# Patient Record
Sex: Female | Born: 1950
Health system: Southern US, Community
[De-identification: ages and names within clinical notes are randomized; demographics above are authoritative.]

## PROBLEM LIST (undated history)

## (undated) DIAGNOSIS — Z01818 Encounter for other preprocedural examination: Secondary | ICD-10-CM

## (undated) DIAGNOSIS — I10 Essential (primary) hypertension: Secondary | ICD-10-CM

## (undated) DIAGNOSIS — R05 Cough: Secondary | ICD-10-CM

## (undated) DIAGNOSIS — J45909 Unspecified asthma, uncomplicated: Secondary | ICD-10-CM

## (undated) DIAGNOSIS — M1990 Unspecified osteoarthritis, unspecified site: Secondary | ICD-10-CM

## (undated) DIAGNOSIS — K8020 Calculus of gallbladder without cholecystitis without obstruction: Secondary | ICD-10-CM

## (undated) DIAGNOSIS — E785 Hyperlipidemia, unspecified: Secondary | ICD-10-CM

## (undated) DIAGNOSIS — J189 Pneumonia, unspecified organism: Secondary | ICD-10-CM

## (undated) DIAGNOSIS — I639 Cerebral infarction, unspecified: Secondary | ICD-10-CM

## (undated) DIAGNOSIS — Z87442 Personal history of urinary calculi: Secondary | ICD-10-CM

## (undated) DIAGNOSIS — G479 Sleep disorder, unspecified: Secondary | ICD-10-CM

## (undated) HISTORY — DX: Hyperlipidemia, unspecified: E78.5

## (undated) HISTORY — DX: Encounter for other preprocedural examination: Z01.818

## (undated) HISTORY — DX: Unspecified asthma, uncomplicated: J45.909

## (undated) HISTORY — PX: OTHER SURGICAL HISTORY: SHX169

## (undated) HISTORY — PX: TUBAL LIGATION: SHX77

---

## 2001-08-13 ENCOUNTER — Emergency Department (HOSPITAL_COMMUNITY): Admission: EM | Admit: 2001-08-13 | Discharge: 2001-08-13 | Payer: Self-pay | Admitting: Emergency Medicine

## 2001-08-16 ENCOUNTER — Emergency Department (HOSPITAL_COMMUNITY): Admission: EM | Admit: 2001-08-16 | Discharge: 2001-08-16 | Payer: Self-pay | Admitting: Emergency Medicine

## 2002-08-26 ENCOUNTER — Inpatient Hospital Stay (HOSPITAL_COMMUNITY): Admission: AD | Admit: 2002-08-26 | Discharge: 2002-08-26 | Payer: Self-pay | Admitting: *Deleted

## 2003-03-02 ENCOUNTER — Ambulatory Visit (HOSPITAL_COMMUNITY): Admission: RE | Admit: 2003-03-02 | Discharge: 2003-03-02 | Payer: Self-pay | Admitting: Family Medicine

## 2003-08-02 ENCOUNTER — Emergency Department (HOSPITAL_COMMUNITY): Admission: EM | Admit: 2003-08-02 | Discharge: 2003-08-02 | Payer: Self-pay | Admitting: Emergency Medicine

## 2005-02-08 ENCOUNTER — Emergency Department (HOSPITAL_COMMUNITY): Admission: EM | Admit: 2005-02-08 | Discharge: 2005-02-09 | Payer: Self-pay | Admitting: Emergency Medicine

## 2005-12-03 ENCOUNTER — Emergency Department (HOSPITAL_COMMUNITY): Admission: EM | Admit: 2005-12-03 | Discharge: 2005-12-03 | Payer: Self-pay | Admitting: Emergency Medicine

## 2005-12-05 ENCOUNTER — Encounter (INDEPENDENT_AMBULATORY_CARE_PROVIDER_SITE_OTHER): Payer: Self-pay | Admitting: Nurse Practitioner

## 2005-12-07 DIAGNOSIS — IMO0002 Reserved for concepts with insufficient information to code with codable children: Secondary | ICD-10-CM | POA: Insufficient documentation

## 2005-12-18 ENCOUNTER — Encounter (INDEPENDENT_AMBULATORY_CARE_PROVIDER_SITE_OTHER): Payer: Self-pay | Admitting: Nurse Practitioner

## 2005-12-27 ENCOUNTER — Encounter (INDEPENDENT_AMBULATORY_CARE_PROVIDER_SITE_OTHER): Payer: Self-pay | Admitting: Nurse Practitioner

## 2007-06-03 ENCOUNTER — Emergency Department (HOSPITAL_COMMUNITY): Admission: EM | Admit: 2007-06-03 | Discharge: 2007-06-03 | Payer: Self-pay | Admitting: Emergency Medicine

## 2007-08-10 ENCOUNTER — Emergency Department (HOSPITAL_COMMUNITY): Admission: EM | Admit: 2007-08-10 | Discharge: 2007-08-10 | Payer: Self-pay | Admitting: Emergency Medicine

## 2008-02-07 HISTORY — PX: KNEE ARTHROSCOPY: SHX127

## 2008-04-16 ENCOUNTER — Emergency Department (HOSPITAL_COMMUNITY): Admission: EM | Admit: 2008-04-16 | Discharge: 2008-04-16 | Payer: Self-pay | Admitting: Family Medicine

## 2008-05-04 ENCOUNTER — Ambulatory Visit: Payer: Self-pay | Admitting: Nurse Practitioner

## 2008-05-04 DIAGNOSIS — R053 Chronic cough: Secondary | ICD-10-CM | POA: Insufficient documentation

## 2008-05-04 DIAGNOSIS — R05 Cough: Secondary | ICD-10-CM

## 2008-05-04 DIAGNOSIS — I10 Essential (primary) hypertension: Secondary | ICD-10-CM | POA: Insufficient documentation

## 2008-05-04 DIAGNOSIS — M25569 Pain in unspecified knee: Secondary | ICD-10-CM | POA: Insufficient documentation

## 2008-05-04 LAB — CONVERTED CEMR LAB: Blood Glucose, AC Bkfst: 95 mg/dL

## 2008-05-14 ENCOUNTER — Encounter (INDEPENDENT_AMBULATORY_CARE_PROVIDER_SITE_OTHER): Payer: Self-pay | Admitting: Nurse Practitioner

## 2008-05-18 ENCOUNTER — Encounter (INDEPENDENT_AMBULATORY_CARE_PROVIDER_SITE_OTHER): Payer: Self-pay | Admitting: Nurse Practitioner

## 2008-05-22 ENCOUNTER — Ambulatory Visit (HOSPITAL_COMMUNITY): Admission: RE | Admit: 2008-05-22 | Discharge: 2008-05-22 | Payer: Self-pay | Admitting: Nurse Practitioner

## 2008-05-22 DIAGNOSIS — M129 Arthropathy, unspecified: Secondary | ICD-10-CM | POA: Insufficient documentation

## 2008-06-04 ENCOUNTER — Encounter (INDEPENDENT_AMBULATORY_CARE_PROVIDER_SITE_OTHER): Payer: Self-pay | Admitting: Nurse Practitioner

## 2008-06-04 ENCOUNTER — Ambulatory Visit: Payer: Self-pay | Admitting: Nurse Practitioner

## 2008-06-04 DIAGNOSIS — B373 Candidiasis of vulva and vagina: Secondary | ICD-10-CM | POA: Insufficient documentation

## 2008-06-04 DIAGNOSIS — L919 Hypertrophic disorder of the skin, unspecified: Secondary | ICD-10-CM

## 2008-06-04 DIAGNOSIS — L909 Atrophic disorder of skin, unspecified: Secondary | ICD-10-CM | POA: Insufficient documentation

## 2008-06-04 LAB — CONVERTED CEMR LAB
ALT: 15 units/L (ref 0–35)
AST: 13 units/L (ref 0–37)
Blood in Urine, dipstick: NEGATIVE
CO2: 27 meq/L (ref 19–32)
Calcium: 9.3 mg/dL (ref 8.4–10.5)
Chloride: 104 meq/L (ref 96–112)
Cholesterol: 170 mg/dL (ref 0–200)
Creatinine, Ser: 0.66 mg/dL (ref 0.40–1.20)
HCT: 37.5 % (ref 36.0–46.0)
Hemoglobin: 11.9 g/dL — ABNORMAL LOW (ref 12.0–15.0)
MCHC: 31.7 g/dL (ref 30.0–36.0)
Monocytes Absolute: 0.5 10*3/uL (ref 0.1–1.0)
Nitrite: NEGATIVE
OCCULT 1: NEGATIVE
Potassium: 4.5 meq/L (ref 3.5–5.3)
Protein, U semiquant: NEGATIVE
RDW: 14.2 % (ref 11.5–15.5)
Sodium: 142 meq/L (ref 135–145)
TSH: 1.117 microintl units/mL (ref 0.350–4.500)
Total CHOL/HDL Ratio: 4.4
Total Protein: 7.6 g/dL (ref 6.0–8.3)
WBC Urine, dipstick: NEGATIVE

## 2008-06-08 ENCOUNTER — Encounter (INDEPENDENT_AMBULATORY_CARE_PROVIDER_SITE_OTHER): Payer: Self-pay | Admitting: Nurse Practitioner

## 2008-06-09 ENCOUNTER — Encounter (INDEPENDENT_AMBULATORY_CARE_PROVIDER_SITE_OTHER): Payer: Self-pay | Admitting: Nurse Practitioner

## 2008-06-10 ENCOUNTER — Ambulatory Visit (HOSPITAL_COMMUNITY): Admission: RE | Admit: 2008-06-10 | Discharge: 2008-06-10 | Payer: Self-pay | Admitting: Family Medicine

## 2008-06-10 ENCOUNTER — Encounter (INDEPENDENT_AMBULATORY_CARE_PROVIDER_SITE_OTHER): Payer: Self-pay | Admitting: Nurse Practitioner

## 2008-06-25 ENCOUNTER — Ambulatory Visit: Payer: Self-pay | Admitting: Nurse Practitioner

## 2008-07-16 ENCOUNTER — Ambulatory Visit: Payer: Self-pay | Admitting: Nurse Practitioner

## 2008-07-30 ENCOUNTER — Ambulatory Visit: Payer: Self-pay | Admitting: Nurse Practitioner

## 2008-08-13 ENCOUNTER — Ambulatory Visit: Payer: Self-pay | Admitting: Nurse Practitioner

## 2008-08-27 ENCOUNTER — Telehealth (INDEPENDENT_AMBULATORY_CARE_PROVIDER_SITE_OTHER): Payer: Self-pay | Admitting: Nurse Practitioner

## 2008-09-08 ENCOUNTER — Encounter (INDEPENDENT_AMBULATORY_CARE_PROVIDER_SITE_OTHER): Payer: Self-pay | Admitting: Nurse Practitioner

## 2008-09-09 ENCOUNTER — Ambulatory Visit (HOSPITAL_COMMUNITY): Admission: RE | Admit: 2008-09-09 | Discharge: 2008-09-09 | Payer: Self-pay | Admitting: Gastroenterology

## 2008-09-10 ENCOUNTER — Ambulatory Visit: Payer: Self-pay | Admitting: Nurse Practitioner

## 2008-11-08 ENCOUNTER — Emergency Department (HOSPITAL_COMMUNITY): Admission: EM | Admit: 2008-11-08 | Discharge: 2008-11-08 | Payer: Self-pay | Admitting: Emergency Medicine

## 2009-03-09 ENCOUNTER — Ambulatory Visit: Payer: Self-pay | Admitting: Nurse Practitioner

## 2009-03-09 DIAGNOSIS — K029 Dental caries, unspecified: Secondary | ICD-10-CM | POA: Insufficient documentation

## 2009-03-09 DIAGNOSIS — J4540 Moderate persistent asthma, uncomplicated: Secondary | ICD-10-CM | POA: Insufficient documentation

## 2009-03-09 LAB — CONVERTED CEMR LAB
HDL goal, serum: 40 mg/dL
LDL Goal: 130 mg/dL

## 2009-03-25 ENCOUNTER — Emergency Department (HOSPITAL_COMMUNITY): Admission: EM | Admit: 2009-03-25 | Discharge: 2009-03-25 | Payer: Self-pay | Admitting: Emergency Medicine

## 2009-06-14 ENCOUNTER — Ambulatory Visit (HOSPITAL_COMMUNITY): Admission: RE | Admit: 2009-06-14 | Discharge: 2009-06-14 | Payer: Self-pay | Admitting: Internal Medicine

## 2009-07-23 ENCOUNTER — Other Ambulatory Visit: Admission: RE | Admit: 2009-07-23 | Discharge: 2009-07-23 | Payer: Self-pay | Admitting: Internal Medicine

## 2009-07-23 ENCOUNTER — Ambulatory Visit: Payer: Self-pay | Admitting: Nurse Practitioner

## 2009-07-23 LAB — CONVERTED CEMR LAB
Blood in Urine, dipstick: NEGATIVE
Ketones, urine, test strip: NEGATIVE
Nitrite: NEGATIVE
OCCULT 1: NEGATIVE
Urobilinogen, UA: 0.2
WBC Urine, dipstick: NEGATIVE

## 2009-07-26 DIAGNOSIS — D649 Anemia, unspecified: Secondary | ICD-10-CM | POA: Insufficient documentation

## 2009-07-26 LAB — CONVERTED CEMR LAB
ALT: 20 units/L (ref 0–35)
Albumin: 4.1 g/dL (ref 3.5–5.2)
CO2: 25 meq/L (ref 19–32)
Calcium: 8.6 mg/dL (ref 8.4–10.5)
Chlamydia, DNA Probe: NEGATIVE
Chloride: 106 meq/L (ref 96–112)
Cholesterol: 177 mg/dL (ref 0–200)
Hemoglobin: 11.3 g/dL — ABNORMAL LOW (ref 12.0–15.0)
Lymphs Abs: 2.7 10*3/uL (ref 0.7–4.0)
Microalb, Ur: 1.05 mg/dL (ref 0.00–1.89)
Monocytes Absolute: 0.4 10*3/uL (ref 0.1–1.0)
Monocytes Relative: 9 % (ref 3–12)
Neutro Abs: 1.1 10*3/uL — ABNORMAL LOW (ref 1.7–7.7)
Neutrophils Relative %: 25 % — ABNORMAL LOW (ref 43–77)
Potassium: 3.9 meq/L (ref 3.5–5.3)
RBC: 4.06 M/uL (ref 3.87–5.11)
Sodium: 142 meq/L (ref 135–145)
Total Protein: 7.3 g/dL (ref 6.0–8.3)
VLDL: 18 mg/dL (ref 0–40)
WBC: 4.5 10*3/uL (ref 4.0–10.5)

## 2009-07-27 ENCOUNTER — Encounter (INDEPENDENT_AMBULATORY_CARE_PROVIDER_SITE_OTHER): Payer: Self-pay | Admitting: Nurse Practitioner

## 2009-07-27 LAB — CONVERTED CEMR LAB: Retic Ct Pct: 1.2 % (ref 0.4–3.1)

## 2009-07-28 ENCOUNTER — Encounter (INDEPENDENT_AMBULATORY_CARE_PROVIDER_SITE_OTHER): Payer: Self-pay | Admitting: Nurse Practitioner

## 2009-07-28 ENCOUNTER — Telehealth (INDEPENDENT_AMBULATORY_CARE_PROVIDER_SITE_OTHER): Payer: Self-pay | Admitting: Nurse Practitioner

## 2009-07-29 LAB — CONVERTED CEMR LAB: Pap Smear: NEGATIVE

## 2009-08-21 ENCOUNTER — Emergency Department (HOSPITAL_COMMUNITY): Admission: EM | Admit: 2009-08-21 | Discharge: 2009-08-21 | Payer: Self-pay | Admitting: Emergency Medicine

## 2010-01-26 ENCOUNTER — Emergency Department (HOSPITAL_COMMUNITY)
Admission: EM | Admit: 2010-01-26 | Discharge: 2010-01-26 | Payer: Self-pay | Source: Home / Self Care | Admitting: Emergency Medicine

## 2010-03-08 NOTE — Assessment & Plan Note (Signed)
Summary: Dental Caries/HTN   Vital Signs:  Patient profile:   60 year old female Menstrual status:  perimenopausal Weight:      233.7 pounds BMI:     40.26 BSA:     2.09 O2 Sat:      98 % Temp:     97.8 degrees F oral Pulse rate:   77 / minute Pulse rhythm:   regular Resp:     20 per minute BP sitting:   168 / 100  (left arm) Cuff size:   large  Vitals Entered By: Levon Hedger (March 09, 2009 9:01 AM)  Serial Vital Signs/Assessments:  Comments: 9:18 AM P/F  300,  300,  330 By: Levon Hedger   CC: needs referral to dentist...swelling under chin from teeth, Hypertension Management, Lipid Management Is Patient Diabetic? No Pain Assessment Patient in pain? no       Does patient need assistance? Functional Status Self care Ambulation Normal Comments pt has been without BP medication for a month.  She says that it makes her cough really bad.     Menstrual Status perimenopausal Last PAP Result  Specimen Adequacy: Satisfactory for evaluation.   Interpretation/Result:Negative for intraepithelial Lesion or Malignancy.   Interpretation/Result:Atrophy.      CC:  needs referral to dentist...swelling under chin from teeth, Hypertension Management, and Lipid Management.  History of Present Illness:  Pt into the office with complaints of tooth pain. She has multiple teeth that need to be extracted.  Upper plate already in place. -headache +sensitivity to hot and cold foods +tender gums  +swelling of jaw -fever eating is difficult due to poor dentation.  Asthma History    Initial Asthma Severity Rating:    Age range: 12+ years    Symptoms: 0-2 days/week    Nighttime Awakenings: 0-2/month    Interferes w/ normal activity: no limitations    SABA use (not for EIB): 0-2 days/week    Exacerbations requiring oral systemic steroids: 0-1/year    Asthma Severity Assessment: Intermittent  Hypertension History:      She denies headache, chest pain, and  palpitations.  She notes no problems with any antihypertensive medication side effects.  Pt has not had her meds for the past month.        Positive major cardiovascular risk factors include female age 11 years old or older and hypertension.  Negative major cardiovascular risk factors include non-tobacco-user status.        Further assessment for target organ damage reveals no history of ASHD, cardiac end-organ damage (CHF/LVH), stroke/TIA, peripheral vascular disease, renal insufficiency, or hypertensive retinopathy.    Lipid Management History:      Positive NCEP/ATP III risk factors include female age 72 years old or older, HDL cholesterol less than 40, and hypertension.  Negative NCEP/ATP III risk factors include non-tobacco-user status, no ASHD (atherosclerotic heart disease), no prior stroke/TIA, and no peripheral vascular disease.       Allergies (verified): No Known Drug Allergies  Review of Systems General:  +dental pain. CV:  Denies chest pain or discomfort. Resp:  Complains of cough; mostly at night for which she uses her inhaler.  Not daily use of the inhaler.Marland Kitchen GI:  Denies abdominal pain, nausea, and vomiting.  Physical Exam  General:  alert.   Head:  normocephalic.   Eyes:  pupils round.   Mouth:  poor dentation - lower multiple missing teeth and remaining with caries upper plate Lungs:  normal breath sounds.   Heart:  normal  rate and regular rhythm.   Abdomen:  normal bowel sounds.   Msk:  up to the exam table Neurologic:  alert & oriented X3.   Cervical Nodes:  submandibular LAD Psych:  Oriented X3.     Impression & Recommendations:  Problem # 1:  DENTAL CARIES (ICD-521.00) will refer to dental clinic will start antibiotics advised pt that she needs the teeth extracted Orders: Dental Referral (Dentist)  Problem # 2:  HYPERTENSION, BENIGN ESSENTIAL (ICD-401.1) BP elevated but pt is not taking meds Will restart meds DASH diet Her updated medication list  for this problem includes:    Hyzaar 50-12.5 Mg Tabs (Losartan potassium-hctz) .Marland Kitchen... 1 tab by mouth every morning for blood pressure  Problem # 3:  REACTIVE AIRWAY DISEASE (ICD-493.90)  The following medications were removed from the medication list:    Advair Hfa 45-21 Mcg/act Aero (Fluticasone-salmeterol) .Marland Kitchen... 1 puff two times a day Her updated medication list for this problem includes:    Ventolin Hfa 108 (90 Base) Mcg/act Aers (Albuterol sulfate) .Marland Kitchen... 2 puffs every 6 hours as needed for cough  Orders: Peak Flow Rate (94150) Pulse Oximetry (single measurment) (94760) CPE due  Complete Medication List: 1)  Fish Oil 1000 Mg Caps (Omega-3 fatty acids) .... Take one pill three times a day 2)  Osteo Bi-flex Adv Triple St Tabs (Misc natural products) .... Take one pill three times a day 3)  Diclofenac Sodium 75 Mg Tbec (Diclofenac sodium) .... One tablet by mouth two times a day for inflammation 4)  Ventolin Hfa 108 (90 Base) Mcg/act Aers (Albuterol sulfate) .... 2 puffs every 6 hours as needed for cough 5)  Hyzaar 50-12.5 Mg Tabs (Losartan potassium-hctz) .Marland Kitchen.. 1 tab by mouth every morning for blood pressure 6)  Amoxicillin 500 Mg Caps (Amoxicillin) .... One capsule by mouth three times a day for infection  Asthma Management Plan    Asthma Severity: Intermittent    Personal best PEF: 300 liters/minute    Predicted PEF: 443 liters/minute    Working PEF: 330 liters/minute    Plan based on PEF formula: [place] [employer] Zone: (Range: 260 to 330)  VENTOLIN HFA 108 (90 BASE) MCG/ACT AERS:  2 puffs every 4 hours as needed  Yellow Zone:  Red Zone:  Hypertension Assessment/Plan:      The patient's hypertensive risk group is category B: At least one risk factor (excluding diabetes) with no target organ damage.  Her calculated 10 year risk of coronary heart disease is 17 %.  Today's blood pressure is 168/100.  Her blood pressure goal is < 140/90.  Lipid Assessment/Plan:      Based  on NCEP/ATP III, the patient's risk factor category is "2 or more risk factors and a calculated 10 year CAD risk of < 20%".  The patient's lipid goals are as follows: Total cholesterol goal is 200; LDL cholesterol goal is 130; HDL cholesterol goal is 40; Triglyceride goal is 150.     Patient Instructions: 1)  You need to restart your blood pressure medications. 2)  You will be referred to the dental clinic. 3)  Take antibiotics as ordered three times a day with food 4)  You have declined the flu vaccine today 5)  Follow up as needed Prescriptions: AMOXICILLIN 500 MG CAPS (AMOXICILLIN) One capsule by mouth three times a day for infection  #30 x 0   Entered and Authorized by:   Lehman Prom FNP   Signed by:   Lehman Prom FNP on  03/09/2009   Method used:   Faxed to ...       [facility] (retail)       [address] [place], [place]  [address]       Ph: [phone]       Fax: [phone]   RxID:   [id number] HYZAAR 50-12.5 MG TABS (LOSARTAN POTASSIUM-HCTZ) 1 tab by mouth every morning for blood pressure  #30 x 5   Entered and Authorized by:   Lehman Prom FNP   Signed by:   Lehman Prom FNP on 03/09/2009   Method used:   Faxed to ...       [facility] (retail)       [address] [place], [place]  [address]       Ph: [phone]       Fax: [phone]   RxID:   [id number]

## 2010-03-08 NOTE — Progress Notes (Signed)
Summary: Office Visit//DEPRESSION SCREENING  Office Visit//DEPRESSION SCREENING   Imported By: Arta Bruce 07/23/2009 12:03:32  _____________________________________________________________________  External Attachment:    Type:   Image     Comment:   External Document

## 2010-03-08 NOTE — Letter (Signed)
Summary: DENTAL REFERRAL  DENTAL REFERRAL   Imported By: Arta Bruce 04/27/2009 15:26:02  _____________________________________________________________________  External Attachment:    Type:   Image     Comment:   External Document

## 2010-03-08 NOTE — Assessment & Plan Note (Signed)
Summary: Complete Physical Exam   Vital Signs:  Patient profile:   60 year old female Menstrual status:  perimenopausal Height:      63.25 inches Weight:      232.1 pounds BMI:     40.94 Temp:     98.1 degrees F oral Pulse rate:   84 / minute Pulse rhythm:   regular Resp:     20 per minute BP sitting:   136 / 88  (left arm) Cuff size:   large  Vitals Entered By: Levon Hedger (July 23, 2009 10:25 AM) CC: CPP....pt states she has not taken her BP meds in 3 months because she already has a cough and she thinks that her BP meds are causing her to have a worse cough, Hypertension Management Pain Assessment Patient in pain? no       Does patient need assistance? Functional Status Self care Ambulation Normal   CC:  CPP....pt states she has not taken her BP meds in 3 months because she already has a cough and she thinks that her BP meds are causing her to have a worse cough and Hypertension Management.  History of Present Illness:  Pt into the office for a complete physical exam  PAP - last done 1 year ago no family history of cervical or ovarian cancer Post menopausal x 3 years  Mammogram - last mammogram done was 06/14/2009 no family history of breast cancer  Optho - Wears reading glasses from the [employer]. No recent eye exam  Dental - pt went has gone to the dental clinic and she has lower teeth extracted.  upper dentures in place  Colonscopy - father with hx of colon cancer Pt s/p colonscopy on last year  Hypertension History:      She denies headache, chest pain, and palpitations.  She notes no problems with any antihypertensive medication side effects.  Pt is NOT taking her blood pressure medications. She was changed from lisinopril to cozaar but pt has NOT started taking it.  Pharmacy review indicates that pt has not picked up the refills.        Positive major cardiovascular risk factors include female age 18 years old or older and hypertension.  Negative  major cardiovascular risk factors include non-tobacco-user status.        Further assessment for target organ damage reveals no history of ASHD, cardiac end-organ damage (CHF/LVH), stroke/TIA, peripheral vascular disease, renal insufficiency, or hypertensive retinopathy.     Habits & Providers  Alcohol-Tobacco-Diet     Alcohol drinks/day: 0     Tobacco Status: quit > 6 months     Tobacco Counseling: to remain off tobacco products     Year Quit: 1995  Exercise-Depression-Behavior     Does Patient Exercise: no     Exercise Counseling: to improve exercise regimen     Type of exercise: walking     Have you felt down or hopeless? no     Have you felt little pleasure in things? no     Depression Counseling: not indicated; screening negative for depression     Drug Use: never  Comments: PHQ-9 score = 12  Allergies (verified): No Known Drug Allergies  Social History: Does Patient Exercise:  no  Review of Systems General:  Denies fever. Eyes:  Denies discharge. ENT:  Denies earache. CV:  Denies chest pain or discomfort. Resp:  Complains of cough. GI:  Complains of indigestion. GU:  Denies dysuria. MS:  Complains of joint pain; intermittent  knee pain. Derm:  Denies rash. Neuro:  Denies headaches. Psych:  Denies anxiety and depression.  Physical Exam  General:  alert.   Head:  normocephalic.   Eyes:  pupils equal, pupils round, and pupils reactive to light.   Ears:  Bil TM with bony landmarks visible Nose:  no nasal discharge.   Mouth:  lower teeth extraction upper dentures Neck:  supple.   Chest Wall:  no mass.   Breasts:  no masses and no abnormal thickening.   Lungs:  normal breath sounds.   Heart:  normal rate and regular rhythm.   Abdomen:  soft, non-tender, and normal bowel sounds.   Rectal:  external hemorrhoid(s).   Msk:  up to the exam table Extremities:  no edema Neurologic:  alert & oriented X3.   Skin:  left lower bag - pendulous skin tag normal skin  color Psych:  Oriented X3.    Pelvic Exam  Vulva:      normal appearance.   Urethra and Bladder:      Urethra--normal.   Vagina:      physiologic discharge.   Cervix:      midposition.   Uterus:      smooth.   Adnexa:      nontender bilaterally.   Rectum:      heme negative stool.      Impression & Recommendations:  Problem # 1:  ROUTINE GYNECOLOGICAL EXAMINATION (ICD-V72.31)  PAP done labs done EKG done PHQ-9 score = 12 rec optho and dental exam Orders: T- GC Chlamydia (29562) KOH/ WET Mount [phone]) Pap Smear, Thin Prep ( Collection of) (V7846) Rapid HIV  (96295) T-TSH (28413-24401)  Problem # 2:  OTHER SCREENING BREAST EXAMINATION (ICD-V76.19) self breast exams reviewed mammogram up to date  Problem # 3:  COUGH (ICD-786.2)  Problem # 4:  HYPERTENSION, BENIGN ESSENTIAL (ICD-401.1) BP is stable DASH diet Her updated medication list for this problem includes:    Hyzaar 50-12.5 Mg Tabs (Losartan potassium-hctz) .Marland Kitchen... 1 tab by mouth every morning for blood pressure  Orders: EKG w/ Interpretation (93000) UA Dipstick w/o Micro (manual) (02725) T-Urine Microalbumin w/creat. ratio [phone]) T-Lipid Profile [phone]) T-Comprehensive Metabolic Panel [phone]) T-CBC w/Diff (63016-01093)  Complete Medication List: 1)  Fish Oil 1000 Mg Caps (Omega-3 fatty acids) .... Take one pill three times a day 2)  Osteo Bi-flex Adv Triple St Tabs (Misc natural products) .... Take one pill three times a day 3)  Diclofenac Sodium 75 Mg Tbec (Diclofenac sodium) .... One tablet by mouth two times a day for inflammation 4)  Ventolin Hfa 108 (90 Base) Mcg/act Aers (Albuterol sulfate) .... 2 puffs every 6 hours as needed for cough 5)  Hyzaar 50-12.5 Mg Tabs (Losartan potassium-hctz) .Marland Kitchen.. 1 tab by mouth every morning for blood pressure  Hypertension Assessment/Plan:      The patient's hypertensive risk group is category B: At least one risk factor (excluding  diabetes) with no target organ damage.  Her calculated 10 year risk of coronary heart disease is 11 %.  Today's blood pressure is 136/88.  Her blood pressure goal is < 140/90.  Patient Instructions: 1)  Free vision screening  2)  June 22nd 9AM - 3pm 3)  [place] [facility] 4)  Blood pressure -still high. Medications are available at the pharmacy.  Remember high blood pressure is harmful to your heart and circulation 5)  You will be notified of any abnormal labs 6)  Follow up in 6 months for blood pressure or  sooner if necessary   Osteoporosis  Patient complains of: kyphosis No back pain No prior fracture No height loss No  Patient reports: Personal history of fracture No Hx of Fx in a 1st degree relative No Caucasian/Asian Race No Advanced Age No Female Gender Yes Dementia No Poor health/fragility No Current cigarette smoker No Low body weight (<127 lbs) No Estrogen deficiency Yes Low calcium intake (lifelong) No Alcoholism No Inadequate physical activity No Poor eyesight/risk of falls No   Laboratory Results   Urine Tests  Date/Time Received: July 23, 2009 11:55 AM   Routine Urinalysis   Color: yellow Appearance: Cloudy Glucose: negative   (Normal Range: Negative) Bilirubin: negative   (Normal Range: Negative) Ketone: negative   (Normal Range: Negative) Spec. Gravity: >=1.030   (Normal Range: 1.003-1.035) Blood: negative   (Normal Range: Negative) pH: 5.0   (Normal Range: 5.0-8.0) Protein: negative   (Normal Range: Negative) Urobilinogen: 0.2   (Normal Range: 0-1) Nitrite: negative   (Normal Range: Negative) Leukocyte Esterace: negative   (Normal Range: Negative)    Date/Time Received: July 23, 2009 5:44 PM   Wet Mount Source: vaginal WBC/hpf: 1-5 Bacteria/hpf: rare Clue cells/hpf: none Yeast/hpf: none Wet [place] KOH: Negative Trichomonas/hpf: none  Stool - Occult Blood Hemmoccult #1: negative Date: 07/23/2009   Laboratory Results   Urine  Tests    Routine Urinalysis   Color: yellow Appearance: Cloudy Glucose: negative   (Normal Range: Negative) Bilirubin: negative   (Normal Range: Negative) Ketone: negative   (Normal Range: Negative) Spec. Gravity: >=1.030   (Normal Range: 1.003-1.035) Blood: negative   (Normal Range: Negative) pH: 5.0   (Normal Range: 5.0-8.0) Protein: negative   (Normal Range: Negative) Urobilinogen: 0.2   (Normal Range: 0-1) Nitrite: negative   (Normal Range: Negative) Leukocyte Esterace: negative   (Normal Range: Negative)      Wet Mount/KOH    Appended Document: Complete Physical Exam     Allergies: No Known Drug Allergies   Complete Medication List: 1)  Fish Oil 1000 Mg Caps (Omega-3 fatty acids) .... Take one pill three times a day 2)  Osteo Bi-flex Adv Triple St Tabs (Misc natural products) .... Take one pill three times a day 3)  Diclofenac Sodium 75 Mg Tbec (Diclofenac sodium) .... One tablet by mouth two times a day for inflammation 4)  Ventolin Hfa 108 (90 Base) Mcg/act Aers (Albuterol sulfate) .... 2 puffs every 6 hours as needed for cough 5)  Hyzaar 50-12.5 Mg Tabs (Losartan potassium-hctz) .Marland Kitchen.. 1 tab by mouth every morning for blood pressure   Laboratory Results  Date/Time Received: July 27, 2009 10:51 AM   Other Tests  Rapid HIV: negative

## 2010-03-08 NOTE — Progress Notes (Signed)
Summary: New medication  Phone Note Outgoing Call   Summary of Call: notify pt that provider meant to order something for her stomach/indigestion for her to take and see if cough improves i spoke with her during visit that reflux may make her cough worse would like to order ranitidine 150mg  by mouth two times a day before breakfast and before bedtime - take for at least 2 full weeks to and assess cough (rx in basket - $4 at [facility]) avouid tomato based foods, small meals, and acidic foods also if cough improves then she will know cough was not due to BP meds but reflux Initial call taken by: Lehman Prom FNP,  July 28, 2009 8:22 AM  Follow-up for Phone Call        Levon Hedger  July 28, 2009 10:25 AM Left message on machine for pt to return call to the office.  Additional Follow-up for Phone Call Additional follow up Details #1::        Spoke with pt. and advised of provider's recommendations and new faxed Rx.  Instructed as to diet and medication.  Dutch Quint RN  July 29, 2009 4:46 PM     New/Updated Medications: RANITIDINE HCL 150 MG TABS (RANITIDINE HCL) One tablet by mouth two times a day before breakfast and bedtime Prescriptions: RANITIDINE HCL 150 MG TABS (RANITIDINE HCL) One tablet by mouth two times a day before breakfast and bedtime  #60 x 3   Entered and Authorized by:   Lehman Prom FNP   Signed by:   Lehman Prom FNP on 07/28/2009   Method used:   Printed then faxed to ...       [facility] (retail)       [address] [place], [place]  [address]       Ph: [phone]       Fax: [phone]   RxID:   [id number]

## 2010-03-08 NOTE — Letter (Signed)
Summary: *HSN Results Follow up  [facility]  [address] [place], [place] [address]   Phone: [phone]  Fax: [phone]      07/28/2009   DILARA NAVARRETE Loflin [address] [place], [place]  [address]   Dear  Ms. Sharol Given,                            ____S.Drinkard,FNP   ____D. Gore,FNP       ____B. McPherson,MD   ____V. Rankins,MD    ____E. Mulberry,MD    _X___N. Daphine Deutscher, FNP  ____D. Reche Dixon, MD    ____K. Philipp Deputy, MD    ____Other     This letter is to inform you that your recent test(s):  _______Pap Smear    ___X____Lab Test     _______X-ray    ___X____ is within acceptable limits  _______ requires a medication change  _______ requires a follow-up lab visit  _______ requires a follow-up visit with your provider   Comments: Labs done during recent office visit shows that you are slightly anemic.  You should start taking a multivitamin daily. You can buy this over the counter.  Pap Smear results ___________________________.      _________________________________________________________ If you have any questions, please contact our office [phone].                    Sincerely,    Lehman Prom FNP [facility]

## 2010-03-28 ENCOUNTER — Encounter (INDEPENDENT_AMBULATORY_CARE_PROVIDER_SITE_OTHER): Payer: Self-pay | Admitting: Nurse Practitioner

## 2010-03-28 ENCOUNTER — Encounter: Payer: Self-pay | Admitting: Nurse Practitioner

## 2010-03-28 DIAGNOSIS — M129 Arthropathy, unspecified: Secondary | ICD-10-CM | POA: Insufficient documentation

## 2010-03-28 DIAGNOSIS — M654 Radial styloid tenosynovitis [de Quervain]: Secondary | ICD-10-CM | POA: Insufficient documentation

## 2010-03-28 LAB — CONVERTED CEMR LAB
Nitrite: NEGATIVE
WBC Urine, dipstick: NEGATIVE

## 2010-04-05 NOTE — Letter (Signed)
Summary: Handout Printed  Printed Handout:  Suzette Battiest Disease

## 2010-04-05 NOTE — Assessment & Plan Note (Signed)
Summary: HTN   Vital Signs:  Patient profile:   60 year old female Menstrual status:  perimenopausal Weight:      231.31 pounds Temp:     97.6 degrees F oral Pulse rate:   84 / minute Pulse rhythm:   regular Resp:     18 per minute BP sitting:   134 / 74  (left arm) Cuff size:   regular  Vitals Entered By: Hale Drone CMA (March 28, 2010 3:54 PM) CC: 6 month f/u from 6/11. Left wrist sore since yesterday. , Hypertension Management Is Patient Diabetic? No Pain Assessment Patient in pain? yes       Does patient need assistance? Functional Status Self care Ambulation Normal   CC:  6 month f/u from 6/11. Left wrist sore since yesterday.  and Hypertension Management.  History of Present Illness:  Pt into the office for f/u on htn.  Social - pt is employed doing some light housekeeping.  This has improved her spirits as she did not like sitting at home  Hypertension History:      She denies headache, chest pain, and palpitations.  She notes no problems with any antihypertensive medication side effects.        Positive major cardiovascular risk factors include female age 64 years old or older and hypertension.  Negative major cardiovascular risk factors include non-tobacco-user status.        Further assessment for target organ damage reveals no history of ASHD, cardiac end-organ damage (CHF/LVH), stroke/TIA, peripheral vascular disease, renal insufficiency, or hypertensive retinopathy.     Habits & Providers  Alcohol-Tobacco-Diet     Alcohol drinks/day: 0     Tobacco Status: quit > 6 months     Tobacco Counseling: to remain off tobacco products     Year Quit: 1995  Exercise-Depression-Behavior     Does Patient Exercise: no     Exercise Counseling: to improve exercise regimen     Type of exercise: walking     Depression Counseling: not indicated; screening negative for depression     Drug Use: never  Current Medications (verified): 1)  Fish Oil 1000 Mg Caps  (Omega-3 Fatty Acids) .... Take One Pill Three Times A Day 2)  Osteo Bi-Flex Adv Triple St  Tabs (Misc Natural Products) .... Take One Pill Three Times A Day 3)  Diclofenac Sodium 75 Mg Tbec (Diclofenac Sodium) .... One Tablet By Mouth Two Times A Day For Inflammation 4)  Ventolin Hfa 108 (90 Base) Mcg/act Aers (Albuterol Sulfate) .... 2 Puffs Every 6 Hours As Needed For Cough 5)  Hyzaar 50-12.5 Mg Tabs (Losartan Potassium-Hctz) .Marland Kitchen.. 1 Tab By Mouth Every Morning For Blood Pressure 6)  Ranitidine Hcl 150 Mg Tabs (Ranitidine Hcl) .... One Tablet By Mouth Two Times A Day Before Breakfast and Bedtime  Allergies (verified): No Known Drug Allergies  Review of Systems General:  Denies chills. CV:  Denies chest pain or discomfort. Resp:  Denies cough. GI:  Denies abdominal pain, nausea, and vomiting. MS:  Complains of joint pain and stiffness; left wrist pain that started 1 days ago - denies any trauma. Intermittent, recurrent.  Pt is right hand dominant.  she uses her left hand to use the spray bottle. stiffness especially in the morning.  Physical Exam  General:  alert.   Head:  normocephalic.   Lungs:  normal breath sounds.   Heart:  normal rate and regular rhythm.   Abdomen:  normal bowel sounds.   Msk:  up  to the exam table Neurologic:  alert & oriented X3.   Skin:  color normal.   Psych:  Oriented X3.     Wrist/Hand Exam  Wrist Exam:    Left:    Inspection:  Normal    Palpation:  Abnormal       Location:  left radial head    Stability:  stable    Tenderness:  left radial head    Swelling:  no    Erythema:  no  Hand Exam:    Left:    Tenderness:  5th metacarpal    Swelling:  no    Erythema:  no   Impression & Recommendations:  Problem # 1:  HYPERTENSION, BENIGN ESSENTIAL (ICD-401.1) BP stable  continue current meds Her updated medication list for this problem includes:    Hyzaar 50-12.5 Mg Tabs (Losartan potassium-hctz) .Marland Kitchen... 1 tab by mouth every morning for  blood pressure  Orders: UA Dipstick w/o Micro (automated)  (81003)  Problem # 2:  DE QUERVAIN'S TENOSYNOVITIS (ICD-727.04) likely due to work handout given  advised pt that she can splint  Problem # 3:  ARTHRITIS (ICD-716.90) handout given pt to take tylenol PM or aleve for symptoms  Problem # 4:  ANEMIA (ICD-285.9) pt to take supplement as ordered Her updated medication list for this problem includes:    Ferrous Sulfate 325 (65 Fe) Mg Tbec (Ferrous sulfate) ..... One tablet by mouth daily to build up blood  Problem # 5:  NEED PROPHYLACTIC VACCINATION&INOCULATION FLU (ICD-V04.81)  Orders: Flu Vaccine 37yrs + (16109)  Complete Medication List: 1)  Fish Oil 1000 Mg Caps (Omega-3 fatty acids) .... Take one pill three times a day 2)  Osteo Bi-flex Adv Triple St Tabs (Misc natural products) .... Take one pill three times a day 3)  Ventolin Hfa 108 (90 Base) Mcg/act Aers (Albuterol sulfate) .... 2 puffs every 6 hours as needed for cough 4)  Hyzaar 50-12.5 Mg Tabs (Losartan potassium-hctz) .Marland Kitchen.. 1 tab by mouth every morning for blood pressure 5)  Ranitidine Hcl 150 Mg Tabs (Ranitidine hcl) .... One tablet by mouth two times a day before breakfast and bedtime 6)  Ferrous Sulfate 325 (65 Fe) Mg Tbec (Ferrous sulfate) .... One tablet by mouth daily to build up blood  Other Orders: Admin 1st Vaccine (60454)  Hypertension Assessment/Plan:      The patient's hypertensive risk group is category B: At least one risk factor (excluding diabetes) with no target organ damage.  Her calculated 10 year risk of coronary heart disease is 13 %.  Today's blood pressure is 134/74.  Her blood pressure goal is < 140/90.  Patient Instructions: 1)  Blood pressure - doing well today.  Prescription given for you to take the pharmacy 2)  Left thumb - read the handout 3)  likely overuse of a tendon.  you can purchase a wrist splint to wear at night to give you some support 4)  Arthritis - read the handout 5)   You may take Tylenol arthritis OR aleve daily for arthritis.  You should eat food before taking these medications.  Remember that activity actually helps your joints that is why you are so still in the morning. 6)  Folllow up in 6 months for a complete physical exam 7)  Come fasting for labs.  No food after midnight before this visit. Prescriptions: HYZAAR 50-12.5 MG TABS (LOSARTAN POTASSIUM-HCTZ) 1 tab by mouth every morning for blood pressure  #30 x 5   Entered and  Authorized by:   Lehman Prom FNP   Signed by:   Lehman Prom FNP on 03/28/2010   Method used:   Print then Give to Patient   RxID:   [id number]    Orders Added: 1)  Flu Vaccine 61yrs + [78469] 2)  Admin 1st Vaccine [90471] 3)  UA Dipstick w/o Micro (automated)  [81003] 4)  Est. Patient Level III [62952]   Immunizations Administered:  Influenza Vaccine # 1:    Vaccine Type: Fluvax 3+    Site: left deltoid    Mfr: GlaxoSmithKline    Dose: 0.5 ml    Route: IM    Given by: Hale Drone CMA    Exp. Date: 08/06/2010    Lot #: [id number]    VIS given: 08/31/09 version given March 28, 2010.  Flu Vaccine Consent Questions:    Do you have a history of severe allergic reactions to this vaccine? no    Any prior history of allergic reactions to egg and/or gelatin? no    Do you have a sensitivity to the preservative Thimersol? no    Do you have a past history of Guillan-Barre Syndrome? no    Do you currently have an acute febrile illness? no    Have you ever had a severe reaction to latex? no    Vaccine information given and explained to patient? yes    Are you currently pregnant? no   Immunizations Administered:  Influenza Vaccine # 1:    Vaccine Type: Fluvax 3+    Site: left deltoid    Mfr: GlaxoSmithKline    Dose: 0.5 ml    Route: IM    Given by: Hale Drone CMA    Exp. Date: 08/06/2010    Lot #: [id number]    VIS given: 08/31/09 version given March 28, 2010.   Laboratory Results   Urine  Tests  Date/Time Received: March 28, 2010 4:10 PM   Routine Urinalysis   Glucose: negative   (Normal Range: Negative) Bilirubin: negative   (Normal Range: Negative) Ketone: negative   (Normal Range: Negative) Spec. Gravity: 1.020   (Normal Range: 1.003-1.035) Blood: trace-intact   (Normal Range: Negative) pH: 5.5   (Normal Range: 5.0-8.0) Protein: negative   (Normal Range: Negative) Urobilinogen: 0.2   (Normal Range: 0-1) Nitrite: negative   (Normal Range: Negative) Leukocyte Esterace: negative   (Normal Range: Negative)

## 2010-04-05 NOTE — Letter (Signed)
Summary: Handout Printed  Printed Handout:  - Arthritis - Degenerative, Brief 

## 2010-04-23 LAB — GLUCOSE, CAPILLARY: Glucose-Capillary: 130 mg/dL — ABNORMAL HIGH (ref 70–99)

## 2010-06-05 ENCOUNTER — Emergency Department (HOSPITAL_COMMUNITY): Payer: Medicare Other

## 2010-06-05 ENCOUNTER — Emergency Department (HOSPITAL_COMMUNITY)
Admission: EM | Admit: 2010-06-05 | Discharge: 2010-06-05 | Disposition: A | Discharge status: Home / Self Care | Payer: Medicare Other | Attending: Emergency Medicine | Admitting: Emergency Medicine

## 2010-06-05 DIAGNOSIS — R0789 Other chest pain: Secondary | ICD-10-CM | POA: Insufficient documentation

## 2010-06-05 DIAGNOSIS — I10 Essential (primary) hypertension: Secondary | ICD-10-CM | POA: Insufficient documentation

## 2010-06-21 NOTE — Op Note (Signed)
NAME:  [name], [name]                  ACCOUNT NO.:  [id number]   MEDICAL RECORD NO.:  [id number]          PATIENT TYPE:  AMB   LOCATION:  ENDO                         FACILITY:  [facility]   PHYSICIAN:  Ulyess Mort, MD  DATE OF BIRTH:  1950/06/03   DATE OF PROCEDURE:  09/09/2008  DATE OF DISCHARGE:                               OPERATIVE REPORT   This very nice lady came into see a physician at [facility] to provide  her care, and she had a history in the family of colon cancer, father  having had colon cancer, although she herself did not even know why she  was here to get colonoscopic examination.  She had occult blood stools  that were normal, Pap smear that was normal.  She was on  hydrochlorothiazide and Diflucan.  She uses Ventolin for her asthma.   PHYSICAL EXAMINATION:  She is a 60 year old female, weighs 233 pounds.  Blood pressure was 132/78, pulse 78 and regular, respirations 13 and  regular.  Her oropharynx was negative.  NECK:  Was negative.  CHEST:  Was clear.  HEART:  Revealed a regular rhythm.  ABDOMEN:  Soft.  No masses or organomegaly.  EXTREMITIES:  Unremarkable.  SKIN:  Appeared clear.   IMPRESSION:  1. History of asthma.  2. Morbid obesity.  3. Hypertension.   RECOMMENDATIONS:  Recommendation is that she have a full colonoscopy on  admission because of the family history, father having had colon cancer.  The patient discussed the procedure as noted.   ANESTHESIA:  The patient was premedicated with 2 mg of Versed, 100 mcg  of fentanyl IV.   PROCEDURE NOTE:  The Olympus colonoscope was entered intact into the  colon without difficulty.  On retraction, careful inspection, well-  prepped colon.  There did not appear to be any mucosal lesions of  significance.  The patient tolerated the procedure nicely and the  endoscope was retracted completely.   IMPRESSION:  Colon rectum to cecum essentially normal examination in a  patient with well-prepped  colon.   COMMENT:  I think this patient should have a repeat colonoscopic  examination in 5-10 years, I would advise 5 because of the family  history __________ her primary care physician.      Ulyess Mort, MD     SML/MEDQ  D:  09/09/2008  T:  09/09/2008  Job:  [id number]   cc:   Melvern Banker  Fax: [phone]   Reche Dixon, M.D.   __________ D[facility] FNP

## 2010-06-28 ENCOUNTER — Other Ambulatory Visit (HOSPITAL_COMMUNITY): Payer: Self-pay | Admitting: Family Medicine

## 2010-06-28 DIAGNOSIS — Z1231 Encounter for screening mammogram for malignant neoplasm of breast: Secondary | ICD-10-CM

## 2010-07-08 ENCOUNTER — Ambulatory Visit (HOSPITAL_COMMUNITY): Payer: Medicare Other | Attending: Family Medicine

## 2010-08-23 ENCOUNTER — Ambulatory Visit (HOSPITAL_BASED_OUTPATIENT_CLINIC_OR_DEPARTMENT_OTHER)
Admission: RE | Admit: 2010-08-23 | Discharge: 2010-08-23 | Disposition: A | Discharge status: Home / Self Care | Payer: Medicare Other | Source: Ambulatory Visit | Attending: Orthopedic Surgery | Admitting: Orthopedic Surgery

## 2010-08-23 DIAGNOSIS — X58XXXA Exposure to other specified factors, initial encounter: Secondary | ICD-10-CM | POA: Insufficient documentation

## 2010-08-23 DIAGNOSIS — S83289A Other tear of lateral meniscus, current injury, unspecified knee, initial encounter: Secondary | ICD-10-CM | POA: Insufficient documentation

## 2010-08-23 DIAGNOSIS — Z01810 Encounter for preprocedural cardiovascular examination: Secondary | ICD-10-CM | POA: Insufficient documentation

## 2010-08-23 DIAGNOSIS — Z01812 Encounter for preprocedural laboratory examination: Secondary | ICD-10-CM | POA: Insufficient documentation

## 2010-08-23 DIAGNOSIS — G5600 Carpal tunnel syndrome, unspecified upper limb: Secondary | ICD-10-CM | POA: Insufficient documentation

## 2010-08-23 DIAGNOSIS — IMO0002 Reserved for concepts with insufficient information to code with codable children: Secondary | ICD-10-CM | POA: Insufficient documentation

## 2010-08-23 LAB — POCT I-STAT 4, (NA,K, GLUC, HGB,HCT)
Glucose, Bld: 124 mg/dL — ABNORMAL HIGH (ref 70–99)
HCT: 35 % — ABNORMAL LOW (ref 36.0–46.0)

## 2010-08-25 NOTE — Op Note (Signed)
NAMEFARHA, [name]                  ACCOUNT NO.:  [id number]  MEDICAL RECORD NO.:  [id number]  LOCATION:  [facility]                         FACILITY:  [facility]  PHYSICIAN:  Marlowe Kays, M.D.  DATE OF BIRTH:  Nov 02, 1950  DATE OF PROCEDURE:  08/23/2010 DATE OF DISCHARGE:  06/05/2010                              OPERATIVE REPORT   PREOPERATIVE DIAGNOSES: 1. Torn medial meniscus, left knee. 2. Carpal tunnel syndrome, right upper extremity.  POSTOPERATIVE DIAGNOSES: 1. 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 2. Right carpal tunnel syndrome.  OPERATION: 1. Left knee arthroscopy with partial medial and lateral meniscectomy     and shaving of medial femoral condyle. 2. Right carpal tunnel release.  SURGEON:  Marlowe Kays, M.D.  ASSISTANT:  Nurse.  ANESTHESIA:  General.  PLAN/JUSTIFICATION FOR PROCEDURE:  On MRI, she actually has tears of medial meniscus bilaterally but her left knee was more symptomatic than her right and she elected to have this knee operated on first.  Also she has bilateral carpal tunnel syndrome electrically with nerve conduction studies demonstrating her left more worse than her right but symptomatically her right was worse than the left and she elected to have her right carpal tunnel release first at this time.  DESCRIPTION OF PROCEDURE:  Satisfied general anesthesia, Ace wrap and knee support to right lower extremity, pneumatic tourniquet applied to left lower extremity with leg Esmarch out nonsterilely and the tourniquet inflated to 350 mmHg, thigh stabilizer applied.  The left leg was then prepped with DuraPrep and stabilized and ankle draped in sterile field.  Time-out was performed.  I just made a superomedial portal when the tourniquet deflated spontaneously and I performed the case without the use of the tourniquet.  First an anterolateral portal on medial compartment of knee joint was  evaluated.  She was found to have extensive chondromalacia, not quite full thickness, most of the weightbearing portion of the medial femoral condyle which I shaved down until smooth with a 3.5 shaver.  Posteriorly she had extensive tearing of the posterior curve and at posterior intercondylar root.  I trimmed all this back to stable rim with a combination of baskets and the 3.5 shaver.  The small fragment of the posterior root, I trimmed back until there was no longer any fragment that could be interposed in the joint. Looking at the medial gutter and suprapatellar area, she had some osteoarthritis wear of the patella but nothing shaveable.  I then reversed portals.  In the lateral joint, she had a large synovial flap overlying the anterior portion of the  lateral meniscus.  Also had significant fraying of the inner border of the lateral meniscus.  With 3.5 shaver, I resected the large synovial flap and also shaved down the lateral meniscus until smooth.  The knee joint was then irrigated until clear and all fluid possible removed.  I closed the 2 entry portals with 4-0 nylon and then injected 20 cc of 0.5% Marcaine with adrenaline, 4 mg of morphine through the inflow apparatus which I closed with 4-0 nylon as well.  Betadine, Adaptic, dry sterile dressing  and Ace wrap were applied.  We then proceeded to work on her right upper extremity.  The scrub nurse remained sterile and helped to apply the tourniquet to right upper extremity and Esmarch the arm out nonsterilely and tourniquet was inflated to 250 mmHg.  We then prepped the right arm from elbow to fingertips with DuraPrep and draped in sterile field.  I made a curved incision along the base of thenar eminence crossing obliquely over the flexor crease of the wrist and the distal forearm.  The median nerve was identified.  There was no palmaris longus tendon.  I then released the skin, subcutaneous tissue and fascia into the distal  palm.  Potential bleeders were coagulated with bipolar cautery.  The median nerve was a little different anatomically with a number of very small branches rather than one major branch.  With careful dissection, none of these branches were disturbed.  I then irrigated the wound well with sterile saline, closed the skin and subcutaneous tissue only with interrupted 4- 0 nylon mattress sutures followed by Betadine, Adaptic, dry sterile dressing and volar plaster splint.  Tourniquet was released.  At time of this dictation, she was on her way to recovery room in satisfactory condition with no known complications.          ______________________________ Marlowe Kays, M.D.     JA/MEDQ  D:  08/23/2010  T:  08/23/2010  Job:  [id number]  Electronically Signed by Marlowe Kays M.D. on 08/25/2010 12:56:32 PM

## 2010-11-21 ENCOUNTER — Inpatient Hospital Stay (INDEPENDENT_AMBULATORY_CARE_PROVIDER_SITE_OTHER)
Admission: RE | Admit: 2010-11-21 | Discharge: 2010-11-21 | Disposition: A | Discharge status: Home / Self Care | Payer: Medicare Other | Source: Ambulatory Visit | Attending: Family Medicine | Admitting: Family Medicine

## 2010-11-21 DIAGNOSIS — R7989 Other specified abnormal findings of blood chemistry: Secondary | ICD-10-CM

## 2010-11-22 LAB — RPR: RPR Ser Ql: NONREACTIVE

## 2011-08-27 ENCOUNTER — Other Ambulatory Visit: Payer: Self-pay

## 2011-08-27 ENCOUNTER — Emergency Department (HOSPITAL_COMMUNITY)
Admission: EM | Admit: 2011-08-27 | Discharge: 2011-08-28 | Disposition: A | Discharge status: Home / Self Care | Payer: Medicare Other | Attending: Emergency Medicine | Admitting: Emergency Medicine

## 2011-08-27 DIAGNOSIS — K219 Gastro-esophageal reflux disease without esophagitis: Secondary | ICD-10-CM | POA: Diagnosis not present

## 2011-08-27 DIAGNOSIS — R079 Chest pain, unspecified: Secondary | ICD-10-CM | POA: Insufficient documentation

## 2011-08-27 DIAGNOSIS — R0789 Other chest pain: Secondary | ICD-10-CM | POA: Diagnosis not present

## 2011-08-28 LAB — CBC WITH DIFFERENTIAL/PLATELET
Basophils Absolute: 0 10*3/uL (ref 0.0–0.1)
Eosinophils Absolute: 0.3 10*3/uL (ref 0.0–0.7)
Eosinophils Relative: 5 % (ref 0–5)
Lymphs Abs: 3 10*3/uL (ref 0.7–4.0)
MCH: 28.8 pg (ref 26.0–34.0)
MCV: 87.8 fL (ref 78.0–100.0)
Neutrophils Relative %: 33 % — ABNORMAL LOW (ref 43–77)
Platelets: 203 10*3/uL (ref 150–400)
RBC: 4.17 MIL/uL (ref 3.87–5.11)
RDW: 14.1 % (ref 11.5–15.5)
WBC: 5.8 10*3/uL (ref 4.0–10.5)

## 2011-08-28 LAB — POCT I-STAT TROPONIN I: Troponin i, poc: 0 ng/mL (ref 0.00–0.08)

## 2011-08-30 MED FILL — Aspirin Chew Tab 81 MG: ORAL | Qty: 4 | Status: AC

## 2011-09-06 DIAGNOSIS — I10 Essential (primary) hypertension: Secondary | ICD-10-CM | POA: Diagnosis not present

## 2012-05-14 ENCOUNTER — Encounter (HOSPITAL_COMMUNITY): Payer: Self-pay | Admitting: Emergency Medicine

## 2012-05-14 ENCOUNTER — Emergency Department (HOSPITAL_COMMUNITY)
Admission: EM | Admit: 2012-05-14 | Discharge: 2012-05-14 | Disposition: A | Discharge status: Home / Self Care | Payer: Medicare Other | Attending: Emergency Medicine | Admitting: Emergency Medicine

## 2012-05-14 DIAGNOSIS — X500XXA Overexertion from strenuous movement or load, initial encounter: Secondary | ICD-10-CM | POA: Insufficient documentation

## 2012-05-14 DIAGNOSIS — Y9301 Activity, walking, marching and hiking: Secondary | ICD-10-CM | POA: Insufficient documentation

## 2012-05-14 DIAGNOSIS — S139XXA Sprain of joints and ligaments of unspecified parts of neck, initial encounter: Secondary | ICD-10-CM | POA: Insufficient documentation

## 2012-05-14 DIAGNOSIS — I10 Essential (primary) hypertension: Secondary | ICD-10-CM | POA: Diagnosis not present

## 2012-05-14 DIAGNOSIS — Y929 Unspecified place or not applicable: Secondary | ICD-10-CM | POA: Insufficient documentation

## 2012-05-14 DIAGNOSIS — S161XXA Strain of muscle, fascia and tendon at neck level, initial encounter: Secondary | ICD-10-CM

## 2012-05-14 HISTORY — DX: Essential (primary) hypertension: I10

## 2012-05-14 MED ORDER — IBUPROFEN 800 MG PO TABS: 800.0000 mg | ORAL_TABLET | Freq: Three times a day (TID) | ORAL | Status: DC | PRN

## 2012-05-14 MED ORDER — HYDROCODONE-ACETAMINOPHEN 5-325 MG PO TABS: 1.0000 | ORAL_TABLET | Freq: Four times a day (QID) | ORAL | Status: DC | PRN

## 2012-05-14 NOTE — ED Provider Notes (Signed)
History     CSN: [id number]  Arrival date & time 05/14/12  1214   First MD Initiated Contact with Patient 05/14/12 1355      Chief Complaint  Patient presents with  . Neck Pain    (Consider location/radiation/quality/duration/timing/severity/associated sxs/prior treatment) HPI The patient presents to the ER with R neck spasms and pain. The patient states that she awoke with R sided neck pain with spasms. The patient denies chest pain, SOB, weakness, nausea, vomiting, sweating, headache, visual changes, numbness, back pain, fever, cough, or abdominal pain. The patient states that she feels like there is a "crick" in her neck. The patient did not take anything prior to arrival. Movement and palpation make the pain worse. Past Medical History  Diagnosis Date  . Hypertension     History reviewed. No pertinent past surgical history.  History reviewed. No pertinent family history.  History  Substance Use Topics  . Smoking status: Never Smoker   . Smokeless tobacco: Not on file  . Alcohol Use: No    OB History   Grav Para Term Preterm Abortions TAB SAB Ect Mult Living                  Review of Systems All other systems negative except as documented in the HPI. All pertinent positives and negatives as reviewed in the HPI.  Allergies  Review of patient's allergies indicates no known allergies.  Home Medications   Current Outpatient Rx  Name  Route  Sig  Dispense  Refill  . losartan-hydrochlorothiazide (HYZAAR) 50-12.5 MG per tablet   Oral   Take 1 tablet by mouth daily.         . naproxen (NAPROSYN) 500 MG tablet   Oral   Take 500 mg by mouth 2 (two) times daily with a meal.           BP 158/82  Pulse 95  Temp(Src) 97.9 F (36.6 C)  Resp 16  SpO2 97%  Physical Exam  Nursing note and vitals reviewed. Constitutional: She is oriented to person, place, and time. She appears well-developed and well-nourished. No distress.  HENT:  Head: Normocephalic and  atraumatic.  Mouth/Throat: Oropharynx is clear and moist. No oropharyngeal exudate.  Eyes: Pupils are equal, round, and reactive to light.  Neck: Trachea normal. Neck supple. Normal carotid pulses, no hepatojugular reflux and no JVD present. Muscular tenderness present. Carotid bruit is not present. No rigidity. No erythema present. No mass and no thyromegaly present.    Cardiovascular: Normal rate, regular rhythm and normal heart sounds.   Pulmonary/Chest: Effort normal and breath sounds normal. No stridor.  Neurological: She is alert and oriented to person, place, and time. She displays normal reflexes. She exhibits normal muscle tone. Coordination normal.  Skin: Skin is warm and dry. No erythema.    ED Course  Procedures (including critical care time) The patient has pain over the SCM. The patient most likely has strained her SCM while sleeping. Told to use ice and heat on her neck. Told to follow up with her PCP. Return here as needed.     MDM          Carlyle Dolly, PA-C 05/17/12 1528

## 2012-05-14 NOTE — ED Notes (Signed)
Pt c/o right sided neck pain starting today when waking; pt sts worse with movement

## 2012-05-17 NOTE — ED Provider Notes (Signed)
Medical screening examination/treatment/procedure(s) were performed by non-physician practitioner and as supervising physician I was immediately available for consultation/collaboration.  Navie Lamoreaux R. Sergi Gellner, MD 05/17/12 2137 

## 2012-05-23 ENCOUNTER — Encounter (HOSPITAL_COMMUNITY): Payer: Self-pay

## 2012-05-23 ENCOUNTER — Emergency Department (INDEPENDENT_AMBULATORY_CARE_PROVIDER_SITE_OTHER)
Admission: EM | Admit: 2012-05-23 | Discharge: 2012-05-23 | Disposition: A | Discharge status: Home / Self Care | Payer: Medicare Other | Source: Home / Self Care

## 2012-05-23 DIAGNOSIS — R079 Chest pain, unspecified: Secondary | ICD-10-CM | POA: Diagnosis not present

## 2012-05-23 DIAGNOSIS — I10 Essential (primary) hypertension: Secondary | ICD-10-CM

## 2012-05-23 DIAGNOSIS — R0781 Pleurodynia: Secondary | ICD-10-CM

## 2012-05-23 NOTE — ED Notes (Addendum)
States she checked her BP regularly at home, and has been having a consistently high reading on her home machine. Today, has pain mid sternal area, not reproducible, non- radiating, skin w/d/color   Was seen in ED on 4-17 for evaluation of HA and neck pain, has not used any of her Rx, and has it in her purse.  States her pain is midsternum, and no where else, denies any other syx

## 2012-05-23 NOTE — ED Provider Notes (Signed)
History     CSN: [id number]  Arrival date & time 05/23/12  1228   None     Chief Complaint  Patient presents with  . Hypertension    (Consider location/radiation/quality/duration/timing/severity/associated sxs/prior treatment) HPI Comments: 62 year old female with a history of hypertension presents with a concern that her blood pressures are elevated at home. She did not bring a list of those values with her. On arrival she had a normal blood pressure. Her only other complaint is that of headache feeling tight. She denies problems with vision, speech, hearing or swallowing. Denies chest pain or discomfort of any character, shortness of breath or abdominal pain. Does complain of lateral chest wall pain associated with movement such as rotation of the torso. This is located along the left midaxillary line.   Past Medical History  Diagnosis Date  . Hypertension     History reviewed. No pertinent past surgical history.  History reviewed. No pertinent family history.  History  Substance Use Topics  . Smoking status: Never Smoker   . Smokeless tobacco: Not on file  . Alcohol Use: No    OB History   Grav Para Term Preterm Abortions TAB SAB Ect Mult Living                  Review of Systems  Constitutional: Negative for fever, activity change and fatigue.  HENT: Negative.   Respiratory: Negative for cough, shortness of breath and wheezing.   Cardiovascular: Negative for chest pain, palpitations and leg swelling.  Gastrointestinal: Negative.   Genitourinary: Negative.   Musculoskeletal: Negative.   Skin: Negative for color change, pallor and rash.  Neurological: Negative.     Allergies  Review of patient's allergies indicates no known allergies.  Home Medications   Current Outpatient Rx  Name  Route  Sig  Dispense  Refill  . losartan-hydrochlorothiazide (HYZAAR) 50-12.5 MG per tablet   Oral   Take 1 tablet by mouth daily.         Marland Kitchen HYDROcodone-acetaminophen  (NORCO/VICODIN) 5-325 MG per tablet   Oral   Take 1 tablet by mouth every 6 (six) hours as needed for pain.   15 tablet   0   . ibuprofen (ADVIL,MOTRIN) 800 MG tablet   Oral   Take 1 tablet (800 mg total) by mouth every 8 (eight) hours as needed for pain.   21 tablet   0   . naproxen (NAPROSYN) 500 MG tablet   Oral   Take 500 mg by mouth 2 (two) times daily with a meal.           BP 137/84  Pulse 80  Temp(Src) 97.7 F (36.5 C) (Oral)  Resp 16  SpO2 100%  Physical Exam  Nursing note and vitals reviewed. Constitutional: She is oriented to person, place, and time. She appears well-developed and well-nourished. No distress.  Morbidly obese  HENT:  Head: Normocephalic and atraumatic.  Mouth/Throat: Oropharynx is clear and moist. No oropharyngeal exudate.  Eyes: EOM are normal. Pupils are equal, round, and reactive to light.  Neck: Normal range of motion. Neck supple.  Cardiovascular: Normal rate and normal heart sounds.   Pulmonary/Chest: Effort normal and breath sounds normal. No respiratory distress. She has no wheezes. She has no rales.  Abdominal: Soft. There is no tenderness. There is no rebound and no guarding.  Musculoskeletal: Normal range of motion. She exhibits no edema and no tenderness.  Lymphadenopathy:    She has no cervical adenopathy.  Neurological: She is  alert and oriented to person, place, and time. No cranial nerve deficit.  Skin: Skin is warm and dry.  Psychiatric: She has a normal mood and affect.    ED Course  Procedures (including critical care time)  Labs Reviewed - No data to display No results found.   1. HTN (hypertension)   2. Rib pain on left side       MDM  Reassurance. EKG: Normal sinus rhythm, no acute ischemic changes. Continue to take your blood pressure medicine as prescribed. Blood pressure is normal today. Record her blood pressures at home and take them to the [facility] clinic her next visit. The pain appointment soon as  possible. Cream use if problems or worsening such as chest pain, shortness of breath, swelling or other symptoms go to the emergency department.        Hayden Rasmussen, NP 05/23/12 1459

## 2012-05-23 NOTE — ED Provider Notes (Signed)
Medical screening examination/treatment/procedure(s) were performed by non-physician practitioner and as supervising physician I was immediately available for consultation/collaboration.   [facility]; MD  Sharin Grave, MD 05/23/12 2050

## 2012-05-23 NOTE — ED Notes (Signed)
K Schorr advised of pt c/o

## 2012-08-17 ENCOUNTER — Emergency Department (INDEPENDENT_AMBULATORY_CARE_PROVIDER_SITE_OTHER)
Admission: EM | Admit: 2012-08-17 | Discharge: 2012-08-17 | Disposition: A | Discharge status: Nursing Home (Includes ICF) | Payer: Medicare Other | Source: Home / Self Care | Attending: Emergency Medicine | Admitting: Emergency Medicine

## 2012-08-17 ENCOUNTER — Emergency Department (HOSPITAL_COMMUNITY): Payer: Medicare Other

## 2012-08-17 ENCOUNTER — Encounter (HOSPITAL_COMMUNITY): Payer: Self-pay | Admitting: Emergency Medicine

## 2012-08-17 ENCOUNTER — Observation Stay (HOSPITAL_COMMUNITY)
Admission: EM | Admit: 2012-08-17 | Discharge: 2012-08-18 | Disposition: A | Discharge status: Home / Self Care | Payer: Medicare Other | Attending: Family Medicine | Admitting: Family Medicine

## 2012-08-17 DIAGNOSIS — R42 Dizziness and giddiness: Secondary | ICD-10-CM | POA: Diagnosis not present

## 2012-08-17 DIAGNOSIS — Z79899 Other long term (current) drug therapy: Secondary | ICD-10-CM | POA: Diagnosis not present

## 2012-08-17 DIAGNOSIS — M1710 Unilateral primary osteoarthritis, unspecified knee: Secondary | ICD-10-CM | POA: Diagnosis not present

## 2012-08-17 DIAGNOSIS — E876 Hypokalemia: Secondary | ICD-10-CM | POA: Diagnosis present

## 2012-08-17 DIAGNOSIS — R55 Syncope and collapse: Principal | ICD-10-CM | POA: Diagnosis present

## 2012-08-17 DIAGNOSIS — I10 Essential (primary) hypertension: Secondary | ICD-10-CM | POA: Diagnosis not present

## 2012-08-17 DIAGNOSIS — R9431 Abnormal electrocardiogram [ECG] [EKG]: Secondary | ICD-10-CM

## 2012-08-17 DIAGNOSIS — R0602 Shortness of breath: Secondary | ICD-10-CM | POA: Diagnosis not present

## 2012-08-17 DIAGNOSIS — E860 Dehydration: Secondary | ICD-10-CM | POA: Insufficient documentation

## 2012-08-17 DIAGNOSIS — M129 Arthropathy, unspecified: Secondary | ICD-10-CM | POA: Diagnosis present

## 2012-08-17 HISTORY — DX: Unspecified osteoarthritis, unspecified site: M19.90

## 2012-08-17 LAB — COMPREHENSIVE METABOLIC PANEL
ALT: 60 U/L — ABNORMAL HIGH (ref 0–35)
Alkaline Phosphatase: 70 U/L (ref 39–117)
Calcium: 9.7 mg/dL (ref 8.4–10.5)
Chloride: 102 mEq/L (ref 96–112)
GFR calc Af Amer: 70 mL/min — ABNORMAL LOW (ref >90)
GFR calc non Af Amer: 61 mL/min — ABNORMAL LOW (ref >90)
Potassium: 3.1 mEq/L — ABNORMAL LOW (ref 3.5–5.1)
Total Protein: 7.5 g/dL (ref 6.0–8.3)

## 2012-08-17 LAB — POCT I-STAT, CHEM 8
Hemoglobin: 14.6 g/dL (ref 12.0–15.0)
Sodium: 140 mEq/L (ref 135–145)
TCO2: 21 mmol/L (ref 0–100)

## 2012-08-17 LAB — URINALYSIS, ROUTINE W REFLEX MICROSCOPIC
Bilirubin Urine: NEGATIVE
Nitrite: NEGATIVE
Protein, ur: NEGATIVE mg/dL
Specific Gravity, Urine: 1.014 (ref 1.005–1.030)
Urobilinogen, UA: 1 mg/dL (ref 0.0–1.0)

## 2012-08-17 LAB — BASIC METABOLIC PANEL
CO2: 23 mEq/L (ref 19–32)
Calcium: 9.6 mg/dL (ref 8.4–10.5)
Chloride: 103 mEq/L (ref 96–112)
GFR calc Af Amer: 75 mL/min — ABNORMAL LOW (ref >90)
Sodium: 134 mEq/L — ABNORMAL LOW (ref 135–145)

## 2012-08-17 LAB — CBC
MCH: 28.3 pg (ref 26.0–34.0)
Platelets: 251 10*3/uL (ref 150–400)
RBC: 4.66 MIL/uL (ref 3.87–5.11)
WBC: 6 10*3/uL (ref 4.0–10.5)

## 2012-08-17 LAB — URINE MICROSCOPIC-ADD ON

## 2012-08-17 LAB — POCT I-STAT TROPONIN I: Troponin i, poc: 0 ng/mL (ref 0.00–0.08)

## 2012-08-17 LAB — LIPASE, BLOOD: Lipase: 33 U/L (ref 11–59)

## 2012-08-17 MED ORDER — ONDANSETRON HCL 4 MG/2ML IJ SOLN: 4.0000 mg | Freq: Four times a day (QID) | INTRAMUSCULAR | Status: DC | PRN

## 2012-08-17 MED ORDER — SODIUM CHLORIDE 0.9 % IV BOLUS (SEPSIS)
1000.0000 mL | Freq: Once | INTRAVENOUS | Status: AC
  Administered 2012-08-17: 1000 mL via INTRAVENOUS

## 2012-08-17 MED ORDER — ACETAMINOPHEN 325 MG PO TABS
650.0000 mg | ORAL_TABLET | Freq: Four times a day (QID) | ORAL | Status: DC | PRN
Start: 2012-08-17 — End: 2012-08-18

## 2012-08-17 MED ORDER — SODIUM CHLORIDE 0.9 % IV SOLN
INTRAVENOUS | Status: DC
  Administered 2012-08-17 – 2012-08-18 (×2): via INTRAVENOUS

## 2012-08-17 MED ORDER — ACETAMINOPHEN 650 MG RE SUPP: 650.0000 mg | Freq: Four times a day (QID) | RECTAL | Status: DC | PRN

## 2012-08-17 MED ORDER — SODIUM CHLORIDE 0.9 % IV SOLN: INTRAVENOUS | Status: DC

## 2012-08-17 MED ORDER — POTASSIUM CHLORIDE CRYS ER 20 MEQ PO TBCR
60.0000 meq | EXTENDED_RELEASE_TABLET | Freq: Once | ORAL | Status: AC
  Administered 2012-08-17: 60 meq via ORAL
  Filled 2012-08-17: qty 3

## 2012-08-17 MED ORDER — SODIUM CHLORIDE 0.9 % IJ SOLN
3.0000 mL | Freq: Two times a day (BID) | INTRAMUSCULAR | Status: DC
  Administered 2012-08-18: 3 mL via INTRAVENOUS

## 2012-08-17 MED ORDER — ONDANSETRON HCL 4 MG PO TABS: 4.0000 mg | ORAL_TABLET | Freq: Four times a day (QID) | ORAL | Status: DC | PRN

## 2012-08-17 MED ORDER — IBUPROFEN 800 MG PO TABS
800.0000 mg | ORAL_TABLET | Freq: Three times a day (TID) | ORAL | Status: DC | PRN
  Filled 2012-08-17: qty 1

## 2012-08-17 MED ORDER — LOSARTAN POTASSIUM 50 MG PO TABS
50.0000 mg | ORAL_TABLET | Freq: Every day | ORAL | Status: DC
  Administered 2012-08-18: 50 mg via ORAL
  Filled 2012-08-17: qty 1

## 2012-08-17 MED ORDER — ALUM & MAG HYDROXIDE-SIMETH 200-200-20 MG/5ML PO SUSP: 30.0000 mL | Freq: Four times a day (QID) | ORAL | Status: DC | PRN

## 2012-08-17 MED ORDER — LOSARTAN POTASSIUM-HCTZ 50-12.5 MG PO TABS: 1.0000 | ORAL_TABLET | Freq: Every day | ORAL | Status: DC

## 2012-08-17 MED ORDER — HEPARIN SODIUM (PORCINE) 5000 UNIT/ML IJ SOLN
5000.0000 [IU] | Freq: Three times a day (TID) | INTRAMUSCULAR | Status: DC
  Administered 2012-08-17 – 2012-08-18 (×3): 5000 [IU] via SUBCUTANEOUS
  Filled 2012-08-17 (×5): qty 1

## 2012-08-17 MED ORDER — HYDROCHLOROTHIAZIDE 12.5 MG PO CAPS
12.5000 mg | ORAL_CAPSULE | Freq: Every day | ORAL | Status: DC
  Administered 2012-08-18: 12.5 mg via ORAL
  Filled 2012-08-17: qty 1

## 2012-08-17 NOTE — ED Notes (Signed)
Pt c/o feeling tired onset Wednesday... sxs also include: cramps on bilateral sides and radiates to abd; pain is intermittent; also having HA, light headed, diarrhea, decreased appetite... Denies: f/v/n, CP, SOB... Taking ibup w/temp relief.

## 2012-08-17 NOTE — ED Notes (Addendum)
Pt presents to ED from [facility] with complaints of fatigue, dizziness, shortness of breath and bilateral flank pain.

## 2012-08-17 NOTE — ED Provider Notes (Signed)
History    CSN: [id number] Arrival date & time 08/17/12  1121  First MD Initiated Contact with Patient 08/17/12 1151     Chief Complaint  Patient presents with  . Fatigue   (Consider location/radiation/quality/duration/timing/severity/associated sxs/prior Treatment) HPI Comments: Patient presents to urgent care describing that since Wednesday she's been having episodes of dizziness, associated with headaches, as well as intermittent upper abdominal pains. ( Patient points to both epigastric regions and right and left upper quadrant), " pain comes and go", patient has felt determined and nausea feel lightheaded almost like " I'm going to pass out"..." him not feeling well".  Patient denies any shortness of breath or chest pain  The history is provided by the patient.   Past Medical History  Diagnosis Date  . Hypertension    History reviewed. No pertinent past surgical history. No family history on file. History  Substance Use Topics  . Smoking status: Never Smoker   . Smokeless tobacco: Not on file  . Alcohol Use: No   OB History   Grav Para Term Preterm Abortions TAB SAB Ect Mult Living                 Review of Systems  Constitutional: Positive for activity change. Negative for fever, chills, diaphoresis and fatigue.  Respiratory: Negative for shortness of breath.   Cardiovascular: Negative for chest pain, palpitations and leg swelling.  Gastrointestinal: Positive for abdominal pain. Negative for abdominal distention.  Musculoskeletal: Negative for back pain.  Neurological: Positive for dizziness, weakness and headaches.    Allergies  Review of patient's allergies indicates no known allergies.  Home Medications   Current Outpatient Rx  Name  Route  Sig  Dispense  Refill  . losartan-hydrochlorothiazide (HYZAAR) 50-12.5 MG per tablet   Oral   Take 1 tablet by mouth daily.         Marland Kitchen HYDROcodone-acetaminophen (NORCO/VICODIN) 5-325 MG per tablet   Oral   Take  1 tablet by mouth every 6 (six) hours as needed for pain.   15 tablet   0   . ibuprofen (ADVIL,MOTRIN) 800 MG tablet   Oral   Take 1 tablet (800 mg total) by mouth every 8 (eight) hours as needed for pain.   21 tablet   0   . naproxen (NAPROSYN) 500 MG tablet   Oral   Take 500 mg by mouth 2 (two) times daily with a meal.          BP 113/72  Pulse 103  Temp(Src) 97.9 F (36.6 C) (Oral)  Resp 16  SpO2 99% Physical Exam  Nursing note and vitals reviewed. Constitutional: Vital signs are normal. She appears well-developed.  Non-toxic appearance. She does not have a sickly appearance. She does not appear ill. No distress.  HENT:  Head: Normocephalic.  Eyes: Conjunctivae are normal.  Neck: Neck supple. No JVD present.  Cardiovascular: Normal rate and regular rhythm.  Exam reveals no gallop and no friction rub.   No murmur heard. Pulmonary/Chest: Effort normal and breath sounds normal. She has no decreased breath sounds.  Abdominal: Soft. There is no tenderness.  Neurological: She is alert.  Skin: No erythema.    ED Course  Procedures (including critical care time) Labs Reviewed  POCT I-STAT, CHEM 8 - Abnormal; Notable for the following:    Potassium 3.1 (*)    BUN 27 (*)    Calcium, Ion 1.32 (*)    All other components within normal limits   No results  found. No diagnosis found. EKG showed normal sinus rhythm with- continues T wave inversions in all inferior and lateral leads. Perhaps a U wave in aVL. Concerning EKG. MDM  Hypokalemia- dizziness and nonspecific upper abdominal discomforts.   Patient presents urgent care multisymptom- scenario ( denies any chest pain) Includes headaches, dizziness " presyncopal", intermittent upper abdominal pains, At this point I cannot fully rule out a symptom related to a coronary syndrome versus electrocardiographic abnormalities related to hypokalemia patient will be transferred to the emergency department for further evaluation  and monitoring.  Jimmie Molly, MD 08/17/12 1328

## 2012-08-17 NOTE — ED Provider Notes (Signed)
History    CSN: [id number] Arrival date & time 08/17/12  1341  First MD Initiated Contact with Patient 08/17/12 1558     Chief Complaint  Patient presents with  . Fatigue   (Consider location/radiation/quality/duration/timing/severity/associated sxs/prior Treatment) HPI Comments: [name] is a 62 y.o. female here with presyncope x 4 days.  Patient states she becomes lightheaded with activity and it is relieved with rest.  She denies any headaches, blurry vision, chest pain, or SOB with these events.  She has had diarrhea >5x per day for the past 3 days.  She admits to not eating well secondary to not having a strong appetite.  This is new for her.  She currently denies abd pain, N/V, dysuria or incontinence.  ROS is otherwise negative.  The history is provided by the patient.   Past Medical History  Diagnosis Date  . Hypertension   . Arthritis    History reviewed. No pertinent past surgical history. History reviewed. No pertinent family history. History  Substance Use Topics  . Smoking status: Never Smoker   . Smokeless tobacco: Not on file  . Alcohol Use: No   OB History   Grav Para Term Preterm Abortions TAB SAB Ect Mult Living                 Review of Systems 10 Systems reviewed and are negative for acute change except as noted in the HPI.  Allergies  Review of patient's allergies indicates no known allergies.  Home Medications   Current Outpatient Rx  Name  Route  Sig  Dispense  Refill  . ibuprofen (ADVIL,MOTRIN) 800 MG tablet   Oral   Take 800 mg by mouth every 8 (eight) hours as needed for pain.         Marland Kitchen losartan-hydrochlorothiazide (HYZAAR) 50-12.5 MG per tablet   Oral   Take 1 tablet by mouth daily.          BP 109/64  Pulse 101  Temp(Src) 98.3 F (36.8 C) (Oral)  Resp 14  Ht 5\' 3"  (1.6 m)  Wt 232 lb (105.235 kg)  BMI 41.11 kg/m2  SpO2 97% Physical Exam  ED Course  Procedures (including critical care time) Labs Reviewed  BASIC  METABOLIC PANEL - Abnormal; Notable for the following:    Sodium 134 (*)    Potassium 3.0 (*)    Glucose, Bld 101 (*)    BUN 25 (*)    GFR calc non Af Amer 65 (*)    GFR calc Af Amer 75 (*)    All other components within normal limits  COMPREHENSIVE METABOLIC PANEL - Abnormal; Notable for the following:    Sodium 134 (*)    Potassium 3.1 (*)    Glucose, Bld 107 (*)    BUN 26 (*)    ALT 60 (*)    GFR calc non Af Amer 61 (*)    GFR calc Af Amer 70 (*)    All other components within normal limits  URINALYSIS, ROUTINE W REFLEX MICROSCOPIC - Abnormal; Notable for the following:    Hgb urine dipstick TRACE (*)    All other components within normal limits  CBC  LIPASE, BLOOD  URINE MICROSCOPIC-ADD ON  TSH  T3, FREE  POCT I-STAT TROPONIN I   Dg Chest 2 View  08/17/2012   *RADIOLOGY REPORT*  Clinical Data: Dizziness and shortness of breath.  CHEST - 2 VIEW  Comparison: 06/05/2010  Findings: No pulmonary infiltrates, edema, nodules or  pleural fluid are identified.  Heart size is within normal limits.  The thoracic aorta shows stable mild tortuosity.  Stable diffuse degenerative changes are identified throughout the thoracic spine.  IMPRESSION: No active disease.   Original Report Authenticated By: Irish Lack, M.D.   No diagnosis found.  Date: 08/17/2012  Rate: 101  Rhythm: sinus tachycardia  QRS Axis: normal  Intervals: normal  ST/T Wave abnormalities: ST depressions inferiorly  Conduction Disutrbances:none  Narrative Interpretation:   Old EKG Reviewed: none available    MDM  Patient is presenting with presyncope for the past 4 days.  She does live alone and does not have a PCP.  She will require admission as she is at risk for syncopal episode at home alone.  Patient is amendable with this plan. She was given 1L IVF, potassium replacement in the ED.     Tomasita Crumble, MD 08/23/12 1415

## 2012-08-17 NOTE — H&P (Signed)
Family Medicine Teaching [facility] Admission History and Physical Service Pager: [phone]  Patient name: [name] Medical record number: [id number] Date of birth: Jan 08, 1951 Age: 62 y.o. Gender: female  Primary Care Provider: No PCP Per Patient (previously followed at Urgent Care) Consultants: None Code Status: Full Code  Chief Complaint: lightheadedness  Assessment and Plan: [name] is a 62 y.o. year old female presenting with pre-syncope x4 days . PMH is significant for HTN.  # Pre-syncope - Based on recent GI illness, symptoms most likely secondary to dehydration. Orthostasis, CHF, thyroid dysfunction or new DM may also explain symptoms. - Obs status on telemetry - EKG in AM (See below) - Orthostatic vital signs on admission - TSH, A1C, lipid panel for risk stratification - s/p 1L bolus in ED, continue fluids at 100cc/hr - s/p Kdur , recheck Bmet in AM and replete prn - will also check 2D echo with the long standing hypertension and complain of mild leg edema - allow patient to eat as tolerated (family members state they would like to bring her food)  # EKG changes- patient has flipped Twaves in leads I, II, II, AVF, V4-6 which is slightly changed from old EKG. Also has some evidence of LVH. Patient reports no chest pain. POC troponin negative - Monitor on tele - EKG for chest pain - Troponin x2 - EKG in morning to re-evaluate   # HTN- Stable - Continue home ARB and HCTZ - Monitor vitals per floor protocol  # Hypokalemia- - replaced in ED. Recheck Bmet in AM  # Arthritis- - Continue home ibuprofen - Tylenol as needed as well for pain  FEN/GI: Heart healthy diet. NS @ 100cc/hr Prophylaxis: Heparin SQ  Disposition: Pending further work up and clinical improvement.  History of Present Illness: [name] is a 62 y.o. year old female presenting with pre-syncopal symptoms for the last 4 days. She states this morning she was eating a bowl of cereal  and she had lightheadedness associated with a "heaviness" in her hands. That was when she went to urgent care, who sent her to the ED for hypokalemia and EKG changes. Patient states she has never had anything this before. She reports she never really passed out, but has constantly felt lightheaded. She said it was worse with exertion (like moving a mattress alone.) Not worse with position changes. Patient said she had diarrhea and abdominal cramping for a few days, which has mostly resolved. She has decreased appetite but reports drinking plenty of water. Has not been outside in the heat more than usual. No foreign travel, no new foods, no sick contacts. Denies any previous heart problems. No chest pain. 9lb weight loss with the recent GI illness. She endorses swelling in her legs but no orthopnea.   Review Of Systems: Per HPI, otherwise 12 point review of systems was performed and was unremarkable.  Patient Active Problem List   Diagnosis Date Noted  . ARTHRITIS 03/28/2010  . DE QUERVAIN'S TENOSYNOVITIS 03/28/2010  . ANEMIA 07/26/2009  . REACTIVE AIRWAY DISEASE 03/09/2009  . DENTAL CARIES 03/09/2009  . CANDIDIASIS OF VULVA AND VAGINA 06/04/2008  . SKIN TAG 06/04/2008  . ARTHRITIS, KNEES, BILATERAL 05/22/2008  . HYPERTENSION, BENIGN ESSENTIAL 05/04/2008  . KNEE PAIN, BILATERAL 05/04/2008  . COUGH 05/04/2008  . MENISCUS TEAR, RIGHT 12/07/2005   Past Medical History: Past Medical History  Diagnosis Date  . Hypertension   . Arthritis    Past Surgical History: Left knee arthroscopy  Social History:  History  Substance Use Topics  . Smoking status: Never Smoker   . Smokeless tobacco: Not on file  . Alcohol Use: No   Additional social history: Lives alone. Works part-time with [employer] in [place] Please also refer to relevant sections of EMR.  Family History: History reviewed. No pertinent family history. Allergies and Medications: No Known Allergies No current  facility-administered medications on file prior to encounter.   Current Outpatient Prescriptions on File Prior to Encounter  Medication Sig Dispense Refill  . losartan-hydrochlorothiazide (HYZAAR) 50-12.5 MG per tablet Take 1 tablet by mouth daily.       Objective: BP 118/73  Pulse 90  Temp(Src) 98.3 F (36.8 C) (Oral)  Resp 18  Ht 5\' 3"  (1.6 m)  Wt 232 lb (105.235 kg)  BMI 41.11 kg/m2  SpO2 100% Exam: General: Awake, alert. NAD.  HEENT: Dry mucus membranes. Upper dentures in place. Pupils equal and reactive Cardiovascular: Mild tachycardia, no murmur appreciated. No carotid bruit. Respiratory: CTAB. No crackles or wheezes Abdomen: Obese, soft, nontender Extremities: Trace edema lower extremities. Moves all extremities Skin: Large skin tag on left mid back. Otherwise unremarkable Neuro: No focal deficits. Labs and Imaging: CBC BMET   Recent Labs Lab 08/17/12 1420  WBC 6.0  HGB 13.2  HCT 39.8  PLT 251    Recent Labs Lab 08/17/12 1618  NA 134*  K 3.1*  CL 102  CO2 21  BUN 26*  CREATININE 0.98  GLUCOSE 107*  CALCIUM 9.7    Trop: 0.0 EKG: Sinus tachy. Normal axis. Normal intervals. Inverted T waves I, II, III, AVF, V4-6. ?LVH. No ST elevations.   Dg Chest 2 View 08/17/2012   *RADIOLOGY REPORT*  Clinical Data: Dizziness and shortness of breath.  CHEST - 2 VIEW  Comparison: 06/05/2010  Findings: No pulmonary infiltrates, edema, nodules or pleural fluid are identified.  Heart size is within normal limits.  The thoracic aorta shows stable mild tortuosity.  Stable diffuse degenerative changes are identified throughout the thoracic spine.  IMPRESSION: No active disease.   Original Report Authenticated By: Irish Lack, M.D.    Hilarie Fredrickson, MD 08/17/2012, 6:26 PM PGY-3, [facility] FPTS Intern pager: [phone], text pages welcome

## 2012-08-17 NOTE — Progress Notes (Signed)
  Pt admitted to the unit. Pt is stable, alert and oriented per baseline. Oriented to room, staff, and call bell. Educated to call for any assistance. Bed in lowest position, call bell within reach- will continue to monitor. 

## 2012-08-17 NOTE — ED Notes (Signed)
Resident at the bedside

## 2012-08-18 ENCOUNTER — Encounter (HOSPITAL_COMMUNITY): Payer: Self-pay | Admitting: General Practice

## 2012-08-18 DIAGNOSIS — R51 Headache: Secondary | ICD-10-CM | POA: Diagnosis not present

## 2012-08-18 DIAGNOSIS — I10 Essential (primary) hypertension: Secondary | ICD-10-CM | POA: Diagnosis not present

## 2012-08-18 DIAGNOSIS — R42 Dizziness and giddiness: Secondary | ICD-10-CM

## 2012-08-18 DIAGNOSIS — R55 Syncope and collapse: Secondary | ICD-10-CM | POA: Diagnosis not present

## 2012-08-18 DIAGNOSIS — E876 Hypokalemia: Secondary | ICD-10-CM | POA: Diagnosis not present

## 2012-08-18 DIAGNOSIS — M1710 Unilateral primary osteoarthritis, unspecified knee: Secondary | ICD-10-CM | POA: Diagnosis not present

## 2012-08-18 LAB — LIPID PANEL
Cholesterol: 159 mg/dL (ref 0–200)
Total CHOL/HDL Ratio: 5.5 RATIO
Triglycerides: 109 mg/dL (ref <150)
VLDL: 22 mg/dL (ref 0–40)

## 2012-08-18 LAB — CBC
Hemoglobin: 11.3 g/dL — ABNORMAL LOW (ref 12.0–15.0)
RBC: 3.9 MIL/uL (ref 3.87–5.11)

## 2012-08-18 LAB — BASIC METABOLIC PANEL
GFR calc Af Amer: 77 mL/min — ABNORMAL LOW (ref >90)
GFR calc non Af Amer: 66 mL/min — ABNORMAL LOW (ref >90)
Glucose, Bld: 119 mg/dL — ABNORMAL HIGH (ref 70–99)
Potassium: 3.1 mEq/L — ABNORMAL LOW (ref 3.5–5.1)
Sodium: 137 mEq/L (ref 135–145)

## 2012-08-18 LAB — HEMOGLOBIN A1C: Hgb A1c MFr Bld: 5.8 % — ABNORMAL HIGH (ref <5.7)

## 2012-08-18 LAB — TROPONIN I: Troponin I: <0.3 ng/mL (ref <0.30)

## 2012-08-18 MED ORDER — POTASSIUM CHLORIDE CRYS ER 20 MEQ PO TBCR
60.0000 meq | EXTENDED_RELEASE_TABLET | Freq: Once | ORAL | Status: AC
  Administered 2012-08-18: 60 meq via ORAL
  Filled 2012-08-18: qty 3

## 2012-08-18 NOTE — H&P (Signed)
FMTS Attending Admission Note: Carla Eniola,MD I  have seen and examined this patient, reviewed their chart. I have discussed this patient with the resident. I agree with the resident's findings, assessment and care plan.  62 y/o F with PMH anemia,HTN,with hx of lightheadedness for the last four days,worse whenever she stands or walk,she denies fall,no LOC,no seizures. No associated chest pain or tightness,no SOB or diaphoresis. Symptom worsened yesterday,hence she presented to the ED. Patient feels much better now,no other complaints.  O/E; Patient comfortable in bed,not in distress. Neuro: Awake and alert,oriented x 3. DTR intact,muscle tone normal across all joints,no limb weakness. Resp: Air entry equal B/L. No wheezing. CV: S1 S2 normal,no murmurs. Abd: benign. Ext: No weakness,no edema.  A/P; 1. Pre-syncope: Currently stable with no new event since admission.     EKG reviewed with T wave inversion which was also appreciated on EKG done April 2014. Continue telemetry monitoring. Cycle TNI.     Hemoglobin at 11 unlikely to cause symptoms.     I agree with hydration.     Pre-syncope work up; ECHO.    Fall precaution.  2. Electrolyte imbalance: Replete K+. 3.HTN: Monitor vitals,continue home med. Adjust as needed.

## 2012-08-18 NOTE — Progress Notes (Signed)
08/18/12 Patient being discharged home today, IV site removed,discharge instructions reviewed with patient.

## 2012-08-18 NOTE — Progress Notes (Signed)
Family Medicine Teaching Service Daily Progress Note Intern Pager: [phone]  Patient name: [name] Medical record number: [id number] Date of birth: 1951/01/01 Age: 62 y.o. Gender: female  Primary Care Provider: No PCP Per Patient - Previously urgent care Consultants: None Code Status: Full  Pt Overview and Major Events to Date: Admitted 7/12 for pre-syncope  Assessment and Plan:  # Pre-syncope - Based on recent GI illness, symptoms most likely secondary to dehydration. Improved today. Appetite has returned and no further symptoms of dizziness. - Orthostatic vital signs negative - TSH and A1C within normal limits - On IVF and taking better PO - LDL elevated, will recommend statin [ ]  2D echo pending  # EKG changes- No chest pain, troponin negative.  [ ]  EKG pending   # HTN- Stable  - Continue home ARB and HCTZ   # Hypokalemia- replaced in ED. K still 3.1, likely secondary to GI losses - Kdur PO 60 mEq now  # Arthritis-  - Continue home ibuprofen  - Tylenol as needed as well for pain  FEN/GI: Heart healthy diet PPx: Heparin  Disposition: Overall improved, patient states she feels ready to go home. Will have staff ambulate patient and evaluate for symptoms. Anticipate d/c once echo is completed with close outpatient follow up  Subjective: Patient has no complaints this morning. Feels well.  Objective: Temp:  [97.9 F (36.6 C)-98.3 F (36.8 C)] 98.2 F (36.8 C) (07/13 0546) Pulse Rate:  [58-103] 82 (07/13 0546) Resp:  [14-25] 18 (07/13 0546) BP: (94-134)/(56-86) 107/67 mmHg (07/13 0546) SpO2:  [97 %-100 %] 98 % (07/13 0546) Weight:  [232 lb (105.235 kg)-238 lb 12.8 oz (108.319 kg)] 238 lb (107.956 kg) (07/13 0500)  Physical Exam: General: Sitting up in bed watching TV. Just finished breakfast tray Cardiovascular: RRR, no murmur or bruit appreciated Respiratory: Good effort, CTAB Abdomen: Obese, soft, nontender Extremities: Moves all  extremities  Laboratory:  Recent Labs Lab 08/17/12 1249 08/17/12 1420 08/18/12 0200  WBC  --  6.0 6.7  HGB 14.6 13.2 11.3*  HCT 43.0 39.8 33.1*  PLT  --  251 217    Recent Labs Lab 08/17/12 1420 08/17/12 1618 08/18/12 0200  NA 134* 134* 137  K 3.0* 3.1* 3.1*  CL 103 102 107  CO2 23 21 24   BUN 25* 26* 23  CREATININE 0.93 0.98 0.91  CALCIUM 9.6 9.7 8.9  PROT  --  7.5  --   BILITOT  --  0.7  --   ALKPHOS  --  70  --   ALT  --  60*  --   AST  --  32  --   GLUCOSE 101* 107* 119*   Makyna Niehoff Nydia Bouton, MD 08/18/2012, 5:57 AM PGY-3, [facility] FPTS Intern pager: [phone], text pages welcome

## 2012-08-18 NOTE — Progress Notes (Signed)
FMTS Attending Admission Note: Carla Tiedt,MD I  have seen and examined this patient, reviewed their chart. I have discussed this patient with the resident. I agree with the resident's findings, assessment and care plan.  Patient feels better overall,having breakfast this morning. No telemetry alarm,no feeling of lightheadedness.Examination benign. F/U ECHO today,if normal and patient remains stable may D/C home today.

## 2012-08-18 NOTE — Progress Notes (Signed)
  Echocardiogram 2D Echocardiogram has been performed.  [name] FRANCES 08/18/2012, 5:39 PM

## 2012-08-19 NOTE — Discharge Summary (Signed)
Family Medicine Teaching [facility] Discharge Summary  Patient name: [name] Medical record number: [id number] Date of birth: January 27, 1951 Age: 62 y.o. Gender: female Date of Admission: 08/17/2012  Date of Discharge: 08/18/2012 Admitting Physician: Janit Pagan, MD  Primary Care Provider: No PCP Per Patient Consultants: None  Indication for Hospitalization: Pre-syncope  Discharge Diagnoses/Problem List:  Principal Problem:   Pre-syncope Active Problems:   HYPERTENSION, BENIGN ESSENTIAL   ARTHRITIS, KNEES, BILATERAL   Hypokalemia  Disposition: Home  Discharge Condition: Stable  Brief Hospital Course: Patient is a 62 yo F with PMH of HTN admitted with 4 days of dizziness/lightheadedness after a GI illness. Was found to have dehydration.  1. Presyncope - Most likely secondary to dehydration after GI illness. Given fluids and allowed to take plenty of PO. Since patient has longstanding HTN and evidence of LVH on EKG, an echo was performed which had not been read at time of discharge. Orthostatic vital signs were wnl and no events on telemetry. By following hospital day, patient had much improved. She was feeling stronger and less dizzy. She was able to ambulate without symptoms. She was discharged to home in stable condition with close follow up as an outpatient.   2. HTN - Stable on home medications, which were continued at discharge.  3. Hypokalemia- Supplemented in the hospital with PO potassium.  4. Arthritis of knees - Continued on home regimen of ibuprofen, and Tylenol prn.  Issues for Follow Up: Please follow up results of echo.  Significant Procedures: None  Significant Labs and Imaging:   Recent Labs Lab 08/17/12 1249 08/17/12 1420 08/18/12 0200  WBC  --  6.0 6.7  HGB 14.6 13.2 11.3*  HCT 43.0 39.8 33.1*  PLT  --  251 217    Recent Labs Lab 08/17/12 1249 08/17/12 1420 08/17/12 1618 08/18/12 0200  NA 140 134* 134* 137  K 3.1* 3.0* 3.1* 3.1*  CL  105 103 102 107  CO2  --  23 21 24   GLUCOSE 96 101* 107* 119*  BUN 27* 25* 26* 23  CREATININE 0.90 0.93 0.98 0.91  CALCIUM  --  9.6 9.7 8.9  ALKPHOS  --   --  70  --   AST  --   --  32  --   ALT  --   --  60*  --   ALBUMIN  --   --  3.8  --    Outstanding Results: echo results  Discharge Medications:    Medication List         ibuprofen 800 MG tablet  Commonly known as:  ADVIL,MOTRIN  Take 800 mg by mouth every 8 (eight) hours as needed for pain.     losartan-hydrochlorothiazide 50-12.5 MG per tablet  Commonly known as:  HYZAAR  Take 1 tablet by mouth daily.        Discharge Instructions: Please refer to Patient Instructions section of EMR for full details.  Patient was counseled important signs and symptoms that should prompt return to medical care, changes in medications, dietary instructions, activity restrictions, and follow up appointments.   Follow-Up Appointments: Patient advised to follow up at the urgent care center where she receives her care.    Hilarie Fredrickson, MD 08/19/2012, 9:20 AM PGY-3, [facility]

## 2012-08-19 NOTE — Discharge Summary (Signed)
FMTS Attending Admission Note: Jeaneane Adamec,MD I  have seen and examined this patient, reviewed their chart. I have discussed this patient with the resident. I agree with the resident's findings, assessment and care plan.  

## 2012-08-23 NOTE — ED Provider Notes (Signed)
Patient seen in conjunction with the resident physician. The case is well documented.  On my exam she was in no distress, but w concern for dysrhythmia / early illness, she was admitted for further E/M.  Gerhard Munch, MD 08/23/12 1520

## 2012-09-16 DIAGNOSIS — R635 Abnormal weight gain: Secondary | ICD-10-CM | POA: Diagnosis not present

## 2012-09-16 DIAGNOSIS — I10 Essential (primary) hypertension: Secondary | ICD-10-CM | POA: Diagnosis not present

## 2012-09-16 DIAGNOSIS — R0602 Shortness of breath: Secondary | ICD-10-CM | POA: Diagnosis not present

## 2012-09-19 DIAGNOSIS — E781 Pure hyperglyceridemia: Secondary | ICD-10-CM | POA: Diagnosis not present

## 2012-09-19 DIAGNOSIS — R0602 Shortness of breath: Secondary | ICD-10-CM | POA: Diagnosis not present

## 2012-09-19 DIAGNOSIS — E559 Vitamin D deficiency, unspecified: Secondary | ICD-10-CM | POA: Diagnosis not present

## 2012-10-26 ENCOUNTER — Emergency Department (HOSPITAL_COMMUNITY)
Admission: EM | Admit: 2012-10-26 | Discharge: 2012-10-26 | Disposition: A | Discharge status: Home / Self Care | Payer: Medicare Other | Attending: Emergency Medicine | Admitting: Emergency Medicine

## 2012-10-26 ENCOUNTER — Emergency Department (HOSPITAL_COMMUNITY): Payer: Medicare Other

## 2012-10-26 ENCOUNTER — Encounter (HOSPITAL_COMMUNITY): Payer: Self-pay | Admitting: Emergency Medicine

## 2012-10-26 DIAGNOSIS — I10 Essential (primary) hypertension: Secondary | ICD-10-CM | POA: Insufficient documentation

## 2012-10-26 DIAGNOSIS — Y929 Unspecified place or not applicable: Secondary | ICD-10-CM | POA: Insufficient documentation

## 2012-10-26 DIAGNOSIS — Z23 Encounter for immunization: Secondary | ICD-10-CM | POA: Insufficient documentation

## 2012-10-26 DIAGNOSIS — S99922A Unspecified injury of left foot, initial encounter: Secondary | ICD-10-CM

## 2012-10-26 DIAGNOSIS — S98139A Complete traumatic amputation of one unspecified lesser toe, initial encounter: Secondary | ICD-10-CM | POA: Diagnosis not present

## 2012-10-26 DIAGNOSIS — M129 Arthropathy, unspecified: Secondary | ICD-10-CM | POA: Diagnosis not present

## 2012-10-26 DIAGNOSIS — Y9389 Activity, other specified: Secondary | ICD-10-CM | POA: Insufficient documentation

## 2012-10-26 DIAGNOSIS — Z79899 Other long term (current) drug therapy: Secondary | ICD-10-CM | POA: Insufficient documentation

## 2012-10-26 DIAGNOSIS — S8990XA Unspecified injury of unspecified lower leg, initial encounter: Secondary | ICD-10-CM | POA: Diagnosis not present

## 2012-10-26 DIAGNOSIS — E669 Obesity, unspecified: Secondary | ICD-10-CM | POA: Insufficient documentation

## 2012-10-26 DIAGNOSIS — W298XXA Contact with other powered powered hand tools and household machinery, initial encounter: Secondary | ICD-10-CM | POA: Insufficient documentation

## 2012-10-26 DIAGNOSIS — S91109A Unspecified open wound of unspecified toe(s) without damage to nail, initial encounter: Secondary | ICD-10-CM | POA: Diagnosis not present

## 2012-10-26 MED ORDER — HYDROCODONE-ACETAMINOPHEN 5-325 MG PO TABS: 1.0000 | ORAL_TABLET | Freq: Four times a day (QID) | ORAL | Status: DC | PRN

## 2012-10-26 MED ORDER — TETANUS-DIPHTH-ACELL PERTUSSIS 5-2.5-18.5 LF-MCG/0.5 IM SUSP
0.5000 mL | Freq: Once | INTRAMUSCULAR | Status: AC
  Administered 2012-10-26: 0.5 mL via INTRAMUSCULAR
  Filled 2012-10-26: qty 0.5

## 2012-10-26 MED ORDER — HYDROCODONE-ACETAMINOPHEN 5-325 MG PO TABS
2.0000 | ORAL_TABLET | Freq: Once | ORAL | Status: AC
  Administered 2012-10-26: 2 via ORAL
  Filled 2012-10-26: qty 2

## 2012-10-26 NOTE — ED Provider Notes (Signed)
CSN: [id number]     Arrival date & time 10/26/12  1548 History   First MD Initiated Contact with Patient 10/26/12 1556     Chief Complaint  Patient presents with  . Foot Injury   (Consider location/radiation/quality/duration/timing/severity/associated sxs/prior Treatment) HPI Patient injured her left great toe approximately one hour ago when she was using an Stage manager while wearing sandals. No other injury. She suffered an avulsion of the distal great toe. Pain is moderate to severe localized to her distal left great toe. Made worse with pressing on the area. No other associated symptoms. Pain not improved by anything.  Past Medical History  Diagnosis Date  . Hypertension   . Arthritis    History reviewed. No pertinent past surgical history. History reviewed. No pertinent family history. History  Substance Use Topics  . Smoking status: Never Smoker   . Smokeless tobacco: Not on file  . Alcohol Use: No   OB History   Grav Para Term Preterm Abortions TAB SAB Ect Mult Living                 Review of Systems  Constitutional: Negative.   Skin: Positive for wound.       Wound at left great toe    Allergies  Review of patient's allergies indicates no known allergies.  Home Medications   Current Outpatient Rx  Name  Route  Sig  Dispense  Refill  . ibuprofen (ADVIL,MOTRIN) 800 MG tablet   Oral   Take 800 mg by mouth every 8 (eight) hours as needed for pain.         Marland Kitchen losartan-hydrochlorothiazide (HYZAAR) 50-12.5 MG per tablet   Oral   Take 1 tablet by mouth daily.          BP 154/80  Pulse 84  Temp(Src) 97.8 F (36.6 C) (Oral)  Resp 20  Ht 5\' 3"  (1.6 m)  Wt 240 lb (108.863 kg)  BMI 42.52 kg/m2  SpO2 98% Physical Exam  Nursing note and vitals reviewed. Constitutional: She appears well-developed and well-nourished. No distress.  HENT:  Head: Normocephalic and atraumatic.  Eyes: Conjunctivae are normal. Pupils are equal, round, and reactive to light.   Neck: Neck supple.  Cardiovascular: Normal rate.   Pulmonary/Chest: Effort normal.  Abdominal:  Obese  Musculoskeletal: Normal range of motion.  Left great toe with distal tip avulsed. Distal one fourth of toenail missing. No active bleeding. Distal toe is tender. Good capillary refill.  Neurological: She is alert. Coordination normal.  Skin: Skin is warm and dry. No rash noted.  Psychiatric: She has a normal mood and affect.    ED Course  Procedures (including critical care time) Labs Review Labs Reviewed - No data to display Imaging Review No results found. Digital block performed by me with 2% lidocaine at left great toe Wound was copiously irrigated with sterile saline then antibiotic ointment placed on the wound and a sterile dressing placed.Marland Kitchen She was treated with Norco, 2 tablets. At 6:35 PM she is resting comfortably. X-ray viewed by me. Results for orders placed during the hospital encounter of 08/17/12  CBC      Result Value Range   WBC 6.0  4.0 - 10.5 K/uL   RBC 4.66  3.87 - 5.11 MIL/uL   Hemoglobin 13.2  12.0 - 15.0 g/dL   HCT 40.9  81.1 - 91.4 %   MCV 85.4  78.0 - 100.0 fL   MCH 28.3  26.0 - 34.0 pg   MCHC  33.2  30.0 - 36.0 g/dL   RDW 16.1  09.6 - 04.5 %   Platelets 251  150 - 400 K/uL  BASIC METABOLIC PANEL      Result Value Range   Sodium 134 (*) 135 - 145 mEq/L   Potassium 3.0 (*) 3.5 - 5.1 mEq/L   Chloride 103  96 - 112 mEq/L   CO2 23  19 - 32 mEq/L   Glucose, Bld 101 (*) 70 - 99 mg/dL   BUN 25 (*) 6 - 23 mg/dL   Creatinine, Ser 4.09  0.50 - 1.10 mg/dL   Calcium 9.6  8.4 - 81.1 mg/dL   GFR calc non Af Amer 65 (*) >90 mL/min   GFR calc Af Amer 75 (*) >90 mL/min  COMPREHENSIVE METABOLIC PANEL      Result Value Range   Sodium 134 (*) 135 - 145 mEq/L   Potassium 3.1 (*) 3.5 - 5.1 mEq/L   Chloride 102  96 - 112 mEq/L   CO2 21  19 - 32 mEq/L   Glucose, Bld 107 (*) 70 - 99 mg/dL   BUN 26 (*) 6 - 23 mg/dL   Creatinine, Ser 9.14  0.50 - 1.10 mg/dL    Calcium 9.7  8.4 - 78.2 mg/dL   Total Protein 7.5  6.0 - 8.3 g/dL   Albumin 3.8  3.5 - 5.2 g/dL   AST 32  0 - 37 U/L   ALT 60 (*) 0 - 35 U/L   Alkaline Phosphatase 70  39 - 117 U/L   Total Bilirubin 0.7  0.3 - 1.2 mg/dL   GFR calc non Af Amer 61 (*) >90 mL/min   GFR calc Af Amer 70 (*) >90 mL/min  LIPASE, BLOOD      Result Value Range   Lipase 33  11 - 59 U/L  URINALYSIS, ROUTINE W REFLEX MICROSCOPIC      Result Value Range   Color, Urine YELLOW  YELLOW   APPearance CLEAR  CLEAR   Specific Gravity, Urine 1.014  1.005 - 1.030   pH 5.5  5.0 - 8.0   Glucose, UA NEGATIVE  NEGATIVE mg/dL   Hgb urine dipstick TRACE (*) NEGATIVE   Bilirubin Urine NEGATIVE  NEGATIVE   Ketones, ur NEGATIVE  NEGATIVE mg/dL   Protein, ur NEGATIVE  NEGATIVE mg/dL   Urobilinogen, UA 1.0  0.0 - 1.0 mg/dL   Nitrite NEGATIVE  NEGATIVE   Leukocytes, UA NEGATIVE  NEGATIVE  TSH      Result Value Range   TSH 1.517  0.350 - 4.500 uIU/mL  T3, FREE      Result Value Range   T3, Free 2.4  2.3 - 4.2 pg/mL  URINE MICROSCOPIC-ADD ON      Result Value Range   Squamous Epithelial / LPF RARE  RARE   WBC, UA 0-2  <3 WBC/hpf   RBC / HPF 0-2  <3 RBC/hpf   Bacteria, UA RARE  RARE  TROPONIN I      Result Value Range   Troponin I <0.30  <0.30 ng/mL  TROPONIN I      Result Value Range   Troponin I <0.30  <0.30 ng/mL  HEMOGLOBIN A1C      Result Value Range   Hemoglobin A1C 5.8 (*) <5.7 %   Mean Plasma Glucose 120 (*) <117 mg/dL  BASIC METABOLIC PANEL      Result Value Range   Sodium 137  135 - 145 mEq/L   Potassium 3.1 (*)  3.5 - 5.1 mEq/L   Chloride 107  96 - 112 mEq/L   CO2 24  19 - 32 mEq/L   Glucose, Bld 119 (*) 70 - 99 mg/dL   BUN 23  6 - 23 mg/dL   Creatinine, Ser 1.61  0.50 - 1.10 mg/dL   Calcium 8.9  8.4 - 09.6 mg/dL   GFR calc non Af Amer 66 (*) >90 mL/min   GFR calc Af Amer 77 (*) >90 mL/min  CBC      Result Value Range   WBC 6.7  4.0 - 10.5 K/uL   RBC 3.90  3.87 - 5.11 MIL/uL   Hemoglobin 11.3 (*)  12.0 - 15.0 g/dL   HCT 04.5 (*) 40.9 - 81.1 %   MCV 84.9  78.0 - 100.0 fL   MCH 29.0  26.0 - 34.0 pg   MCHC 34.1  30.0 - 36.0 g/dL   RDW 91.4  78.2 - 95.6 %   Platelets 217  150 - 400 K/uL  LIPID PANEL      Result Value Range   Cholesterol 159  0 - 200 mg/dL   Triglycerides 213  <086 mg/dL   HDL 29 (*) >57 mg/dL   Total CHOL/HDL Ratio 5.5     VLDL 22  0 - 40 mg/dL   LDL Cholesterol 846 (*) 0 - 99 mg/dL  POCT I-STAT TROPONIN I      Result Value Range   Troponin i, poc 0.00  0.00 - 0.08 ng/mL   Comment 3            Dg Toe Great Left  10/26/2012   CLINICAL DATA:  Amputation of left 1st toe well trimming the yard  EXAM: LEFT GREAT TOE, three views  COMPARISON:  None.  FINDINGS: There is soft tissue amputation overlying the tuft of the 1st great toe. The underlying osseous structures appear intact. No acute fracture or subluxation. No radiopaque foreign bodies are soft tissue calcifications.  IMPRESSION: 1. No acute bone abnormality.   Electronically Signed   By: Signa Kell M.D.   On: 10/26/2012 16:59   tdap administered  MDM  No diagnosis found. Plan  Wound care. Prescription Norco. Wound check 2-3 days. Diagnosis avulsion injury of left great toe    Doug Sou, MD 10/26/12 1844

## 2012-10-26 NOTE — ED Notes (Signed)
Pt sts amputated tip of great toe on left foot while using electric clippers today outside; bleeding controlled

## 2012-10-29 DIAGNOSIS — Z23 Encounter for immunization: Secondary | ICD-10-CM | POA: Diagnosis not present

## 2012-10-29 DIAGNOSIS — S91109A Unspecified open wound of unspecified toe(s) without damage to nail, initial encounter: Secondary | ICD-10-CM | POA: Diagnosis not present

## 2012-11-07 DIAGNOSIS — S91109A Unspecified open wound of unspecified toe(s) without damage to nail, initial encounter: Secondary | ICD-10-CM | POA: Diagnosis not present

## 2012-11-07 DIAGNOSIS — I10 Essential (primary) hypertension: Secondary | ICD-10-CM | POA: Diagnosis not present

## 2012-11-15 DIAGNOSIS — S91109A Unspecified open wound of unspecified toe(s) without damage to nail, initial encounter: Secondary | ICD-10-CM | POA: Diagnosis not present

## 2012-11-15 DIAGNOSIS — E119 Type 2 diabetes mellitus without complications: Secondary | ICD-10-CM | POA: Diagnosis not present

## 2012-12-10 DIAGNOSIS — E669 Obesity, unspecified: Secondary | ICD-10-CM | POA: Diagnosis not present

## 2012-12-10 DIAGNOSIS — R7309 Other abnormal glucose: Secondary | ICD-10-CM | POA: Diagnosis not present

## 2012-12-10 DIAGNOSIS — S91109A Unspecified open wound of unspecified toe(s) without damage to nail, initial encounter: Secondary | ICD-10-CM | POA: Diagnosis not present

## 2013-01-25 ENCOUNTER — Encounter (HOSPITAL_COMMUNITY): Payer: Self-pay | Admitting: Emergency Medicine

## 2013-01-25 ENCOUNTER — Emergency Department (HOSPITAL_COMMUNITY): Payer: Medicare Other

## 2013-01-25 ENCOUNTER — Emergency Department (HOSPITAL_COMMUNITY)
Admission: EM | Admit: 2013-01-25 | Discharge: 2013-01-25 | Disposition: A | Discharge status: Home / Self Care | Payer: Medicare Other | Attending: Emergency Medicine | Admitting: Emergency Medicine

## 2013-01-25 DIAGNOSIS — Z87891 Personal history of nicotine dependence: Secondary | ICD-10-CM | POA: Diagnosis not present

## 2013-01-25 DIAGNOSIS — Z79899 Other long term (current) drug therapy: Secondary | ICD-10-CM | POA: Diagnosis not present

## 2013-01-25 DIAGNOSIS — M129 Arthropathy, unspecified: Secondary | ICD-10-CM | POA: Diagnosis not present

## 2013-01-25 DIAGNOSIS — J40 Bronchitis, not specified as acute or chronic: Secondary | ICD-10-CM

## 2013-01-25 DIAGNOSIS — J209 Acute bronchitis, unspecified: Secondary | ICD-10-CM | POA: Diagnosis not present

## 2013-01-25 DIAGNOSIS — I10 Essential (primary) hypertension: Secondary | ICD-10-CM | POA: Diagnosis not present

## 2013-01-25 DIAGNOSIS — R079 Chest pain, unspecified: Secondary | ICD-10-CM | POA: Diagnosis not present

## 2013-01-25 DIAGNOSIS — R071 Chest pain on breathing: Secondary | ICD-10-CM | POA: Diagnosis not present

## 2013-01-25 DIAGNOSIS — R0789 Other chest pain: Secondary | ICD-10-CM

## 2013-01-25 LAB — POCT I-STAT, CHEM 8
Hemoglobin: 12.6 g/dL (ref 12.0–15.0)
Sodium: 142 mEq/L (ref 135–145)
TCO2: 27 mmol/L (ref 0–100)

## 2013-01-25 LAB — POCT I-STAT TROPONIN I: Troponin i, poc: 0 ng/mL (ref 0.00–0.08)

## 2013-01-25 MED ORDER — KETOROLAC TROMETHAMINE 30 MG/ML IJ SOLN
30.0000 mg | Freq: Once | INTRAMUSCULAR | Status: AC
  Administered 2013-01-25: 30 mg via INTRAVENOUS
  Filled 2013-01-25: qty 1

## 2013-01-25 MED ORDER — HYDROCOD POLST-CHLORPHEN POLST 10-8 MG/5ML PO LQCR: 5.0000 mL | Freq: Two times a day (BID) | ORAL | Status: DC | PRN

## 2013-01-25 NOTE — ED Notes (Signed)
Pt. reports mid chest pain / tightness onset last night , slight SOB with occasional dry cough , no nausea or diaphoresis .

## 2013-01-25 NOTE — ED Provider Notes (Signed)
CSN: [id number]     Arrival date & time 01/25/13  4098 History   First MD Initiated Contact with Patient 01/25/13 0350     Chief Complaint  Patient presents with  . Chest Pain   (Consider location/radiation/quality/duration/timing/severity/associated sxs/prior Treatment) HPI Patient is a 63 yo woman with a history of HTN who presents with complaints of a productive cough x 2 to 3 days. She has had about 10 hrs of centrally located "soreness" in her chest. She denies SOB, fever. Pain is worse with coughing. Pain has mild, lingering discomfort after coughing. Pain is nonradiating, 3/10.   Patient denies any history of CAD. States she had a normal cardiac stress test "a couple of years ago". She has a 7 pck year smoking hx and quit 21 years ago. No other CAD RF.   Past Medical History  Diagnosis Date  . Hypertension   . Arthritis    History reviewed. No pertinent past surgical history. No family history on file. History  Substance Use Topics  . Smoking status: Never Smoker   . Smokeless tobacco: Not on file  . Alcohol Use: No   OB History   Grav Para Term Preterm Abortions TAB SAB Ect Mult Living                 Review of Systems 10 point review of systems obtained and is negative with the exception of symptoms noted above.   Allergies  Review of patient's allergies indicates no known allergies.  Home Medications   Current Outpatient Rx  Name  Route  Sig  Dispense  Refill  . HYDROcodone-acetaminophen (NORCO) 5-325 MG per tablet   Oral   Take 1 tablet by mouth every 6 (six) hours as needed for pain.   12 tablet   0   . losartan-hydrochlorothiazide (HYZAAR) 50-12.5 MG per tablet   Oral   Take 1 tablet by mouth daily.          BP 125/71  Pulse 80  Temp(Src) 98.1 F (36.7 C) (Oral)  Resp 16  SpO2 97% Physical Exam Gen: well developed and well nourished appearing Head: NCAT Eyes: PERL, EOMI Nose: no epistaixis or rhinorrhea Mouth/throat: mucosa is moist and  pink Neck: supple, no stridor Chest wall:  TTP on palpation of the sternal region with excacerbation/reproduction of pain Lungs: CTA B, no wheezing, rhonchi or rales CV: RRR, no murmur, good extremity pulses  Abd: soft, notender, nondistended Back: no midline ttp Skin: warm and dry, no lesions Neuro: CN ii-xii grossly intact, no focal deficits, normal speech, normal gait.  Psyche; normal affect,  calm and cooperative.   ED Course  Procedures (including critical care time) Labs Review  Results for orders placed during the hospital encounter of 01/25/13 (from the past 24 hour(s))  POCT I-STAT TROPONIN I     Status: None   Collection Time    01/25/13  4:23 AM      Result Value Range   Troponin i, poc 0.00  0.00 - 0.08 ng/mL   Comment 3           POCT I-STAT, CHEM 8     Status: Abnormal   Collection Time    01/25/13  4:25 AM      Result Value Range   Sodium 142  135 - 145 mEq/L   Potassium 3.8  3.5 - 5.1 mEq/L   Chloride 102  96 - 112 mEq/L   BUN 14  6 - 23 mg/dL   Creatinine,  Ser 0.70  0.50 - 1.10 mg/dL   Glucose, Bld 161 (*) 70 - 99 mg/dL   Calcium, Ion 0.96  0.45 - 1.30 mmol/L   TCO2 27  0 - 100 mmol/L   Hemoglobin 12.6  12.0 - 15.0 g/dL   HCT 40.9  81.1 - 91.4 %   EKG: nsr, no acute ischemic changes, normal intervals, normal axis, normal qrs complex  CXR: normal cardiac silloute, normal appearing mediastinum, no infiltrates, no acute process identified.   MDM  DDX: ACS, pneumothorax, pneumonia, pericardial or pleural effusion, gastritis, GERD/PUD, musculoskeletal pain.   ED work up is non-diagnostic with unremarkable EKG and normal troponin. No evidence of infiltrate on CXR. CP is reproducible with palpation of the chest wall. Suspect that the patient has bronchitis v. URI with chest wall pain secondary to persistent cough. The patient says she is feeling better and wants to go home. She is stable for d/c with plan for close outpatient f/u. Counseled to return to the ED  for red flag sx.   Brandt Loosen, MD 01/25/13 [phone]

## 2013-02-06 DIAGNOSIS — I639 Cerebral infarction, unspecified: Secondary | ICD-10-CM

## 2013-02-06 HISTORY — DX: Cerebral infarction, unspecified: I63.9

## 2013-02-11 DIAGNOSIS — I10 Essential (primary) hypertension: Secondary | ICD-10-CM | POA: Diagnosis not present

## 2013-04-15 DIAGNOSIS — B079 Viral wart, unspecified: Secondary | ICD-10-CM | POA: Diagnosis not present

## 2013-04-15 DIAGNOSIS — I10 Essential (primary) hypertension: Secondary | ICD-10-CM | POA: Diagnosis not present

## 2013-04-21 ENCOUNTER — Encounter (HOSPITAL_COMMUNITY): Payer: Self-pay | Admitting: Emergency Medicine

## 2013-04-21 ENCOUNTER — Emergency Department (HOSPITAL_COMMUNITY): Payer: Medicare Other

## 2013-04-21 DIAGNOSIS — I10 Essential (primary) hypertension: Secondary | ICD-10-CM | POA: Diagnosis not present

## 2013-04-21 DIAGNOSIS — I509 Heart failure, unspecified: Secondary | ICD-10-CM | POA: Diagnosis not present

## 2013-04-21 DIAGNOSIS — R079 Chest pain, unspecified: Secondary | ICD-10-CM | POA: Diagnosis not present

## 2013-04-21 DIAGNOSIS — I2109 ST elevation (STEMI) myocardial infarction involving other coronary artery of anterior wall: Secondary | ICD-10-CM | POA: Diagnosis not present

## 2013-04-21 DIAGNOSIS — M129 Arthropathy, unspecified: Secondary | ICD-10-CM | POA: Insufficient documentation

## 2013-04-21 DIAGNOSIS — R0789 Other chest pain: Secondary | ICD-10-CM | POA: Insufficient documentation

## 2013-04-21 DIAGNOSIS — Z79899 Other long term (current) drug therapy: Secondary | ICD-10-CM | POA: Insufficient documentation

## 2013-04-21 LAB — CBC
HEMATOCRIT: 37.4 % (ref 36.0–46.0)
HEMOGLOBIN: 12.4 g/dL (ref 12.0–15.0)
MCH: 28.7 pg (ref 26.0–34.0)
MCHC: 33.2 g/dL (ref 30.0–36.0)
MCV: 86.6 fL (ref 78.0–100.0)
Platelets: 229 10*3/uL (ref 150–400)
RBC: 4.32 MIL/uL (ref 3.87–5.11)
RDW: 14.1 % (ref 11.5–15.5)
WBC: 6.3 10*3/uL (ref 4.0–10.5)

## 2013-04-21 LAB — I-STAT TROPONIN, ED: Troponin i, poc: 0 ng/mL (ref 0.00–0.08)

## 2013-04-21 LAB — BASIC METABOLIC PANEL
BUN: 13 mg/dL (ref 6–23)
CALCIUM: 9.2 mg/dL (ref 8.4–10.5)
CHLORIDE: 100 meq/L (ref 96–112)
CO2: 27 mEq/L (ref 19–32)
CREATININE: 0.68 mg/dL (ref 0.50–1.10)
GFR calc Af Amer: >90 mL/min (ref >90)
GFR calc non Af Amer: >90 mL/min (ref >90)
GLUCOSE: 99 mg/dL (ref 70–99)
Potassium: 3.6 mEq/L — ABNORMAL LOW (ref 3.7–5.3)
Sodium: 141 mEq/L (ref 137–147)

## 2013-04-21 LAB — PRO B NATRIURETIC PEPTIDE

## 2013-04-21 NOTE — ED Notes (Signed)
Pt. reports intermittent mid chest pressure / tightness for several days with slight SOB , nausea and vomitting .

## 2013-04-22 ENCOUNTER — Emergency Department (HOSPITAL_COMMUNITY)
Admission: EM | Admit: 2013-04-22 | Discharge: 2013-04-22 | Disposition: A | Discharge status: Home / Self Care | Payer: Medicare Other | Attending: Emergency Medicine | Admitting: Emergency Medicine

## 2013-04-22 DIAGNOSIS — R0789 Other chest pain: Secondary | ICD-10-CM

## 2013-04-22 NOTE — ED Notes (Signed)
MD at bedside. 

## 2013-04-22 NOTE — ED Provider Notes (Signed)
CSN: [id number]     Arrival date & time 04/21/13  1906 History   First MD Initiated Contact with Patient 04/22/13 0020     Chief Complaint  Patient presents with  . Chest Pain     (Consider location/radiation/quality/duration/timing/severity/associated sxs/prior Treatment) HPI Comments: Patient is a 63 year old female with history of hypertension and obesity. She presents today with complaints of pressure in her chest which has been going on for the past 3 days. She denies any radiation to the arm or jaw but does feel slightly short of breath. She feels somewhat nauseated but denies diaphoresis. She has no prior cardiac history.  Patient is a 63 y.o. female presenting with chest pain. The history is provided by the patient.  Chest Pain Pain location:  Substernal area Pain quality: tightness   Pain radiates to:  Does not radiate Pain radiates to the back: no   Pain severity:  Moderate Onset quality:  Gradual Duration:  2 days Timing:  Constant Progression:  Worsening Chronicity:  New Context: not breathing and no movement   Relieved by:  Nothing Worsened by:  Nothing tried Ineffective treatments:  None tried   Past Medical History  Diagnosis Date  . Hypertension   . Arthritis    History reviewed. No pertinent past surgical history. No family history on file. History  Substance Use Topics  . Smoking status: Never Smoker   . Smokeless tobacco: Not on file  . Alcohol Use: No   OB History   Grav Para Term Preterm Abortions TAB SAB Ect Mult Living                 Review of Systems  Cardiovascular: Positive for chest pain.  All other systems reviewed and are negative.      Allergies  Review of patient's allergies indicates no known allergies.  Home Medications   Current Outpatient Rx  Name  Route  Sig  Dispense  Refill  . losartan-hydrochlorothiazide (HYZAAR) 50-12.5 MG per tablet   Oral   Take 1 tablet by mouth daily.          BP 155/88  Temp(Src) 98.4  F (36.9 C) (Oral)  Resp 18  SpO2 96% Physical Exam  Nursing note and vitals reviewed. Constitutional: She is oriented to person, place, and time. She appears well-developed and well-nourished. No distress.  HENT:  Head: Normocephalic and atraumatic.  Neck: Normal range of motion. Neck supple.  Cardiovascular: Normal rate and regular rhythm.  Exam reveals no gallop and no friction rub.   No murmur heard. Pulmonary/Chest: Effort normal and breath sounds normal. No respiratory distress. She has no wheezes.  Abdominal: Soft. Bowel sounds are normal. She exhibits no distension. There is no tenderness.  Musculoskeletal: Normal range of motion.  Neurological: She is alert and oriented to person, place, and time.  Skin: Skin is warm and dry. She is not diaphoretic.    ED Course  Procedures (including critical care time) Labs Review Labs Reviewed  BASIC METABOLIC PANEL - Abnormal; Notable for the following:    Potassium 3.6 (*)    All other components within normal limits  CBC  PRO B NATRIURETIC PEPTIDE  I-STAT TROPOININ, ED   Imaging Review Dg Chest 2 View  04/21/2013   CLINICAL DATA:  Abnormal EKG.  EXAM: CHEST  2 VIEW  COMPARISON:  DG CHEST 2 VIEW dated 01/25/2013  FINDINGS: Mediastinum and hilar structures are normal. Mild cardiomegaly and pulmonary vascular prominence noted. Very mild interstitial prominence noted with subtle  Kerley B-lines. Very mild congestive heart failure cannot be excluded. No pleural effusion or pneumothorax. Degenerative changes thoracic spine and both shoulders.  IMPRESSION: Very subtle changes of congestive heart failure.   Electronically Signed   By: Marcello Moores  Register   On: 04/21/2013 21:47     EKG Interpretation   Date/Time:  Monday April 21 2013 19:12:14 EDT Ventricular Rate:  89 PR Interval:  186 QRS Duration: 86 QT Interval:  378 QTC Calculation: 459 R Axis:   55 Text Interpretation:  Normal sinus rhythm T wave abnormality, consider   inferolateral ischemia Abnormal ECG Confirmed by DELOS  MD, Kloey Cazarez  ([id number]) on 04/22/2013 12:29:47 AM      MDM   Final diagnoses:  None    Patient is a 63 year old female who presents with pressure in her chest for the past 3 days. There is no exertional component. Workup today reveals nonspecific T wave inversions which were present on her EKG from July of 2014. Troponin is negative and she is now feeling better. The xray shows possible chf, however the bnp is less than 5.  I discussed the case with Dr. Colon Flattery. She is now pain-free.  Patient to be discharged with cardiology follow up.      Veryl Speak, MD 04/22/13 [phone]

## 2013-04-22 NOTE — Discharge Instructions (Signed)
Followup with cardiology. The number for [facility] cardiology has been provided to you in this discharge summary.  Return to the ER if you develop worsening chest pain, difficulty breathing, or other new and concerning symptoms.   Chest Pain (Nonspecific) It is often hard to give a specific diagnosis for the cause of chest pain. There is always a chance that your pain could be related to something serious, such as a heart attack or a blood clot in the lungs. You need to follow up with your caregiver for further evaluation. CAUSES   Heartburn.  Pneumonia or bronchitis.  Anxiety or stress.  Inflammation around your heart (pericarditis) or lung (pleuritis or pleurisy).  A blood clot in the lung.  A collapsed lung (pneumothorax). It can develop suddenly on its own (spontaneous pneumothorax) or from injury (trauma) to the chest.  Shingles infection (herpes zoster virus). The chest wall is composed of bones, muscles, and cartilage. Any of these can be the source of the pain.  The bones can be bruised by injury.  The muscles or cartilage can be strained by coughing or overwork.  The cartilage can be affected by inflammation and become sore (costochondritis). DIAGNOSIS  Lab tests or other studies, such as X-rays, electrocardiography, stress testing, or cardiac imaging, may be needed to find the cause of your pain.  TREATMENT   Treatment depends on what may be causing your chest pain. Treatment may include:  Acid blockers for heartburn.  Anti-inflammatory medicine.  Pain medicine for inflammatory conditions.  Antibiotics if an infection is present.  You may be advised to change lifestyle habits. This includes stopping smoking and avoiding alcohol, caffeine, and chocolate.  You may be advised to keep your head raised (elevated) when sleeping. This reduces the chance of acid going backward from your stomach into your esophagus.  Most of the time, nonspecific chest pain will improve  within 2 to 3 days with rest and mild pain medicine. HOME CARE INSTRUCTIONS   If antibiotics were prescribed, take your antibiotics as directed. Finish them even if you start to feel better.  For the next few days, avoid physical activities that bring on chest pain. Continue physical activities as directed.  Do not smoke.  Avoid drinking alcohol.  Only take over-the-counter or prescription medicine for pain, discomfort, or fever as directed by your caregiver.  Follow your caregiver's suggestions for further testing if your chest pain does not go away.  Keep any follow-up appointments you made. If you do not go to an appointment, you could develop lasting (chronic) problems with pain. If there is any problem keeping an appointment, you must call to reschedule. SEEK MEDICAL CARE IF:   You think you are having problems from the medicine you are taking. Read your medicine instructions carefully.  Your chest pain does not go away, even after treatment.  You develop a rash with blisters on your chest. SEEK IMMEDIATE MEDICAL CARE IF:   You have increased chest pain or pain that spreads to your arm, neck, jaw, back, or abdomen.  You develop shortness of breath, an increasing cough, or you are coughing up blood.  You have severe back or abdominal pain, feel nauseous, or vomit.  You develop severe weakness, fainting, or chills.  You have a fever. THIS IS AN EMERGENCY. Do not wait to see if the pain will go away. Get medical help at once. Call your local emergency services (911 in [place] Do not drive yourself to the hospital. MAKE SURE YOU:  Understand these instructions.  Will watch your condition.  Will get help right away if you are not doing well or get worse. Document Released: 11/02/2004 Document Revised: 04/17/2011 Document Reviewed: 08/29/2007 [facility] Patient Information 2014 [place].

## 2013-05-12 ENCOUNTER — Encounter (HOSPITAL_COMMUNITY): Payer: Self-pay | Admitting: Emergency Medicine

## 2013-05-12 ENCOUNTER — Emergency Department (HOSPITAL_COMMUNITY)
Admission: EM | Admit: 2013-05-12 | Discharge: 2013-05-12 | Disposition: A | Discharge status: Home / Self Care | Payer: Medicare Other | Attending: Emergency Medicine | Admitting: Emergency Medicine

## 2013-05-12 DIAGNOSIS — M542 Cervicalgia: Secondary | ICD-10-CM | POA: Diagnosis present

## 2013-05-12 DIAGNOSIS — X58XXXA Exposure to other specified factors, initial encounter: Secondary | ICD-10-CM | POA: Diagnosis not present

## 2013-05-12 DIAGNOSIS — S139XXA Sprain of joints and ligaments of unspecified parts of neck, initial encounter: Secondary | ICD-10-CM | POA: Insufficient documentation

## 2013-05-12 DIAGNOSIS — Z79899 Other long term (current) drug therapy: Secondary | ICD-10-CM | POA: Insufficient documentation

## 2013-05-12 DIAGNOSIS — Y929 Unspecified place or not applicable: Secondary | ICD-10-CM | POA: Diagnosis not present

## 2013-05-12 DIAGNOSIS — Y939 Activity, unspecified: Secondary | ICD-10-CM | POA: Diagnosis not present

## 2013-05-12 DIAGNOSIS — Z791 Long term (current) use of non-steroidal anti-inflammatories (NSAID): Secondary | ICD-10-CM | POA: Insufficient documentation

## 2013-05-12 DIAGNOSIS — S161XXA Strain of muscle, fascia and tendon at neck level, initial encounter: Secondary | ICD-10-CM

## 2013-05-12 DIAGNOSIS — M129 Arthropathy, unspecified: Secondary | ICD-10-CM | POA: Diagnosis not present

## 2013-05-12 DIAGNOSIS — I10 Essential (primary) hypertension: Secondary | ICD-10-CM | POA: Insufficient documentation

## 2013-05-12 MED ORDER — KETOROLAC TROMETHAMINE 60 MG/2ML IM SOLN
30.0000 mg | Freq: Once | INTRAMUSCULAR | Status: AC
  Administered 2013-05-12: 30 mg via INTRAMUSCULAR
  Filled 2013-05-12: qty 2

## 2013-05-12 MED ORDER — CYCLOBENZAPRINE HCL 10 MG PO TABS: 5.0000 mg | ORAL_TABLET | Freq: Two times a day (BID) | ORAL | Status: DC | PRN

## 2013-05-12 MED ORDER — NAPROXEN 375 MG PO TABS: 375.0000 mg | ORAL_TABLET | Freq: Two times a day (BID) | ORAL | Status: DC

## 2013-05-12 NOTE — Discharge Instructions (Signed)

## 2013-05-12 NOTE — ED Notes (Signed)
Pt states she has been having neck pain for about a week now just progressively getting worse. No known injury just thinks it is sinus stuff. States the neck pain is posterior

## 2013-05-12 NOTE — ED Provider Notes (Signed)
CSN: [id number]     Arrival date & time 05/12/13  0730 History   First MD Initiated Contact with Patient 05/12/13 [phone]     Chief Complaint  Patient presents with  . Neck Pain     (Consider location/radiation/quality/duration/timing/severity/associated sxs/prior Treatment) HPI  Patient presents to the ER with complaints of neck pain for the past week that has been progressively worsening. Last night while sleeping around 1 am it got acutely worse which prompted her ER visit. The pain has relieved some since last night. She describes it as bilateral, worse on the right. Shooting pains up into the base of her skull. She feels as if it is tight and she slept wrong. She tried Aleve without relief. She wanted to use her sisters muscle relaxer's but she could not find them. She has not been feeling sick with fevers, headache, cough, chills, confusion, weight loss. No upper extremity weakness, numbness or tingling. She works as a Chartered certified accountant.   Past Medical History  Diagnosis Date  . Hypertension   . Arthritis    Past Surgical History  Procedure Laterality Date  . Joint replacement    . Replacement total knee Left    No family history on file. History  Substance Use Topics  . Smoking status: Never Smoker   . Smokeless tobacco: Not on file  . Alcohol Use: No   OB History   Grav Para Term Preterm Abortions TAB SAB Ect Mult Living                 Review of Systems  Review of Systems  Gen: no weight loss, fevers, chills, night sweats  Eyes: no discharge or drainage, no occular pain or visual changes  Nose: no epistaxis or rhinorrhea  Mouth: no dental pain, no sore throat  Neck: + neck pain  Lungs:No wheezing, coughing or hemoptysis CV: no chest pain, palpitations, dependent edema or orthopnea  Abd: no abdominal pain, nausea, vomiting, diarrhea GU: no dysuria or gross hematuria  MSK:  No muscle weakness or pain Neuro: no headache, no focal neurologic deficits  Skin: no rash or  wounds Psyche: no complaints     Allergies  Review of patient's allergies indicates no known allergies.  Home Medications   Current Outpatient Rx  Name  Route  Sig  Dispense  Refill  . losartan-hydrochlorothiazide (HYZAAR) 50-12.5 MG per tablet   Oral   Take 1 tablet by mouth daily.         . naproxen sodium (ANAPROX) 220 MG tablet   Oral   Take 440 mg by mouth daily as needed (headache).         . cyclobenzaprine (FLEXERIL) 10 MG tablet   Oral   Take 0.5-1 tablets (5-10 mg total) by mouth 2 (two) times daily as needed for muscle spasms.   20 tablet   0   . naproxen (NAPROSYN) 375 MG tablet   Oral   Take 1 tablet (375 mg total) by mouth 2 (two) times daily.   20 tablet   0    BP 144/82  Pulse 82  Temp(Src) 97.9 F (36.6 C) (Oral)  Resp 20  Ht 5\' 3"  (1.6 m)  Wt 250 lb 8 oz (113.626 kg)  BMI 44.39 kg/m2  SpO2 97% Physical Exam  Nursing note and vitals reviewed. Constitutional: She appears well-developed and well-nourished. No distress.  HENT:  Head: Normocephalic and atraumatic.  Eyes: Pupils are equal, round, and reactive to light.  Neck: Normal range of  motion. Neck supple. Muscular tenderness present. No spinous process tenderness present. No Brudzinski's sign and no Kernig's sign noted.  Paraspinal muscle tightness with mild pain to palpation, worse on the right. No midline tenderness.   Cardiovascular: Normal rate and regular rhythm.   Pulmonary/Chest: Effort normal. She has no wheezes. She has no rales.  Abdominal: Soft.  Musculoskeletal:  No upper extremity weakness or swelling. Sensations intact bilaterally.  Neurological: She is alert. She has normal strength. No sensory deficit.  Skin: Skin is warm and dry.      ED Course  Procedures (including critical care time) Labs Review Labs Reviewed - No data to display Imaging Review No results found.   EKG Interpretation None      MDM   Final diagnoses:  Cervical muscle strain    Ms.  Sprague has been given IM Toradol 30 mg for pain. She has no neurological deficits, no weakness in her arms, numbness or tingling bilaterally. She has no headaches or fevers. No confusion, difficulty speaking, ambulating, swallowing or sore throat. She was given a shot of 30 mg IM Toradol which relieved her symptoms significantly. She now has full ROM and no more shooting pains into the base of her skull. She is happy and feels ready to go home. Will discharge with Naprosyn and Flexeril. Referral to ORtho given if symptoms persist. Return to ED immediately if flag symptoms reoccur.  63 y.o.Shell C Behrmann's evaluation in the Emergency Department is complete. It has been determined that no acute conditions requiring further emergency intervention are present at this time. The patient/guardian have been advised of the diagnosis and plan. We have discussed signs and symptoms that warrant return to the ED, such as changes or worsening in symptoms.  Vital signs are stable at discharge. Filed Vitals:   05/12/13 0736  BP: 144/82  Pulse: 82  Temp: 97.9 F (36.6 C)  Resp: 20    Patient/guardian has voiced understanding and agreed to follow-up with the PCP or specialist.    Linus Mako, PA-C 05/12/13 [phone]

## 2013-05-12 NOTE — ED Provider Notes (Signed)
Medical screening examination/treatment/procedure(s) were performed by non-physician practitioner and as supervising physician I was immediately available for consultation/collaboration.   EKG Interpretation None          Hoy Morn, MD 05/12/13 [phone]

## 2013-05-12 NOTE — ED Notes (Signed)
Carlota Raspberry PA at bedside.

## 2013-07-27 ENCOUNTER — Emergency Department (HOSPITAL_COMMUNITY)
Admission: EM | Admit: 2013-07-27 | Discharge: 2013-07-27 | Disposition: A | Discharge status: Home / Self Care | Payer: Medicare Other | Attending: Emergency Medicine | Admitting: Emergency Medicine

## 2013-07-27 ENCOUNTER — Encounter (HOSPITAL_COMMUNITY): Payer: Self-pay | Admitting: Emergency Medicine

## 2013-07-27 DIAGNOSIS — M129 Arthropathy, unspecified: Secondary | ICD-10-CM | POA: Diagnosis not present

## 2013-07-27 DIAGNOSIS — Z791 Long term (current) use of non-steroidal anti-inflammatories (NSAID): Secondary | ICD-10-CM | POA: Diagnosis not present

## 2013-07-27 DIAGNOSIS — M545 Low back pain, unspecified: Secondary | ICD-10-CM

## 2013-07-27 DIAGNOSIS — I10 Essential (primary) hypertension: Secondary | ICD-10-CM | POA: Diagnosis not present

## 2013-07-27 DIAGNOSIS — Z79899 Other long term (current) drug therapy: Secondary | ICD-10-CM | POA: Diagnosis not present

## 2013-07-27 MED ORDER — METHOCARBAMOL 500 MG PO TABS: 1000.0000 mg | ORAL_TABLET | Freq: Four times a day (QID) | ORAL | Status: DC

## 2013-07-27 MED ORDER — NAPROXEN 500 MG PO TABS: 500.0000 mg | ORAL_TABLET | Freq: Two times a day (BID) | ORAL | Status: DC

## 2013-07-27 MED ORDER — HYDROCODONE-ACETAMINOPHEN 5-325 MG PO TABS: ORAL_TABLET | ORAL | Status: DC

## 2013-07-27 NOTE — ED Notes (Signed)
Pt presents to department for evaluation of back pain. States she fell at home Thursday, states she missed step and fell down. Now states soreness to back. 8/10 pain, becomes worse with movement. Pt is alert and oriented x4.

## 2013-07-27 NOTE — ED Provider Notes (Signed)
CSN: [id number]     Arrival date & time 07/27/13  1436 History  This chart was scribed for Carlisle Cater, PA-C working with Shaune Pollack, MD by Randa Evens, ED Scribe. This patient was seen in room TR07C/TR07C and the patient's care was started at 4:01 PM.    Chief Complaint  Patient presents with  . Back Pain   Patient is a 63 y.o. female presenting with back pain. The history is provided by the patient. No language interpreter was used.  Back Pain Location:  Lumbar spine Quality:  Aching Radiates to:  Does not radiate Pain severity:  Mild Duration:  13 hours Associated symptoms: no bladder incontinence, no bowel incontinence, no dysuria, no fever, no numbness, no pelvic pain, no weakness and no weight loss    HPI Comments: [name] is a 63 y.o. female who presents to the Emergency Department complaining of constant aching lower back pain onset 3AM. She denies any injury to her back. She states she has had similar symptoms before but they never lasted this long. She states the pain does not radiate. She states she took half of one muscle relaxer with some relief. She denies numbness, bowel incontinence, fever, or weight change.   Past Medical History  Diagnosis Date  . Hypertension   . Arthritis    Past Surgical History  Procedure Laterality Date  . Joint replacement    . Replacement total knee Left    No family history on file. History  Substance Use Topics  . Smoking status: Never Smoker   . Smokeless tobacco: Not on file  . Alcohol Use: No   OB History   Grav Para Term Preterm Abortions TAB SAB Ect Mult Living                 Review of Systems  Constitutional: Negative for fever, weight loss, fatigue and unexpected weight change.  Gastrointestinal: Negative for constipation and bowel incontinence.       Negative for fecal incontinence.   Genitourinary: Negative for bladder incontinence, dysuria, hematuria, flank pain, vaginal bleeding, vaginal discharge and  pelvic pain.       Negative for urinary incontinence or retention.  Musculoskeletal: Positive for back pain.  Neurological: Negative for weakness and numbness.       Denies saddle paresthesias.    Allergies  Review of patient's allergies indicates no known allergies.  Home Medications   Prior to Admission medications   Medication Sig Start Date End Date Taking? Authorizing Poonam Woehrle  cyclobenzaprine (FLEXERIL) 10 MG tablet Take 0.5-1 tablets (5-10 mg total) by mouth 2 (two) times daily as needed for muscle spasms. 05/12/13   Tiffany Marilu Favre, PA-C  losartan-hydrochlorothiazide (HYZAAR) 50-12.5 MG per tablet Take 1 tablet by mouth daily.    Historical Trisa Cranor, MD  naproxen (NAPROSYN) 375 MG tablet Take 1 tablet (375 mg total) by mouth 2 (two) times daily. 05/12/13   Tiffany Marilu Favre, PA-C  naproxen sodium (ANAPROX) 220 MG tablet Take 440 mg by mouth daily as needed (headache).    Historical Marwah Disbro, MD   Triage Vitals: BP 116/68  Pulse 106  Temp(Src) 98 F (36.7 C) (Oral)  Resp 22  SpO2 98%  Physical Exam  Nursing note and vitals reviewed. Constitutional: She appears well-developed and well-nourished. No distress.  HENT:  Head: Normocephalic and atraumatic.  Eyes: Conjunctivae and EOM are normal.  Neck: Normal range of motion. Neck supple. No tracheal deviation present.  Cardiovascular: Normal rate.   Pulmonary/Chest: Effort  normal. No respiratory distress.  Abdominal: Soft. There is no tenderness. There is no CVA tenderness.  Musculoskeletal: Normal range of motion. She exhibits tenderness.  No midline tenderness, some lower Paraspinous lumbar tenderness  Neurological: She is alert. She has normal strength and normal reflexes. No sensory deficit.  5/5 strength in entire lower extremities bilaterally. No sensation deficit.   Skin: Skin is warm and dry. No rash noted.  Psychiatric: She has a normal mood and affect. Her behavior is normal.    ED Course  Procedures (including  critical care time) DIAGNOSTIC STUDIES: Oxygen Saturation is 98% on RA, normal by my interpretation.    COORDINATION OF CARE:    Labs Review Labs Reviewed - No data to display  Imaging Review No results found.   EKG Interpretation None      Patient seen and examined.   Vital signs reviewed and are as follows: Filed Vitals:   07/27/13 1445  BP: 116/68  Pulse: 106  Temp: 98 F (36.7 C)  Resp: 22   No red flag s/s of low back pain. Patient was counseled on back pain precautions and told to do activity as tolerated but do not lift, push, or pull heavy objects more than 10 pounds for the next week.  Patient counseled to use ice or heat on back for no longer than 15 minutes every hour.   Patient prescribed muscle relaxer and counseled on proper use of muscle relaxant medication.    Patient prescribed narcotic pain medicine and counseled on proper use of narcotic pain medications. Counseled not to combine this medication with others containing tylenol.   Urged patient not to drink alcohol, drive, or perform any other activities that requires focus while taking either of these medications.  Patient urged to follow-up with PCP if pain does not improve with treatment and rest or if pain becomes recurrent. Urged to return with worsening severe pain, loss of bowel or bladder control, trouble walking.   The patient verbalizes understanding and agrees with the plan.   MDM   Final diagnoses:  Bilateral low back pain without sciatica   Patient with back pain. No neurological deficits. Patient is ambulatory. No warning symptoms of back pain including: loss of bowel or bladder control, night sweats, waking from sleep with back pain, unexplained fevers or weight loss, h/o cancer, IVDU, recent trauma. No concern for cauda equina, epidural abscess, or other serious cause of back pain. Conservative measures such as rest, ice/heat and pain medicine indicated with PCP follow-up if no  improvement with conservative management. As pain has been present for less than 24 hrs without injury or any concerning features, do not feel imaging indicated in this low-risk patient with good PCP f/u.   I personally performed the services described in this documentation, which was scribed in my presence. The recorded information has been reviewed and is accurate.      Carlisle Cater, PA-C 07/27/13 [phone]

## 2013-07-27 NOTE — Discharge Instructions (Signed)
Please read and follow all provided instructions.  Your diagnoses today include:  1. Bilateral low back pain without sciatica     Tests performed today include:  Vital signs - see below for your results today  Medications prescribed:   Robaxin (methocarbamol) - muscle relaxer medication  DO NOT drive or perform any activities that require you to be awake and alert because this medicine can make you drowsy.    Vicodin (hydrocodone/acetaminophen) - narcotic pain medication  DO NOT drive or perform any activities that require you to be awake and alert because this medicine can make you drowsy. BE VERY CAREFUL not to take multiple medicines containing Tylenol (also called acetaminophen). Doing so can lead to an overdose which can damage your liver and cause liver failure and possibly death.   Naproxen - anti-inflammatory pain medication  Do not exceed 500mg  naproxen every 12 hours, take with food  You have been prescribed an anti-inflammatory medication or NSAID. Take with food. Take smallest effective dose for the shortest duration needed for your pain. Stop taking if you experience stomach pain or vomiting.   Take any prescribed medications only as directed.  Home care instructions:   Follow any educational materials contained in this packet  Please rest, use ice or heat on your back for the next several days  Do not lift, push, pull anything more than 10 pounds for the next week  Follow-up instructions: Please follow-up with your primary care provider in the next 1 week for further evaluation of your symptoms. If you do not have a primary care doctor -- see below for referral information.   Return instructions:  SEEK IMMEDIATE MEDICAL ATTENTION IF YOU HAVE:  New numbness, tingling, weakness, or problem with the use of your arms or legs  Severe back pain not relieved with medications  Loss control of your bowels or bladder  Increasing pain in any areas of the body  (such as chest or abdominal pain)  Shortness of breath, dizziness, or fainting.   Worsening nausea (feeling sick to your stomach), vomiting, fever, or sweats  Any other emergent concerns regarding your health   Additional Information:  Your vital signs today were: BP 116/68   Pulse 106   Temp(Src) 98 F (36.7 C) (Oral)   Resp 22   SpO2 98% If your blood pressure (BP) was elevated above 135/85 this visit, please have this repeated by your doctor within one month. --------------

## 2013-08-01 NOTE — ED Provider Notes (Signed)
History/physical exam/procedure(s) were performed by non-physician practitioner and as supervising physician I was immediately available for consultation/collaboration. I have reviewed all notes and am in agreement with care and plan.   Shaune Pollack, MD 08/01/13 [phone]

## 2013-08-23 ENCOUNTER — Encounter (HOSPITAL_COMMUNITY): Payer: Self-pay | Admitting: Emergency Medicine

## 2013-08-23 ENCOUNTER — Emergency Department (HOSPITAL_COMMUNITY): Payer: Medicare Other

## 2013-08-23 ENCOUNTER — Inpatient Hospital Stay (HOSPITAL_COMMUNITY): Payer: Medicare Other

## 2013-08-23 ENCOUNTER — Inpatient Hospital Stay (HOSPITAL_COMMUNITY)
Admission: EM | Admit: 2013-08-23 | Discharge: 2013-08-26 | DRG: 065 | Disposition: A | Discharge status: Rehabilitation Facility | Payer: Medicare Other | Attending: Internal Medicine | Admitting: Internal Medicine

## 2013-08-23 DIAGNOSIS — I5032 Chronic diastolic (congestive) heart failure: Secondary | ICD-10-CM | POA: Diagnosis present

## 2013-08-23 DIAGNOSIS — I10 Essential (primary) hypertension: Secondary | ICD-10-CM

## 2013-08-23 DIAGNOSIS — I5030 Unspecified diastolic (congestive) heart failure: Secondary | ICD-10-CM | POA: Diagnosis present

## 2013-08-23 DIAGNOSIS — R5381 Other malaise: Secondary | ICD-10-CM | POA: Diagnosis not present

## 2013-08-23 DIAGNOSIS — R7309 Other abnormal glucose: Secondary | ICD-10-CM | POA: Diagnosis present

## 2013-08-23 DIAGNOSIS — J45909 Unspecified asthma, uncomplicated: Secondary | ICD-10-CM

## 2013-08-23 DIAGNOSIS — Z8673 Personal history of transient ischemic attack (TIA), and cerebral infarction without residual deficits: Secondary | ICD-10-CM | POA: Diagnosis present

## 2013-08-23 DIAGNOSIS — E669 Obesity, unspecified: Secondary | ICD-10-CM | POA: Diagnosis present

## 2013-08-23 DIAGNOSIS — I739 Peripheral vascular disease, unspecified: Secondary | ICD-10-CM | POA: Diagnosis not present

## 2013-08-23 DIAGNOSIS — R209 Unspecified disturbances of skin sensation: Secondary | ICD-10-CM | POA: Diagnosis present

## 2013-08-23 DIAGNOSIS — Z96659 Presence of unspecified artificial knee joint: Secondary | ICD-10-CM | POA: Diagnosis not present

## 2013-08-23 DIAGNOSIS — Z791 Long term (current) use of non-steroidal anti-inflammatories (NSAID): Secondary | ICD-10-CM

## 2013-08-23 DIAGNOSIS — R471 Dysarthria and anarthria: Secondary | ICD-10-CM | POA: Diagnosis present

## 2013-08-23 DIAGNOSIS — G8190 Hemiplegia, unspecified affecting unspecified side: Secondary | ICD-10-CM | POA: Diagnosis present

## 2013-08-23 DIAGNOSIS — I5031 Acute diastolic (congestive) heart failure: Secondary | ICD-10-CM

## 2013-08-23 DIAGNOSIS — I509 Heart failure, unspecified: Secondary | ICD-10-CM | POA: Diagnosis present

## 2013-08-23 DIAGNOSIS — IMO0002 Reserved for concepts with insufficient information to code with codable children: Secondary | ICD-10-CM

## 2013-08-23 DIAGNOSIS — I6350 Cerebral infarction due to unspecified occlusion or stenosis of unspecified cerebral artery: Secondary | ICD-10-CM

## 2013-08-23 DIAGNOSIS — M129 Arthropathy, unspecified: Secondary | ICD-10-CM | POA: Diagnosis present

## 2013-08-23 DIAGNOSIS — I517 Cardiomegaly: Secondary | ICD-10-CM | POA: Diagnosis not present

## 2013-08-23 DIAGNOSIS — R7303 Prediabetes: Secondary | ICD-10-CM | POA: Diagnosis present

## 2013-08-23 DIAGNOSIS — E876 Hypokalemia: Secondary | ICD-10-CM | POA: Diagnosis not present

## 2013-08-23 DIAGNOSIS — F411 Generalized anxiety disorder: Secondary | ICD-10-CM | POA: Diagnosis present

## 2013-08-23 DIAGNOSIS — M654 Radial styloid tenosynovitis [de Quervain]: Secondary | ICD-10-CM

## 2013-08-23 DIAGNOSIS — G930 Cerebral cysts: Secondary | ICD-10-CM | POA: Diagnosis not present

## 2013-08-23 DIAGNOSIS — E785 Hyperlipidemia, unspecified: Secondary | ICD-10-CM | POA: Diagnosis present

## 2013-08-23 DIAGNOSIS — B373 Candidiasis of vulva and vagina: Secondary | ICD-10-CM

## 2013-08-23 DIAGNOSIS — R05 Cough: Secondary | ICD-10-CM

## 2013-08-23 DIAGNOSIS — B3731 Acute candidiasis of vulva and vagina: Secondary | ICD-10-CM

## 2013-08-23 DIAGNOSIS — G4733 Obstructive sleep apnea (adult) (pediatric): Secondary | ICD-10-CM | POA: Diagnosis present

## 2013-08-23 DIAGNOSIS — I639 Cerebral infarction, unspecified: Secondary | ICD-10-CM

## 2013-08-23 DIAGNOSIS — I6330 Cerebral infarction due to thrombosis of unspecified cerebral artery: Secondary | ICD-10-CM | POA: Diagnosis not present

## 2013-08-23 DIAGNOSIS — R059 Cough, unspecified: Secondary | ICD-10-CM

## 2013-08-23 DIAGNOSIS — Z6841 Body Mass Index (BMI) 40.0 and over, adult: Secondary | ICD-10-CM | POA: Diagnosis not present

## 2013-08-23 DIAGNOSIS — G40909 Epilepsy, unspecified, not intractable, without status epilepticus: Secondary | ICD-10-CM | POA: Diagnosis not present

## 2013-08-23 DIAGNOSIS — Z5189 Encounter for other specified aftercare: Secondary | ICD-10-CM | POA: Diagnosis not present

## 2013-08-23 DIAGNOSIS — I6789 Other cerebrovascular disease: Secondary | ICD-10-CM | POA: Diagnosis not present

## 2013-08-23 DIAGNOSIS — R55 Syncope and collapse: Secondary | ICD-10-CM

## 2013-08-23 DIAGNOSIS — D649 Anemia, unspecified: Secondary | ICD-10-CM | POA: Diagnosis not present

## 2013-08-23 DIAGNOSIS — L919 Hypertrophic disorder of the skin, unspecified: Secondary | ICD-10-CM

## 2013-08-23 DIAGNOSIS — K029 Dental caries, unspecified: Secondary | ICD-10-CM

## 2013-08-23 DIAGNOSIS — L909 Atrophic disorder of skin, unspecified: Secondary | ICD-10-CM

## 2013-08-23 LAB — I-STAT CHEM 8, ED
BUN: 9 mg/dL (ref 6–23)
CALCIUM ION: 1.14 mmol/L (ref 1.13–1.30)
Chloride: 105 mEq/L (ref 96–112)
Creatinine, Ser: 0.7 mg/dL (ref 0.50–1.10)
Glucose, Bld: 120 mg/dL — ABNORMAL HIGH (ref 70–99)
HEMATOCRIT: 37 % (ref 36.0–46.0)
Hemoglobin: 12.6 g/dL (ref 12.0–15.0)
POTASSIUM: 3.6 meq/L — AB (ref 3.7–5.3)
SODIUM: 142 meq/L (ref 137–147)
TCO2: 23 mmol/L (ref 0–100)

## 2013-08-23 LAB — MRSA PCR SCREENING: MRSA BY PCR: NEGATIVE

## 2013-08-23 LAB — DIFFERENTIAL
BASOS ABS: 0 10*3/uL (ref 0.0–0.1)
BASOS PCT: 1 % (ref 0–1)
EOS ABS: 0.3 10*3/uL (ref 0.0–0.7)
Eosinophils Relative: 6 % — ABNORMAL HIGH (ref 0–5)
Lymphocytes Relative: 53 % — ABNORMAL HIGH (ref 12–46)
Lymphs Abs: 2.6 10*3/uL (ref 0.7–4.0)
MONO ABS: 0.5 10*3/uL (ref 0.1–1.0)
MONOS PCT: 10 % (ref 3–12)
Neutro Abs: 1.4 10*3/uL — ABNORMAL LOW (ref 1.7–7.7)
Neutrophils Relative %: 30 % — ABNORMAL LOW (ref 43–77)

## 2013-08-23 LAB — COMPREHENSIVE METABOLIC PANEL
ALBUMIN: 3.4 g/dL — AB (ref 3.5–5.2)
ALT: 21 U/L (ref 0–35)
ANION GAP: 14 (ref 5–15)
AST: 14 U/L (ref 0–37)
Alkaline Phosphatase: 63 U/L (ref 39–117)
BUN: 10 mg/dL (ref 6–23)
CO2: 25 mEq/L (ref 19–32)
CREATININE: 0.64 mg/dL (ref 0.50–1.10)
Calcium: 8.6 mg/dL (ref 8.4–10.5)
Chloride: 104 mEq/L (ref 96–112)
GFR calc Af Amer: >90 mL/min (ref >90)
GFR calc non Af Amer: >90 mL/min (ref >90)
Glucose, Bld: 118 mg/dL — ABNORMAL HIGH (ref 70–99)
Potassium: 3.7 mEq/L (ref 3.7–5.3)
Sodium: 143 mEq/L (ref 137–147)
TOTAL PROTEIN: 7.2 g/dL (ref 6.0–8.3)
Total Bilirubin: 0.4 mg/dL (ref 0.3–1.2)

## 2013-08-23 LAB — CBC
HCT: 35.3 % — ABNORMAL LOW (ref 36.0–46.0)
HEMOGLOBIN: 11.4 g/dL — AB (ref 12.0–15.0)
MCH: 28.4 pg (ref 26.0–34.0)
MCHC: 32.3 g/dL (ref 30.0–36.0)
MCV: 87.8 fL (ref 78.0–100.0)
Platelets: 213 10*3/uL (ref 150–400)
RBC: 4.02 MIL/uL (ref 3.87–5.11)
RDW: 14.4 % (ref 11.5–15.5)
WBC: 4.8 10*3/uL (ref 4.0–10.5)

## 2013-08-23 LAB — HEMOGLOBIN A1C
HEMOGLOBIN A1C: 6 % — AB (ref <5.7)
Mean Plasma Glucose: 126 mg/dL — ABNORMAL HIGH (ref <117)

## 2013-08-23 LAB — CBG MONITORING, ED: GLUCOSE-CAPILLARY: 123 mg/dL — AB (ref 70–99)

## 2013-08-23 LAB — I-STAT TROPONIN, ED: Troponin i, poc: 0 ng/mL (ref 0.00–0.08)

## 2013-08-23 LAB — PROTIME-INR
INR: 1.15 (ref 0.00–1.49)
Prothrombin Time: 14.7 seconds (ref 11.6–15.2)

## 2013-08-23 LAB — ETHANOL

## 2013-08-23 LAB — APTT: aPTT: 31 seconds (ref 24–37)

## 2013-08-23 MED ORDER — STUDY - INVESTIGATIONAL DRUG SIMPLE RECORD
75.0000 mg | Freq: Every day | Status: DC
  Administered 2013-08-24 – 2013-08-26 (×3): 75 mg via ORAL
  Filled 2013-08-23 (×3): qty 75

## 2013-08-23 MED ORDER — ASPIRIN EC 81 MG PO TBEC
81.0000 mg | DELAYED_RELEASE_TABLET | Freq: Every day | ORAL | Status: DC
Start: 2013-08-23 — End: 2013-08-26
  Administered 2013-08-23 – 2013-08-26 (×4): 81 mg via ORAL
  Filled 2013-08-23 (×4): qty 1

## 2013-08-23 MED ORDER — ENOXAPARIN SODIUM 40 MG/0.4ML ~~LOC~~ SOLN
40.0000 mg | SUBCUTANEOUS | Status: DC
  Administered 2013-08-23: 40 mg via SUBCUTANEOUS
  Filled 2013-08-23: qty 0.4

## 2013-08-23 MED ORDER — STUDY - INVESTIGATIONAL DRUG SIMPLE RECORD
600.0000 mg | Status: AC
  Administered 2013-08-23: 600 mg via ORAL
  Filled 2013-08-23: qty 600

## 2013-08-23 MED ORDER — ASPIRIN 81 MG PO CHEW
CHEWABLE_TABLET | ORAL | Status: AC
  Filled 2013-08-23: qty 1

## 2013-08-23 MED ORDER — SENNOSIDES-DOCUSATE SODIUM 8.6-50 MG PO TABS
1.0000 | ORAL_TABLET | Freq: Every evening | ORAL | Status: DC | PRN
  Filled 2013-08-23: qty 1

## 2013-08-23 MED ORDER — STROKE: EARLY STAGES OF RECOVERY BOOK
Freq: Once | Status: DC
  Filled 2013-08-23: qty 1

## 2013-08-23 NOTE — ED Notes (Signed)
Pt now can move arm.  Speech improved, though slurred to some extent.

## 2013-08-23 NOTE — ED Notes (Signed)
Admitting MD at bedside.

## 2013-08-23 NOTE — ED Notes (Signed)
When attempting 1400 neuro checks pt could not raise L arm and speech became slurred.  EDP notified and  adimitting MD and neurology paged.

## 2013-08-23 NOTE — Progress Notes (Signed)
Unit CM UR Completed by [facility] ED CM  W. Edithe Dobbin RN  

## 2013-08-23 NOTE — H&P (Signed)
[facility] History and Physical  DEKISHA MESMER YJE:[id number] DOB: 03/24/1950 DOA: 08/23/2013  Referring physician:  PCP: Elyn Peers, MD  Specialists:   Chief Complaint: Stroke  HPI: [name] is a 63 y.o. female  With a history of hypertension that presented to the emergency department with complaints of left-sided weakness. Patient stated actually after she returned home from work around 11 AM this morning, she began feeling weakness and numbness in her left upper extremity. Patient states she had some the left lower extremity which had resolved prior to arrival. EMS was called and patient was noted to have decreased strength in the left upper and lower extremity. She did not have any facial drooping. Patient did have some speech changes. She called her daughter which point her daughter noted that her speech was different.  Currently her daughters are at bedside, and the patient's speech has improved. Patient feels that her strength has also improved and is no longer feeling the numbness in her left upper extremity. She does have some tingling in her hands particularly her fingertips. She denies any recent falls, trauma, travel, contact with ill persons. Patient denies any chest pain, shortness of breath, dizziness, headache, abdominal pain, dysuria at this time.   In the emergency department, CT of the head was done which was negative for stroke. Neurology was consulted. Patient was in the window for TPA however since her symptoms were improving, TPA was not given.  Review of Systems:  Constitutional: Denies fever, chills, diaphoresis, appetite change and fatigue.  HEENT: Denies photophobia, eye pain, redness, hearing loss, ear pain, congestion, sore throat, rhinorrhea, sneezing, mouth sores, trouble swallowing, neck pain, neck stiffness and tinnitus.   Respiratory: Denies SOB, DOE, cough, chest tightness,  and wheezing.   Cardiovascular: Denies chest pain, palpitations and leg  swelling.  Gastrointestinal: Denies nausea, vomiting, abdominal pain, diarrhea, constipation, blood in stool and abdominal distention.  Genitourinary: Denies dysuria, urgency, frequency, hematuria, flank pain and difficulty urinating.  Musculoskeletal: Denies myalgias, back pain, joint swelling, arthralgias and gait problem.  Skin: Denies pallor, rash and wound.  Neurological: Left sided weakness, tingling in hand, slurred speech.  Hematological: Denies adenopathy. Easy bruising, personal or family bleeding history  Psychiatric/Behavioral: Denies suicidal ideation, mood changes, confusion, nervousness, sleep disturbance and agitation  Past Medical History  Diagnosis Date  . Hypertension   . Arthritis    Past Surgical History  Procedure Laterality Date  . Joint replacement    . Replacement total knee Left    Social History:  reports that she has never smoked. She does not have any smokeless tobacco history on file. She reports that she does not drink alcohol or use illicit drugs.  No Known Allergies  No family history on file.  Prior to Admission medications   Medication Sig Start Date End Date Taking? Authorizing Provider  losartan-hydrochlorothiazide (HYZAAR) 50-12.5 MG per tablet Take 1 tablet by mouth daily.   Yes Historical Provider, MD   Physical Exam: Filed Vitals:   08/23/13 1319  BP: 109/59  Pulse: 82  Temp:   Resp: 20     General: Well developed, well nourished, NAD, appears stated age  HEENT: NCAT, PERRLA, EOMI, Anicteic Sclera, mucous membranes moist.   Neck: Supple, no JVD, no masses  Cardiovascular: S1 S2 auscultated, no rubs, murmurs or gallops. Regular rate and rhythm.  Respiratory: Clear to auscultation bilaterally with equal chest rise  Abdomen: Soft, nontender, nondistended, + bowel sounds  Extremities: warm dry without cyanosis clubbing or  edema  Neuro: AAOx3, cranial nerves grossly intact. Strength 5/5 in patient's upper and lower extremities  bilaterally. Some slurred speech.    Skin: Without rashes exudates or nodules  Psych: Anxious, however appropriate affect and demeanor with intact judgement and insight  Labs on Admission:  Basic Metabolic Panel:  Recent Labs Lab 08/23/13 1250 08/23/13 1257  NA 143 142  K 3.7 3.6*  CL 104 105  CO2 25  --   GLUCOSE 118* 120*  BUN 10 9  CREATININE 0.64 0.70  CALCIUM 8.6  --    Liver Function Tests:  Recent Labs Lab 08/23/13 1250  AST 14  ALT 21  ALKPHOS 63  BILITOT 0.4  PROT 7.2  ALBUMIN 3.4*   No results found for this basename: LIPASE, AMYLASE,  in the last 168 hours No results found for this basename: AMMONIA,  in the last 168 hours CBC:  Recent Labs Lab 08/23/13 1250 08/23/13 1257  WBC 4.8  --   NEUTROABS 1.4*  --   HGB 11.4* 12.6  HCT 35.3* 37.0  MCV 87.8  --   PLT 213  --    Cardiac Enzymes: No results found for this basename: CKTOTAL, CKMB, CKMBINDEX, TROPONINI,  in the last 168 hours  BNP (last 3 results)  Recent Labs  04/21/13 1940  PROBNP <5.0   CBG:  Recent Labs Lab 08/23/13 1240  GLUCAP 123*    Radiological Exams on Admission: Ct Head Wo Contrast  08/23/2013   CLINICAL DATA:  Acute stroke. Left-sided paralysis in weakness. Hypertension.  EXAM: CT HEAD WITHOUT CONTRAST  TECHNIQUE: Contiguous axial images were obtained from the base of the skull through the vertex without intravenous contrast.  COMPARISON:  06/03/2007  FINDINGS: There is no evidence of intracranial hemorrhage, brain edema, or other signs of acute infarction.  Arachnoid cyst is again seen in the right middle cranial fossa with mass effect on the right temporal horn, which is stable. No evidence of midline shift. No other abnormal extra-axial fluid collections are seen. Ventricles are normal in size. No skull abnormality identified.  IMPRESSION: No acute intracranial abnormality.  Stable arachnoid cyst in right middle cranial fossa.  These results will be called to the  ordering clinician or representative by the Radiologist Assistant, and communication documented in the PACS or zVision Dashboard.   Electronically Signed   By: Earle Gell M.D.   On: 08/23/2013 13:16    EKG: Independently reviewed. Sinus rhythm, rate 79, first degree AV block, PR 214  Assessment/Plan  Left-sided weakness/possible acute CVA -Will patient to telemetry to monitor for any tachycardia or bradycardia arrhythmias -Will conduct neuro checks every 2 hours -Will obtain MRI/MRA of the head, echocardiogram, carotid Doppler, hemoglobin A1c, lipid panel -Will consult PT, OT, speech therapy -Patient symptoms are currently improving, therefore she was not given TPA -NIH scale 2 -CT head showed no acute intracranial abnormality -Neurology consulted and appreciated  Essential Hypertension -Currently normotensive, will losartan/HCTZ -Will allow for permissive hypertension  History of borderline diabetes -Patient has not taken any medications for diabetes, never had the actual diagnosis. -Will obtain hemoglobin A1c  Obesity, BMI 43.5 -Patient we did discuss diet modifications and exercise program with her primary care physician at discharge  Normocytic anemia -Hemoglobin 11.4 -Baseline appears to be approximately 12, continue to monitor CBC  DVT prophylaxis: Lovenox  Code Status: Full  Condition: Guarded  Family Communication: Family at bedside. Admission, patients condition and plan of care including tests being ordered have been discussed with  the patient and family who indicate understanding and agree with the plan and Code Status.  Disposition Plan: Admitted  Time spent: 60 minutes  Debbe Crumble D.O. [facility] Pager [phone]  If 7PM-7AM, please contact night-coverage [email or web address] Password [email or web address] 08/23/2013, 1:40 PM

## 2013-08-23 NOTE — ED Notes (Signed)
Per EMS- ptar was on scene with pt for "sick" call. Pt began having left sided weakness in arm and leg as well as slurred speech. CBG 143

## 2013-08-23 NOTE — ED Provider Notes (Signed)
CSN: [id number]     Arrival date & time 08/23/13  1239 History   First MD Initiated Contact with Patient 08/23/13 1241     No chief complaint on file.    (Consider location/radiation/quality/duration/timing/severity/associated sxs/prior Treatment) The history is provided by the patient.   Patient here with acute onset of left upper left lower extremity weakness which began just prior to arrival. EMS called and found patient to have decreased strength in her left upper and left lower extremity. No facial drooping noted. Questionable speech changes. Patient denies any headache or trauma. Symptoms persisted and nothing makes them better or worse. No treatment used prior to arrival. Past Medical History  Diagnosis Date  . Hypertension   . Arthritis    Past Surgical History  Procedure Laterality Date  . Joint replacement    . Replacement total knee Left    No family history on file. History  Substance Use Topics  . Smoking status: Never Smoker   . Smokeless tobacco: Not on file  . Alcohol Use: No   OB History   Grav Para Term Preterm Abortions TAB SAB Ect Mult Living                 Review of Systems  All other systems reviewed and are negative.     Allergies  Review of patient's allergies indicates no known allergies.  Home Medications   Prior to Admission medications   Medication Sig Start Date End Date Taking? Authorizing Provider  cyclobenzaprine (FLEXERIL) 10 MG tablet Take 0.5-1 tablets (5-10 mg total) by mouth 2 (two) times daily as needed for muscle spasms. 05/12/13   Linus Mako, PA-C  HYDROcodone-acetaminophen (NORCO/VICODIN) 5-325 MG per tablet Take 1-2 tablets every 6 hours as needed for severe pain 07/27/13   Carlisle Cater, PA-C  losartan-hydrochlorothiazide (HYZAAR) 50-12.5 MG per tablet Take 1 tablet by mouth daily.    Historical Provider, MD  methocarbamol (ROBAXIN) 500 MG tablet Take 2 tablets (1,000 mg total) by mouth 4 (four) times daily. 07/27/13    Carlisle Cater, PA-C  naproxen (NAPROSYN) 500 MG tablet Take 1 tablet (500 mg total) by mouth 2 (two) times daily. 07/27/13   Carlisle Cater, PA-C  naproxen sodium (ANAPROX) 220 MG tablet Take 440 mg by mouth daily as needed (headache).    Historical Provider, MD   There were no vitals taken for this visit. Physical Exam  Nursing note and vitals reviewed. Constitutional: She is oriented to person, place, and time. She appears well-developed and well-nourished.  Non-toxic appearance. No distress.  HENT:  Head: Normocephalic and atraumatic.  Eyes: Conjunctivae, EOM and lids are normal. Pupils are equal, round, and reactive to light.  Neck: Normal range of motion. Neck supple. No tracheal deviation present. No mass present.  Cardiovascular: Normal rate, regular rhythm and normal heart sounds.  Exam reveals no gallop.   No murmur heard. Pulmonary/Chest: Effort normal and breath sounds normal. No stridor. No respiratory distress. She has no decreased breath sounds. She has no wheezes. She has no rhonchi. She has no rales.  Abdominal: Soft. Normal appearance and bowel sounds are normal. She exhibits no distension. There is no tenderness. There is no rebound and no CVA tenderness.  Musculoskeletal: Normal range of motion. She exhibits no edema and no tenderness.  Neurological: She is alert and oriented to person, place, and time. No cranial nerve deficit or sensory deficit. GCS eye subscore is 4. GCS verbal subscore is 5. GCS motor subscore is 6.  Left  upper extremity strength is 3/5, left lower extremity strength is 4/5. No facial droop noted. Speech appears normal. Left-sided dysmetria noted Gait not tested. No right-sided deficits  Skin: Skin is warm and dry. No abrasion and no rash noted.  Psychiatric: She has a normal mood and affect. Her speech is normal and behavior is normal.    ED Course  Procedures (including critical care time) Labs Review Labs Reviewed  ETHANOL  PROTIME-INR  APTT   CBC  DIFFERENTIAL  COMPREHENSIVE METABOLIC PANEL  URINE RAPID DRUG SCREEN (HOSP PERFORMED)  URINALYSIS, ROUTINE W REFLEX MICROSCOPIC  I-STAT CHEM 8, ED  I-STAT TROPOININ, ED  I-STAT TROPOININ, ED    Imaging Review No results found.   EKG Interpretation   Date/Time:  Saturday August 23 2013 12:53:10 EDT Ventricular Rate:  79 PR Interval:  214 QRS Duration: 84 QT Interval:  383 QTC Calculation: 439 R Axis:   62 Text Interpretation:  Sinus rhythm Borderline prolonged PR interval  Probable left atrial enlargement Probable left ventricular hypertrophy  Nonspecific T abnormalities, inferior leads No significant change since  last tracing Confirmed by Zenia Resides  MD, Jaymeson Mengel ([id number]) on 08/23/2013 1:13:21  PM      MDM   Patient has been seen by the neurologist and because the patient's symptoms are improving the patient would not be a TPA candidate and will be admitted to the medicine service    Leota Jacobsen, MD 08/23/13 1314

## 2013-08-23 NOTE — ED Notes (Signed)
Dr Armida Sans responded and stated NIH back to previous state.  OK for pt to be placed on stepdown and continue to MRI.

## 2013-08-23 NOTE — Consult Note (Addendum)
Referring Physician: ED    Chief Complaint: CODE STROKE: LEFT HEMIPARESIS, DYSARTHRIA, LEFT SIDED NUMBNESS  HPI:                                                                                                                                         [name] is an 63 y.o. female, right handed, with a past medical history significant for HTN, arthritis, brought in via EMS as a code stroke due to acute onset of the above stated symptoms. She was home when suddenly notice inability to move the left side and speech difficulty. Never had similar symptoms before. EMS was summoned and they report that patient was weak mainly in the left arm and had some speech changes. No associated HA, vertigo, double vision, difficulty swallowing, confusion, or visual disturbances. No chest pain, SOB, or palpitations. Initial NIHSS 3, but subsequently NIHSS 2  CT brain showed no acute abnormality. Currently, she is alert, answering questions appropriately, but said that " my speech sounds stupid".  Date last known well: 08/23/13  Time last known well: 12:15 pm tPA Given: no, mild deficits and rapidly improving. NIHSS: 3 (sensory, speech, left leg drift)   Past Medical History  Diagnosis Date  . Hypertension   . Arthritis     Past Surgical History  Procedure Laterality Date  . Joint replacement    . Replacement total knee Left     No family history on file. Social History:  reports that she has never smoked. She does not have any smokeless tobacco history on file. She reports that she does not drink alcohol or use illicit drugs.  Allergies: No Known Allergies  Medications:                                                                                                                           I have reviewed the patient's current medications.  ROS:  History obtained  from the patient and chart review.  General ROS: negative for - chills, fatigue, fever, night sweats, or weight loss Psychological ROS: negative for - behavioral disorder, hallucinations, memory difficulties, mood swings or suicidal ideation Ophthalmic ROS: negative for - blurry vision, double vision, eye pain or loss of vision ENT ROS: negative for - epistaxis, nasal discharge, oral lesions, sore throat, tinnitus or vertigo Allergy and Immunology ROS: negative for - hives or itchy/watery eyes Hematological and Lymphatic ROS: negative for - bleeding problems, bruising or swollen lymph nodes Endocrine ROS: negative for - galactorrhea, hair pattern changes, polydipsia/polyuria or temperature intolerance Respiratory ROS: negative for - cough, hemoptysis, shortness of breath or wheezing Cardiovascular ROS: negative for - chest pain, dyspnea on exertion, edema or irregular heartbeat Gastrointestinal ROS: negative for - abdominal pain, diarrhea, hematemesis, nausea/vomiting or stool incontinence Genito-Urinary ROS: negative for - dysuria, hematuria, incontinence or urinary frequency/urgency Musculoskeletal ROS: negative for - joint swelling Neurological ROS: as noted in HPI Dermatological ROS: negative for rash and skin lesion changes  Physical exam: pleasant female in no apparent distress. Blood pressure 138/79, pulse 80, temperature 97.9 F (36.6 C), temperature source Oral, resp. rate 16, height 5\' 3"  (1.6 m), weight 111.131 kg (245 lb), SpO2 98.00%. Head: normocephalic. Neck: supple, no bruits, no JVD. Cardiac: no murmurs. Lungs: clear. Abdomen: soft, no tender, no mass. Extremities: no edema. Neurologic Examination:                                                                                                      General: Mental Status: Alert, oriented, thought content appropriate.  Speech fluent without evidence of aphasia.  Able to follow 3 step commands without difficulty. Cranial  Nerves: II: Discs flat bilaterally; Visual fields grossly normal, pupils equal, round, reactive to light and accommodation III,IV, VI: ptosis not present, extra-ocular motions intact bilaterally V,VII: smile symmetric, facial light touch sensation normal bilaterally VIII: hearing normal bilaterally IX,X: gag reflex present XI: bilateral shoulder shrug XII: midline tongue extension without atrophy or fasciculations  Motor: Right : Upper extremity   5/5    Left:     Upper extremity   5/5  Lower extremity   5/5     Lower extremity   5/5 Tone and bulk:normal tone throughout; no atrophy noted Sensory: Pinprick and light touch intact throughout, bilaterally Deep Tendon Reflexes:  1+ all over  Plantars: Right: downgoing   Left: downgoing  Cerebellar: normal finger-to-nose,  normal heel-to-shin test Gait: no tested CV: pulses palpable throughout    Results for orders placed during the hospital encounter of 08/23/13 (from the past 48 hour(s))  CBG MONITORING, ED     Status: Abnormal   Collection Time    08/23/13 12:40 PM      Result Value Ref Range   Glucose-Capillary 123 (*) 70 - 99 mg/dL  CBC     Status: Abnormal   Collection Time    08/23/13 12:50 PM      Result Value Ref Range   WBC 4.8  4.0 - 10.5 K/uL   RBC 4.02  3.87 - 5.11 MIL/uL   Hemoglobin 11.4 (*) 12.0 - 15.0 g/dL   HCT 35.3 (*) 36.0 - 46.0 %   MCV 87.8  78.0 - 100.0 fL   MCH 28.4  26.0 - 34.0 pg   MCHC 32.3  30.0 - 36.0 g/dL   RDW 14.4  11.5 - 15.5 %   Platelets 213  150 - 400 K/uL  DIFFERENTIAL     Status: Abnormal   Collection Time    08/23/13 12:50 PM      Result Value Ref Range   Neutrophils Relative % 30 (*) 43 - 77 %   Neutro Abs 1.4 (*) 1.7 - 7.7 K/uL   Lymphocytes Relative 53 (*) 12 - 46 %   Lymphs Abs 2.6  0.7 - 4.0 K/uL   Monocytes Relative 10  3 - 12 %   Monocytes Absolute 0.5  0.1 - 1.0 K/uL   Eosinophils Relative 6 (*) 0 - 5 %   Eosinophils Absolute 0.3  0.0 - 0.7 K/uL   Basophils Relative 1  0  - 1 %   Basophils Absolute 0.0  0.0 - 0.1 K/uL  I-STAT CHEM 8, ED     Status: Abnormal   Collection Time    08/23/13 12:57 PM      Result Value Ref Range   Sodium 142  137 - 147 mEq/L   Potassium 3.6 (*) 3.7 - 5.3 mEq/L   Chloride 105  96 - 112 mEq/L   BUN 9  6 - 23 mg/dL   Creatinine, Ser 0.70  0.50 - 1.10 mg/dL   Glucose, Bld 120 (*) 70 - 99 mg/dL   Calcium, Ion 1.14  1.13 - 1.30 mmol/L   TCO2 23  0 - 100 mmol/L   Hemoglobin 12.6  12.0 - 15.0 g/dL   HCT 37.0  36.0 - 46.0 %   No results found.   Assessment: 63 y.o. female with possible right brain stroke versus TIA. Rapidly improving in the ED and mild deficits, thus thrombolysis was not administered. Admit to medicine. Complete stroke work up. Start aspirin as soon as she passes swallowing evaluation. Stroke team will follow up in am  Stroke Risk Factors - HTN  Plan: 1. HgbA1c, fasting lipid panel 2. MRI, MRA  of the brain without contrast 3. Echocardiogram 4. Carotid dopplers 5. Prophylactic therapy-aspirin 6. Risk factor modification 7. Telemetry monitoring 8. Frequent neuro checks 9. PT/OT SLP   Dorian Pod, MD [facility] [phone]  08/23/2013, 1:03 PM  Addendum: I got a call from ER nurse stating that at 1400 neuro checks patient was having increasing weakness of the left arm but then minutes later she was able to move the left arm again and NIHSS was back to 3 and I ok patient to go to MRI. MRI brain confirmed an acute infarction within the right posterior limb internal capsule/ corona radiata. Patient and family updated.  Dorian Pod, MD

## 2013-08-24 ENCOUNTER — Inpatient Hospital Stay (HOSPITAL_COMMUNITY): Payer: Medicare Other

## 2013-08-24 DIAGNOSIS — I509 Heart failure, unspecified: Secondary | ICD-10-CM

## 2013-08-24 DIAGNOSIS — I517 Cardiomegaly: Secondary | ICD-10-CM

## 2013-08-24 DIAGNOSIS — I5031 Acute diastolic (congestive) heart failure: Secondary | ICD-10-CM

## 2013-08-24 DIAGNOSIS — E785 Hyperlipidemia, unspecified: Secondary | ICD-10-CM

## 2013-08-24 LAB — URINALYSIS, ROUTINE W REFLEX MICROSCOPIC
Bilirubin Urine: NEGATIVE
Glucose, UA: NEGATIVE mg/dL
HGB URINE DIPSTICK: NEGATIVE
KETONES UR: NEGATIVE mg/dL
Leukocytes, UA: NEGATIVE
Nitrite: NEGATIVE
PROTEIN: NEGATIVE mg/dL
Specific Gravity, Urine: 1.016 (ref 1.005–1.030)
Urobilinogen, UA: 0.2 mg/dL (ref 0.0–1.0)
pH: 6.5 (ref 5.0–8.0)

## 2013-08-24 LAB — RAPID URINE DRUG SCREEN, HOSP PERFORMED
AMPHETAMINES: NOT DETECTED
BENZODIAZEPINES: NOT DETECTED
Barbiturates: NOT DETECTED
Cocaine: NOT DETECTED
OPIATES: NOT DETECTED
Tetrahydrocannabinol: NOT DETECTED

## 2013-08-24 LAB — LIPID PANEL
CHOL/HDL RATIO: 4.9 ratio
Cholesterol: 168 mg/dL (ref 0–200)
HDL: 34 mg/dL — ABNORMAL LOW (ref >39)
LDL Cholesterol: 106 mg/dL — ABNORMAL HIGH (ref 0–99)
Triglycerides: 139 mg/dL (ref <150)
VLDL: 28 mg/dL (ref 0–40)

## 2013-08-24 LAB — HEMOGLOBIN A1C
Hgb A1c MFr Bld: 6.1 % — ABNORMAL HIGH (ref <5.7)
Mean Plasma Glucose: 128 mg/dL — ABNORMAL HIGH (ref <117)

## 2013-08-24 MED ORDER — ATORVASTATIN CALCIUM 10 MG PO TABS: 10.0000 mg | ORAL_TABLET | Freq: Every day | ORAL | Status: DC

## 2013-08-24 MED ORDER — METOPROLOL TARTRATE 12.5 MG HALF TABLET
12.5000 mg | ORAL_TABLET | Freq: Two times a day (BID) | ORAL | Status: DC
  Administered 2013-08-24 – 2013-08-26 (×4): 12.5 mg via ORAL
  Filled 2013-08-24 (×5): qty 1

## 2013-08-24 MED ORDER — SIMVASTATIN 20 MG PO TABS
20.0000 mg | ORAL_TABLET | Freq: Every day | ORAL | Status: DC
  Filled 2013-08-24: qty 1

## 2013-08-24 MED ORDER — ATORVASTATIN CALCIUM 40 MG PO TABS
40.0000 mg | ORAL_TABLET | Freq: Every day | ORAL | Status: DC
  Administered 2013-08-24 – 2013-08-26 (×3): 40 mg via ORAL
  Filled 2013-08-24 (×3): qty 1

## 2013-08-24 NOTE — Plan of Care (Signed)
Problem: Consults Goal: RH STROKE PATIENT EDUCATION See Patient Education module for education specifics  Outcome: Progressing Education information given

## 2013-08-24 NOTE — Progress Notes (Signed)
*  PRELIMINARY RESULTS* Vascular Ultrasound Carotid Duplex (Doppler) has been completed.  Findings suggest 1-39% internal carotid artery stenosis bilaterally. Vertebral arteries are patent with antegrade flow.  08/24/2013 1:52 PM Maudry Mayhew, RVT, RDCS, RDMS

## 2013-08-24 NOTE — Evaluation (Signed)
Physical Therapy Evaluation Patient Details Name: [name] MRN: [id number] DOB: [birth year] Today's Date: 08/24/2013   History of Present Illness  Patient is a 63 yo female admitted 08/23/13 with Lt-sided weakness and dysarthria.  MRI showed Acute infarction within the right posterior limb internal capsule/corona radiata.  Baseline NIHSS = 3, and NIHSS am of 7/19 was 7.   PMH: HTN, Lt TKA.  Clinical Impression  Patient presents with problems listed below.  Will benefit from acute PT to maximize independence prior to discharge.  Patient was independent prior to admission.  Recommend Inpatient Rehab consult to return patient to optimal functional level.    Follow Up Recommendations CIR    Equipment Recommendations  Rolling walker with 5" wheels;Wheelchair (measurements PT);Wheelchair cushion (measurements PT)    Recommendations for Other Services Rehab consult     Precautions / Restrictions Precautions Precautions: Fall Precaution Comments: Lt hemiparesis Restrictions Weight Bearing Restrictions: No      Mobility  Bed Mobility Overal bed mobility: Needs Assistance Bed Mobility: Rolling;Sidelying to Sit;Sit to Supine Rolling: Mod assist;Min assist (Min to left; Mod to right) Sidelying to sit: Mod assist   Sit to supine: Mod assist   General bed mobility comments: Verbal cues for technique.  Able to roll to left using RUE and bed rail.  Assist to bring LUE forward and hips forward to roll to right side.  Mod assist to raise trunk to sitting.  Mod assist to scoot to EOB.  Patient able to maintain sitting balance with use of RUE on rail with min guard assist.  Lt shoulder rotated posteriorly in sitting.  Worked on sitting balance and midline posture in sitting.  Returned to supine with mod assist.  Transfers                    Ambulation/Gait                Stairs            Wheelchair Mobility    Modified Rankin (Stroke Patients Only) Modified Rankin  (Stroke Patients Only) Pre-Morbid Rankin Score: No symptoms Modified Rankin: Severe disability     Balance Overall balance assessment: Needs assistance Sitting-balance support: Single extremity supported;Feet supported Sitting balance-Leahy Scale: Poor Sitting balance - Comments: Patient with Lt side rotated posteriorly.  Cues to bring shoulder and UE forward in sitting.                                       Pertinent Vitals/Pain     Home Living Family/patient expects to be discharged to:: Private residence Living Arrangements: Alone Available Help at Discharge: Family;Available PRN/intermittently (Family works) Type of Home: House Home Access: Stairs to enter Entrance Stairs-Rails: None Technical brewer of Steps: 2 Home Layout: One level Home Equipment: None      Prior Function Level of Independence: Independent               Hand Dominance   Dominant Hand: Right    Extremity/Trunk Assessment   Upper Extremity Assessment: LUE deficits/detail;Defer to OT evaluation           Lower Extremity Assessment: LLE deficits/detail   LLE Deficits / Details: Decreased strength - grossly 3-/5 hip flex and knee ext, and 1/5 DF/PF  Cervical / Trunk Assessment: Normal  Communication   Communication: Expressive difficulties (Speech slightly slurred)  Cognition Arousal/Alertness: Awake/alert Behavior During Therapy:  WFL for tasks assessed/performed Overall Cognitive Status: Within Functional Limits for tasks assessed                      General Comments      Exercises        Assessment/Plan    PT Assessment Patient needs continued PT services  PT Diagnosis Difficulty walking;Hemiplegia non-dominant side   PT Problem List Decreased strength;Decreased activity tolerance;Decreased balance;Decreased mobility;Decreased knowledge of use of DME;Impaired tone  PT Treatment Interventions DME instruction;Gait training;Functional mobility  training;Therapeutic activities;Balance training;Neuromuscular re-education;Patient/family education   PT Goals (Current goals can be found in the Care Plan section) Acute Rehab PT Goals Patient Stated Goal: To get better PT Goal Formulation: With patient Time For Goal Achievement: 09/07/13 Potential to Achieve Goals: Good    Frequency Min 4X/week   Barriers to discharge Decreased caregiver support Patient lives alone    Co-evaluation               End of Session   Activity Tolerance: Patient limited by fatigue Patient left: in bed;with call bell/phone within reach;with nursing/sitter in room Nurse Communication: Mobility status         Time: 1759-1820 PT Time Calculation (min): 21 min   Charges:   PT Evaluation $Initial PT Evaluation Tier I: 1 Procedure PT Treatments $Therapeutic Activity: 8-22 mins   PT G Codes:          Despina Pole 08/24/2013, 6:50 PM Carita Pian. Sanjuana Kava, [place] Pager [phone]

## 2013-08-24 NOTE — Progress Notes (Signed)
[facility] TEAM 1 - Stepdown/ICU TEAM Progress Note  [name] WVP:[id number] DOB: 1950/07/16 DOA: 08/23/2013 PCP: Elyn Peers, MD  Admit HPI / Brief Narrative: [name] is a 63 y.o.BF PMHx   With a history of hypertension that presented to the emergency department with complaints of left-sided weakness. Patient stated actually after she returned home from work around 11 AM this morning, she began feeling weakness and numbness in her left upper extremity. Patient states she had some the left lower extremity which had resolved prior to arrival. EMS was called and patient was noted to have decreased strength in the left upper and lower extremity. She did not have any facial drooping. Patient did have some speech changes. She called her daughter which point her daughter noted that her speech was different. Currently her daughters are at bedside, and the patient's speech has improved. Patient feels that her strength has also improved and is no longer feeling the numbness in her left upper extremity. She does have some tingling in her hands particularly her fingertips. She denies any recent falls, trauma, travel, contact with ill persons. Patient denies any chest pain, shortness of breath, dizziness, headache, abdominal pain, dysuria at this time.  In the emergency department, CT of the head was done which was negative for stroke. Neurology was consulted. Patient was in the window for TPA however since her symptoms were improving, TPA was not given.   HPI/Subjective: 7/19 A./O. x4, negative CP, negative SOB. Positive anxiety concerning her current disability.  Assessment/Plan: Acute CVA -PT/OT to evaluate for CIR  vs SNF vs outpatient  vs home health -Speech to evaluate for bedside swallow -Patient is in a study will follow neurology recommendation  Essential HTN -Metoprolol 12.5 mg BID -If patient's BP will stand will also start ACE inhibitor or ARB.  Diastolic CHF -See a essential  HTN\  Borderline diabetes -7/19 hemoglobin A1c= 6.1  HLD -Start Lipitor 40 mg daily  Normocytic anemia -Stable      Code Status: FULL Family Communication: family present at time of exam Disposition Plan: Per neurology   Consultants: Dr. Jim Like (neurology)   Procedure/Significant Events: 7/18 CT head without contrast No acute abnormality. Stable arachnoid cyst Rt middle cranial fossa. 7/18 MRA head without contrast; Acute infarction w/i Rt posterior limb internal capsule/ corona radiata. -Moderate chronic small vessel ischemic changes  - Moderate diffuse distal vessel atherosclerotic irregularity. 7/19 CT head without contrast ;Right basal ganglia to right corona radiata infarct has evolved on  CT, with its extent similar 12 was noted on the prior MRI.  -No evidence of a new infarct. No intracranial hemorrhage.   7/19 echocardiogram - Left ventricle:  mild to moderate LVH. -LVEF= 60% to 65%.  -(grade 1 diastolic dysfunction).  7/19 carotid Doppler:1-39% internal carotid artery stenosis bilaterally. Vertebral arteries patent with antegrade flow.     Culture NA  Antibiotics: NA  DVT prophylaxis: aspirin 81 mg orally every day and Point trial medication for secondary stroke prevention   Devices NA   LINES / TUBES:  7/18 18ga left antecubital    Continuous Infusions:   Objective: VITAL SIGNS: Temp: 98.2 F (36.8 C) (07/19 1143) Temp src: Oral (07/19 1143) BP: 98/77 mmHg (07/19 1143) Pulse Rate: 68 (07/19 1143) SPO2; FIO2:   Intake/Output Summary (Last 24 hours) at 08/24/13 1414 Last data filed at 08/24/13 1213  Gross per 24 hour  Intake    555 ml  Output   1100 ml  Net   -  545 ml     Exam: General: /O. x4, NAD, No acute respiratory distress Lungs: Clear to auscultation bilaterally without wheezes or crackles Cardiovascular: Regular rate and rhythm without murmur gallop or rub normal S1 and S2 Abdomen: Nontender, nondistended, soft,  bowel sounds positive, no rebound, no ascites, no appreciable mass Extremities: No significant cyanosis, clubbing, or edema bilateral lower extremities Neurologic; cranial nerves II through XII intact, pupils equal reactive to light and accommodation, tongue/uvula midline, right upper extremity/lower extremity strength 5/5, left upper extremity strength 4/5, left lower extremity strength 2/5, right upper extremity finger nose finger within normal limit, left upper extremity on able to perform tasks, right upper extremity quick finger touches within normal limit, left upper extremity quick finger touches patient able to perform it at a significantly degraded level, sensation decreased on the left lower extremity. Did not ambulate patient  Data Reviewed: Basic Metabolic Panel:  Recent Labs Lab 08/23/13 1250 08/23/13 1257  NA 143 142  K 3.7 3.6*  CL 104 105  CO2 25  --   GLUCOSE 118* 120*  BUN 10 9  CREATININE 0.64 0.70  CALCIUM 8.6  --    Liver Function Tests:  Recent Labs Lab 08/23/13 1250  AST 14  ALT 21  ALKPHOS 63  BILITOT 0.4  PROT 7.2  ALBUMIN 3.4*   No results found for this basename: LIPASE, AMYLASE,  in the last 168 hours No results found for this basename: AMMONIA,  in the last 168 hours CBC:  Recent Labs Lab 08/23/13 1250 08/23/13 1257  WBC 4.8  --   NEUTROABS 1.4*  --   HGB 11.4* 12.6  HCT 35.3* 37.0  MCV 87.8  --   PLT 213  --    Cardiac Enzymes: No results found for this basename: CKTOTAL, CKMB, CKMBINDEX, TROPONINI,  in the last 168 hours BNP (last 3 results)  Recent Labs  04/21/13 1940  PROBNP <5.0   CBG:  Recent Labs Lab 08/23/13 1240  [place] 123*    Recent Results (from the past 240 hour(s))  MRSA PCR SCREENING     Status: None   Collection Time    08/23/13  5:48 PM      Result Value Ref Range Status   MRSA by PCR NEGATIVE  NEGATIVE Final   Comment:            The GeneXpert MRSA Assay (FDA     approved for NASAL specimens      only), is one component of a     comprehensive MRSA colonization     surveillance program. It is not     intended to diagnose MRSA     infection nor to guide or     monitor treatment for     MRSA infections.     Studies:  Recent x-ray studies have been reviewed in detail by the Attending Physician  Scheduled Meds:  Scheduled Meds: .  stroke: mapping our early stages of recovery book   Does not apply Once  . aspirin EC  81 mg Oral Daily  . research study medication  75 mg Oral Q breakfast  . simvastatin  20 mg Oral q1800    Time spent on care of this patient: 40 mins   [name] , MD   [facility] Office  [phone] Pager - [phone]  On-Call/Text Page:      [email or web address]      password [email or web address]  If 7PM-7AM, please contact night-coverage [email or web address] Password [facility] 08/24/2013, 2:14  PM   LOS: 1 day

## 2013-08-24 NOTE — Progress Notes (Signed)
Stroke Team Progress Note  HISTORY ALLESANDRA HUEBSCH is a 63 y.o. female, right handed, with a past medical history significant for HTN, arthritis, brought in via EMS as a code stroke due to acute onset of left hemiparesis, dysarthria, and left-sided numbness. She was home when she suddenly notice inability to move the left side and speech difficulty. Never had similar symptoms before. EMS was summoned and they reported that patient was weak mainly in the left arm and had some speech changes.  No associated HA, vertigo, double vision, difficulty swallowing, confusion, or visual disturbances. No chest pain, SOB, or palpitations.  Initial NIHSS 3, but subsequently NIHSS 2  CT brain showed no acute abnormality.  Currently, she is alert, answering questions appropriately, but said that " my speech sounds stupid".  Patient was enrolled in the [place] trial.  Date last known well: 08/23/13  Time last known well: 12:15 pm  tPA Given: no, mild deficits and rapidly improving.  NIHSS: 3 (sensory, speech, left leg drift)    SUBJECTIVE No family members present. Pt states her left side is weaker this AM. Nurse had called me to report NIH of 7 this am. Last night NIH was 3. Not sure when change occurred.   OBJECTIVE Most recent Vital Signs: Filed Vitals:   08/23/13 2024 08/24/13 0006 08/24/13 0433 08/24/13 0740  BP: 153/64 105/47 123/76 136/84  Pulse: 79 80 73 76  Temp: 97.3 F (36.3 C) 97.6 F (36.4 C) 98.3 F (36.8 C) 97.9 F (36.6 C)  TempSrc: Oral Oral Oral Oral  Resp: 17 13 13 11   Height:      Weight:      SpO2: 98% 92% 95% 94%   CBG (last 3)   Recent Labs  08/23/13 1240  GLUCAP 123*    IV Fluid Intake:     MEDICATIONS  .  stroke: mapping our early stages of recovery book   Does not apply Once  . aspirin EC  81 mg Oral Daily  . research study medication  75 mg Oral Q breakfast   PRN:  senna-docusate  Diet:  Cardiac with thin  liquids Activity:  Bedrest DVT Prophylaxis:   SCDs  CLINICALLY SIGNIFICANT STUDIES Basic Metabolic Panel:  Recent Labs Lab 08/23/13 1250 08/23/13 1257  NA 143 142  K 3.7 3.6*  CL 104 105  CO2 25  --   GLUCOSE 118* 120*  BUN 10 9  CREATININE 0.64 0.70  CALCIUM 8.6  --    Liver Function Tests:  Recent Labs Lab 08/23/13 1250  AST 14  ALT 21  ALKPHOS 63  BILITOT 0.4  PROT 7.2  ALBUMIN 3.4*   CBC:  Recent Labs Lab 08/23/13 1250 08/23/13 1257  WBC 4.8  --   NEUTROABS 1.4*  --   HGB 11.4* 12.6  HCT 35.3* 37.0  MCV 87.8  --   PLT 213  --    Coagulation:  Recent Labs Lab 08/23/13 1250  LABPROT 14.7  INR 1.15   Cardiac Enzymes: No results found for this basename: CKTOTAL, CKMB, CKMBINDEX, TROPONINI,  in the last 168 hours Urinalysis:  Recent Labs Lab 08/24/13 0428  COLORURINE YELLOW  LABSPEC 1.016  PHURINE 6.5  GLUCOSEU NEGATIVE  HGBUR NEGATIVE  BILIRUBINUR NEGATIVE  KETONESUR NEGATIVE  PROTEINUR NEGATIVE  UROBILINOGEN 0.2  NITRITE NEGATIVE  LEUKOCYTESUR NEGATIVE   Lipid Panel    Component Value Date/Time   CHOL 168 08/24/2013 0226   TRIG 139 08/24/2013 0226   HDL 34* 08/24/2013 0226  CHOLHDL 4.9 08/24/2013 0226   VLDL 28 08/24/2013 0226   LDLCALC 106* 08/24/2013 0226   HgbA1C  Lab Results  Component Value Date   HGBA1C 6.0* 08/23/2013    Urine Drug Screen:     Component Value Date/Time   LABOPIA NONE DETECTED 08/24/2013 0428   COCAINSCRNUR NONE DETECTED 08/24/2013 0428   LABBENZ NONE DETECTED 08/24/2013 0428   AMPHETMU NONE DETECTED 08/24/2013 0428   THCU NONE DETECTED 08/24/2013 0428   LABBARB NONE DETECTED 08/24/2013 0428    Alcohol Level:  Recent Labs Lab 08/23/13 1250  ETH <11    Dg Chest 2 View 08/24/2013    No active cardiopulmonary disease.   Ct Head Wo Contrast 08/23/2013    No acute intracranial abnormality.  Stable arachnoid cyst in right middle cranial fossa.     Mr Jodene Nam Head Wo Contrast 08/23/2013    Moderate chronic small vessel ischemic changes elsewhere.  No major  vessel occlusion or correctable proximal stenosis. Moderate diffuse distal vessel atherosclerotic irregularity.      Mr Brain Wo Contrast 08/23/2013    Acute infarction within the right posterior limb internal capsule/ corona radiata. No swelling or hemorrhage.      Carotid Doppler  pending  2D Echocardiogram  pending   EKG - Sinus rhythm rate 79 beats per minute. For complete results please see formal report.    Therapy Recommendations pending  Physical Exam   Neurologic Examination:  General:  Mental Status:  Alert, oriented, thought content appropriate. Speech fluent without evidence of aphasia. Able to follow 3 step commands without difficulty.  Cranial Nerves:  II: Discs not visualized; Visual fields grossly normal, pupils equal, round, reactive to light and accommodation  III,IV, VI: ptosis not present, extra-ocular motions intact bilaterally  V,VII: smile symmetric, facial light touch sensation normal bilaterally  VIII: hearing normal bilaterally  IX,X: gag reflex present  XI: bilateral shoulder shrug  XII: midline tongue extension without atrophy or fasciculations  Motor:  Right : Upper extremity 5/5 - Left: Upper extremity 2-3/5  Right Lower extremity 5/5 - Left Lower extremity 1/5  Tone and bulk:normal tone throughout; no atrophy noted  Sensory: Light touch decreased in left upper and lower extremities Deep Tendon Reflexes:  1+ all over  Plantars:  Right: downgoing Left: downgoing  Cerebellar:  normal finger-to-nose, normal heel-to-shin test  Gait: no tested     ASSESSMENT Ms. JOAN HERSCHBERGER is a 63 y.o. female presenting with left hemiparesis, numbness, and dysarthria. T-PA therapy was not initiated as the patient's deficits were mild and improving. Head CT negative. MRI  - acute infarction within the right posterior limb internal capsule/ corona radiata. MRA - Moderate chronic small vessel ischemic changes elsewhere. Infarct believed to be thrombotic secondary  small vessel disease. On no antithrombotics prior to admission. Now on aspirin 81 mg orally every day and [place] trial medication for secondary stroke prevention. Patient with resultant worsening of left hemiparesis today. Stroke work up underway.   Enrolled in the [place] trial  Worsening of left-sided deficits this a.m. compared to last night  Hypertension - Losartan / hydrochlorothiazide on hold  Hyperlipidemia - cholesterol 168; LDL 106 - will need statin  Hemoglobin A1c 6.0 - Goal less than 7 - no history of diabetes   Hospital day # 1  TREATMENT/PLAN  Continue aspirin 81 mg orally every day and [place] trial medication for secondary stroke prevention.  Stat CT of the head this a.m. for worsening deficits  Await carotid Dopplers and  2-D echo  Await therapy evaluations  Add statin  Mikey Bussing PA-C [facility] Pager [phone] 08/24/2013, 9:20 AM   SIGNED  I have personally examined this patient, reviewed notes, independently viewed imaging studies, participated in medical decision making and plan of care. I have made any additions or clarifications directly to the above note. Agree with note above. Patient notes worsening of her symptoms with increased left sided weakness and an increase in her NIH score from 3 to 7. Repeat head CT stable. Will continue ASA 81mg  daily at this time.   Jim Like [facility] Pager: [phone] 08/24/2013 10:42 AM    To contact Stroke Continuity provider, please refer to [email or web address] After hours, contact General Neurology

## 2013-08-24 NOTE — Progress Notes (Signed)
Echocardiogram 2D Echocardiogram has been performed.  Doyle Askew 08/24/2013, 11:07 AM

## 2013-08-25 LAB — GLUCOSE, CAPILLARY: Glucose-Capillary: 127 mg/dL — ABNORMAL HIGH (ref 70–99)

## 2013-08-25 NOTE — Evaluation (Signed)
Clinical/Bedside Swallow Evaluation Patient Details  Name: [name] MRN: [id number] Date of Birth: [birth year]  Today's Date: 08/25/2013 Time: 1130-1145 SLP Time Calculation (min): 15 min  Past Medical History:  Past Medical History  Diagnosis Date  . Hypertension   . Arthritis    Past Surgical History:  Past Surgical History  Procedure Laterality Date  . Joint replacement    . Replacement total knee Left    HPI:  Ms. [name] is a 63 y.o. female presenting with left hemiparesis, numbness, and dysarthria. T-PA therapy was not initiated as the patient's deficits were mild and improving. Head CT negative. MRI  - acute infarction within the right posterior limb internal capsule/ corona radiata.    Assessment / Plan / Recommendation Clinical Impression  Pt demonstrates adequate tolerance of thin liquids via cup and straw. She does have mild left labial weakness and suspected velopharyngeal weakness but neither appears to impact funciton with PO intake. She is reported to have had some coughing episodes this am with water but cough response would indiacte adequate sensation and airway protection with possible spillage events. Discussed basic precautions. Pt may continue current diet. No f/u for swallowing. See next note for speech/language eval.     Aspiration Risk  Mild    Diet Recommendation Regular;Thin liquid   Liquid Administration via: Cup;Straw Medication Administration: Whole meds with liquid Supervision: Patient able to self feed Compensations: Slow rate;Small sips/bites Postural Changes and/or Swallow Maneuvers: Seated upright 90 degrees    Other  Recommendations Oral Care Recommendations: Oral care BID   Follow Up Recommendations  Inpatient Rehab    Frequency and Duration        Pertinent Vitals/Pain NA    SLP Swallow Goals     Swallow Study Prior Functional Status  Type of Home: House Available Help at Discharge: Family;Available  PRN/intermittently    General HPI: Ms. CIERRA ROTHGEB is a 63 y.o. female presenting with left hemiparesis, numbness, and dysarthria. T-PA therapy was not initiated as the patient's deficits were mild and improving. Head CT negative. MRI  - acute infarction within the right posterior limb internal capsule/ corona radiata.  Type of Study: Bedside swallow evaluation Previous Swallow Assessment: none Diet Prior to this Study: Regular;Thin liquids Temperature Spikes Noted: No Respiratory Status: Room air History of Recent Intubation: No Behavior/Cognition: Alert;Cooperative;Pleasant mood Oral Cavity - Dentition: Dentures, top;Edentulous Self-Feeding Abilities: Able to feed self Patient Positioning: Upright in chair Baseline Vocal Quality: Clear Volitional Cough: Strong Volitional Swallow: Able to elicit    Oral/Motor/Sensory Function Overall Oral Motor/Sensory Function: Impaired Labial ROM: Reduced left Labial Symmetry: Abnormal symmetry left Labial Strength: Reduced Labial Sensation: Reduced Lingual ROM: Within Functional Limits Lingual Symmetry: Within Functional Limits Lingual Strength: Within Functional Limits Lingual Sensation: Within Functional Limits Facial ROM: Reduced left Facial Symmetry: Left droop Facial Strength: Reduced Facial Sensation: Reduced Velum: Impaired left Mandible: Within Functional Limits   Ice Chips     Thin Liquid Thin Liquid: Within functional limits Presentation: Cup;Straw    Nectar Thick Nectar Thick Liquid: Not tested   Honey Thick Honey Thick Liquid: Not tested   Puree Puree: Within functional limits   Solid   GO    Solid: Within functional limits       [name], [name] 08/25/2013,12:50 PM

## 2013-08-25 NOTE — Progress Notes (Signed)
Rehab Admissions Coordinator Note:  Patient was screened by Retta Diones for appropriateness for an Inpatient Acute Rehab Consult.  At this time, we are recommending Inpatient Rehab consult.  Jodell Cipro M 08/25/2013, 9:02 AM  I can be reached at [phone].

## 2013-08-25 NOTE — Consult Note (Signed)
Physical Medicine and Rehabilitation Consult Reason for Consult: CVA Referring Physician: [facility]   HPI: [name] is a 63 y.o. right handed female with history of hypertension. Patient lives alone and was independent prior to admission. Admitted 08/23/2013 with left-sided weakness of acute onset after returning home from as well as slurred speech. MRI showed acute infarction within the right posterior limb internal capsule/corona radiata. MRA of the head with no major occlusion or stenosis. Echocardiogram with ejection fraction of 54% grade 1 diastolic dysfunction. Carotid Dopplers with no ICA stenosis. Patient did not receive TPA. Neurology consulted placed on aspirin for CVA prophylaxis. She is tolerating a regular consistency diet. Physical and occupational therapy evaluation completed 08/25/2013 with recommendations of physical medicine rehabilitation consult.   Review of Systems  Eyes: Positive for pain.  Musculoskeletal: Positive for joint pain and myalgias.  Neurological: Positive for dizziness and headaches.  All other systems reviewed and are negative.  Past Medical History  Diagnosis Date  . Hypertension   . Arthritis    Past Surgical History  Procedure Laterality Date  . Joint replacement    . Replacement total knee Left    No family history on file. Social History:  reports that she has never smoked. She does not have any smokeless tobacco history on file. She reports that she does not drink alcohol or use illicit drugs. Allergies: No Known Allergies Medications Prior to Admission  Medication Sig Dispense Refill  . losartan-hydrochlorothiazide (HYZAAR) 50-12.5 MG per tablet Take 1 tablet by mouth daily.        Home: Home Living Family/patient expects to be discharged to:: Private residence Living Arrangements: Alone Available Help at Discharge: Family;Available PRN/intermittently Type of Home: House Home Access: Stairs to enter [employer] of  Steps: 2 Entrance Stairs-Rails: None Home Layout: One level Home Equipment: None  Functional History: Prior Function Level of Independence: Independent Functional Status:  Mobility: Bed Mobility Overal bed mobility: Needs Assistance Bed Mobility: Supine to Sit Rolling: Mod assist;Min assist (Min to left; Mod to right) Sidelying to sit: Mod assist Supine to sit: +2 for physical assistance;Mod assist Sit to supine: Mod assist General bed mobility comments: Verbal cues for technique, physical assist for L LE and trunk. Transfers Overall transfer level: Needs assistance Transfers: Sit to/from Stand Sit to Stand: +2 physical assistance;Mod assist Stand pivot transfers: +2 physical assistance;Mod assist General transfer comment: assist for weight shift, to advance L LE, and verbal cues for technique and hand placement Ambulation/Gait Ambulation/Gait assistance: Max assist;+2 physical assistance Ambulation Distance (Feet): 8 Feet (X2, with static pre gait weight shifts) Assistive device:  (gait belt, 3 musketeer approach) Gait Pattern/deviations: Step-to pattern;Decreased dorsiflexion - left;Decreased weight shift to right;Shuffle;Trunk flexed Gait velocity: decreased Gait velocity interpretation: <1.8 ft/sec, indicative of risk for recurrent falls General Gait Details: gait belt, 3 musketeer approach, VCs for upright posture and weight shifting, manual advancement of LLE required and VCs for quad setting.  Manual knee block LLE when fatigued    ADL: ADL Overall ADL's : Needs assistance/impaired Eating/Feeding: Minimal assistance;Sitting Eating/Feeding Details (indicate cue type and reason): noted coughing with drinking, RN notified, ST consult pending Grooming: Wash/dry hands;Wash/dry face;Moderate assistance;Sitting Upper Body Bathing: Moderate assistance;Sitting Lower Body Bathing: Total assistance;+2 for physical assistance;Sit to/from stand Upper Body Dressing : Moderate  assistance;Sitting Lower Body Dressing: Total assistance;+2 for physical assistance;Sit to/from stand Toilet Transfer: +2 for physical assistance;Stand-pivot;BSC;Moderate assistance Toileting- Clothing Manipulation and Hygiene: +2 for physical assistance;Total assistance;Sit to/from stand Functional  mobility during ADLs: +2 for physical assistance;Moderate assistance  Cognition: Cognition Overall Cognitive Status: Within Functional Limits for tasks assessed Orientation Level: Oriented X4 Cognition Arousal/Alertness: Awake/alert Behavior During Therapy: WFL for tasks assessed/performed Overall Cognitive Status: Within Functional Limits for tasks assessed  Blood pressure 117/72, pulse 77, temperature 98.3 F (36.8 C), temperature source Oral, resp. rate 13, height 5' 3.6" (1.615 m), weight 107.8 kg (237 lb 10.5 oz), SpO2 93.00%. Physical Exam  HENT:   Left Facial droop  Eyes: EOM are normal.  Neck: Normal range of motion. Neck supple. No thyromegaly present.  Cardiovascular: Normal rate and regular rhythm.   Respiratory: Effort normal and breath sounds normal. No respiratory distress.  GI: Soft. Bowel sounds are normal. She exhibits no distension.  Neurological: She is alert.  Speech is dysarthric but intelligible. She is oriented to person, place and date of birth. She did follow simple commands. Left central 7 and tongue deviation. Sensation 1/2 LUE and LLE. LUE: 0/5 prox to distal, only shoulder shrug. LLE: 1/5 HF, 1+ KE, 0/5 ADP/APF. Has reasonable insight and awareness, memory.   Skin: Skin is warm and dry.    No results found for this or any previous visit (from the past 24 hour(s)). Dg Chest 2 View  08/24/2013   CLINICAL DATA:  Acute cerebral infarction.  EXAM: CHEST  2 VIEW  COMPARISON:  04/21/2013  FINDINGS: The heart size and mediastinal contours are within normal limits. Both lungs are clear. No evidence of pleural effusion. Mild tortuosity of thoracic aorta is stable.  Thoracic spine degenerative changes again noted.  IMPRESSION: No active cardiopulmonary disease.   Electronically Signed   By: Earle Gell M.D.   On: 08/24/2013 08:02   Ct Head Wo Contrast  08/24/2013   CLINICAL DATA:  Recent stroke.  Worsening neuro deficits.  EXAM: CT HEAD WITHOUT CONTRAST  TECHNIQUE: Contiguous axial images were obtained from the base of the skull through the vertex without intravenous contrast.  COMPARISON:  MRI and CT, 08/23/2013  FINDINGS: Focal hypoattenuation extending superiorly from the right base a ganglia has a vault consistent with a subacute infarct.  Patchy hypoattenuation elsewhere in the white matter is stable consistent with chronic microvascular ischemic change. No cortical infarct.  No parenchymal masses or mass effect. Ventricles are normal in size and configuration.  Right middle cranial fossa arachnoid cyst, stable. No other extra-axial abnormalities.  No intracranial hemorrhage.  Visualized sinuses and mastoid air cells are clear. No skull lesion.  IMPRESSION: Right basal ganglia to right corona radiata infarct has evolved on CT, with its extent similar 12 was noted on the prior MRI. There is no evidence of a new infarct. No intracranial hemorrhage.   Electronically Signed   By: Lajean Manes M.D.   On: 08/24/2013 10:31   Mr Jodene Nam Head Wo Contrast  08/23/2013   CLINICAL DATA:  Stroke.  Hypertension.  Left-sided weakness.  EXAM: MRI HEAD WITHOUT CONTRAST  MRA HEAD WITHOUT CONTRAST  TECHNIQUE: Multiplanar, multiecho pulse sequences of the brain and surrounding structures were obtained without intravenous contrast. Angiographic images of the head were obtained using MRA technique without contrast.  COMPARISON:  Head CT same day and 06/03/2007  FINDINGS: MRI HEAD FINDINGS  Diffusion imaging shows acute infarction in the posterior limb internal capsule/corona radiata on the right. No other acute infarction.  The brainstem and cerebellum appear normal. The cerebral hemispheres  show moderate chronic small vessel ischemic changes within the white matter. Chronic benign arachnoid cyst at the anterior  middle cranial fossa on the right is unchanged. No hydrocephalus. No pituitary mass. No inflammatory sinus disease.  MRA HEAD FINDINGS  Both internal carotid arteries are patent into the brain. No siphon stenosis. The anterior and middle cerebral vessels are patent bilaterally. There is some atherosclerotic narrowing and irregularity without a correctable dominant stenosis.  Both vertebral arteries are patent to the basilar. No basilar stenosis. Posterior circulation branch vessels are patent, but also show diffuse atherosclerotic irregularity.  IMPRESSION: Acute infarction within the right posterior limb internal capsule/ corona radiata. No swelling or hemorrhage.  Moderate chronic small vessel ischemic changes elsewhere.  No major vessel occlusion or correctable proximal stenosis. Moderate diffuse distal vessel atherosclerotic irregularity.   Electronically Signed   By: Nelson Chimes M.D.   On: 08/23/2013 15:30   Mr Brain Wo Contrast  08/23/2013   CLINICAL DATA:  Stroke.  Hypertension.  Left-sided weakness.  EXAM: MRI HEAD WITHOUT CONTRAST  MRA HEAD WITHOUT CONTRAST  TECHNIQUE: Multiplanar, multiecho pulse sequences of the brain and surrounding structures were obtained without intravenous contrast. Angiographic images of the head were obtained using MRA technique without contrast.  COMPARISON:  Head CT same day and 06/03/2007  FINDINGS: MRI HEAD FINDINGS  Diffusion imaging shows acute infarction in the posterior limb internal capsule/corona radiata on the right. No other acute infarction.  The brainstem and cerebellum appear normal. The cerebral hemispheres show moderate chronic small vessel ischemic changes within the white matter. Chronic benign arachnoid cyst at the anterior middle cranial fossa on the right is unchanged. No hydrocephalus. No pituitary mass. No inflammatory sinus disease.   MRA HEAD FINDINGS  Both internal carotid arteries are patent into the brain. No siphon stenosis. The anterior and middle cerebral vessels are patent bilaterally. There is some atherosclerotic narrowing and irregularity without a correctable dominant stenosis.  Both vertebral arteries are patent to the basilar. No basilar stenosis. Posterior circulation branch vessels are patent, but also show diffuse atherosclerotic irregularity.  IMPRESSION: Acute infarction within the right posterior limb internal capsule/ corona radiata. No swelling or hemorrhage.  Moderate chronic small vessel ischemic changes elsewhere.  No major vessel occlusion or correctable proximal stenosis. Moderate diffuse distal vessel atherosclerotic irregularity.   Electronically Signed   By: Nelson Chimes M.D.   On: 08/23/2013 15:30    Assessment/Plan: Diagnosis: Right PLIC infarct with dense left hemiparesis 1. Does the need for close, 24 hr/day medical supervision in concert with the patient's rehab needs make it unreasonable for this patient to be served in a less intensive setting? Yes 2. Co-Morbidities requiring supervision/potential complications: anemia, chronic pain 3. Due to bladder management, bowel management, safety, skin/wound care, disease management, medication administration, pain management and patient education, does the patient require 24 hr/day rehab nursing? Yes 4. Does the patient require coordinated care of a physician, rehab nurse, PT (1-2 hrs/day, 5 days/week), OT (1-2 hrs/day, 5 days/week) and SLP (1-2 hrs/day, 5 days/week) to address physical and functional deficits in the context of the above medical diagnosis(es)? Yes Addressing deficits in the following areas: balance, endurance, locomotion, strength, transferring, bowel/bladder control, bathing, dressing, feeding, grooming, toileting, speech, swallowing and psychosocial support 5. Can the patient actively participate in an intensive therapy program of at least  3 hrs of therapy per day at least 5 days per week? Yes 6. The potential for patient to make measurable gains while on inpatient rehab is excellent 7. Anticipated functional outcomes upon discharge from inpatient rehab are supervision and min assist  with PT, supervision and  min assist with OT, modified independent and supervision with SLP. 8. Estimated rehab length of stay to reach the above functional goals is: 20-25 days 9. Does the patient have adequate social supports to accommodate these discharge functional goals? Yes 10. Anticipated D/C setting: Home 11. Anticipated post D/C treatments: HH therapy and Outpatient therapy 12. Overall Rehab/Functional Prognosis: excellent  RECOMMENDATIONS: This patient's condition is appropriate for continued rehabilitative care in the following setting: CIR Patient has agreed to participate in recommended program. Yes Note that insurance prior authorization may be required for reimbursement for recommended care.  Comment: Rehab Admissions Coordinator to follow up.  Thanks,  Meredith Staggers, MD, Mellody Drown     08/25/2013

## 2013-08-25 NOTE — Care Management Note (Signed)
    Page 1 of 1   08/26/2013     1:56:09 PM CARE MANAGEMENT NOTE 08/26/2013  Patient:  [name], [name]   Account Number:  [id number]  Date Initiated:  08/25/2013  Documentation initiated by:  GRAVES-BIGELOW,BRENDA  Subjective/Objective Assessment:   Pt admitted for weakness and dysarthria.  MRI showed Acute infarction within the right posterior limb internal capsule/corona radiata.     Action/Plan:   Pt has family support. CIR to consult. CM will continue to monitor for disposition needs.   Anticipated DC Date:  08/26/2013   Anticipated DC Plan:  IP REHAB FACILITY      DC Planning Services  CM consult      Choice offered to / List presented to:             Status of service:  Completed, signed off Medicare Important Message given?  YES (If response is "NO", the following Medicare IM given date fields will be blank) Date Medicare IM given:  08/25/2013 Medicare IM given by:  GRAVES-BIGELOW,BRENDA Date Additional Medicare IM given:   Additional Medicare IM given by:    Discharge Disposition:  IP REHAB FACILITY  Per UR Regulation:  Reviewed for med. necessity/level of care/duration of stay  If discussed at [place] of Stay Meetings, dates discussed:    Comments:  08/26/13- 56- Marvetta Gibbons RN, BSN [phone] Pt for d/c to [facility] today- spoke with pt and daughter at bedside regarding questions about reapplying for Medicaid- no further CM needs at this time. Manuela Schwartz with CIR to f/u on admission to CIR later today.

## 2013-08-25 NOTE — Progress Notes (Signed)
Stroke Team Progress Note  HISTORY [name] is a 63 y.o. female, right handed, with a past medical history significant for HTN, arthritis, brought in via EMS as a code stroke due to acute onset of left hemiparesis, dysarthria, and left-sided numbness. She was home when she suddenly notice inability to move the left side and speech difficulty. Never had similar symptoms before. EMS was summoned and they reported that patient was weak mainly in the left arm and had some speech changes.  No associated HA, vertigo, double vision, difficulty swallowing, confusion, or visual disturbances. No chest pain, SOB, or palpitations.  Initial NIHSS 3, but subsequently NIHSS 2  CT brain showed no acute abnormality.  Currently, she is alert, answering questions appropriately, but said that " my speech sounds stupid".  Patient was enrolled in the [place] trial.  Date last known well: 08/23/13  Time last known well: 12:15 pm  tPA Given: no, mild deficits and rapidly improving.  NIHSS: 3 (sensory, speech, left leg drift)  SUBJECTIVE No acute events overnight. Family is at the bedside. The patient is alert and conversant, and complains of L sided weakness, similar to yesterday.    OBJECTIVE Most recent Vital Signs: Filed Vitals:   08/24/13 2304 08/25/13 0356 08/25/13 0400 08/25/13 0729  BP: 107/50 124/65 124/65 127/82  Pulse: 67 70 69 73  Temp: 98.1 F (36.7 C) 98.5 F (36.9 C)  98.6 F (37 C)  TempSrc: Oral Oral  Oral  Resp: 13 14 14 13   Height:      Weight:      SpO2: 93% 95% 95% 95%   CBG (last 3)   Recent Labs  08/23/13 1240  GLUCAP 123*    IV Fluid Intake:     MEDICATIONS  .  stroke: mapping our early stages of recovery book   Does not apply Once  . aspirin EC  81 mg Oral Daily  . atorvastatin  40 mg Oral q1800  . metoprolol tartrate  12.5 mg Oral BID  . research study medication  75 mg Oral Q breakfast   PRN:  senna-docusate  Diet:  Cardiac with thin  liquids Activity:   Bedrest DVT Prophylaxis:  SCDs  CLINICALLY SIGNIFICANT STUDIES Basic Metabolic Panel:   Recent Labs Lab 08/23/13 1250 08/23/13 1257  NA 143 142  K 3.7 3.6*  CL 104 105  CO2 25  --   GLUCOSE 118* 120*  BUN 10 9  CREATININE 0.64 0.70  CALCIUM 8.6  --    Liver Function Tests:   Recent Labs Lab 08/23/13 1250  AST 14  ALT 21  ALKPHOS 63  BILITOT 0.4  PROT 7.2  ALBUMIN 3.4*   CBC:   Recent Labs Lab 08/23/13 1250 08/23/13 1257  WBC 4.8  --   NEUTROABS 1.4*  --   HGB 11.4* 12.6  HCT 35.3* 37.0  MCV 87.8  --   PLT 213  --    Coagulation:   Recent Labs Lab 08/23/13 1250  LABPROT 14.7  INR 1.15   Cardiac Enzymes: No results found for this basename: CKTOTAL, CKMB, CKMBINDEX, TROPONINI,  in the last 168 hours Urinalysis:   Recent Labs Lab 08/24/13 0428  COLORURINE YELLOW  LABSPEC 1.016  PHURINE 6.5  GLUCOSEU NEGATIVE  HGBUR NEGATIVE  BILIRUBINUR NEGATIVE  KETONESUR NEGATIVE  PROTEINUR NEGATIVE  UROBILINOGEN 0.2  NITRITE NEGATIVE  LEUKOCYTESUR NEGATIVE   Lipid Panel    Component Value Date/Time   CHOL 168 08/24/2013 0226   TRIG 139  08/24/2013 0226   HDL 34* 08/24/2013 0226   CHOLHDL 4.9 08/24/2013 0226   VLDL 28 08/24/2013 0226   LDLCALC 106* 08/24/2013 0226   HgbA1C  Lab Results  Component Value Date   HGBA1C 6.1* 08/24/2013    Urine Drug Screen:     Component Value Date/Time   LABOPIA NONE DETECTED 08/24/2013 0428   COCAINSCRNUR NONE DETECTED 08/24/2013 0428   LABBENZ NONE DETECTED 08/24/2013 0428   AMPHETMU NONE DETECTED 08/24/2013 0428   THCU NONE DETECTED 08/24/2013 0428   LABBARB NONE DETECTED 08/24/2013 0428    Alcohol Level:   Recent Labs Lab 08/23/13 1250  ETH <11    Dg Chest 2 View 08/24/2013    No active cardiopulmonary disease.   Ct Head Wo Contrast 08/23/2013    No acute intracranial abnormality.  Stable arachnoid cyst in right middle cranial fossa.     Mr Jodene Nam Head Wo Contrast 08/23/2013    Moderate chronic small vessel  ischemic changes elsewhere.  No major vessel occlusion or correctable proximal stenosis. Moderate diffuse distal vessel atherosclerotic irregularity.      Mr Brain Wo Contrast 08/23/2013    Acute infarction within the right posterior limb internal capsule/ corona radiata. No swelling or hemorrhage.     Carotid Doppler  08/24/13 <39% stenosis bilat  2D Echocardiogram  08/24/13 Mild-mod LVH, EF 60-65%, no PFO, no definite cardiac source of emboli identified   EKG 08/24/13 Sinus rhythm rate 79 beats per minute. For complete results please see formal report.    Therapy Recommendations CIR  Physical Exam   Neurologic Examination:  General:  Mental Status:  Alert, oriented, thought content appropriate. Speech fluent without evidence of aphasia. Able to follow 3 step commands without difficulty.  Cranial Nerves:  II: Discs not visualized; Visual fields grossly normal, pupils equal, round, reactive to light and accommodation  III,IV, VI: ptosis not present, extra-ocular motions intact bilaterally  V,VII: L facial weakness, facial light touch sensation mildly diminished on L  VIII: hearing normal bilaterally  IX,X: gag reflex present  XI: bilateral shoulder shrug  XII: midline tongue extension without atrophy or fasciculations  Motor:  Right : Upper extremity 5/5 - Left: Upper extremity 2/5  Right Lower extremity 5/5 - Left Lower extremity 4/5 extension, 1/5 flexion, 0/5 dorsiflexion Tone and bulk:normal tone throughout; no atrophy noted  Sensory: Light touch decreased in left upper and lower extremities Deep Tendon Reflexes:  1+ all over  Plantars:  Right: downgoing Left: upgoing Cerebellar:  normal finger-to-nose, normal heel-to-shin test  Gait: not tested   ASSESSMENT Ms. [name] is a 63 y.o. female with prior hx of HTN, past smoking, and OSA presenting with left hemiparesis, numbness, and dysarthria. T-PA therapy was not initiated as the patient's deficits were mild and  improving. Head CT negative. MRI  - acute infarction within the right posterior limb internal capsule/ corona radiata. MRA - Moderate chronic small vessel ischemic changes elsewhere. Infarct believed to be thrombotic secondary small vessel disease. On no antithrombotics prior to admission. Now on aspirin 81 mg orally every day and [place] trial medication for secondary stroke prevention. Patient with resultant worsening of left hemiparesis. Stroke work up underway.  Stroke: Acute infarct R posterior internal capsule/corona radiata, likely secondary to small vessel ischemic disease  Enrolled in the [place] trial, will get ASA 81 mg daily plus Plavix or Placebo  Carotid US and 2D Echo unremarkable  LDL 106  HgbA1c 6.0, elevated. Educate patient about lifestyle changes for diabetes  prevention  Pt with sign of OSA, will need sleep study as outpatient  Hypertension   Holding home Losartan / hydrochlorothiazide 50/12.5 mg daily for permissive HTN  BP 98-136/72-84 in last 24 hours  Hyperlipidemia  LDL 106   continue atorvastatin 40 mg daily  Dispo: Therapy recs CIR, Rehab consult pending  Hospital day # 2   SIGNED Delbert Phenix, MSN, ANP-C, CNRN, MSCS [facility] Stroke Team [phone]  I, the attending vascular neurologist, have personally obtained a history, examined the patient, evaluated laboratory data, individually viewed imaging studies, and formulated the assessment and plan of care.  I have made any additions or clarifications directly to the above note and agree with the findings and plan as currently documented.   Rosalin Hawking, MD PhD 08/25/2013 6:44 PM   To contact Stroke Continuity provider, please refer to [email or web address] After hours, contact General Neurology

## 2013-08-25 NOTE — Progress Notes (Signed)
Physical Therapy Treatment Patient Details Name: [name] MRN: [id number] DOB: 10/05/1950 Today's Date: 08/25/2013    History of Present Illness Patient is a 63 yo female admitted 08/23/13 with Lt-sided weakness and dysarthria.  MRI showed Acute infarction within the right posterior limb internal capsule/corona radiata.  Baseline NIHSS = 3, and NIHSS am of 7/19 was 7.   PMH: HTN, Lt TKA.    PT Comments    Patient initially seen by PT, then coordinated session with OT to address mobility and functional activities. Patient tolerated static standing with assist and minimal ambulation. Manual cues required for advancement of LLE. Patient OOB to chair, and performed transfer to and from commode as well. Patient highly motivated today. Will continue to see as indicated and progress as tolerated, continue to recommend CIR.  Follow Up Recommendations  CIR     Equipment Recommendations  Rolling walker with 5" wheels;Wheelchair (measurements PT);Wheelchair cushion (measurements PT)    Recommendations for Other Services Rehab consult     Precautions / Restrictions Precautions Precautions: Fall Precaution Comments: Lt hemiparesis Restrictions Weight Bearing Restrictions: No    Mobility  Bed Mobility Overal bed mobility: Needs Assistance Bed Mobility: Supine to Sit     Supine to sit: +2 for physical assistance;Mod assist     General bed mobility comments: Verbal cues for technique, physical assist for L LE and trunk.  Transfers Overall transfer level: Needs assistance   Transfers: Sit to/from Stand Sit to Stand: +2 physical assistance;Mod assist Stand pivot transfers: +2 physical assistance;Mod assist       General transfer comment: assist for weight shift, to advance L LE, and verbal cues for technique and hand placement  Ambulation/Gait Ambulation/Gait assistance: Max assist;+2 physical assistance Ambulation Distance (Feet): 8 Feet (X2, with static pre gait weight  shifts) Assistive device:  (gait belt, 3 musketeer approach) Gait Pattern/deviations: Step-to pattern;Decreased dorsiflexion - left;Decreased weight shift to right;Shuffle;Trunk flexed Gait velocity: decreased Gait velocity interpretation: <1.8 ft/sec, indicative of risk for recurrent falls General Gait Details: gait belt, 3 musketeer approach, VCs for upright posture and weight shifting, manual advancement of LLE required and VCs for quad setting.  Manual knee block LLE when fatigued   Stairs            Wheelchair Mobility    Modified Rankin (Stroke Patients Only) Modified Rankin (Stroke Patients Only) Pre-Morbid Rankin Score: No symptoms Modified Rankin: Severe disability     Balance Overall balance assessment: Needs assistance Sitting-balance support: Feet supported Sitting balance-Leahy Scale: Fair     Standing balance support: Bilateral upper extremity supported Standing balance-Leahy Scale: Poor                      Cognition Arousal/Alertness: Awake/alert Behavior During Therapy: WFL for tasks assessed/performed Overall Cognitive Status: Within Functional Limits for tasks assessed                      Exercises      General Comments        Pertinent Vitals/Pain No pain at this time, VSS    Home Living Family/patient expects to be discharged to:: Private residence Living Arrangements: Alone Available Help at Discharge: Family;Available PRN/intermittently Type of Home: House Home Access: Stairs to enter Entrance Stairs-Rails: None Home Layout: One level Home Equipment: None      Prior Function Level of Independence: Independent          PT Goals (current goals can now be found  in the care plan section) Acute Rehab PT Goals Patient Stated Goal: To get better PT Goal Formulation: With patient Time For Goal Achievement: 09/07/13 Potential to Achieve Goals: Good Progress towards PT goals: Progressing toward goals     Frequency  Min 4X/week    PT Plan Current plan remains appropriate    Co-evaluation PT/OT/SLP Co-Evaluation/Treatment: Yes Reason for Co-Treatment: Complexity of the patient's impairments (multi-system involvement) PT goals addressed during session: Mobility/safety with mobility OT goals addressed during session: ADL's and self-care     End of Session Equipment Utilized During Treatment: Gait belt Activity Tolerance: Patient tolerated treatment well Patient left: in chair;with call bell/phone within reach;with family/visitor present     Time: 0927-1009 PT Time Calculation (min): 42 min  Charges:  $Gait Training: 8-22 mins $Therapeutic Activity: 8-22 mins                    G CodesDuncan Dull 24-Sep-2013, 12:18 PM Alben Deeds, [place] DPT  [phone]

## 2013-08-25 NOTE — Progress Notes (Signed)
[facility] TEAM 1 - Stepdown/ICU TEAM Progress Note  [name] UEA:[id number] DOB: [birth year] DOA: 08/23/2013 PCP: Elyn Peers, MD  Admit HPI / Brief Narrative: 63 y.o.F with a history of hypertension who presented to the ED with complaints of left-sided weakness. Patient began feeling weakness and numbness in her left upper and lower extremity. EMS was called and patient was noted to have decreased strength in the left upper and lower extremity. She did not have any facial drooping. Patient did have some speech changes.    In the emergency department, CT of the head was done which was negative for stroke. Neurology was consulted. Patient was in the window for TPA however since her symptoms were improving, TPA was not given.  HPI/Subjective: Pt sitting comfortably in bedside chair.  Notes no new complaints.    Assessment/Plan:  Acute CVA -PT/OT suggest CIR - awaiting bed offer in rehab unit  -Speech has cleared for regular diet w/ thin liquid, and suggests ongoing therapy for dysarthria -Patient is in a study per Neurology   Essential HTN -BP currently well controlled - avoid new agents for now due to risk for relative hypotension   Diastolic CHF -no ACE/ARB presently due to borderline low BP for clinical scenario - well compensated at this time   Borderline diabetes -A1c= 6.1 - will need to be followed as an outpt   HLD -Lipitor 40 mg daily  Normocytic anemia -Stable  Code Status: FULL Family Communication: no family present at time of exam Disposition Plan: awaiting CIR bed - if not available 7/21 will transfer to neuro medical bed   Consultants: Dr. Jim Like (neurology)  Procedure/Significant Events: 7/18 CT head without contrast No acute abnormality. Stable arachnoid cyst Rt middle cranial fossa. 7/18 MRA head without contrast; Acute infarction w/i Rt posterior limb internal capsule/ corona radiata. - Moderate chronic small vessel ischemic changes - Moderate  diffuse distal vessel atherosclerotic irregularity. 7/19 CT head without contrast ;Right basal ganglia to right corona radiata infarct has evolved on  CT, with its extent as noted on the prior MRI. -No evidence of a new infarct. No intracranial hemorrhage.   7/19 echocardiogram - Left ventricle:  mild to moderate LVH. -LVEF= 60% to 65%.  -(grade 1 diastolic dysfunction).  7/19 carotid Doppler:1-39% internal carotid artery stenosis bilaterally. Vertebral arteries patent with antegrade flow.  Antibiotics: NA  DVT prophylaxis: aspirin 81 mg orally every day and [place] Trial medication for secondary stroke prevention  Objective: VITAL SIGNS: Blood pressure 117/72, pulse 77, temperature 98.3 F (36.8 C), temperature source Oral, resp. rate 13, height 5' 3.6" (1.615 m), weight 107.8 kg (237 lb 10.5 oz), SpO2 93.00%.  Intake/Output Summary (Last 24 hours) at 08/25/13 1455 Last data filed at 08/25/13 1200  Gross per 24 hour  Intake    240 ml  Output   1050 ml  Net   -810 ml   Exam: General: No acute respiratory distress Lungs: Clear to auscultation bilaterally without wheezes or crackles Cardiovascular: Regular rate and rhythm without murmur gallop or rub normal S1 and S2 Abdomen: Nontender, nondistended, soft, bowel sounds positive, no rebound, no ascites, no appreciable mass Extremities: No significant cyanosis, clubbing, or edema bilateral lower extremities  Data Reviewed: Basic Metabolic Panel:  Recent Labs Lab 08/23/13 1250 08/23/13 1257  NA 143 142  K 3.7 3.6*  CL 104 105  CO2 25  --   GLUCOSE 118* 120*  BUN 10 9  CREATININE 0.64 0.70  CALCIUM 8.6  --  Liver Function Tests:  Recent Labs Lab 08/23/13 1250  AST 14  ALT 21  ALKPHOS 63  BILITOT 0.4  PROT 7.2  ALBUMIN 3.4*   CBC:  Recent Labs Lab 08/23/13 1250 08/23/13 1257  WBC 4.8  --   NEUTROABS 1.4*  --   HGB 11.4* 12.6  HCT 35.3* 37.0  MCV 87.8  --   PLT 213  --    CBG:  Recent Labs Lab  08/23/13 1240  GLUCAP 123*    Recent Results (from the past 240 hour(s))  MRSA PCR SCREENING     Status: None   Collection Time    08/23/13  5:48 PM      Result Value Ref Range Status   MRSA by PCR NEGATIVE  NEGATIVE Final   Comment:            The GeneXpert MRSA Assay (FDA     approved for NASAL specimens     only), is one component of a     comprehensive MRSA colonization     surveillance program. It is not     intended to diagnose MRSA     infection nor to guide or     monitor treatment for     MRSA infections.     Studies:  Recent x-ray studies have been reviewed in detail by the Attending Physician  Scheduled Meds:  Scheduled Meds: .  stroke: mapping our early stages of recovery book   Does not apply Once  . aspirin EC  81 mg Oral Daily  . atorvastatin  40 mg Oral q1800  . metoprolol tartrate  12.5 mg Oral BID  . research study medication  75 mg Oral Q breakfast    Time spent on care of this patient: 25 mins  Cherene Altes, MD [facility] For Consults/Admissions - Flow Manager - [phone] Office  [phone] Pager [phone]  On-Call/Text Page:      [email or web address]      password [facility]  08/25/2013, 2:55 PM   LOS: 2 days

## 2013-08-25 NOTE — Evaluation (Signed)
Occupational Therapy Evaluation Patient Details Name: [name] MRN: [id number] DOB: [birth year] Today's Date: 08/25/2013    History of Present Illness Patient is a 63 yo female admitted 08/23/13 with Lt-sided weakness and dysarthria.  MRI showed Acute infarction within the right posterior limb internal capsule/corona radiata.  Baseline NIHSS = 3, and NIHSS am of 7/19 was 7.   PMH: HTN, Lt TKA.   Clinical Impression   Prior to admission, pt was performing ADL and IADL at an independent level.  Pt now presents with L side weakness and balance deficits interfering with ability to perform ADL.  Mobility requires +2 assist.  Pt is highly motivated and has good family support.  She will be an ideal inpatient rehab candidate.  Will follow acutely.   Follow Up Recommendations  CIR    Equipment Recommendations   (to be determined in next venue)    Recommendations for Other Services Rehab consult     Precautions / Restrictions Precautions Precautions: Fall Precaution Comments: Lt hemiparesis Restrictions Weight Bearing Restrictions: No      Mobility Bed Mobility   Bed Mobility: Supine to Sit     Supine to sit: +2 for physical assistance;Mod assist     General bed mobility comments: Verbal cues for technique, physical assist for L LE and trunk.  Transfers Overall transfer level: Needs assistance   Transfers: Sit to/from Stand;Stand Pivot Transfers Sit to Stand: +2 physical assistance;Mod assist Stand pivot transfers: +2 physical assistance;Mod assist       General transfer comment: assist for weight shift, to advance L LE, and verbal cues for technique and hand placement    Balance Overall balance assessment: Needs assistance Sitting-balance support: Feet supported Sitting balance-Leahy Scale: Fair     Standing balance support: Bilateral upper extremity supported Standing balance-Leahy Scale: Poor                              ADL Overall ADL's : Needs  assistance/impaired Eating/Feeding: Minimal assistance;Sitting Eating/Feeding Details (indicate cue type and reason): noted coughing with drinking, RN notified, ST consult pending Grooming: Wash/dry hands;Wash/dry face;Moderate assistance;Sitting   Upper Body Bathing: Moderate assistance;Sitting   Lower Body Bathing: Total assistance;+2 for physical assistance;Sit to/from stand   Upper Body Dressing : Moderate assistance;Sitting   Lower Body Dressing: Total assistance;+2 for physical assistance;Sit to/from stand   Toilet Transfer: +2 for physical assistance;Stand-pivot;BSC;Moderate assistance   Toileting- Clothing Manipulation and Hygiene: +2 for physical assistance;Total assistance;Sit to/from stand       Functional mobility during ADLs: +2 for physical assistance;Moderate assistance       Vision                     Perception     Praxis      Pertinent Vitals/Pain VSS, no pain     Hand Dominance Right   Extremity/Trunk Assessment Upper Extremity Assessment Upper Extremity Assessment: LUE deficits/detail LUE Deficits / Details: [place] Level 3 LUE Sensation: decreased proprioception LUE Coordination: decreased fine motor;decreased gross motor   Lower Extremity Assessment Lower Extremity Assessment: Defer to PT evaluation   Cervical / Trunk Assessment Cervical / Trunk Assessment: Normal   Communication Communication Communication: Expressive difficulties   Cognition Arousal/Alertness: Awake/alert Behavior During Therapy: WFL for tasks assessed/performed Overall Cognitive Status: Within Functional Limits for tasks assessed                     General  Comments       Exercises       Shoulder Instructions      Home Living Family/patient expects to be discharged to:: Private residence Living Arrangements: Alone Available Help at Discharge: Family;Available PRN/intermittently Type of Home: House Home Access: Stairs to enter [employer] of Steps: 2 Entrance Stairs-Rails: None Home Layout: One level     Bathroom Shower/Tub: Teacher, early years/pre: Handicapped height     Home Equipment: None          Prior Functioning/Environment Level of Independence: Independent             OT Diagnosis: Generalized weakness;Hemiplegia non-dominant side   OT Problem List: Decreased strength;Decreased activity tolerance;Impaired balance (sitting and/or standing);Decreased coordination;Decreased knowledge of use of DME or AE;Decreased knowledge of precautions;Impaired sensation;Obesity;Impaired UE functional use   OT Treatment/Interventions: Self-care/ADL training;Neuromuscular education;Therapeutic activities;DME and/or AE instruction;Patient/family education;Balance training    OT Goals(Current goals can be found in the care plan section) Acute Rehab OT Goals Patient Stated Goal: To get better OT Goal Formulation: With patient Time For Goal Achievement: 09/08/13 Potential to Achieve Goals: Good ADL Goals Pt Will Perform Grooming: with set-up;sitting Pt Will Perform Upper Body Bathing: with min assist;sitting Pt Will Perform Upper Body Dressing: with min assist;sitting Pt Will Transfer to Toilet: with min assist;stand pivot transfer;bedside commode Pt Will Perform Toileting - Clothing Manipulation and hygiene: sit to/from stand;with min assist Pt/caregiver will Perform Home Exercise Program: Increased strength;Left upper extremity;With Supervision;With written HEP provided Additional ADL Goal #1: Pt will perform bed mobility with supervision in preparation for ADL at EOB. Additional ADL Goal #2: Pt will utilize L UE as a functional assist for bilateral tasks.  OT Frequency: Min 3X/week   Barriers to D/C:            Co-evaluation PT/OT/SLP Co-Evaluation/Treatment: Yes Reason for Co-Treatment: Complexity of the patient's impairments (multi-system involvement)   OT goals addressed during  session: ADL's and self-care      End of Session Equipment Utilized During Treatment: Gait belt Nurse Communication: Mobility status (swallowing concerns)  Activity Tolerance: Patient tolerated treatment well Patient left: in chair;with call bell/phone within reach;with family/visitor present   Time: 0932-1009 OT Time Calculation (min): 37 min Charges:  OT General Charges $OT Visit: 1 Procedure OT Evaluation $Initial OT Evaluation Tier I: 1 Procedure OT Treatments $Self Care/Home Management : 8-22 mins G-Codes:    Malka So 08/25/2013, 10:59 AM [phone]

## 2013-08-25 NOTE — Evaluation (Signed)
Speech Language Pathology Evaluation Patient Details Name: [name] MRN: [id number] DOB: Nov 06, 1950 Today's Date: 08/25/2013 Time: 1130-1155 SLP Time Calculation (min): 25 min  Problem List:  Patient Active Problem List   Diagnosis Date Noted  . CVA (cerebral infarction) 08/23/2013  . Stroke 08/23/2013  . Pre-syncope 08/17/2012  . Hypokalemia 08/17/2012  . ARTHRITIS 03/28/2010  . DE QUERVAIN'S TENOSYNOVITIS 03/28/2010  . ANEMIA 07/26/2009  . REACTIVE AIRWAY DISEASE 03/09/2009  . DENTAL CARIES 03/09/2009  . CANDIDIASIS OF VULVA AND VAGINA 06/04/2008  . SKIN TAG 06/04/2008  . ARTHRITIS, KNEES, BILATERAL 05/22/2008  . HYPERTENSION, BENIGN ESSENTIAL 05/04/2008  . KNEE PAIN, BILATERAL 05/04/2008  . COUGH 05/04/2008  . MENISCUS TEAR, RIGHT 12/07/2005   Past Medical History:  Past Medical History  Diagnosis Date  . Hypertension   . Arthritis    Past Surgical History:  Past Surgical History  Procedure Laterality Date  . Joint replacement    . Replacement total knee Left    HPI:  [name] is a 63 y.o. female presenting with left hemiparesis, numbness, and dysarthria. T-PA therapy was not initiated as the patient's deficits were mild and improving. Head CT negative. MRI  - acute infarction within the right posterior limb internal capsule/ corona radiata.    Assessment / Plan / Recommendation Clinical Impression  Pt demonstrates a mild dysarthria impacted primarily by changes in resonance, most notable at phrase and conversation level. Pt has mild dysarthria at baseline due to mising dentition, but reports a signficant change in speech since CVA. Unable to view velum for assessment on velopharyngeal function, but suspect sluggish mobility for appropriate changes for oral and nasal resonance. Pt may benefit from compensatory strategy training for articulation. Will f/u for education.     SLP Assessment  Patient needs continued Speech Lanaguage Pathology Services     Follow Up Recommendations  Inpatient Rehab    Frequency and Duration min 2x/week  1 week   Pertinent Vitals/Pain NA   SLP Goals  SLP Goals Potential to Achieve Goals: Good  SLP Evaluation Prior Functioning  Cognitive/Linguistic Baseline: Within functional limits Type of Home: House   Cognition  Overall Cognitive Status: Within Functional Limits for tasks assessed Orientation Level: Oriented X4    Comprehension  Auditory Comprehension Overall Auditory Comprehension: Appears within functional limits for tasks assessed    Expression Verbal Expression Overall Verbal Expression: Appears within functional limits for tasks assessed   Oral / Motor Oral Motor/Sensory Function Overall Oral Motor/Sensory Function: Impaired Labial ROM: Reduced left Labial Symmetry: Abnormal symmetry left Labial Strength: Reduced Labial Sensation: Reduced Lingual ROM: Within Functional Limits Lingual Symmetry: Within Functional Limits Lingual Strength: Within Functional Limits Lingual Sensation: Within Functional Limits Facial ROM: Reduced left Facial Symmetry: Left droop Facial Strength: Reduced Facial Sensation: Reduced Velum: Impaired left Mandible: Within Functional Limits Motor Speech Overall Motor Speech: Impaired Respiration: Within functional limits Phonation: Normal Resonance: Hyponasality Articulation: Impaired Level of Impairment: Phrase Intelligibility: Intelligible Motor Planning: Witnin functional limits Motor Speech Errors: Aware Interfering Components: Inadequate dentition Effective Techniques: Slow rate;Increased vocal intensity   GO    Herbie Baltimore, MA CCC-SLP [phone]  Lynann Beaver 08/25/2013, 2:13 PM

## 2013-08-26 ENCOUNTER — Inpatient Hospital Stay (HOSPITAL_COMMUNITY)
Admission: RE | Admit: 2013-08-26 | Discharge: 2013-09-18 | DRG: 945 | Disposition: A | Discharge status: Home / Self Care | Payer: Medicare Other | Source: Intra-hospital | Attending: Physical Medicine & Rehabilitation | Admitting: Physical Medicine & Rehabilitation

## 2013-08-26 ENCOUNTER — Encounter (HOSPITAL_COMMUNITY): Payer: Self-pay | Admitting: *Deleted

## 2013-08-26 DIAGNOSIS — Z5189 Encounter for other specified aftercare: Principal | ICD-10-CM

## 2013-08-26 DIAGNOSIS — I5032 Chronic diastolic (congestive) heart failure: Secondary | ICD-10-CM

## 2013-08-26 DIAGNOSIS — I739 Peripheral vascular disease, unspecified: Secondary | ICD-10-CM | POA: Diagnosis present

## 2013-08-26 DIAGNOSIS — G8110 Spastic hemiplegia affecting unspecified side: Secondary | ICD-10-CM | POA: Diagnosis not present

## 2013-08-26 DIAGNOSIS — I519 Heart disease, unspecified: Secondary | ICD-10-CM | POA: Diagnosis present

## 2013-08-26 DIAGNOSIS — M129 Arthropathy, unspecified: Secondary | ICD-10-CM | POA: Diagnosis present

## 2013-08-26 DIAGNOSIS — R350 Frequency of micturition: Secondary | ICD-10-CM | POA: Diagnosis not present

## 2013-08-26 DIAGNOSIS — L919 Hypertrophic disorder of the skin, unspecified: Secondary | ICD-10-CM

## 2013-08-26 DIAGNOSIS — F329 Major depressive disorder, single episode, unspecified: Secondary | ICD-10-CM | POA: Diagnosis not present

## 2013-08-26 DIAGNOSIS — E785 Hyperlipidemia, unspecified: Secondary | ICD-10-CM | POA: Diagnosis present

## 2013-08-26 DIAGNOSIS — F3289 Other specified depressive episodes: Secondary | ICD-10-CM | POA: Diagnosis not present

## 2013-08-26 DIAGNOSIS — K029 Dental caries, unspecified: Secondary | ICD-10-CM

## 2013-08-26 DIAGNOSIS — Z6841 Body Mass Index (BMI) 40.0 and over, adult: Secondary | ICD-10-CM

## 2013-08-26 DIAGNOSIS — R7303 Prediabetes: Secondary | ICD-10-CM | POA: Diagnosis present

## 2013-08-26 DIAGNOSIS — Z96659 Presence of unspecified artificial knee joint: Secondary | ICD-10-CM

## 2013-08-26 DIAGNOSIS — I6330 Cerebral infarction due to thrombosis of unspecified cerebral artery: Secondary | ICD-10-CM | POA: Diagnosis not present

## 2013-08-26 DIAGNOSIS — I10 Essential (primary) hypertension: Secondary | ICD-10-CM | POA: Diagnosis not present

## 2013-08-26 DIAGNOSIS — I6350 Cerebral infarction due to unspecified occlusion or stenosis of unspecified cerebral artery: Secondary | ICD-10-CM | POA: Diagnosis present

## 2013-08-26 DIAGNOSIS — D649 Anemia, unspecified: Secondary | ICD-10-CM | POA: Diagnosis not present

## 2013-08-26 DIAGNOSIS — R7309 Other abnormal glucose: Secondary | ICD-10-CM | POA: Diagnosis not present

## 2013-08-26 DIAGNOSIS — I639 Cerebral infarction, unspecified: Secondary | ICD-10-CM | POA: Diagnosis present

## 2013-08-26 DIAGNOSIS — L909 Atrophic disorder of skin, unspecified: Secondary | ICD-10-CM

## 2013-08-26 MED ORDER — ONDANSETRON HCL 4 MG/2ML IJ SOLN: 4.0000 mg | Freq: Four times a day (QID) | INTRAMUSCULAR | Status: DC | PRN

## 2013-08-26 MED ORDER — ASPIRIN 81 MG PO TBEC: 81.0000 mg | DELAYED_RELEASE_TABLET | Freq: Every day | ORAL | Status: DC

## 2013-08-26 MED ORDER — METOPROLOL TARTRATE 12.5 MG HALF TABLET
12.5000 mg | ORAL_TABLET | Freq: Two times a day (BID) | ORAL | Status: DC
  Administered 2013-08-26 – 2013-09-18 (×46): 12.5 mg via ORAL
  Filled 2013-08-26 (×48): qty 1

## 2013-08-26 MED ORDER — SORBITOL 70 % SOLN: 30.0000 mL | Freq: Every day | Status: DC | PRN

## 2013-08-26 MED ORDER — METOPROLOL TARTRATE 12.5 MG HALF TABLET
12.5000 mg | ORAL_TABLET | Freq: Two times a day (BID) | ORAL | Status: DC
Start: 2013-08-26 — End: 2013-09-18

## 2013-08-26 MED ORDER — ASPIRIN EC 81 MG PO TBEC
81.0000 mg | DELAYED_RELEASE_TABLET | Freq: Every day | ORAL | Status: DC
  Administered 2013-08-27 – 2013-09-18 (×23): 81 mg via ORAL
  Filled 2013-08-26 (×25): qty 1

## 2013-08-26 MED ORDER — STUDY - INVESTIGATIONAL DRUG SIMPLE RECORD
75.0000 mg | Freq: Every day | Status: DC
  Administered 2013-08-27 – 2013-09-18 (×23): 75 mg via ORAL
  Filled 2013-08-26 (×19): qty 75

## 2013-08-26 MED ORDER — STUDY - INVESTIGATIONAL DRUG SIMPLE RECORD: 75.0000 mg | Freq: Every day | Status: DC

## 2013-08-26 MED ORDER — ATORVASTATIN CALCIUM 40 MG PO TABS
40.0000 mg | ORAL_TABLET | Freq: Every day | ORAL | Status: DC
  Administered 2013-08-27 – 2013-09-17 (×22): 40 mg via ORAL
  Filled 2013-08-26 (×23): qty 1

## 2013-08-26 MED ORDER — ACETAMINOPHEN 325 MG PO TABS
325.0000 mg | ORAL_TABLET | ORAL | Status: DC | PRN
  Administered 2013-08-28: 650 mg via ORAL
  Filled 2013-08-26: qty 2

## 2013-08-26 MED ORDER — SENNOSIDES-DOCUSATE SODIUM 8.6-50 MG PO TABS: 1.0000 | ORAL_TABLET | Freq: Every evening | ORAL | Status: DC | PRN

## 2013-08-26 MED ORDER — ATORVASTATIN CALCIUM 40 MG PO TABS: 40.0000 mg | ORAL_TABLET | Freq: Every day | ORAL | Status: DC

## 2013-08-26 MED ORDER — ONDANSETRON HCL 4 MG PO TABS: 4.0000 mg | ORAL_TABLET | Freq: Four times a day (QID) | ORAL | Status: DC | PRN

## 2013-08-26 NOTE — Progress Notes (Signed)
[name] Rehab Admission Coordinator Signed Physical Medicine and Rehabilitation PMR Pre-admission Service date: 08/26/2013 1:34 PM  Related encounter: Admission (Current) from 08/23/2013 in [facility]   PMR Admission Coordinator Pre-Admission Assessment  Patient: [name] is an 63 y.o., female  MRN: [id number]  DOB: 1950-05-24  Height: 5' 3.6" (161.5 cm)  Weight: 107.8 kg (237 lb 10.5 oz)  Insurance Information  HMO: PPO: PC IPA: 80/20: OTHER:  PRIMARY: Medicare A and B Policy#: [id number] Subscriber: self  CM Name: Phone#: Fax#:  Pre-Cert#: Employer: [employer], per pt.  Benefits: Phone #: Name:  Eff. Date: 07/07/2009 Deduct: $1260 Out of Pocket Max: none Life Max: non  CIR: 100% SNF: 100% first 20 days per Medicare guidelines  Outpatient: 80% Co-Pay: 20%  Home Health: 100% per guidelines Co-Pay: none  DME: 80% Co-Pay: 20%  Providers: Pt. choice SECONDARY: Policy#: Subscriber:  CM Name: Phone#: Fax#:  Pre-Cert#: Employer:  Benefits: Phone #: Name:  Eff. Date: Deduct: Out of Pocket Max: Life Max:  CIR: SNF:  Outpatient: Co-Pay:  Home Health: Co-Pay:  DME: Co-Pay:  Medicaid Application Date: Case Manager:  Disability Application Date: Case Worker:  Emergency Contact Information    Contact Information     Name  Relation  Home  Work  Mobile     Epperly,Aretha  Daughter  [phone]          Current Medical History  Patient Admitting Diagnosis: Right PLIC infarct with dense left hemiparesis  History of Present Illness: [name] is a 63 y.o. right handed female with history of hypertension. Patient lives alone and was independent prior to admission. Admitted 08/23/2013 with left-sided weakness of acute onset after returning home from work as well as slurred speech. MRI showed acute infarction within the right posterior limb internal capsule/corona radiata. MRA of the head with no major occlusion or stenosis. Echocardiogram  with ejection fraction of 41% grade 1 diastolic dysfunction. Carotid Dopplers with no ICA stenosis. Patient did not receive TPA. Neurology consulted placed on aspirin for CVA prophylaxis as well as enrollment in the Point trial study. She is tolerating a regular consistency diet. Physical and occupational therapy evaluation completed 08/25/2013 with recommendations of physical medicine rehabilitation consult. Patient was admitted for comprehensive rehabilitation program.  Total: 6 NIH   Past Medical History    Past Medical History    Diagnosis  Date    .  Hypertension     .  Arthritis      Family History  family history is not on file.  Prior Rehab/Hospitalizations: No prior rehab history  Current Medications  Current facility-administered medications: stroke: mapping our early stages of recovery book, , Does not apply, Once, [employer], DO; aspirin EC tablet 81 mg, 81 mg, Oral, Daily, Dorian Pod, MD, 81 mg at 08/26/13 1014; atorvastatin (LIPITOR) tablet 40 mg, 40 mg, Oral, q1800, Allie Bossier, MD, 40 mg at 08/25/13 1838; metoprolol tartrate (LOPRESSOR) tablet 12.5 mg, 12.5 mg, Oral, BID, Allie Bossier, MD, 12.5 mg at 08/26/13 1014  POINT study - Placebo / clopidogrel (daily dose) (PI-Sethi), 75 mg, Oral, Q breakfast, Dorian Pod, MD, 75 mg at 08/26/13 7408; senna-docusate (Senokot-S) tablet 1 tablet, 1 tablet, Oral, QHS PRN, Cristal Ford, DO  Patients Current Diet: heart healthy, thin liquids  Precautions / Restrictions  Precautions  Precautions: Fall  Precaution Comments: Lt hemiparesis  Restrictions  Weight Bearing Restrictions: No  Prior Activity Level  Community (5-7x/wk): 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 drives and is independent in her self care.  Home Assistive Devices / Equipment  Home Assistive Devices/Equipment: None  Home Equipment: None  Prior Functional Level  Prior Function  Level of Independence: Independent   Current Functional Level    Cognition  Overall Cognitive Status: Within Functional Limits for tasks assessed  Orientation Level: Oriented X4    Extremity Assessment  (includes Sensation/Coordination)      ADLs  Overall ADL's : Needs assistance/impaired  Eating/Feeding: Minimal assistance;Sitting  Eating/Feeding Details (indicate cue type and reason): noted coughing with drinking, RN notified, ST consult pending  Grooming: Wash/dry hands;Wash/dry face;Moderate assistance;Sitting  Upper Body Bathing: Moderate assistance;Sitting  Lower Body Bathing: Total assistance;+2 for physical assistance;Sit to/from stand  Upper Body Dressing : Moderate assistance;Sitting  Lower Body Dressing: Total assistance;+2 for physical assistance;Sit to/from stand  Toilet Transfer: +2 for physical assistance;Stand-pivot;BSC;Moderate assistance  Toileting- Clothing Manipulation and Hygiene: +2 for physical assistance;Total assistance;Sit to/from stand  Functional mobility during ADLs: +2 for physical assistance;Moderate assistance    Mobility  Overal bed mobility: Needs Assistance  Bed Mobility: Supine to Sit  Rolling: Mod assist;Min assist (Min to left; Mod to right)  Sidelying to sit: Mod assist  Supine to sit: +2 for physical assistance;Mod assist  Sit to supine: Mod assist  General bed mobility comments: Verbal cues for technique, physical assist for L LE and trunk.    Transfers  Overall transfer level: Needs assistance  Transfers: Sit to/from Stand  Sit to Stand: +2 physical assistance;Mod assist  Stand pivot transfers: +2 physical assistance;Mod assist  General transfer comment: assist for weight shift, to advance L LE, and verbal cues for technique and hand placement    Ambulation / Gait / Stairs / Wheelchair Mobility  Ambulation/Gait  Ambulation/Gait assistance: Max assist;+2 physical assistance  Ambulation Distance (Feet): 8 Feet (X2, with static pre gait weight shifts)  Assistive device: (gait  belt, 3 musketeer approach)  Gait Pattern/deviations: Step-to pattern;Decreased dorsiflexion - left;Decreased weight shift to right;Shuffle;Trunk flexed  Gait velocity: decreased  Gait velocity interpretation: <1.8 ft/sec, indicative of risk for recurrent falls  General Gait Details: gait belt, 3 musketeer approach, VCs for upright posture and weight shifting, manual advancement of LLE required and VCs for quad setting. Manual knee block LLE when fatigued    Posture / Balance  Dynamic Sitting Balance  Sitting balance - Comments: Patient with Lt side rotated posteriorly. Cues to bring shoulder and UE forward in sitting.    Special needs/care consideration  BiPAP/CPAP no  CPM no  Continuous Drip IV no  Dialysis no  Life Vest no  Oxygen no  Special Bed no  Trach Size no  Wound Vac (area) no  Skin Per RN, intact  Bowel mgmt: continent, +2 transfers onto 3 n 1 commode  Bladder mgmt: some incontinence and stress incontinence  Diabetic mgmt no    Previous Home Environment  Living Arrangements: Other relatives;Other (Comment) (pt's 17 year old granddaughter and her 2 y.o. live with pt.)  Available Help at Discharge: Family;Available 24 hours/day  Type of Home: House  Home Layout: One level  Home Access: Stairs to enter  Entrance Stairs-Rails: Advice worker of Steps: 3 (per pt's daughter)  Bathroom Shower/Tub: Administrator, Civil Service: Handicapped height  Bathroom Accessibility: Yes  How Accessible: Accessible via walker  [place]: No  Discharge Living Setting  Plans for Discharge Living Setting: Patient's home  Type of  Home at Discharge: House  Discharge Home Layout: One level  Discharge Home Access: Stairs to enter  Entrance Stairs-Rails: Right;Left  Entrance Stairs-Number of Steps: 3  Discharge Bathroom Shower/Tub: Tub/shower unit  Discharge Bathroom Toilet: Handicapped height  Discharge Bathroom Accessibility: Yes  How Accessible:  Accessible via walker  Does the patient have any problems obtaining your medications?: Yes (Describe) (per daughter, difficult to afford)  Social/Family/Support Systems  Patient Roles: Parent;Other (Comment) (employed 30 hours per week)  Anticipated Caregiver: pt's daughter Aniceto Boss reports that the 3 local daughters will rotate assisting their Mom and providing 90 hour assist. Pt's 44 year old granddaugter Caryl Pina, who lives in the home, also able to assist but works. Another grandchild, Roselyn Reef (grandson age 87) plans to assist intermittently. Pt's 3 local daughters work but will plan to rotate assisting their mom.  Anticipated Caregiver's Contact Information: Aniceto Boss (daughter) [phone]; daughter April [phone]; daughter Ross Ludwig [phone]  Ability/Limitations of Caregiver: daughter Aniceto Boss had a hysterectomy last week and is on lifting restrictions. She states she will be out of work 2 months.  Caregiver Availability: 24/7  Discharge Plan Discussed with Primary Caregiver: Yes  Is Caregiver In Agreement with Plan?: Yes  Does Caregiver/Family have Issues with Lodging/Transportation while Pt is in Rehab?: No  Goals/Additional Needs  Patient/Family Goal for Rehab: supervision to min assist with PT/OT; mod (I) to supervision with SLP  Expected length of stay: 20-25 days  Cultural Considerations: none  Dietary Needs: heart healthy, thin liquids  Equipment Needs: TBD  Pt/Family Agrees to Admission and willing to participate: Yes  Program Orientation Provided & Reviewed with Pt/Caregiver Including Roles & Responsibilities: Yes  Decrease burden of Care through IP rehab admission: no  Possible need for SNF placement upon discharge: Not anticipated  Patient Condition: This patient's condition remains as documented in the consult dated 08/26/13 , in which the Rehabilitation Physician determined and documented that the patient's condition is appropriate for intensive rehabilitative care in an inpatient  rehabilitation facility. Will admit to inpatient rehab today.  Preadmission Screen Completed By: [name], 08/26/2013 1:34 PM  ______________________________________________________________________  Discussed status with Dr. Naaman Plummer on 08/26/13 at 1350 and received telephone approval for admission today.  Admission Coordinator: [name], time 1350/Date 08/26/13.    Cosigned by: Meredith Staggers, MD [08/26/2013 2:02 PM]

## 2013-08-26 NOTE — PMR Pre-admission (Signed)
PMR Admission Coordinator Pre-Admission Assessment  Patient: [name] is an 63 y.o., female MRN: [id number] DOB: 1950/10/19 Height: 5' 3.6" (161.5 cm) Weight: 107.8 kg (237 lb 10.5 oz)              Insurance Information HMO:    PPO:      PC     IPA:       80/20:       OTHER:   PRIMARY:  Medicare A and B      Policy#: [id number]      Subscriber:  self CM Name:        Phone#:       Fax#:   Pre-Cert#:        Employer:  [employer], per pt. Benefits:  Phone #:      Name:  Eff. Date: 07/07/2009     Deduct:  $1260      Out of Pocket Max:  none      Life Max:  non CIR: 100%      SNF:  100% first 20 days per Medicare guidelines Outpatient:  80%     Co-Pay:  20% Home Health:  100% per guidelines     Co-Pay:  none DME:  80%     Co-Pay:  20% Providers:  Pt. choice SECONDARY:        Policy#:       Subscriber:  CM Name:       Phone#:      Fax#:  Pre-Cert#:       Employer:  Benefits:  Phone #:      Name:  Eff. Date:      Deduct:       Out of Pocket Max:       Life Max:  CIR:       SNF:  Outpatient:      Co-Pay:  Home Health:       Co-Pay:  DME:      Co-Pay:   Medicaid Application Date:       Case Manager:  Disability Application Date:       Case Worker:   Emergency Contact Information Contact Information   Name Relation Home Work Mobile   Shober,Aretha Daughter [phone]       Current Medical History  Patient Admitting Diagnosis: Right PLIC infarct with dense left hemiparesis  History of Present Illness: [name] is a 63 y.o. right handed female with history of hypertension. Patient lives alone and was independent prior to admission. Admitted 08/23/2013 with left-sided weakness of acute onset after returning home from work as well as slurred speech. MRI showed acute infarction within the right posterior limb internal capsule/corona radiata. MRA of the head with no major occlusion or stenosis. Echocardiogram with ejection fraction of 01% grade 1 diastolic dysfunction.  Carotid Dopplers with no ICA stenosis. Patient did not receive TPA. Neurology consulted placed on aspirin for CVA prophylaxis as well as enrollment in the Point trial study. She is tolerating a regular consistency diet. Physical and occupational therapy evaluation completed 08/25/2013 with recommendations of physical medicine rehabilitation consult. Patient was admitted for comprehensive rehabilitation program.  Total: 6  NIH    Past Medical History  Past Medical History  Diagnosis Date  . Hypertension   . Arthritis     Family History  family history is not on file.  Prior Rehab/Hospitalizations:  No prior rehab history   Current Medications  Current facility-administered medications: stroke: mapping our early stages of  recovery book, , Does not apply, Once, [employer], DO;  aspirin EC tablet 81 mg, 81 mg, Oral, Daily, Dorian Pod, MD, 81 mg at 08/26/13 1014;  atorvastatin (LIPITOR) tablet 40 mg, 40 mg, Oral, q1800, Allie Bossier, MD, 40 mg at 08/25/13 3762;  metoprolol tartrate (LOPRESSOR) tablet 12.5 mg, 12.5 mg, Oral, BID, Allie Bossier, MD, 12.5 mg at 08/26/13 1014 POINT study - Placebo / clopidogrel (daily dose)  (PI-Sethi), 75 mg, Oral, Q breakfast, Dorian Pod, MD, 75 mg at 08/26/13 8315;  senna-docusate (Senokot-S) tablet 1 tablet, 1 tablet, Oral, QHS PRN, Cristal Ford, DO  Patients Current Diet: heart healthy, thin liquids   Precautions / Restrictions Precautions Precautions: Fall Precaution Comments: Lt hemiparesis Restrictions Weight Bearing Restrictions: No   Prior Activity Level Community (5-7x/wk): 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 drives and is independent in her self care.   Home Assistive Devices / Equipment Home Assistive Devices/Equipment: None Home Equipment: None  Prior Functional Level Prior Function Level of Independence: Independent  Current Functional Level Cognition  Overall Cognitive  Status: Within Functional Limits for tasks assessed Orientation Level: Oriented X4    Extremity Assessment (includes Sensation/Coordination)          ADLs  Overall ADL's : Needs assistance/impaired Eating/Feeding: Minimal assistance;Sitting Eating/Feeding Details (indicate cue type and reason): noted coughing with drinking, RN notified, ST consult pending Grooming: Wash/dry hands;Wash/dry face;Moderate assistance;Sitting Upper Body Bathing: Moderate assistance;Sitting Lower Body Bathing: Total assistance;+2 for physical assistance;Sit to/from stand Upper Body Dressing : Moderate assistance;Sitting Lower Body Dressing: Total assistance;+2 for physical assistance;Sit to/from stand Toilet Transfer: +2 for physical assistance;Stand-pivot;BSC;Moderate assistance Toileting- Clothing Manipulation and Hygiene: +2 for physical assistance;Total assistance;Sit to/from stand Functional mobility during ADLs: +2 for physical assistance;Moderate assistance    Mobility  Overal bed mobility: Needs Assistance Bed Mobility: Supine to Sit Rolling: Mod assist;Min assist (Min to left; Mod to right) Sidelying to sit: Mod assist Supine to sit: +2 for physical assistance;Mod assist Sit to supine: Mod assist General bed mobility comments: Verbal cues for technique, physical assist for L LE and trunk.    Transfers  Overall transfer level: Needs assistance Transfers: Sit to/from Stand Sit to Stand: +2 physical assistance;Mod assist Stand pivot transfers: +2 physical assistance;Mod assist General transfer comment: assist for weight shift, to advance L LE, and verbal cues for technique and hand placement    Ambulation / Gait / Stairs / Wheelchair Mobility  Ambulation/Gait Ambulation/Gait assistance: Max assist;+2 physical assistance Ambulation Distance (Feet): 8 Feet (X2, with static pre gait weight shifts) Assistive device:  (gait belt, 3 musketeer approach) Gait Pattern/deviations: Step-to  pattern;Decreased dorsiflexion - left;Decreased weight shift to right;Shuffle;Trunk flexed Gait velocity: decreased Gait velocity interpretation: <1.8 ft/sec, indicative of risk for recurrent falls General Gait Details: gait belt, 3 musketeer approach, VCs for upright posture and weight shifting, manual advancement of LLE required and VCs for quad setting.  Manual knee block LLE when fatigued    Posture / Balance Dynamic Sitting Balance Sitting balance - Comments: Patient with Lt side rotated posteriorly.  Cues to bring shoulder and UE forward in sitting.      Special needs/care consideration BiPAP/CPAP  no CPM  no Continuous Drip IV  no Dialysis  no         Life Vest  no Oxygen  no Special Bed  no Trach Size  no Wound Vac (area)  no       Skin  Per RN,  intact                              Bowel mgmt: continent, +2 transfers onto 3 n 1 commode Bladder mgmt: some incontinence and stress incontinence Diabetic mgmt no     Previous Home Environment Living Arrangements: Other relatives;Other (Comment) (pt's 16 year old granddaughter and her 2 y.o. live with pt.) Available Help at Discharge: Family;Available 24 hours/day Type of Home: House Home Layout: One level Home Access: Stairs to enter Entrance Stairs-Rails: Chemical engineer of Steps: 3 (per pt's daughter) Bathroom Shower/Tub: Chiropodist: Handicapped height Bathroom Accessibility: Yes How Accessible: Accessible via walker [place]: No  Discharge Living Setting Plans for Discharge Living Setting: Patient's home Type of Home at Discharge: House Discharge Home Layout: One level Discharge Home Access: Stairs to enter Entrance Stairs-Rails: [place] of Steps: 3 Discharge Bathroom Shower/Tub: Tub/shower unit Discharge Bathroom Toilet: Handicapped height Discharge Bathroom Accessibility: Yes How Accessible: Accessible via walker Does the patient have any  problems obtaining your medications?: Yes (Describe) (per daughter, difficult to afford)  Social/Family/Support Systems Patient Roles: Parent;Other (Comment) (employed 30 hours per week) Anticipated Caregiver: pt's daughter Aniceto Boss reports that the 3 local daughters will rotate assisting their Mom and providing 51 hour assist.  Pt's 62 year old granddaugter Caryl Pina, who lives in the home,  also able to assist but works.  Another grandchild, Roselyn Reef (grandson age 65) plans to assist intermittently.  Pt's 3 local daughters work but will plan to rotate assisting their mom.  Anticipated Caregiver's Contact Information: Aniceto Boss (daughter) [phone]; daughter April [phone]; daughter Ross Ludwig [phone] Ability/Limitations of Caregiver: daughter Aniceto Boss had a hysterectomy last week and is on lifting restrictions.  She states she will be out of work 2 months.   Caregiver Availability: 24/7 Discharge Plan Discussed with Primary Caregiver: Yes Is Caregiver In Agreement with Plan?: Yes Does Caregiver/Family have Issues with Lodging/Transportation while Pt is in Rehab?: No    Goals/Additional Needs Patient/Family Goal for Rehab: supervision to min assist with PT/OT; mod (I) to supervision with SLP Expected length of stay: 20-25 days Cultural Considerations: none Dietary Needs: heart healthy, thin liquids Equipment Needs: TBD Pt/Family Agrees to Admission and willing to participate: Yes Program Orientation Provided & Reviewed with Pt/Caregiver Including Roles  & Responsibilities: Yes   Decrease burden of Care through IP rehab admission: no   Possible need for SNF placement upon discharge:  Not anticipated   Patient Condition: This patient's condition remains as documented in the consult dated 08/26/13 , in which the Rehabilitation Physician determined and documented that the patient's condition is appropriate for intensive rehabilitative care in an inpatient rehabilitation facility. Will admit to  inpatient rehab today.  Preadmission Screen Completed By:  Gerlean Ren, 08/26/2013 1:34 PM ______________________________________________________________________   Discussed status with Dr.  Naaman Plummer on 08/26/13 at  1350  and received telephone approval for admission today.  Admission Coordinator:  Gerlean Ren, time 1350/Date 08/26/13.

## 2013-08-26 NOTE — Clinical Social Work Psychosocial (Signed)
Clinical Social Work Department BRIEF PSYCHOSOCIAL ASSESSMENT 08/26/2013  Patient:  [name], [name]     Account Number:  [id number]     Admit date:  08/23/2013  Clinical Social Worker:  Hubert Azure  Date/Time:  08/26/2013 01:45 PM  Referred by:  CSW  Date Referred:  08/26/2013 Referred for  SNF Placement   Other Referral:   Interview type:  Patient Other interview type:   Patient's daughter [name] was present at bedside.    PSYCHOSOCIAL DATA Living Status:  FAMILY Admitted from facility:   Level of care:   Primary support name:  Willadeen Colantuono [phone]) Primary support relationship to patient:  CHILD, ADULT Degree of support available:   Good    CURRENT CONCERNS Current Concerns  Post-Acute Placement   Other Concerns:    SOCIAL WORK ASSESSMENT / PLAN CSW met with patient and daughter [name]) who was present at bedside. CSW introduced and explained role. CSW explained SNF process and discussed d/c plan. Per [name] and patient, preference is for patient to return home and receive therapy at home, but CIR has been recommended and prefer to wait for authorization. Per [name], she has worked at [employer] and would not desire patient to d/c to any SNF, unless it is [facility] as patient has a daughter who is employed at [employer]. Patient is agreeable to [facility] as a back-up to CIR.    Per CIR Admission Coordinator, Manuela Schwartz, patient to be d/c to CIR.   Assessment/plan status:  No Further Intervention Required Other assessment/ plan:   Patient to be d/c to CIR. CSW signing off.   Information/referral to community resources:    PATIENT'S/FAMILY'S RESPONSE TO PLAN OF CARE: Patient and family pleasant and cooperative.   [place], [place] Weekend Clinical Social Worker [phone]

## 2013-08-26 NOTE — Interval H&P Note (Signed)
Monna C Davidovich was admitted today to Inpatient Rehabilitation with the diagnosis of right PLIC infarct.  The patient's history has been reviewed, patient examined, and there is no change in status.  Patient continues to be appropriate for intensive inpatient rehabilitation.  I have reviewed the patient's chart and labs.  Questions were answered to the patient's satisfaction.  Desyre Calma T 08/26/2013, 9:49 PM

## 2013-08-26 NOTE — H&P (Signed)
Physical Medicine and Rehabilitation Admission H&P    Chief Complaint  Patient presents with  . Code Stroke  :  Chief complaint: Left-sided weakness  HPI: [name] is a 63 y.o. right handed female with history of hypertension. Patient lives alone and was independent prior to admission. Admitted 08/23/2013 with left-sided weakness of acute onset after returning home from work as well as slurred speech. MRI showed acute infarction within the right posterior limb internal capsule/corona radiata. MRA of the head with no major occlusion or stenosis. Echocardiogram with ejection fraction of 16% grade 1 diastolic dysfunction. Carotid Dopplers with no ICA stenosis. Patient did not receive TPA. Neurology consulted placed on aspirin for CVA prophylaxis as well as enrollment in the Point trial study. She is tolerating a regular consistency diet. Physical and occupational therapy evaluation completed 08/25/2013 with recommendations of physical medicine rehabilitation consult. Patient was admitted for comprehensive rehabilitation program   ROS Review of Systems  Eyes: Positive for pain.  Musculoskeletal: Positive for joint pain and myalgias.  Neurological: Positive for dizziness and headaches.  All other systems reviewed and are negative  Past Medical History  Diagnosis Date  . Hypertension   . Arthritis    Past Surgical History  Procedure Laterality Date  . Joint replacement    . Replacement total knee Left    No family history on file. Social History:  reports that she has never smoked. She does not have any smokeless tobacco history on file. She reports that she does not drink alcohol or use illicit drugs. Allergies: No Known Allergies Medications Prior to Admission  Medication Sig Dispense Refill  . losartan-hydrochlorothiazide (HYZAAR) 50-12.5 MG per tablet Take 1 tablet by mouth daily.        Home: Home Living Family/patient expects to be discharged to:: Private  residence Living Arrangements: Alone Available Help at Discharge: Family;Available PRN/intermittently Type of Home: House Home Access: Stairs to enter [employer] of Steps: 2 Entrance Stairs-Rails: None Home Layout: One level Home Equipment: None   Functional History: Prior Function Level of Independence: Independent  Functional Status:  Mobility: Bed Mobility Overal bed mobility: Needs Assistance Bed Mobility: Supine to Sit Rolling: Mod assist;Min assist (Min to left; Mod to right) Sidelying to sit: Mod assist Supine to sit: +2 for physical assistance;Mod assist Sit to supine: Mod assist General bed mobility comments: Verbal cues for technique, physical assist for L LE and trunk. Transfers Overall transfer level: Needs assistance Transfers: Sit to/from Stand Sit to Stand: +2 physical assistance;Mod assist Stand pivot transfers: +2 physical assistance;Mod assist General transfer comment: assist for weight shift, to advance L LE, and verbal cues for technique and hand placement Ambulation/Gait Ambulation/Gait assistance: Max assist;+2 physical assistance Ambulation Distance (Feet): 8 Feet (X2, with static pre gait weight shifts) Assistive device:  (gait belt, 3 musketeer approach) Gait Pattern/deviations: Step-to pattern;Decreased dorsiflexion - left;Decreased weight shift to right;Shuffle;Trunk flexed Gait velocity: decreased Gait velocity interpretation: <1.8 ft/sec, indicative of risk for recurrent falls General Gait Details: gait belt, 3 musketeer approach, VCs for upright posture and weight shifting, manual advancement of LLE required and VCs for quad setting.  Manual knee block LLE when fatigued    ADL: ADL Overall ADL's : Needs assistance/impaired Eating/Feeding: Minimal assistance;Sitting Eating/Feeding Details (indicate cue type and reason): noted coughing with drinking, RN notified, ST consult pending Grooming: Wash/dry hands;Wash/dry face;Moderate  assistance;Sitting Upper Body Bathing: Moderate assistance;Sitting Lower Body Bathing: Total assistance;+2 for physical assistance;Sit to/from stand Upper Body Dressing : Moderate  assistance;Sitting Lower Body Dressing: Total assistance;+2 for physical assistance;Sit to/from stand Toilet Transfer: +2 for physical assistance;Stand-pivot;BSC;Moderate assistance Toileting- Clothing Manipulation and Hygiene: +2 for physical assistance;Total assistance;Sit to/from stand Functional mobility during ADLs: +2 for physical assistance;Moderate assistance  Cognition: Cognition Overall Cognitive Status: Within Functional Limits for tasks assessed Orientation Level: Oriented X4 Cognition Arousal/Alertness: Awake/alert Behavior During Therapy: WFL for tasks assessed/performed Overall Cognitive Status: Within Functional Limits for tasks assessed  Physical Exam: Blood pressure 121/65, pulse 71, temperature 98.5 F (36.9 C), temperature source Oral, resp. rate 11, height 5' 3.6" (1.615 m), weight 107.8 kg (237 lb 10.5 oz), SpO2 97.00%. Physical Exam Gen: no acute distress HENT: oral mucosa pink, dentition fair Left Facial droop  Eyes: EOM are normal.  Neck: Normal range of motion. Neck supple. No thyromegaly present.  Cardiovascular: Normal rate and regular rhythm. No murmurs, rubs Respiratory: Effort normal and breath sounds normal. No respiratory distress. No wheezes GI: Soft. Bowel sounds are normal. She exhibits no distension.  Neurological: She is alert.  Speech is dysarthric but intelligible. She is oriented to person, place and date of birth. She did follow simple commands. Left central 7 and tongue deviation. Sensation 1/2 LUE and LLE. LUE: 0/5 prox to distal, only shoulder shrug. LLE: 1/5 HF, 1+ KE, 0/5 ADP/APF. RUE grossly  4/5---appears to have functional use. RLE 3/5 prox to 4/5 distally. DTR's 1+ throughout.  Has reasonable insight and awareness, memory.  Skin: Skin is warm and dry Psych:  pleasant and appropriate, cooperative  Results for orders placed during the hospital encounter of 08/23/13 (from the past 48 hour(s))  GLUCOSE, CAPILLARY     Status: Abnormal   Collection Time    08/25/13  7:51 PM      Result Value Ref Range   Glucose-Capillary 127 (*) 70 - 99 mg/dL   Ct Head Wo Contrast  08/24/2013   CLINICAL DATA:  Recent stroke.  Worsening neuro deficits.  EXAM: CT HEAD WITHOUT CONTRAST  TECHNIQUE: Contiguous axial images were obtained from the base of the skull through the vertex without intravenous contrast.  COMPARISON:  MRI and CT, 08/23/2013  FINDINGS: Focal hypoattenuation extending superiorly from the right base a ganglia has a vault consistent with a subacute infarct.  Patchy hypoattenuation elsewhere in the white matter is stable consistent with chronic microvascular ischemic change. No cortical infarct.  No parenchymal masses or mass effect. Ventricles are normal in size and configuration.  Right middle cranial fossa arachnoid cyst, stable. No other extra-axial abnormalities.  No intracranial hemorrhage.  Visualized sinuses and mastoid air cells are clear. No skull lesion.  IMPRESSION: Right basal ganglia to right corona radiata infarct has evolved on CT, with its extent similar 12 was noted on the prior MRI. There is no evidence of a new infarct. No intracranial hemorrhage.   Electronically Signed   By: Lajean Manes M.D.   On: 08/24/2013 10:31       Medical Problem List and Plan: 1. Functional deficits secondary to right PLIC infarcts secondary to small vessel disease 2.  DVT Prophylaxis/Anticoagulation: SCDs. Monitor for any signs of DVT 3. Pain Management: Tylenol as needed 4. Hypertension. Lopressor 12.5 mg twice a day. Monitor with increased mobility 5. Neuropsych: This patient is capable of making decisions on her own behalf. 6. Hyperlipidemia. Lipitor     Post Admission Physician Evaluation: 1. Functional deficits secondary  to right PLIC infarct  . 2. Patient is admitted to receive collaborative, interdisciplinary care between the physiatrist, rehab nursing staff,  and therapy team. 3. Patient's level of medical complexity and substantial therapy needs in context of that medical necessity cannot be provided at a lesser intensity of care such as a SNF. 4. Patient has experienced substantial functional loss from his/her baseline which was documented above under the "Functional History" and "Functional Status" headings.  Judging by the patient's diagnosis, physical exam, and functional history, the patient has potential for functional progress which will result in measurable gains while on inpatient rehab.  These gains will be of substantial and practical use upon discharge  in facilitating mobility and self-care at the household level. 5. Physiatrist will provide 24 hour management of medical needs as well as oversight of the therapy plan/treatment and provide guidance as appropriate regarding the interaction of the two. 6. 24 hour rehab nursing will assist with bladder management, bowel management, safety, skin/wound care, disease management, medication administration, pain management and patient education  and help integrate therapy concepts, techniques,education, etc. 7. PT will assess and treat for/with: Lower extremity strength, range of motion, stamina, balance, functional mobility, safety, adaptive techniques and equipment, NMR, family/caregiver education, orthotics as appropriate, stroke education.   Goals are: supervision to min assist. 8. OT will assess and treat for/with: ADL's, functional mobility, safety, upper extremity strength, adaptive techniques and equipment, NMR, stroke education, community reintegration, care giver ed. Leisure activities.   Goals are: supervision to min assist. 9. SLP will assess and treat for/with: speech, swallowing, communication.  Goals are: mod I. 10. Case Management and Social Worker will assess and treat for  psychological issues and discharge planning. 11. Team conference will be held weekly to assess progress toward goals and to determine barriers to discharge. 12. Patient will receive at least 3 hours of therapy per day at least 5 days per week. 13. ELOS: 20-25 days       14. Prognosis:  excellent     Meredith Staggers, MD, [facility]   08/26/2013

## 2013-08-26 NOTE — Progress Notes (Signed)
Stroke Team Progress Note  HISTORY [name] is a 63 y.o. female, right handed, with a past medical history significant for HTN, arthritis, brought in via EMS as a code stroke due to acute onset of left hemiparesis, dysarthria, and left-sided numbness. She was home when she suddenly notice inability to move the left side and speech difficulty. Never had similar symptoms before. EMS was summoned and they reported that patient was weak mainly in the left arm and had some speech changes.  No associated HA, vertigo, double vision, difficulty swallowing, confusion, or visual disturbances. No chest pain, SOB, or palpitations.  Initial NIHSS 3, but subsequently NIHSS 2  CT brain showed no acute abnormality.  Currently, she is alert, answering questions appropriately, but said that " my speech sounds stupid".  Patient was enrolled in the [place] trial.  Date last known well: 08/23/13  Time last known well: 12:15 pm  tPA Given: no, mild deficits and rapidly improving.  NIHSS: 3 (sensory, speech, left leg drift)  SUBJECTIVE No acute events overnight. Family is at the bedside. The patient is alert and conversant, and complains of L sided weakness, increased from yesterday.    OBJECTIVE Most recent Vital Signs: Filed Vitals:   08/25/13 1438 08/25/13 1948 08/25/13 2315 08/26/13 0437  BP: 117/72 135/73 136/75 121/65  Pulse: 77 81 77 71  Temp: 98.3 F (36.8 C) 98.3 F (36.8 C) 98.3 F (36.8 C) 98.5 F (36.9 C)  TempSrc: Oral Oral Oral Oral  Resp: 13 18 16 11   Height:      Weight:      SpO2: 93% 95% 95% 97%   CBG (last 3)   Recent Labs  08/23/13 1240 08/25/13 1951  GLUCAP 123* 127*    IV Fluid Intake:     MEDICATIONS  .  stroke: mapping our early stages of recovery book   Does not apply Once  . aspirin EC  81 mg Oral Daily  . atorvastatin  40 mg Oral q1800  . metoprolol tartrate  12.5 mg Oral BID  . research study medication  75 mg Oral Q breakfast   PRN:  senna-docusate  Diet:   Cardiac with thin  liquids Activity:  Bedrest DVT Prophylaxis:  SCDs  CLINICALLY SIGNIFICANT STUDIES Basic Metabolic Panel:   Recent Labs Lab 08/23/13 1250 08/23/13 1257  NA 143 142  K 3.7 3.6*  CL 104 105  CO2 25  --   GLUCOSE 118* 120*  BUN 10 9  CREATININE 0.64 0.70  CALCIUM 8.6  --    Liver Function Tests:   Recent Labs Lab 08/23/13 1250  AST 14  ALT 21  ALKPHOS 63  BILITOT 0.4  PROT 7.2  ALBUMIN 3.4*   CBC:   Recent Labs Lab 08/23/13 1250 08/23/13 1257  WBC 4.8  --   NEUTROABS 1.4*  --   HGB 11.4* 12.6  HCT 35.3* 37.0  MCV 87.8  --   PLT 213  --    Coagulation:   Recent Labs Lab 08/23/13 1250  LABPROT 14.7  INR 1.15   Cardiac Enzymes: No results found for this basename: CKTOTAL, CKMB, CKMBINDEX, TROPONINI,  in the last 168 hours Urinalysis:   Recent Labs Lab 08/24/13 0428  COLORURINE YELLOW  LABSPEC 1.016  PHURINE 6.5  GLUCOSEU NEGATIVE  HGBUR NEGATIVE  BILIRUBINUR NEGATIVE  KETONESUR NEGATIVE  PROTEINUR NEGATIVE  UROBILINOGEN 0.2  NITRITE NEGATIVE  LEUKOCYTESUR NEGATIVE   Lipid Panel    Component Value Date/Time   CHOL 168  08/24/2013 0226   TRIG 139 08/24/2013 0226   HDL 34* 08/24/2013 0226   CHOLHDL 4.9 08/24/2013 0226   VLDL 28 08/24/2013 0226   LDLCALC 106* 08/24/2013 0226   HgbA1C  Lab Results  Component Value Date   HGBA1C 6.1* 08/24/2013    Urine Drug Screen:     Component Value Date/Time   LABOPIA NONE DETECTED 08/24/2013 0428   COCAINSCRNUR NONE DETECTED 08/24/2013 0428   LABBENZ NONE DETECTED 08/24/2013 0428   AMPHETMU NONE DETECTED 08/24/2013 0428   THCU NONE DETECTED 08/24/2013 0428   LABBARB NONE DETECTED 08/24/2013 0428    Alcohol Level:   Recent Labs Lab 08/23/13 1250  ETH <11    Dg Chest 2 View 08/24/2013    No active cardiopulmonary disease.   Ct Head Wo Contrast 08/23/2013    No acute intracranial abnormality.  Stable arachnoid cyst in right middle cranial fossa.     Mr Jodene Nam Head Wo  Contrast 08/23/2013    Moderate chronic small vessel ischemic changes elsewhere.  No major vessel occlusion or correctable proximal stenosis. Moderate diffuse distal vessel atherosclerotic irregularity.      Mr Brain Wo Contrast 08/23/2013    Acute infarction within the right posterior limb internal capsule/ corona radiata. No swelling or hemorrhage.     Carotid Doppler  08/24/13 <39% stenosis bilat  2D Echocardiogram  08/24/13 Mild-mod LVH, EF 60-65%, no PFO, no definite cardiac source of emboli identified   EKG 08/24/13 Sinus rhythm rate 79 beats per minute. For complete results please see formal report.    Therapy Recommendations CIR  Physical Exam   Neurologic Examination:  General:  Mental Status:  Alert, oriented, thought content appropriate. Speech fluent without evidence of aphasia. Able to follow 3 step commands without difficulty.  Cranial Nerves:  II: Discs not visualized; Visual fields grossly normal, pupils equal, round, reactive to light and accommodation  III,IV, VI: ptosis not present, extra-ocular motions intact bilaterally  V,VII: L facial weakness, facial light touch sensation mildly diminished on L  VIII: hearing normal bilaterally  IX,X: gag reflex present  XI: bilateral shoulder shrug  XII: midline tongue extension without atrophy or fasciculations  Motor:  Right : Upper extremity 5/5 - Left: Upper extremity 1/5  Right Lower extremity 5/5 - Left Lower extremity 3/5 extension, 2/5 flexion, 0/5 dorsiflexion Tone and bulk:normal tone throughout; no atrophy noted  Sensory: Light touch decreased in left upper and lower extremities Deep Tendon Reflexes:  1+ all over  Plantars:  Right: downgoing Left: upgoing Cerebellar:  normal finger-to-nose, normal heel-to-shin test  Gait: not tested   ASSESSMENT Ms. [name] is a 63 y.o. female with prior hx of HTN, past smoking, and OSA presenting with left hemiparesis, numbness, and dysarthria. T-PA therapy was not  initiated as the patient's deficits were mild and improving. Head CT negative. MRI  - acute infarction within the right posterior limb internal capsule/ corona radiata. MRA - Moderate chronic small vessel ischemic changes elsewhere. Infarct believed to be thrombotic secondary small vessel disease. On no antithrombotics prior to admission. Now on aspirin 81 mg orally every day and [place] trial medication for secondary stroke prevention. Patient with resultant worsening of left hemiparesis. Stroke work up underway.  Stroke: Acute infarct R posterior internal capsule/corona radiata, likely secondary to small vessel ischemic disease  Enrolled in the [place] trial, will get ASA 81 mg daily plus Plavix or Placebo  Carotid US and 2D Echo unremarkable  LDL 106  HgbA1c 6.0, elevated. Educate  patient about lifestyle changes for diabetes prevention  Pt with sign of OSA, will need sleep study as outpatient  Hypertension   Holding home Losartan / hydrochlorothiazide 50/12.5 mg daily for permissive HTN  BP 98-136/72-84 in last 24 hours  Hyperlipidemia  LDL 106   continue atorvastatin 40 mg daily  Dispo: Therapy recs CIR, Rehab MD saw patient this morning, likely d/c to Rehab this afternoon  Hospital day # 3   SIGNED Delbert Phenix, MSN, ANP-C, CNRN, MSCS [facility] Stroke Team [phone]  I, the attending vascular neurologist, have personally obtained a history, examined the patient, evaluated laboratory data, individually viewed imaging studies, and formulated the assessment and plan of care.  I have made any additions or clarifications directly to the above note and agree with the findings and plan as currently documented.   Rosalin Hawking, MD PhD 08/26/2013 5:29 PM       To contact Stroke Continuity provider, please refer to [email or web address] After hours, contact General Neurology

## 2013-08-26 NOTE — Progress Notes (Signed)
Physical Therapy Treatment Patient Details Name: [name] MRN: [id number] DOB: 1950-06-18 Today's Date: 08/26/2013    History of Present Illness Patient is a 63 yo female admitted 08/23/13 with Lt-sided weakness and dysarthria.  MRI showed Acute infarction within the right posterior limb internal capsule/corona radiata.  Baseline NIHSS = 3, and NIHSS am of 7/19 was 7.   PMH: HTN, Lt TKA.    PT Comments    Patient tolerated session well, focused on pre-gait and gait training in addition to trunk control and LE ROM. Patient with improvements in functional ambulation with assist. Will continue with current POC.   Follow Up Recommendations  CIR     Equipment Recommendations  Rolling walker with 5" wheels;Wheelchair (measurements PT);Wheelchair cushion (measurements PT)    Recommendations for Other Services Rehab consult     Precautions / Restrictions Precautions Precautions: Fall Precaution Comments: Lt hemiparesis Restrictions Weight Bearing Restrictions: No    Mobility  Bed Mobility                  Transfers Overall transfer level: Needs assistance Equipment used: 2 person hand held assist Transfers: Sit to/from Stand;Stand Pivot Transfers Sit to Stand: +2 physical assistance;Mod assist Stand pivot transfers: +2 physical assistance;Mod assist       General transfer comment: assist for weight shift, to advance L LE, and verbal cues for technique and hand placement  Ambulation/Gait Ambulation/Gait assistance: Max assist;+2 physical assistance Ambulation Distance (Feet): 18 Feet (x2 with then additonal 8 steps with turns) Assistive device:  (gait belt, 3 musketeer approach) Gait Pattern/deviations: Step-to pattern;Decreased dorsiflexion - left;Decreased weight shift to right;Shuffle;Trunk flexed Gait velocity: decreased Gait velocity interpretation: <1.8 ft/sec, indicative of risk for recurrent falls General Gait Details: Gait re-training, multimodal cues with  NDT faciliation techniques for Hip positioning and weight shift for clearance and stride of LLE, cues for muscle setting and weight shifts for R stride. Assist for support.   Stairs            Wheelchair Mobility    Modified Rankin (Stroke Patients Only) Modified Rankin (Stroke Patients Only) Pre-Morbid Rankin Score: No symptoms Modified Rankin: Severe disability     Balance     Sitting balance-Leahy Scale: Fair       Standing balance-Leahy Scale: Poor                      Cognition Arousal/Alertness: Awake/alert Behavior During Therapy: WFL for tasks assessed/performed Overall Cognitive Status: Within Functional Limits for tasks assessed                      Exercises Other Exercises Other Exercises: NDT faciliation techniques for pre-gait with standing posture and sit to stand transfers Other Exercises: trunk control and faciliation activieis at edge of chair Other Exercises: LE ROM against gravity with assist    General Comments        Pertinent Vitals/Pain No pain, VSS    Home Living   Living Arrangements: Other relatives;Other (Comment) (pt's 81 year old granddaughter and her 2 y.o. live with pt.) Available Help at Discharge: Family;Available 24 hours/day     Entrance Stairs-Rails: Left;Right          Prior Function            PT Goals (current goals can now be found in the care plan section) Acute Rehab PT Goals Patient Stated Goal: To get better PT Goal Formulation: With patient Time For Goal Achievement:  09/07/13 Potential to Achieve Goals: Good Progress towards PT goals: Progressing toward goals    Frequency  Min 4X/week    PT Plan Current plan remains appropriate    Co-evaluation             End of Session Equipment Utilized During Treatment: Gait belt Activity Tolerance: Patient tolerated treatment well Patient left: in chair;with call bell/phone within reach;with family/visitor present     Time:  5638-7564 PT Time Calculation (min): 40 min  Charges:  $Gait Training: 23-37 mins $Therapeutic Activity: 8-22 mins                    G CodesDuncan Dull 2013-08-30, 4:50 PM Alben Deeds, [name]  [phone]

## 2013-08-26 NOTE — Discharge Summary (Signed)
Physician Discharge Summary  [name] DGL:[id number] DOB: June 03, 1950 DOA: 08/23/2013  PCP: Elyn Peers, MD  Admit date: 08/23/2013 Discharge date: 08/26/2013  Time spent: 30 minutes  Recommendations for Outpatient Follow-up:  1. Discharge to [facility] to continue aggressive post stroke care    Discharge Diagnoses:  Active Problems:   Normocytic anemia   HYPERTENSION, BENIGN ESSENTIAL   Acute CVA (cerebrovascular accident)   Chronic diastolic heart failure   Dyslipidemia   Borderline diabetes   Discharge Condition: stable  Diet recommendation: Heart Healthy,carbohydrate modified  Filed Weights   08/23/13 1254 08/23/13 1619  Weight: 245 lb (111.131 kg) 237 lb 10.5 oz (107.8 kg)    History of present illness:  63 y.o.F with a history of hypertension who presented to the ED with complaints of left-sided weakness. Patient began feeling weakness and numbness in her left upper and lower extremity. EMS was called and patient was noted to have decreased strength in the left upper and lower extremity. She did not have any facial drooping. Patient did have some speech changes.   In the emergency department, CT of the head was done which was negative for stroke. Neurology was consulted. Patient was in the window for TPA however since her symptoms were improving, TPA was not given.   Hospital Course:  Acute CVA  -MRI this admission revealed acute infarction within the right posterior limb internal capsule/corona radiata. No swelling or hemorrhage. -Patient with persistent dense hemiplegia on the left -PT/OT suggested CIR and she was accepted on 08/26/2048 -Speech has cleared for regular diet w/ thin liquid, and suggests ongoing therapy for dysarthria  -Patient is in a study per Neurology (see medication list)  Essential HTN  -BP currently well controlled - avoid new agents for now due to risk for relative hypotension  -Please note home Losaartan with  hydrochlorothiazide was discontinued and patient now on low-dose beta blocker  Diastolic CHF  -New diagnosis this admission -no ACE/ARB presently due to borderline low BP for clinical scenario - well compensated at this time  -Please note was on ARB with hydrochlorothiazide prior to admission  Borderline diabetes  -A1c= 6.1 - will need to be followed as an outpt   HLD  -Lipitor 40 mg daily which was a new medication initiated during this admission  Normocytic anemia  -Stable with hemoglobin of 11.4 on 08/24/2013   Procedures/Significant Events: 7/18 CT head without contrast No acute abnormality. Stable arachnoid cyst Rt middle cranial fossa.  7/18 MRA head without contrast; Acute infarction w/i Rt posterior limb internal capsule/ corona radiata. - Moderate chronic small vessel ischemic changes - Moderate diffuse distal vessel atherosclerotic irregularity.  7/19 CT head without contrast ;Right basal ganglia to right corona radiata infarct has evolved on  CT, with its extent as noted on the prior MRI. -No evidence of a new infarct. No intracranial hemorrhage.  7/19 echocardiogram  - Left ventricle: mild to moderate LVH. -LVEF= 60% to 65%.  -(grade 1 diastolic dysfunction). 7/19 carotid Doppler:1-39% internal carotid artery stenosis bilaterally. Vertebral arteries patent with antegrade flow.   Consultations: Neurology/Dr. Jim Like   Discharge Exam: Filed Vitals:   08/26/13 0828  BP:   Pulse:   Temp: 98.5 F (36.9 C)  Resp:      General: No acute respiratory distress  Lungs: Clear to auscultation bilaterally without wheezes or crackles  Cardiovascular: Regular rate and rhythm without murmur gallop or rub normal S1 and S2  Abdomen: Nontender, nondistended, soft, bowel  sounds positive, no rebound, no ascites, no appreciable mass  Extremities: No significant cyanosis, clubbing, or edema bilateral lower extremities Neurological: No facial drooping, noted with dense  hemiplegia with preserved sensation, motor strength at this time on the left appears to be 0-1/5 with patient using right arm to reposition left arm. No evidence of hemianopsia on exam  Discharge Instructions You were cared for by a hospitalist during your hospital stay. If you have any questions about your discharge medications or the care you received while you were in the hospital after you are discharged, you can call the unit and asked to speak with the hospitalist on call if the hospitalist that took care of you is not available. Once you are discharged, your primary care physician will handle any further medical issues. Please note that NO REFILLS for any discharge medications will be authorized once you are discharged, as it is imperative that you return to your primary care physician (or establish a relationship with a primary care physician if you do not have one) for your aftercare needs so that they can reassess your need for medications and monitor your lab values.      Discharge Instructions   Diet - low sodium heart healthy    Complete by:  As directed      Diet Carb Modified    Complete by:  As directed      Increase activity slowly    Complete by:  As directed   Continue PT/OT            Medication List    STOP taking these medications       losartan-hydrochlorothiazide 50-12.5 MG per tablet  Commonly known as:  HYZAAR      TAKE these medications       aspirin 81 MG EC tablet  Take 1 tablet (81 mg total) by mouth daily.     atorvastatin 40 MG tablet  Commonly known as:  LIPITOR  Take 1 tablet (40 mg total) by mouth daily at 6 PM.     metoprolol tartrate 12.5 mg Tabs tablet  Commonly known as:  LOPRESSOR  Take 0.5 tablets (12.5 mg total) by mouth 2 (two) times daily.     research study medication  Take 75 mg by mouth daily with breakfast.     senna-docusate 8.6-50 MG per tablet  Commonly known as:  Senokot-S  Take 1 tablet by mouth at bedtime as needed for  mild constipation.       No Known Allergies Follow-up Information   Follow up with Xu,Jindong, MD In 1 month. (Stroke Follow Up)    Specialty:  Neurology   Contact information:   [address] [place] [address] [phone]        The results of significant diagnostics from this hospitalization (including imaging, microbiology, ancillary and laboratory) are listed below for reference.    Significant Diagnostic Studies: Dg Chest 2 View  08/24/2013   CLINICAL DATA:  Acute cerebral infarction.  EXAM: CHEST  2 VIEW  COMPARISON:  04/21/2013  FINDINGS: The heart size and mediastinal contours are within normal limits. Both lungs are clear. No evidence of pleural effusion. Mild tortuosity of thoracic aorta is stable. Thoracic spine degenerative changes again noted.  IMPRESSION: No active cardiopulmonary disease.   Electronically Signed   By: Earle Gell M.D.   On: 08/24/2013 08:02   Ct Head Wo Contrast  08/24/2013   CLINICAL DATA:  Recent stroke.  Worsening neuro deficits.  EXAM: CT HEAD WITHOUT CONTRAST  TECHNIQUE: Contiguous axial images were obtained from the base of the skull through the vertex without intravenous contrast.  COMPARISON:  MRI and CT, 08/23/2013  FINDINGS: Focal hypoattenuation extending superiorly from the right base a ganglia has a vault consistent with a subacute infarct.  Patchy hypoattenuation elsewhere in the white matter is stable consistent with chronic microvascular ischemic change. No cortical infarct.  No parenchymal masses or mass effect. Ventricles are normal in size and configuration.  Right middle cranial fossa arachnoid cyst, stable. No other extra-axial abnormalities.  No intracranial hemorrhage.  Visualized sinuses and mastoid air cells are clear. No skull lesion.  IMPRESSION: Right basal ganglia to right corona radiata infarct has evolved on CT, with its extent similar 12 was noted on the prior MRI. There is no evidence of a new infarct. No  intracranial hemorrhage.   Electronically Signed   By: Lajean Manes M.D.   On: 08/24/2013 10:31   Ct Head Wo Contrast  08/23/2013   CLINICAL DATA:  Acute stroke. Left-sided paralysis in weakness. Hypertension.  EXAM: CT HEAD WITHOUT CONTRAST  TECHNIQUE: Contiguous axial images were obtained from the base of the skull through the vertex without intravenous contrast.  COMPARISON:  06/03/2007  FINDINGS: There is no evidence of intracranial hemorrhage, brain edema, or other signs of acute infarction.  Arachnoid cyst is again seen in the right middle cranial fossa with mass effect on the right temporal horn, which is stable. No evidence of midline shift. No other abnormal extra-axial fluid collections are seen. Ventricles are normal in size. No skull abnormality identified.  IMPRESSION: No acute intracranial abnormality.  Stable arachnoid cyst in right middle cranial fossa.  These results will be called to the ordering clinician or representative by the Radiologist Assistant, and communication documented in the PACS or zVision Dashboard.   Electronically Signed   By: Earle Gell M.D.   On: 08/23/2013 13:16   Mr Jodene Nam Head Wo Contrast  08/23/2013   CLINICAL DATA:  Stroke.  Hypertension.  Left-sided weakness.  EXAM: MRI HEAD WITHOUT CONTRAST  MRA HEAD WITHOUT CONTRAST  TECHNIQUE: Multiplanar, multiecho pulse sequences of the brain and surrounding structures were obtained without intravenous contrast. Angiographic images of the head were obtained using MRA technique without contrast.  COMPARISON:  Head CT same day and 06/03/2007  FINDINGS: MRI HEAD FINDINGS  Diffusion imaging shows acute infarction in the posterior limb internal capsule/corona radiata on the right. No other acute infarction.  The brainstem and cerebellum appear normal. The cerebral hemispheres show moderate chronic small vessel ischemic changes within the white matter. Chronic benign arachnoid cyst at the anterior middle cranial fossa on the right is  unchanged. No hydrocephalus. No pituitary mass. No inflammatory sinus disease.  MRA HEAD FINDINGS  Both internal carotid arteries are patent into the brain. No siphon stenosis. The anterior and middle cerebral vessels are patent bilaterally. There is some atherosclerotic narrowing and irregularity without a correctable dominant stenosis.  Both vertebral arteries are patent to the basilar. No basilar stenosis. Posterior circulation branch vessels are patent, but also show diffuse atherosclerotic irregularity.  IMPRESSION: Acute infarction within the right posterior limb internal capsule/ corona radiata. No swelling or hemorrhage.  Moderate chronic small vessel ischemic changes elsewhere.  No major vessel occlusion or correctable proximal stenosis. Moderate diffuse distal vessel atherosclerotic irregularity.   Electronically Signed   By: Nelson Chimes M.D.   On: 08/23/2013 15:30   Mr Brain Wo Contrast  08/23/2013  CLINICAL DATA:  Stroke.  Hypertension.  Left-sided weakness.  EXAM: MRI HEAD WITHOUT CONTRAST  MRA HEAD WITHOUT CONTRAST  TECHNIQUE: Multiplanar, multiecho pulse sequences of the brain and surrounding structures were obtained without intravenous contrast. Angiographic images of the head were obtained using MRA technique without contrast.  COMPARISON:  Head CT same day and 06/03/2007  FINDINGS: MRI HEAD FINDINGS  Diffusion imaging shows acute infarction in the posterior limb internal capsule/corona radiata on the right. No other acute infarction.  The brainstem and cerebellum appear normal. The cerebral hemispheres show moderate chronic small vessel ischemic changes within the white matter. Chronic benign arachnoid cyst at the anterior middle cranial fossa on the right is unchanged. No hydrocephalus. No pituitary mass. No inflammatory sinus disease.  MRA HEAD FINDINGS  Both internal carotid arteries are patent into the brain. No siphon stenosis. The anterior and middle cerebral vessels are patent  bilaterally. There is some atherosclerotic narrowing and irregularity without a correctable dominant stenosis.  Both vertebral arteries are patent to the basilar. No basilar stenosis. Posterior circulation branch vessels are patent, but also show diffuse atherosclerotic irregularity.  IMPRESSION: Acute infarction within the right posterior limb internal capsule/ corona radiata. No swelling or hemorrhage.  Moderate chronic small vessel ischemic changes elsewhere.  No major vessel occlusion or correctable proximal stenosis. Moderate diffuse distal vessel atherosclerotic irregularity.   Electronically Signed   By: Nelson Chimes M.D.   On: 08/23/2013 15:30    Microbiology: Recent Results (from the past 240 hour(s))  MRSA PCR SCREENING     Status: None   Collection Time    08/23/13  5:48 PM      Result Value Ref Range Status   MRSA by PCR NEGATIVE  NEGATIVE Final   Comment:            The GeneXpert MRSA Assay (FDA     approved for NASAL specimens     only), is one component of a     comprehensive MRSA colonization     surveillance program. It is not     intended to diagnose MRSA     infection nor to guide or     monitor treatment for     MRSA infections.     Labs: Basic Metabolic Panel:  Recent Labs Lab 08/23/13 1250 08/23/13 1257  NA 143 142  K 3.7 3.6*  CL 104 105  CO2 25  --   GLUCOSE 118* 120*  BUN 10 9  CREATININE 0.64 0.70  CALCIUM 8.6  --    Liver Function Tests:  Recent Labs Lab 08/23/13 1250  AST 14  ALT 21  ALKPHOS 63  BILITOT 0.4  PROT 7.2  ALBUMIN 3.4*   No results found for this basename: LIPASE, AMYLASE,  in the last 168 hours No results found for this basename: AMMONIA,  in the last 168 hours CBC:  Recent Labs Lab 08/23/13 1250 08/23/13 1257  WBC 4.8  --   NEUTROABS 1.4*  --   HGB 11.4* 12.6  HCT 35.3* 37.0  MCV 87.8  --   PLT 213  --    Cardiac Enzymes: No results found for this basename: CKTOTAL, CKMB, CKMBINDEX, TROPONINI,  in the last 168  hours BNP: BNP (last 3 results)  Recent Labs  04/21/13 1940  PROBNP <5.0   CBG:  Recent Labs Lab 08/23/13 1240 08/25/13 1951  GLUCAP 123* 127*       Signed:  ELLIS,ALLISON L. ANP [facility] 08/26/2013, 1:46 PM  Examining  patient, and discussed assessment and plan with ANP Ebony Hail Patient with multiple complex medical problems> 35 minutes used in direct patient care Discuss plan of care with patient's daughters.

## 2013-08-26 NOTE — H&P (View-Only) (Signed)
Physical Medicine and Rehabilitation Admission H&P    Chief Complaint  Patient presents with  . Code Stroke  :  Chief complaint: Left-sided weakness  HPI: [name] is a 63 y.o. right handed female with history of hypertension. Patient lives alone and was independent prior to admission. Admitted 08/23/2013 with left-sided weakness of acute onset after returning home from work as well as slurred speech. MRI showed acute infarction within the right posterior limb internal capsule/corona radiata. MRA of the head with no major occlusion or stenosis. Echocardiogram with ejection fraction of 68% grade 1 diastolic dysfunction. Carotid Dopplers with no ICA stenosis. Patient did not receive TPA. Neurology consulted placed on aspirin for CVA prophylaxis as well as enrollment in the Point trial study. She is tolerating a regular consistency diet. Physical and occupational therapy evaluation completed 08/25/2013 with recommendations of physical medicine rehabilitation consult. Patient was admitted for comprehensive rehabilitation program   ROS Review of Systems  Eyes: Positive for pain.  Musculoskeletal: Positive for joint pain and myalgias.  Neurological: Positive for dizziness and headaches.  All other systems reviewed and are negative  Past Medical History  Diagnosis Date  . Hypertension   . Arthritis    Past Surgical History  Procedure Laterality Date  . Joint replacement    . Replacement total knee Left    No family history on file. Social History:  reports that she has never smoked. She does not have any smokeless tobacco history on file. She reports that she does not drink alcohol or use illicit drugs. Allergies: No Known Allergies Medications Prior to Admission  Medication Sig Dispense Refill  . losartan-hydrochlorothiazide (HYZAAR) 50-12.5 MG per tablet Take 1 tablet by mouth daily.        Home: Home Living Family/patient expects to be discharged to:: Private  residence Living Arrangements: Alone Available Help at Discharge: Family;Available PRN/intermittently Type of Home: House Home Access: Stairs to enter [employer] of Steps: 2 Entrance Stairs-Rails: None Home Layout: One level Home Equipment: None   Functional History: Prior Function Level of Independence: Independent  Functional Status:  Mobility: Bed Mobility Overal bed mobility: Needs Assistance Bed Mobility: Supine to Sit Rolling: Mod assist;Min assist (Min to left; Mod to right) Sidelying to sit: Mod assist Supine to sit: +2 for physical assistance;Mod assist Sit to supine: Mod assist General bed mobility comments: Verbal cues for technique, physical assist for L LE and trunk. Transfers Overall transfer level: Needs assistance Transfers: Sit to/from Stand Sit to Stand: +2 physical assistance;Mod assist Stand pivot transfers: +2 physical assistance;Mod assist General transfer comment: assist for weight shift, to advance L LE, and verbal cues for technique and hand placement Ambulation/Gait Ambulation/Gait assistance: Max assist;+2 physical assistance Ambulation Distance (Feet): 8 Feet (X2, with static pre gait weight shifts) Assistive device:  (gait belt, 3 musketeer approach) Gait Pattern/deviations: Step-to pattern;Decreased dorsiflexion - left;Decreased weight shift to right;Shuffle;Trunk flexed Gait velocity: decreased Gait velocity interpretation: <1.8 ft/sec, indicative of risk for recurrent falls General Gait Details: gait belt, 3 musketeer approach, VCs for upright posture and weight shifting, manual advancement of LLE required and VCs for quad setting.  Manual knee block LLE when fatigued    ADL: ADL Overall ADL's : Needs assistance/impaired Eating/Feeding: Minimal assistance;Sitting Eating/Feeding Details (indicate cue type and reason): noted coughing with drinking, RN notified, ST consult pending Grooming: Wash/dry hands;Wash/dry face;Moderate  assistance;Sitting Upper Body Bathing: Moderate assistance;Sitting Lower Body Bathing: Total assistance;+2 for physical assistance;Sit to/from stand Upper Body Dressing : Moderate  assistance;Sitting Lower Body Dressing: Total assistance;+2 for physical assistance;Sit to/from stand Toilet Transfer: +2 for physical assistance;Stand-pivot;BSC;Moderate assistance Toileting- Clothing Manipulation and Hygiene: +2 for physical assistance;Total assistance;Sit to/from stand Functional mobility during ADLs: +2 for physical assistance;Moderate assistance  Cognition: Cognition Overall Cognitive Status: Within Functional Limits for tasks assessed Orientation Level: Oriented X4 Cognition Arousal/Alertness: Awake/alert Behavior During Therapy: WFL for tasks assessed/performed Overall Cognitive Status: Within Functional Limits for tasks assessed  Physical Exam: Blood pressure 121/65, pulse 71, temperature 98.5 F (36.9 C), temperature source Oral, resp. rate 11, height 5' 3.6" (1.615 m), weight 107.8 kg (237 lb 10.5 oz), SpO2 97.00%. Physical Exam Gen: no acute distress HENT: oral mucosa pink, dentition fair Left Facial droop  Eyes: EOM are normal.  Neck: Normal range of motion. Neck supple. No thyromegaly present.  Cardiovascular: Normal rate and regular rhythm. No murmurs, rubs Respiratory: Effort normal and breath sounds normal. No respiratory distress. No wheezes GI: Soft. Bowel sounds are normal. She exhibits no distension.  Neurological: She is alert.  Speech is dysarthric but intelligible. She is oriented to person, place and date of birth. She did follow simple commands. Left central 7 and tongue deviation. Sensation 1/2 LUE and LLE. LUE: 0/5 prox to distal, only shoulder shrug. LLE: 1/5 HF, 1+ KE, 0/5 ADP/APF. RUE grossly  4/5---appears to have functional use. RLE 3/5 prox to 4/5 distally. DTR's 1+ throughout.  Has reasonable insight and awareness, memory.  Skin: Skin is warm and dry Psych:  pleasant and appropriate, cooperative  Results for orders placed during the hospital encounter of 08/23/13 (from the past 48 hour(s))  GLUCOSE, CAPILLARY     Status: Abnormal   Collection Time    08/25/13  7:51 PM      Result Value Ref Range   Glucose-Capillary 127 (*) 70 - 99 mg/dL   Ct Head Wo Contrast  08/24/2013   CLINICAL DATA:  Recent stroke.  Worsening neuro deficits.  EXAM: CT HEAD WITHOUT CONTRAST  TECHNIQUE: Contiguous axial images were obtained from the base of the skull through the vertex without intravenous contrast.  COMPARISON:  MRI and CT, 08/23/2013  FINDINGS: Focal hypoattenuation extending superiorly from the right base a ganglia has a vault consistent with a subacute infarct.  Patchy hypoattenuation elsewhere in the white matter is stable consistent with chronic microvascular ischemic change. No cortical infarct.  No parenchymal masses or mass effect. Ventricles are normal in size and configuration.  Right middle cranial fossa arachnoid cyst, stable. No other extra-axial abnormalities.  No intracranial hemorrhage.  Visualized sinuses and mastoid air cells are clear. No skull lesion.  IMPRESSION: Right basal ganglia to right corona radiata infarct has evolved on CT, with its extent similar 12 was noted on the prior MRI. There is no evidence of a new infarct. No intracranial hemorrhage.   Electronically Signed   By: Lajean Manes M.D.   On: 08/24/2013 10:31       Medical Problem List and Plan: 1. Functional deficits secondary to right PLIC infarcts secondary to small vessel disease 2.  DVT Prophylaxis/Anticoagulation: SCDs. Monitor for any signs of DVT 3. Pain Management: Tylenol as needed 4. Hypertension. Lopressor 12.5 mg twice a day. Monitor with increased mobility 5. Neuropsych: This patient is capable of making decisions on her own behalf. 6. Hyperlipidemia. Lipitor     Post Admission Physician Evaluation: 1. Functional deficits secondary  to right PLIC infarct  . 2. Patient is admitted to receive collaborative, interdisciplinary care between the physiatrist, rehab nursing staff,  and therapy team. 3. Patient's level of medical complexity and substantial therapy needs in context of that medical necessity cannot be provided at a lesser intensity of care such as a SNF. 4. Patient has experienced substantial functional loss from his/her baseline which was documented above under the "Functional History" and "Functional Status" headings.  Judging by the patient's diagnosis, physical exam, and functional history, the patient has potential for functional progress which will result in measurable gains while on inpatient rehab.  These gains will be of substantial and practical use upon discharge  in facilitating mobility and self-care at the household level. 5. Physiatrist will provide 24 hour management of medical needs as well as oversight of the therapy plan/treatment and provide guidance as appropriate regarding the interaction of the two. 6. 24 hour rehab nursing will assist with bladder management, bowel management, safety, skin/wound care, disease management, medication administration, pain management and patient education  and help integrate therapy concepts, techniques,education, etc. 7. PT will assess and treat for/with: Lower extremity strength, range of motion, stamina, balance, functional mobility, safety, adaptive techniques and equipment, NMR, family/caregiver education, orthotics as appropriate, stroke education.   Goals are: supervision to min assist. 8. OT will assess and treat for/with: ADL's, functional mobility, safety, upper extremity strength, adaptive techniques and equipment, NMR, stroke education, community reintegration, care giver ed. Leisure activities.   Goals are: supervision to min assist. 9. SLP will assess and treat for/with: speech, swallowing, communication.  Goals are: mod I. 10. Case Management and Social Worker will assess and treat for  psychological issues and discharge planning. 11. Team conference will be held weekly to assess progress toward goals and to determine barriers to discharge. 12. Patient will receive at least 3 hours of therapy per day at least 5 days per week. 13. ELOS: 20-25 days       14. Prognosis:  excellent     Meredith Staggers, MD, [facility]   08/26/2013

## 2013-08-26 NOTE — Progress Notes (Signed)
Patient arrived at 2 from [place]. Oriented patient to room, surroundings and rehab culture. Discussed with patient safety plan and agreement, patient verbalized understanding. Cont plan of care. Bevan Vu, Dione Plover

## 2013-08-26 NOTE — Progress Notes (Signed)
Meredith Staggers, MD Physician Signed Physical Medicine and Rehabilitation Consult Note Service date: 08/25/2013 2:42 PM  Related encounter: Admission (Current) from 08/23/2013 in [facility] 3S STEPDOWN           Physical Medicine and Rehabilitation Consult Reason for Consult: CVA Referring Physician: [facility]     HPI: [name] is a 63 y.o. right handed female with history of hypertension. Patient lives alone and was independent prior to admission. Admitted 08/23/2013 with left-sided weakness of acute onset after returning home from as well as slurred speech. MRI showed acute infarction within the right posterior limb internal capsule/corona radiata. MRA of the head with no major occlusion or stenosis. Echocardiogram with ejection fraction of 76% grade 1 diastolic dysfunction. Carotid Dopplers with no ICA stenosis. Patient did not receive TPA. Neurology consulted placed on aspirin for CVA prophylaxis. She is tolerating a regular consistency diet. Physical and occupational therapy evaluation completed 08/25/2013 with recommendations of physical medicine rehabilitation consult.     Review of Systems  Eyes: Positive for pain.  Musculoskeletal: Positive for joint pain and myalgias.  Neurological: Positive for dizziness and headaches.  All other systems reviewed and are negative. Past Medical History   Diagnosis  Date   .  Hypertension     .  Arthritis      Past Surgical History   Procedure  Laterality  Date   .  Joint replacement       .  Replacement total knee  Left      No family history on file. Social History: reports that she has never smoked. She does not have any smokeless tobacco history on file. She reports that she does not drink alcohol or use illicit drugs. Allergies: No Known Allergies Medications Prior to Admission   Medication  Sig  Dispense  Refill   .  losartan-hydrochlorothiazide (HYZAAR) 50-12.5 MG per tablet  Take 1 tablet by mouth daily.             Home: Home Living Family/patient expects to be discharged to:: Private residence Living Arrangements: Alone Available Help at Discharge: Family;Available PRN/intermittently Type of Home: House Home Access: Stairs to enter [employer] of Steps: 2 Entrance Stairs-Rails: None Home Layout: One level Home Equipment: None   Functional History: Prior Function Level of Independence: Independent Functional Status:   Mobility: Bed Mobility Overal bed mobility: Needs Assistance Bed Mobility: Supine to Sit Rolling: Mod assist;Min assist (Min to left; Mod to right) Sidelying to sit: Mod assist Supine to sit: +2 for physical assistance;Mod assist Sit to supine: Mod assist General bed mobility comments: Verbal cues for technique, physical assist for L LE and trunk. Transfers Overall transfer level: Needs assistance Transfers: Sit to/from Stand Sit to Stand: +2 physical assistance;Mod assist Stand pivot transfers: +2 physical assistance;Mod assist General transfer comment: assist for weight shift, to advance L LE, and verbal cues for technique and hand placement Ambulation/Gait Ambulation/Gait assistance: Max assist;+2 physical assistance Ambulation Distance (Feet): 8 Feet (X2, with static pre gait weight shifts) Assistive device:  (gait belt, 3 musketeer approach) Gait Pattern/deviations: Step-to pattern;Decreased dorsiflexion - left;Decreased weight shift to right;Shuffle;Trunk flexed Gait velocity: decreased Gait velocity interpretation: <1.8 ft/sec, indicative of risk for recurrent falls General Gait Details: gait belt, 3 musketeer approach, VCs for upright posture and weight shifting, manual advancement of LLE required and VCs for quad setting.  Manual knee block LLE when fatigued   ADL: ADL Overall ADL's : Needs assistance/impaired Eating/Feeding: Minimal assistance;Sitting Eating/Feeding  Details (indicate cue type and reason): noted coughing with drinking,  RN notified, ST consult pending Grooming: Wash/dry hands;Wash/dry face;Moderate assistance;Sitting Upper Body Bathing: Moderate assistance;Sitting Lower Body Bathing: Total assistance;+2 for physical assistance;Sit to/from stand Upper Body Dressing : Moderate assistance;Sitting Lower Body Dressing: Total assistance;+2 for physical assistance;Sit to/from stand Toilet Transfer: +2 for physical assistance;Stand-pivot;BSC;Moderate assistance Toileting- Clothing Manipulation and Hygiene: +2 for physical assistance;Total assistance;Sit to/from stand Functional mobility during ADLs: +2 for physical assistance;Moderate assistance   Cognition: Cognition Overall Cognitive Status: Within Functional Limits for tasks assessed Orientation Level: Oriented X4 Cognition Arousal/Alertness: Awake/alert Behavior During Therapy: WFL for tasks assessed/performed Overall Cognitive Status: Within Functional Limits for tasks assessed   Blood pressure 117/72, pulse 77, temperature 98.3 F (36.8 C), temperature source Oral, resp. rate 13, height 5' 3.6" (1.615 m), weight 107.8 kg (237 lb 10.5 oz), SpO2 93.00%. Physical Exam  HENT:  Left Facial droop  Eyes: EOM are normal.  Neck: Normal range of motion. Neck supple. No thyromegaly present.  Cardiovascular: Normal rate and regular rhythm.   Respiratory: Effort normal and breath sounds normal. No respiratory distress.  GI: Soft. Bowel sounds are normal. She exhibits no distension.  Neurological: She is alert.  Speech is dysarthric but intelligible. She is oriented to person, place and date of birth. She did follow simple commands. Left central 7 and tongue deviation. Sensation 1/2 LUE and LLE. LUE: 0/5 prox to distal, only shoulder shrug. LLE: 1/5 HF, 1+ KE, 0/5 ADP/APF. Has reasonable insight and awareness, memory.   Skin: Skin is warm and dry.    No results found for this or any previous visit (from the past 24 hour(s)). Dg Chest 2 View   08/24/2013    CLINICAL DATA:  Acute cerebral infarction.  EXAM: CHEST  2 VIEW  COMPARISON:  04/21/2013  FINDINGS: The heart size and mediastinal contours are within normal limits. Both lungs are clear. No evidence of pleural effusion. Mild tortuosity of thoracic aorta is stable. Thoracic spine degenerative changes again noted.  IMPRESSION: No active cardiopulmonary disease.   Electronically Signed   By: Earle Gell M.D.   On: 08/24/2013 08:02    Ct Head Wo Contrast   08/24/2013   CLINICAL DATA:  Recent stroke.  Worsening neuro deficits.  EXAM: CT HEAD WITHOUT CONTRAST  TECHNIQUE: Contiguous axial images were obtained from the base of the skull through the vertex without intravenous contrast.  COMPARISON:  MRI and CT, 08/23/2013  FINDINGS: Focal hypoattenuation extending superiorly from the right base a ganglia has a vault consistent with a subacute infarct.  Patchy hypoattenuation elsewhere in the white matter is stable consistent with chronic microvascular ischemic change. No cortical infarct.  No parenchymal masses or mass effect. Ventricles are normal in size and configuration.  Right middle cranial fossa arachnoid cyst, stable. No other extra-axial abnormalities.  No intracranial hemorrhage.  Visualized sinuses and mastoid air cells are clear. No skull lesion.  IMPRESSION: Right basal ganglia to right corona radiata infarct has evolved on CT, with its extent similar 12 was noted on the prior MRI. There is no evidence of a new infarct. No intracranial hemorrhage.   Electronically Signed   By: Lajean Manes M.D.   On: 08/24/2013 10:31    Mr Jodene Nam Head Wo Contrast   08/23/2013   CLINICAL DATA:  Stroke.  Hypertension.  Left-sided weakness.  EXAM: MRI HEAD WITHOUT CONTRAST  MRA HEAD WITHOUT CONTRAST  TECHNIQUE: Multiplanar, multiecho pulse sequences of the brain and surrounding structures were obtained  without intravenous contrast. Angiographic images of the head were obtained using MRA technique without contrast.   COMPARISON:  Head CT same day and 06/03/2007  FINDINGS: MRI HEAD FINDINGS  Diffusion imaging shows acute infarction in the posterior limb internal capsule/corona radiata on the right. No other acute infarction.  The brainstem and cerebellum appear normal. The cerebral hemispheres show moderate chronic small vessel ischemic changes within the white matter. Chronic benign arachnoid cyst at the anterior middle cranial fossa on the right is unchanged. No hydrocephalus. No pituitary mass. No inflammatory sinus disease.  MRA HEAD FINDINGS  Both internal carotid arteries are patent into the brain. No siphon stenosis. The anterior and middle cerebral vessels are patent bilaterally. There is some atherosclerotic narrowing and irregularity without a correctable dominant stenosis.  Both vertebral arteries are patent to the basilar. No basilar stenosis. Posterior circulation branch vessels are patent, but also show diffuse atherosclerotic irregularity.  IMPRESSION: Acute infarction within the right posterior limb internal capsule/ corona radiata. No swelling or hemorrhage.  Moderate chronic small vessel ischemic changes elsewhere.  No major vessel occlusion or correctable proximal stenosis. Moderate diffuse distal vessel atherosclerotic irregularity.   Electronically Signed   By: Nelson Chimes M.D.   On: 08/23/2013 15:30    Mr Brain Wo Contrast   08/23/2013   CLINICAL DATA:  Stroke.  Hypertension.  Left-sided weakness.  EXAM: MRI HEAD WITHOUT CONTRAST  MRA HEAD WITHOUT CONTRAST  TECHNIQUE: Multiplanar, multiecho pulse sequences of the brain and surrounding structures were obtained without intravenous contrast. Angiographic images of the head were obtained using MRA technique without contrast.  COMPARISON:  Head CT same day and 06/03/2007  FINDINGS: MRI HEAD FINDINGS  Diffusion imaging shows acute infarction in the posterior limb internal capsule/corona radiata on the right. No other acute infarction.  The brainstem and  cerebellum appear normal. The cerebral hemispheres show moderate chronic small vessel ischemic changes within the white matter. Chronic benign arachnoid cyst at the anterior middle cranial fossa on the right is unchanged. No hydrocephalus. No pituitary mass. No inflammatory sinus disease.  MRA HEAD FINDINGS  Both internal carotid arteries are patent into the brain. No siphon stenosis. The anterior and middle cerebral vessels are patent bilaterally. There is some atherosclerotic narrowing and irregularity without a correctable dominant stenosis.  Both vertebral arteries are patent to the basilar. No basilar stenosis. Posterior circulation branch vessels are patent, but also show diffuse atherosclerotic irregularity.  IMPRESSION: Acute infarction within the right posterior limb internal capsule/ corona radiata. No swelling or hemorrhage.  Moderate chronic small vessel ischemic changes elsewhere.  No major vessel occlusion or correctable proximal stenosis. Moderate diffuse distal vessel atherosclerotic irregularity.   Electronically Signed   By: Nelson Chimes M.D.   On: 08/23/2013 15:30     Assessment/Plan: Diagnosis: Right PLIC infarct with dense left hemiparesis Does the need for close, 24 hr/day medical supervision in concert with the patient's rehab needs make it unreasonable for this patient to be served in a less intensive setting? Yes Co-Morbidities requiring supervision/potential complications: anemia, chronic pain Due to bladder management, bowel management, safety, skin/wound care, disease management, medication administration, pain management and patient education, does the patient require 24 hr/day rehab nursing? Yes Does the patient require coordinated care of a physician, rehab nurse, PT (1-2 hrs/day, 5 days/week), OT (1-2 hrs/day, 5 days/week) and SLP (1-2 hrs/day, 5 days/week) to address physical and functional deficits in the context of the above medical diagnosis(es)? Yes Addressing deficits  in the following areas: balance,  endurance, locomotion, strength, transferring, bowel/bladder control, bathing, dressing, feeding, grooming, toileting, speech, swallowing and psychosocial support Can the patient actively participate in an intensive therapy program of at least 3 hrs of therapy per day at least 5 days per week? Yes The potential for patient to make measurable gains while on inpatient rehab is excellent Anticipated functional outcomes upon discharge from inpatient rehab are supervision and min assist  with PT, supervision and min assist with OT, modified independent and supervision with SLP. Estimated rehab length of stay to reach the above functional goals is: 20-25 days Does the patient have adequate social supports to accommodate these discharge functional goals? Yes Anticipated D/C setting: Home Anticipated post D/C treatments: HH therapy and Outpatient therapy Overall Rehab/Functional Prognosis: excellent   RECOMMENDATIONS: This patient's condition is appropriate for continued rehabilitative care in the following setting: CIR Patient has agreed to participate in recommended program. Yes Note that insurance prior authorization may be required for reimbursement for recommended care.   Comment: Rehab Admissions Coordinator to follow up.   Thanks,   Meredith Staggers, MD, Mellody Drown         08/25/2013    Revision History...     Date/Time User Action   08/26/2013 8:59 AM Meredith Staggers, MD Sign   08/25/2013 2:55 PM Cathlyn Parsons, PA-C Pend  View Details Report   Routing History...     Date/Time From To Method   08/26/2013 8:59 AM Meredith Staggers, MD Meredith Staggers, MD In Basket   08/26/2013 8:59 AM Meredith Staggers, MD [name], MD Fax

## 2013-08-26 NOTE — Progress Notes (Signed)
Report given to [facility], RN, all questions answered.  Patient aware of transfer.  No complaints

## 2013-08-26 NOTE — Progress Notes (Signed)
Inpatient Rehabilitation  I met with [name] and her daughter Aniceto Boss at the bedside to discuss her post acute rehab options.  Pt. Indicates she prefers to come to IP Rehab here at [facility] and her daughter is in agreement.  I provided pt. and family informational booklets and answered their questions.  I discussed with Dr. Erlinda Hong who says pt. is ready from his perspective.  I await a return call from Dr. Sherral Hammers for his clearance .  I have a bed available and can admit today as long as OK with Dr. Sherral Hammers. Will provide update when I hear from Dr. Sherral Hammers.  Please call if questions.  [place] Admissions Coordinator Cell [phone] Office [phone]

## 2013-08-26 NOTE — Progress Notes (Signed)
Inpatient Rehabilitation  I spoke with Erin Hearing ,NP who gives clearance for Ms. Rowe to admit to IP rehab today.  I will arrange this admission.  Please call if questions.  [place] Admissions Coordinator Cell [phone] Office [phone]

## 2013-08-27 ENCOUNTER — Inpatient Hospital Stay (HOSPITAL_COMMUNITY): Payer: Medicare Other | Admitting: Occupational Therapy

## 2013-08-27 ENCOUNTER — Inpatient Hospital Stay (HOSPITAL_COMMUNITY): Payer: Medicare Other | Admitting: Speech Pathology

## 2013-08-27 ENCOUNTER — Inpatient Hospital Stay (HOSPITAL_COMMUNITY): Payer: Medicare Other | Admitting: Physical Therapy

## 2013-08-27 DIAGNOSIS — I10 Essential (primary) hypertension: Secondary | ICD-10-CM

## 2013-08-27 DIAGNOSIS — I6330 Cerebral infarction due to thrombosis of unspecified cerebral artery: Secondary | ICD-10-CM

## 2013-08-27 DIAGNOSIS — Z5189 Encounter for other specified aftercare: Secondary | ICD-10-CM

## 2013-08-27 LAB — CBC WITH DIFFERENTIAL/PLATELET
Basophils Absolute: 0 10*3/uL (ref 0.0–0.1)
Basophils Relative: 0 % (ref 0–1)
Eosinophils Absolute: 0.3 10*3/uL (ref 0.0–0.7)
Eosinophils Relative: 6 % — ABNORMAL HIGH (ref 0–5)
HCT: 38.3 % (ref 36.0–46.0)
HEMOGLOBIN: 12.3 g/dL (ref 12.0–15.0)
LYMPHS ABS: 2.7 10*3/uL (ref 0.7–4.0)
LYMPHS PCT: 53 % — AB (ref 12–46)
MCH: 28.9 pg (ref 26.0–34.0)
MCHC: 32.1 g/dL (ref 30.0–36.0)
MCV: 90.1 fL (ref 78.0–100.0)
Monocytes Absolute: 0.6 10*3/uL (ref 0.1–1.0)
Monocytes Relative: 11 % (ref 3–12)
NEUTROS PCT: 30 % — AB (ref 43–77)
Neutro Abs: 1.6 10*3/uL — ABNORMAL LOW (ref 1.7–7.7)
PLATELETS: 220 10*3/uL (ref 150–400)
RBC: 4.25 MIL/uL (ref 3.87–5.11)
RDW: 14.6 % (ref 11.5–15.5)
WBC: 5.2 10*3/uL (ref 4.0–10.5)

## 2013-08-27 LAB — COMPREHENSIVE METABOLIC PANEL
ALK PHOS: 67 U/L (ref 39–117)
ALT: 23 U/L (ref 0–35)
AST: 14 U/L (ref 0–37)
Albumin: 3.4 g/dL — ABNORMAL LOW (ref 3.5–5.2)
Anion gap: 11 (ref 5–15)
BUN: 15 mg/dL (ref 6–23)
CO2: 28 meq/L (ref 19–32)
Calcium: 8.9 mg/dL (ref 8.4–10.5)
Chloride: 103 mEq/L (ref 96–112)
Creatinine, Ser: 0.74 mg/dL (ref 0.50–1.10)
GFR, EST NON AFRICAN AMERICAN: 89 mL/min — AB (ref >90)
GLUCOSE: 108 mg/dL — AB (ref 70–99)
POTASSIUM: 4.3 meq/L (ref 3.7–5.3)
Sodium: 142 mEq/L (ref 137–147)
Total Bilirubin: 0.4 mg/dL (ref 0.3–1.2)
Total Protein: 7.3 g/dL (ref 6.0–8.3)

## 2013-08-27 NOTE — Care Management Note (Signed)
Inpatient Rehabilitation Center Individual Statement of Services  Patient Name:  [name]  Date:  08/27/2013  Welcome to the [facility].  Our goal is to provide you with an individualized program based on your diagnosis and situation, designed to meet your specific needs.  With this comprehensive rehabilitation program, you will be expected to participate in at least 3 hours of rehabilitation therapies Monday-Friday, with modified therapy programming on the weekends.  Your rehabilitation program will include the following services:  Physical Therapy (PT), Occupational Therapy (OT), Speech Therapy (ST), 24 hour per day rehabilitation nursing, Therapeutic Recreaction (TR), Case Management (Social Worker), Rehabilitation Medicine, Nutrition Services and Pharmacy Services  Weekly team conferences will be held on Wednesday to discuss your progress.  Your Social Worker will talk with you frequently to get your input and to update you on team discussions.  Team conferences with you and your family in attendance may also be held.  Expected length of stay: 20-25 days  Overall anticipated outcome: supervision/min level  Depending on your progress and recovery, your program may change. Your Social Worker will coordinate services and will keep you informed of any changes. Your Social Worker's name and contact numbers are listed  below.  The following services may also be recommended but are not provided by the [facility] will be made to provide these services after discharge if needed.  Arrangements include referral to agencies that provide these services.  Your insurance has been verified to be:  Medicare Your primary doctor is:  Dr [name]  Pertinent information will be shared with your doctor and your insurance company.  Social Worker:   Ovidio Kin, [place] or ([phone]  Information discussed with and copy given to patient by: [name], 08/27/2013, 1:45 PM

## 2013-08-27 NOTE — Evaluation (Signed)
Physical Therapy Assessment and Plan  Patient Details  Name: [name] MRN: [id number] Date of Birth: [birth year]  PT Diagnosis: Abnormality of gait, Hemiplegia non-dominant, Impaired cognition, Impaired sensation and Muscle weakness Rehab Potential: Excellent ELOS: 21-25 days   Today's Date: 08/27/2013 Time:  1937-9024 Time Calculation (min): 58 min  Problem List:  Patient Active Problem List   Diagnosis Date Noted  . Chronic diastolic heart failure 09/73/5329  . Dyslipidemia 08/26/2013  . Borderline diabetes 08/26/2013  . CVA (cerebral infarction) 08/26/2013  . Acute CVA (cerebrovascular accident) 08/23/2013  . Pre-syncope 08/17/2012  . Hypokalemia 08/17/2012  . ARTHRITIS 03/28/2010  . DE QUERVAIN'S TENOSYNOVITIS 03/28/2010  . Normocytic anemia 07/26/2009  . REACTIVE AIRWAY DISEASE 03/09/2009  . DENTAL CARIES 03/09/2009  . CANDIDIASIS OF VULVA AND VAGINA 06/04/2008  . SKIN TAG 06/04/2008  . ARTHRITIS, KNEES, BILATERAL 05/22/2008  . HYPERTENSION, BENIGN ESSENTIAL 05/04/2008  . KNEE PAIN, BILATERAL 05/04/2008  . COUGH 05/04/2008  . MENISCUS TEAR, RIGHT 12/07/2005    Past Medical History:  Past Medical History  Diagnosis Date  . Hypertension   . Arthritis    Past Surgical History:  Past Surgical History  Procedure Laterality Date  . Joint replacement    . Replacement total knee Left     Assessment & Plan Clinical Impression: [name] is a 63 y.o. right handed female with history of hypertension. Patient lives alone and was independent prior to admission. Admitted 08/23/2013 with left-sided weakness of acute onset after returning home from work as well as slurred speech. MRI showed acute infarction within the right posterior limb internal capsule/corona radiata. MRA of the head with no major occlusion or stenosis. Echocardiogram with ejection fraction of 92% grade 1 diastolic dysfunction. Carotid Dopplers with no ICA stenosis. Patient did not receive TPA.  Neurology consulted placed on aspirin for CVA prophylaxis as well as enrollment in the Point trial study. She is tolerating a regular consistency diet. Patient transferred to CIR on 08/26/2013 .   Patient currently requires +2 total with mobility secondary to muscle weakness, impaired timing and sequencing, abnormal tone, unbalanced muscle activation and decreased motor planning, decreased memory and decreased sitting balance, decreased standing balance, decreased postural control, hemiplegia and decreased balance strategies.  Prior to hospitalization, patient was independent  with mobility and lived with Alone;Other (Comment) (69 year old granddaughter moved in ~1 month ago) in a House home.  Home access is 2-3 (per pt's daughter)Stairs to enter.  Patient will benefit from skilled PT intervention to maximize safe functional mobility and minimize fall risk for planned discharge home with 24 hour supervision and intermittent assist.  Anticipate patient will benefit from follow up [facility] at discharge.  PT - End of Session Activity Tolerance: Tolerates 30+ min activity with multiple rests Endurance Deficit: No PT Assessment Rehab Potential: Excellent Barriers to Discharge: Decreased caregiver support Barriers to Discharge Comments: Pt lives with 5 year old granddaughter, who works full-time.  PT Patient demonstrates impairments in the following area(s): Balance;Motor;Sensory;Safety;Perception PT Transfers Functional Problem(s): Bed Mobility;Bed to Chair;Car;Furniture;Floor PT Locomotion Functional Problem(s): Ambulation;Wheelchair Mobility;Stairs PT Plan PT Intensity: Minimum of 1-2 x/day ,45 to 90 minutes PT Frequency: 5 out of 7 days PT Duration Estimated Length of Stay: 21-25 days PT Treatment/Interventions: Ambulation/gait training;Balance/vestibular training;Cognitive remediation/compensation;Discharge planning;Neuromuscular re-education;Functional mobility training;DME/adaptive equipment  instruction;Disease management/prevention;Patient/family education;Splinting/orthotics;UE/LE Strength taining/ROM;Therapeutic Exercise;Wheelchair propulsion/positioning;Stair training;Therapeutic Activities PT Transfers Anticipated Outcome(s): Supervision-Min A PT Locomotion Anticipated Outcome(s): Min A-Mod A PT Recommendation Recommendations for Other Services: Neuropsych consult Follow Up Recommendations: Home health PT;24 hour  supervision/assistance Patient destination: Home Equipment Recommended: To be determined  Skilled Therapeutic Intervention PT evaluation performed. See below for detailed findings. Treatment initiated. Pt oriented x4. Session focused on gait training, stair negotiation, and orienting pt to w/c mobility and w/c parts management. See below for detailed description of gait pattern and assist, cueing, and manual facilitation required with gait, stairs, and w/c mobility. Therapist departed with pt seated in w/c with all needs within reach.   PT Evaluation Precautions/Restrictions Precautions Precautions: Fall Precaution Comments: Lt hemiparesis Restrictions Weight Bearing Restrictions: No General   Vital Signs  Pain Pain Assessment Pain Assessment: No/denies pain Home Living/Prior Functioning Home Living Available Help at Discharge: Family;Available 24 hours/day;Other (Comment) (granddaughter works FT; grandson and daughter can assist to provide 24/7 supervision/assist) Type of Home: House Home Access: Stairs to enter [employer] of Steps: 2-3 (per pt's daughter) Entrance Stairs-Rails: Left;Right Home Layout: One level Additional Comments: Pt does not own any personal DME, assistive devices  Lives With: Alone;Other (Comment) (56 year old granddaughter moved in ~1 month ago) Prior Function Level of Independence: Independent with basic ADLs;Independent with gait;Independent with transfers;Independent with homemaking with ambulation  Able to Take  Stairs?: Reciprically Driving: Yes Vocation: Part time employment 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 Leisure: Hobbies-yes (Comment) Comments: gardening, go to church Cognition Overall Cognitive Status: Impaired/Different from baseline Orientation Level: Oriented X4 Memory: Impaired Memory Impairment: Decreased recall of new information Awareness: Appears intact Problem Solving: Appears intact Safety/Judgment: Appears intact Comments: Pt demonstrated difficulty recalling medication after 5 minute delay  Sensation Sensation Light Touch: Impaired by gross assessment Proprioception: Appears Intact Additional Comments: pt reports slight dullness to touch in LUE in comparison  Coordination Gross Motor Movements are Fluid and Coordinated: No Fine Motor Movements are Fluid and Coordinated: No Finger Nose Finger Test: RUE WFL Motor  Motor Motor: Hemiplegia;Abnormal tone Motor - Skilled Clinical Observations: L hemiplegia; abnormal tone in L ankle plantarflexors  Mobility Bed Mobility Bed Mobility: Supine to Sit Supine to Sit: 3: Mod assist;With rails;HOB flat Supine to Sit Details: Verbal cues for sequencing;Verbal cues for technique;Manual facilitation for weight shifting Supine to Sit Details (indicate cue type and reason): Mod A to lift trunk Transfers Transfers: Yes Sit to Stand: With armrests;From chair/3-in-1;2: Max assist Sit to Stand Details: Verbal cues for technique;Tactile cues for weight beaing;Manual facilitation for weight shifting Sit to Stand Details (indicate cue type and reason): Verbal cueing for hand placementt, tactile cueing at L knee, manual facilitation of anterior weight shift Stand to Sit: 2: Max assist;With armrests;To chair/3-in-1 Stand to Sit Details (indicate cue type and reason): Verbal cues for technique;Tactile cues for weight beaing;Manual facilitation for weight shifting Stand to Sit Details: Verbal cueing for hand  placementt, tactile cueing at L knee, manual facilitation of anterior weight shift Lateral/Scoot Transfers: 1: +2 Total assist;With slide board;With armrests removed Lateral/Scoot Transfer Details: Manual facilitation for weight shifting Lateral/Scoot Transfer Details (indicate cue type and reason): Lateral scooting transfer from recliner > w/c with slide board requiring +2A for safety (pt performed 25-50% of transfer); manual facilitation of full anterior weight shift and manual stabilization/repositioning  of LLE Locomotion  Ambulation Ambulation: Yes Ambulation/Gait Assistance: 1: +2 Total assist (+2A for w/c follow) Ambulation Distance (Feet): 15 Feet Assistive device: Other (Comment) (R rail) Ambulation/Gait Assistance Details: Verbal cues for gait pattern;Tactile cues for weight shifting;Manual facilitation for placement Ambulation/Gait Assistance Details: Pt performed gait x15' in controlled environment with RUE support at hallway hand rail and +2A for safety (required >  75% assistance overall). Therapist positioned under LUE providing tactile cueing for lateral weight shift L side; manual stabilization of LLE during L stance phase. Verbal cueing for increased RLE step length. Required assist to advance LLE during final 5 steps. Gait Gait: Yes Gait Pattern: Step-to pattern;Decreased step length - right;Decreased stance time - left;Decreased hip/knee flexion - left;Decreased dorsiflexion - left;Decreased weight shift to left;Left flexed knee in stance;Trunk flexed;Trunk rotated posteriorly on left Stairs / Additional Locomotion Stairs: Yes Stairs Assistance: 1: +2 Total assist Stairs Assistance Details: Manual facilitation for placement;Manual facilitation for weight shifting;Verbal cues for technique;Verbal cues for gait pattern Stairs Assistance Details (indicate cue type and reason): Pt negotiated 3 stairs with R rail and +2A for safety (pt performed >50%); step-tp pattern and  forward-facing to ascend and backwards to descend. Required manual stabilization of L knee during L stance. Stair Management Technique: One rail Right;Step to pattern;Backwards;Forwards Number of Stairs: 3 Architect: Yes Wheelchair Assistance: 3: Building surveyor Details: Tactile cues for weight shifting;Verbal cues for technique;Tactile cues for sequencing Wheelchair Propulsion: Right upper extremity;Right lower extremity Wheelchair Parts Management: Needs assistance Distance: 75  Trunk/Postural Assessment  Cervical Assessment Cervical Assessment: Exceptions to [facility] (lower cervical spine flexion, upper cervical spine extension) Thoracic Assessment Thoracic Assessment: Exceptions to [facility] (thoracic kyphosis) Lumbar Assessment Lumbar Assessment: Exceptions to [facility] (posterior pelvic tilt) Postural Control Postural Control: Deficits on evaluation  Balance Balance Balance Assessed: Yes Dynamic Sitting Balance Dynamic Sitting - Balance Support: Right upper extremity supported;Feet supported Dynamic Sitting - Level of Assistance: 3: Mod assist Sitting balance - Comments: During RLE MMT (seated), pt required mod A to prevent posterior LOB. Static Standing Balance Static Standing - Balance Support: Right upper extremity supported Static Standing - Level of Assistance: 2: Max assist Static Standing - Comment/# of Minutes: Static standing  x10 seconds with RUE support at rail and manual stabilization of L knee to prevent buckling. Extremity Assessment  RUE Assessment RUE Assessment: Within Functional Limits LUE Assessment LUE Assessment: Exceptions to [facility] (flaccid, 0/5, can shrug shoulder) RLE Assessment RLE Assessment: Within Functional Limits LLE Assessment LLE Assessment: Exceptions to [facility] LLE Strength LLE Overall Strength: Deficits Left Hip Flexion: 0/5 Left Knee Flexion: 3-/5 Left Knee Extension: 3-/5 Left Ankle Dorsiflexion: 0/5 Left  Ankle Plantar Flexion: 1/5 LLE Tone LLE Tone: Mild LLE Tone Comments: L ankle plantarflexors  FIM:  FIM - Control and instrumentation engineer Devices: Sliding board;Arm rests Bed/Chair Transfer: 1: Two helpers FIM - Locomotion: Wheelchair Distance: 75 Locomotion: Wheelchair: 2: Travels 50 - 149 ft with moderate assistance (Pt: 50 - 74%) FIM - Locomotion: Ambulation Locomotion: Ambulation Assistive Devices: Other (comment) (R hallway hand rail) Ambulation/Gait Assistance: 1: +2 Total assist (+2A for w/c follow) Locomotion: Ambulation: 1: Two helpers FIM - Locomotion: Stairs Locomotion: Scientist, physiological: Insurance account manager - 1 Locomotion: Stairs: 1: Two helpers   Refer to [employer] for [place]  Recommendations for other services: Neuropsych  Discharge Criteria: Patient will be discharged from PT if patient refuses treatment 3 consecutive times without medical reason, if treatment goals not met, if there is a change in medical status, if patient makes no progress towards goals or if patient is discharged from hospital.  The above assessment, treatment plan, treatment alternatives and goals were discussed and mutually agreed upon: by patient  Stefano Gaul 08/27/2013, 8:10 PM

## 2013-08-27 NOTE — Evaluation (Signed)
Speech Language Pathology Assessment and Plan  Patient Details  Name: [name] MRN: [id number] Date of Birth: 11-28-1950  SLP Diagnosis: Dysarthria;Cognitive Impairments  Rehab Potential: Excellent ELOS: D/C 09/18/13 from CIR but may D/C from SLP caseload earlier depending on progress   Today's Date: 08/27/2013 Time: 8675-4492 Time Calculation (min): 56 min  Problem List:  Patient Active Problem List   Diagnosis Date Noted  . Chronic diastolic heart failure 01/00/7121  . Dyslipidemia 08/26/2013  . Borderline diabetes 08/26/2013  . CVA (cerebral infarction) 08/26/2013  . Acute CVA (cerebrovascular accident) 08/23/2013  . Pre-syncope 08/17/2012  . Hypokalemia 08/17/2012  . ARTHRITIS 03/28/2010  . DE QUERVAIN'S TENOSYNOVITIS 03/28/2010  . Normocytic anemia 07/26/2009  . REACTIVE AIRWAY DISEASE 03/09/2009  . DENTAL CARIES 03/09/2009  . CANDIDIASIS OF VULVA AND VAGINA 06/04/2008  . SKIN TAG 06/04/2008  . ARTHRITIS, KNEES, BILATERAL 05/22/2008  . HYPERTENSION, BENIGN ESSENTIAL 05/04/2008  . KNEE PAIN, BILATERAL 05/04/2008  . COUGH 05/04/2008  . MENISCUS TEAR, RIGHT 12/07/2005   Past Medical History:  Past Medical History  Diagnosis Date  . Hypertension   . Arthritis    Past Surgical History:  Past Surgical History  Procedure Laterality Date  . Joint replacement    . Replacement total knee Left     Assessment / Plan / Recommendation Clinical Impression Patient is a 63 y.o. right handed female with history of hypertension. Patient lives alone and was independent prior to admission. Admitted 08/23/2013 with left-sided weakness of acute onset after returning home from work as well as slurred speech. MRI showed acute infarction within the right posterior limb internal capsule/corona radiata. MRA of the head with no major occlusion or stenosis. Echocardiogram with ejection fraction of 97% grade 1 diastolic dysfunction. Carotid Dopplers with no ICA stenosis. Patient did not  receive TPA. Neurology consulted placed on aspirin for CVA prophylaxis as well as enrollment in the Point trial study. She is tolerating a regular consistency diet. Physical and occupational therapy evaluation completed 08/25/2013 with recommendations of physical medicine rehabilitation consult. Patient was admitted for comprehensive rehabilitation program on 08/26/2013 and was administered a cognitive-linguistic evaluation. Pt demonstrates mild cognitive impairments characterized by decreased recall of newly learned information which impacts her overall safety, especially in regards to medication management.  Pt also demonstrates mild-moderate dysarthria characterized by decreased vocal intensity, decreased breath support and lingual and labial weakness resulting in imprecise consonants and overall decreased speech intelligibility. Pt would benefit from skilled SLP intervention in order to maximize her cognitive function and speech intelligibility in order to maximize her overall functional independence.   Skilled Therapeutic Interventions          Administered a cognitive-linguistic evaluation. Please see above for details. Educated patient on her current cognitive deficits and decreased speech intelligibility and goals of skilled SLP intervention. She verbalized understanding.   SLP Assessment  Patient will need skilled [place] [facility] during CIR admission    Recommendations  Oral Care Recommendations: Oral care BID Patient destination: Home Follow up Recommendations: None Equipment Recommended: None recommended by SLP    SLP Frequency 5 out of 7 days   SLP Treatment/Interventions Cognitive remediation/compensation;Cueing hierarchy;Functional tasks;Internal/external aids;Environmental controls;Oral motor exercises;Speech/Language facilitation;Patient/family education;Therapeutic Activities    Pain No/Denies Pain   Short Term Goals: Week 1: SLP Short Term Goal 1 (Week 1): Pt  will utilize speech intelligibility strategies (slow rate, increased vocal intensity) at the sentence level with Min A multimodal cues.  SLP Short Term Goal 2 (Week 1): Pt  will utilize diaphragmatic breathing at the phrase level with Min A multimodal cues.  SLP Short Term Goal 3 (Week 1): Pt will recall current medications with Mod I. SLP Short Term Goal 4 (Week 1): Pt will demonstrate appropriate medication management with use of a pill box with Mod I.   See FIM for current functional status Refer to Care Plan for Long Term Goals  Recommendations for other services: None  Discharge Criteria: Patient will be discharged from SLP if patient refuses treatment 3 consecutive times without medical reason, if treatment goals not met, if there is a change in medical status, if patient makes no progress towards goals or if patient is discharged from hospital.  The above assessment, treatment plan, treatment alternatives and goals were discussed and mutually agreed upon: by patient  Trianna Lupien 08/27/2013, 3:24 PM

## 2013-08-27 NOTE — Progress Notes (Signed)
Patient information reviewed and entered into eRehab system by Merrell Borsuk, RN, CRRN, PPS Coordinator.  Information including medical coding and functional independence measure will be reviewed and updated through discharge.     Per nursing patient was given "Data Collection Information Summary for Patients in Inpatient Rehabilitation Facilities with attached "Privacy Act Statement-Health Care Records" upon admission.  

## 2013-08-27 NOTE — Evaluation (Signed)
Occupational Therapy Assessment and Plan  Patient Details  Name: [name] MRN: [id number] Date of Birth: 1950-09-06  OT Diagnosis: flaccid hemiplegia and hemiparesis, hemiplegia affecting non-dominant side and muscle weakness (generalized) Rehab Potential: Rehab Potential: Good ELOS: 20-25 days   Today's Date: 08/27/2013 Time: 4081-4481 and 8563-1497 Time Calculation (min): 60 min and 25 min  Problem List:  Patient Active Problem List   Diagnosis Date Noted  . Chronic diastolic heart failure 02/63/7858  . Dyslipidemia 08/26/2013  . Borderline diabetes 08/26/2013  . CVA (cerebral infarction) 08/26/2013  . Acute CVA (cerebrovascular accident) 08/23/2013  . Pre-syncope 08/17/2012  . Hypokalemia 08/17/2012  . ARTHRITIS 03/28/2010  . DE QUERVAIN'S TENOSYNOVITIS 03/28/2010  . Normocytic anemia 07/26/2009  . REACTIVE AIRWAY DISEASE 03/09/2009  . DENTAL CARIES 03/09/2009  . CANDIDIASIS OF VULVA AND VAGINA 06/04/2008  . SKIN TAG 06/04/2008  . ARTHRITIS, KNEES, BILATERAL 05/22/2008  . HYPERTENSION, BENIGN ESSENTIAL 05/04/2008  . KNEE PAIN, BILATERAL 05/04/2008  . COUGH 05/04/2008  . MENISCUS TEAR, RIGHT 12/07/2005    Past Medical History:  Past Medical History  Diagnosis Date  . Hypertension   . Arthritis    Past Surgical History:  Past Surgical History  Procedure Laterality Date  . Joint replacement    . Replacement total knee Left     Assessment & Plan Clinical Impression: Patient is a 63 y.o. right handed female with history of hypertension. Patient lives alone and was independent prior to admission. Admitted 08/23/2013 with left-sided weakness of acute onset after returning home from work as well as slurred speech. MRI showed acute infarction within the right posterior limb internal capsule/corona radiata. MRA of the head with no major occlusion or stenosis. Echocardiogram with ejection fraction of 85% grade 1 diastolic dysfunction. Carotid Dopplers with no ICA  stenosis. Patient did not receive TPA. Neurology consulted placed on aspirin for CVA prophylaxis as well as enrollment in the Point trial study. She is tolerating a regular consistency diet. Physical and occupational therapy evaluation completed 08/25/2013 with recommendations of physical medicine rehabilitation consult. Patient was admitted for comprehensive rehabilitation program.  Patient transferred to CIR on 08/26/2013 .    Patient currently requires total with basic self-care skills secondary to abnormal tone and decreased coordination and hemiplegia.  Prior to hospitalization, patient could complete ADLs with independent .  Patient will benefit from skilled intervention to decrease level of assist with basic self-care skills and increase independence with basic self-care skills prior to discharge home with care partner.  Anticipate patient will require 24 hour supervision and minimal physical assistance and follow up home health.  OT - End of Session Activity Tolerance: Tolerates 30+ min activity without fatigue Endurance Deficit: No OT Assessment Rehab Potential: Good OT Patient demonstrates impairments in the following area(s): Balance;Motor;Pain;Perception;Safety;Sensory OT Basic ADL's Functional Problem(s): Grooming;Bathing;Dressing;Toileting OT Advanced ADL's Functional Problem(s): Simple Meal Preparation OT Transfers Functional Problem(s): Toilet;Tub/Shower OT Additional Impairment(s): Fuctional Use of Upper Extremity OT Plan OT Intensity: Minimum of 1-2 x/day, 45 to 90 minutes OT Frequency: 5 out of 7 days OT Duration/Estimated Length of Stay: 20-25 days OT Treatment/Interventions: Balance/vestibular training;Discharge planning;Disease Lawyer;Functional mobility training;Neuromuscular re-education;Pain management;Patient/family education;Psychosocial support;Self Care/advanced ADL retraining;Therapeutic Activities;Therapeutic  Exercise;UE/LE Strength taining/ROM;UE/LE Coordination activities OT Self Feeding Anticipated Outcome(s): setup OT Basic Self-Care Anticipated Outcome(s): supervision UB, min assist LB OT Toileting Anticipated Outcome(s): min assist OT Bathroom Transfers Anticipated Outcome(s): supervision OT Recommendation Patient destination: Home Follow Up Recommendations: Home health OT;24 hour supervision/assistance Equipment Recommended: 3 in 1 bedside  comode;Tub/shower bench   Skilled Therapeutic Intervention 1) OT eval initiated, ADL assessment conducted with focus on bed mobility, sitting balance, sit >stand, and functional transfers.  Pt demonstrated carryover of education from acute rehab with use of RLE to advance LLE to EOB during supine to sit, manual facilitation at trunk to elicit weight shift into sitting.  Pt with close supervision during static sitting, and min assist with dynamic sitting while completing LB bathing.  LUE flaccid with inability to incorporate it into self-care tasks.  Required +2 for hygiene and LB dressing at standing level with therapist providing facilitation at trunk and LLE to promote standing balance while 2nd person pulled pants over hips.  Stand pivot to Rt bed > recliner with therapist progressing LLE and second person for safety.  In supported sitting pt able to elicit slight shoulder elevation.  2) Engaged in [place] through [place] while reaching diagonally up and down outside BOS to challenge sitting balance and increase weight bearing through LUE.  Performed squat pivot transfer to/from therapy mat with mod assist with pt able to lift buttocks and pivot with tactile cues and manual facilitation.    OT Evaluation Precautions/Restrictions  Precautions Precautions: Fall Precaution Comments: Lt hemiparesis Restrictions Weight Bearing Restrictions: No Pain Pain Assessment Pain Assessment: No/denies pain Home Living/Prior Functioning Home Living Available Help at  Discharge: Family;Available 24 hours/day;Other (Comment) (granddaughter works FT; grandson and daughter can assist to provide 24/7 supervision/assist) Type of Home: House Home Access: Stairs to enter [employer] of Steps: 2-3 (per pt's daughter) Entrance Stairs-Rails: Left;Right Home Layout: One level Additional Comments: Pt does not own any personal DME, assistive devices  Lives With: Alone;Other (Comment) (108 year old granddaughter moved in ~1 month ago) IADL History Homemaking Responsibilities: Yes Meal Prep Responsibility: Primary Laundry Responsibility: Primary Cleaning Responsibility: Primary Bill Paying/Finance Responsibility: Primary Shopping Responsibility: Primary Current License: Yes Mode of Transportation: Car Occupation: Part time employment 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 [employer] Function Level of Independence: Independent with basic ADLs;Independent with gait;Independent with transfers;Independent with homemaking with ambulation  Able to Take Stairs?: Reciprically Driving: Yes Vocation: Part time employment 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 Leisure: Hobbies-yes (Comment) Comments: gardening, go to church ADL  See FIM Vision/Perception  Vision- History Baseline Vision/History: Wears glasses Wears Glasses: Reading only Patient Visual Report: No change from baseline Vision- Assessment Vision Assessment?: Yes Eye Alignment: Within Functional Limits Ocular Range of Motion: Within Functional Limits Alignment/Gaze Preference: Within Defined Limits Tracking/Visual Pursuits: Requires cues, head turns, or add eye shifts to track Saccades: Within functional limits  Cognition Overall Cognitive Status: Within Functional Limits for tasks assessed Orientation Level: Oriented X4 Safety/Judgment: Appears intact Sensation Sensation Light Touch: Impaired by gross assessment Proprioception:  Appears Intact Additional Comments: pt reports slight dullness to touch in LUE in comparison  Coordination Fine Motor Movements are Fluid and Coordinated: No (LUE flaccid, RUE WFL) Finger Nose Finger Test: RUE WFL Motor  Motor Motor: Hemiplegia Mobility  Bed Mobility Bed Mobility: Supine to Sit Supine to Sit: 3: Mod assist;With rails;HOB flat Supine to Sit Details: Verbal cues for sequencing;Verbal cues for technique;Manual facilitation for weight shifting Supine to Sit Details (indicate cue type and reason): Mod A to lift trunk Transfers Sit to Stand: With armrests;From chair/3-in-1;2: Max assist Sit to Stand Details: Verbal cues for technique;Tactile cues for weight beaing;Manual facilitation for weight shifting Sit to Stand Details (indicate cue type and reason): Verbal cueing for hand placementt, tactile cueing at L  knee, manual facilitation of anterior weight shift Stand to Sit: 2: Max assist;With armrests;To chair/3-in-1 Stand to Sit Details (indicate cue type and reason): Verbal cues for technique;Tactile cues for weight beaing;Manual facilitation for weight shifting Stand to Sit Details: Verbal cueing for hand placementt, tactile cueing at L knee, manual facilitation of anterior weight shift  Extremity/Trunk Assessment RUE Assessment RUE Assessment: Within Functional Limits LUE Assessment LUE Assessment: Exceptions to [facility] (flaccid, 0/5, can shrug shoulder)  FIM:  FIM - Eating Eating Activity: 5: Set-up assist for cut food;5: Set-up assist for open containers FIM - Grooming Grooming Steps: Wash, rinse, dry face Grooming: 2: Patient completes 1 of 4 or 2 of 5 steps FIM - Bathing Bathing Steps Patient Completed: Chest;Left Arm;Abdomen;Front perineal area;Right upper leg;Left upper leg Bathing: 3: Mod-Patient completes 5-7 [id number] parts or 50-74% FIM - Upper Body Dressing/Undressing Upper body dressing/undressing: 0: Wears gown/pajamas-no public clothing FIM - Lower Body  Dressing/Undressing Lower body dressing/undressing: 1: Two helpers FIM - TPsychologist, occupationalsteps completed by patient: Performs perineal hCopywriter, advertisingDevices: Grab bar or rail for support Toileting: 2: Max-Patient completed 1 of 3 steps FIM - BControl and instrumentation engineerDevices: Arm rests Bed/Chair Transfer: 3: Chair or W/C > Bed: Mod A (lift or lower assist)   Refer to Care Plan for Long Term Goals  Recommendations for other services: None  Discharge Criteria: Patient will be discharged from OT if patient refuses treatment 3 consecutive times without medical reason, if treatment goals not met, if there is a change in medical status, if patient makes no progress towards goals or if patient is discharged from hospital.  The above assessment, treatment plan, treatment alternatives and goals were discussed and mutually agreed upon: by patient  HSimonne Come7/22/2015, 12:06 PM

## 2013-08-27 NOTE — Progress Notes (Signed)
Subjective/Complaints: 63 y.o. right handed female with history of hypertension. Patient lives alone and was independent prior to admission. Admitted 08/23/2013 with left-sided weakness of acute onset after returning home from work as well as slurred speech. MRI showed acute infarction within the right posterior limb internal capsule/corona radiata. MRA of the head with no major occlusion or stenosis. Echocardiogram with ejection fraction of 77% grade 1 diastolic dysfunction. Carotid Dopplers with no ICA stenosis. Patient did not receive TPA. Neurology consulted placed on aspirin for CVA prophylaxis as well as enrollment in the Point trial study.   Slept poorly, no pain c/o  Review of Systems - Negative except frequent urination no burning  Objective: Vital Signs: Blood pressure 133/76, pulse 74, temperature 98 F (36.7 C), temperature source Oral, resp. rate 18, height 5' 3"  (1.6 m), weight 110.5 kg (243 lb 9.7 oz), SpO2 93.00%. No results found. Results for orders placed during the hospital encounter of 08/26/13 (from the past 72 hour(s))  CBC WITH DIFFERENTIAL     Status: Abnormal   Collection Time    08/27/13  6:36 AM      Result Value Ref Range   WBC 5.2  4.0 - 10.5 K/uL   RBC 4.25  3.87 - 5.11 MIL/uL   Hemoglobin 12.3  12.0 - 15.0 g/dL   HCT 38.3  36.0 - 46.0 %   MCV 90.1  78.0 - 100.0 fL   MCH 28.9  26.0 - 34.0 pg   MCHC 32.1  30.0 - 36.0 g/dL   RDW 14.6  11.5 - 15.5 %   Platelets 220  150 - 400 K/uL   Neutrophils Relative % 30 (*) 43 - 77 %   Neutro Abs 1.6 (*) 1.7 - 7.7 K/uL   Lymphocytes Relative 53 (*) 12 - 46 %   Lymphs Abs 2.7  0.7 - 4.0 K/uL   Monocytes Relative 11  3 - 12 %   Monocytes Absolute 0.6  0.1 - 1.0 K/uL   Eosinophils Relative 6 (*) 0 - 5 %   Eosinophils Absolute 0.3  0.0 - 0.7 K/uL   Basophils Relative 0  0 - 1 %   Basophils Absolute 0.0  0.0 - 0.1 K/uL  COMPREHENSIVE METABOLIC PANEL     Status: Abnormal   Collection Time    08/27/13  6:36 AM   Result Value Ref Range   Sodium 142  137 - 147 mEq/L   Potassium 4.3  3.7 - 5.3 mEq/L   Chloride 103  96 - 112 mEq/L   CO2 28  19 - 32 mEq/L   Glucose, Bld 108 (*) 70 - 99 mg/dL   BUN 15  6 - 23 mg/dL   Creatinine, Ser 0.74  0.50 - 1.10 mg/dL   Calcium 8.9  8.4 - 10.5 mg/dL   Total Protein 7.3  6.0 - 8.3 g/dL   Albumin 3.4 (*) 3.5 - 5.2 g/dL   AST 14  0 - 37 U/L   ALT 23  0 - 35 U/L   Alkaline Phosphatase 67  39 - 117 U/L   Total Bilirubin 0.4  0.3 - 1.2 mg/dL   GFR calc non Af Amer 89 (*) >90 mL/min   GFR calc Af Amer >90  >90 mL/min   Comment: (NOTE)     The eGFR has been calculated using the CKD EPI equation.     This calculation has not been validated in all clinical situations.     eGFR's persistently <90 mL/min signify possible  Chronic Kidney     Disease.   Anion gap 11  5 - 15     HEENT: normal Cardio: RRR Resp: CTA B/L GI: BS positive Extremity:  No Edema Skin:   Intact Neuro: Flat, Abnormal Motor 0/5 LUE, 1+ L HF, 0/5 L foot/ankle and Dysarthric Musc/Skel:  Normal Gen NAD   Assessment/Plan: 1. Functional deficits secondary to R PLIC infarct which require 3+ hours per day of interdisciplinary therapy in a comprehensive inpatient rehab setting. Physiatrist is providing close team supervision and 24 hour management of active medical problems listed below. Physiatrist and rehab team continue to assess barriers to discharge/monitor patient progress toward functional and medical goals. FIM:                                  Medical Problem List and Plan:  1. Functional deficits secondary to right PLIC infarcts secondary to small vessel disease  2. DVT Prophylaxis/Anticoagulation: SCDs. Monitor for any signs of DVT  3. Pain Management: Tylenol as needed  4. Hypertension. Lopressor 12.5 mg twice a day. Monitor with increased mobility  5. Neuropsych: This patient is capable of making decisions on her own behalf.  6. Hyperlipidemia. Lipitor   LOS  (Days) 1 A FACE TO FACE EVALUATION WAS PERFORMED  KIRSTEINS,ANDREW E 08/27/2013, 7:54 AM

## 2013-08-27 NOTE — Patient Care Conference (Signed)
Inpatient RehabilitationTeam Conference and Plan of Care Update Date: 08/27/2013   Time: 11;20 AM    Patient Name: [name]      Medical Record Number: [id number]  Date of Birth: 10/17/1950 Sex: Female         Room/Bed: 4M09C/4M09C-01 Payor Info: Payor: MEDICARE / Plan: MEDICARE PART A AND B / Product Type: *No Product type* /    Admitting Diagnosis: R CVA  Admit Date/Time:  08/26/2013  5:49 PM Admission Comments: No comment available   Primary Diagnosis:  <principal problem not specified> Principal Problem: <principal problem not specified>  Patient Active Problem List   Diagnosis Date Noted  . Chronic diastolic heart failure 09/19/4816  . Dyslipidemia 08/26/2013  . Borderline diabetes 08/26/2013  . CVA (cerebral infarction) 08/26/2013  . Acute CVA (cerebrovascular accident) 08/23/2013  . Pre-syncope 08/17/2012  . Hypokalemia 08/17/2012  . ARTHRITIS 03/28/2010  . DE QUERVAIN'S TENOSYNOVITIS 03/28/2010  . Normocytic anemia 07/26/2009  . REACTIVE AIRWAY DISEASE 03/09/2009  . DENTAL CARIES 03/09/2009  . CANDIDIASIS OF VULVA AND VAGINA 06/04/2008  . SKIN TAG 06/04/2008  . ARTHRITIS, KNEES, BILATERAL 05/22/2008  . HYPERTENSION, BENIGN ESSENTIAL 05/04/2008  . KNEE PAIN, BILATERAL 05/04/2008  . COUGH 05/04/2008  . MENISCUS TEAR, RIGHT 12/07/2005    Expected Discharge Date: Expected Discharge Date: 09/18/13  Team Members Present: Physician leading conference: Dr. Alysia Penna Social Worker Present: Ovidio Kin, LCSW Nurse Present: Elliot Cousin, RN PT Present: Billie Ruddy, PT;Caroline Lacinda Axon, PT OT Present: Simonne Come, OT SLP Present: Windell Moulding, SLP PPS Coordinator present : Ileana Ladd, Lelan Pons, RN, CRRN     Current Status/Progress Goal Weekly Team Focus  Medical   dysarthric, severe R HP  maximize functional recovery  Emphasis on emerging motor returen RLE   Bowel/Bladder     timed tolieting every 3 hours   cont B & B   timed tolieting   Swallow/Nutrition/ Hydration     wfl        ADL's   +2 transfers and LB self-care  supervision seated self-care, min assist LB and toileting  transfers, balance, LUE NMR   Mobility   +2A overall  Supervision transfers; Min A gait and stairs  basic transfers, bed mobility, standing, gait/stairs, L NMR, initiate patient/family education   Communication   min assist for speech intelligibility   mod I  educaiton and carryover of compensatory strategies    Safety/Cognition/ Behavioral Observations  min assist for higher level problem solving and memory   mod I   education and carryover of compensatory strategies    Pain     pain less than 3, monitor while here   no pain issues reported     Skin     monitor skin-no issues-none currently           *See Care Plan and progress notes for long and short-term goals.  Barriers to Discharge: Functional level low    Possible Resolutions to Barriers:  cont rehab    Discharge Planning/Teaching Needs:    Home with daughter's assisting-aware of the alternative options-confirm with daughter's     Team Discussion:  Severe weakness on left side-OT reports moved arm this am.  New eval Should do well here-MD  Revisions to Treatment Plan:  New eval   Continued Need for Acute Rehabilitation Level of Care: The patient requires daily medical management by a physician with specialized training in physical medicine and rehabilitation for the following conditions: Daily direction of a multidisciplinary physical rehabilitation  program to ensure safe treatment while eliciting the highest outcome that is of practical value to the patient.: Yes Daily medical management of patient stability for increased activity during participation in an intensive rehabilitation regime.: Yes Daily analysis of laboratory values and/or radiology reports with any subsequent need for medication adjustment of medical intervention for : Neurological problems;Other  [name] 08/29/2013, 3:26 PM

## 2013-08-28 ENCOUNTER — Inpatient Hospital Stay (HOSPITAL_COMMUNITY): Payer: Medicare Other | Admitting: Speech Pathology

## 2013-08-28 ENCOUNTER — Encounter (HOSPITAL_COMMUNITY): Payer: Medicare Other | Admitting: Occupational Therapy

## 2013-08-28 ENCOUNTER — Inpatient Hospital Stay (HOSPITAL_COMMUNITY): Payer: Medicare Other | Admitting: Physical Therapy

## 2013-08-28 DIAGNOSIS — I6330 Cerebral infarction due to thrombosis of unspecified cerebral artery: Secondary | ICD-10-CM

## 2013-08-28 DIAGNOSIS — Z5189 Encounter for other specified aftercare: Secondary | ICD-10-CM

## 2013-08-28 DIAGNOSIS — I10 Essential (primary) hypertension: Secondary | ICD-10-CM

## 2013-08-28 NOTE — Progress Notes (Signed)
Speech Language Pathology Daily Session Note  Patient Details  Name: [name] MRN: [id number] Date of Birth: 1950-08-31  Today's Date: 08/28/2013 Time: 1420-1450 Time Calculation (min): 30 min  Short Term Goals: Week 1: SLP Short Term Goal 1 (Week 1): Pt will utilize speech intelligibility strategies (slow rate, increased vocal intensity) at the sentence level with Min A multimodal cues.  SLP Short Term Goal 2 (Week 1): Pt will utilize diaphragmatic breathing at the phrase level with Min A multimodal cues.  SLP Short Term Goal 3 (Week 1): Pt will recall current medications with Mod I. SLP Short Term Goal 4 (Week 1): Pt will demonstrate appropriate medication management with use of a pill box with Mod I.   Skilled Therapeutic Interventions:  Pt was seen for skilled speech therapy targeting executive function for medication management.  Pt was received from PT session, upright in wheelchair.  Pt was noted with strong cough x2 immediately following cup sips of thin liquids and reports that she coughs consistently during meals.  SLP had pt take small, controlled cup sips with no further s/s of aspiration.  SLP further facilitated the session with a structured medication management task with pt exhibiting 100% accuracy for loading a pill box of trial medications of varying dosages and frequencies with initial supervision instructional cues.  SLP recommended that pt have assistance for medication and financial management upon discharge from family.  Continue per current plan of care.    FIM:  Comprehension Comprehension Mode: Auditory Comprehension: 5-Understands complex 90% of the time/Cues < 10% of the time Expression Expression Mode: Verbal Expression: 3-Expresses basic 50 - 74% of the time/requires cueing 25 - 50% of the time. Needs to repeat parts of sentences. Social Interaction Social Interaction: 6-Interacts appropriately with others with medication or extra time (anti-anxiety,  antidepressant). Problem Solving Problem Solving: 5-Solves complex 90% of the time/cues < 10% of the time Memory Memory: 5-Recognizes or recalls 90% of the time/requires cueing < 10% of the time  Pain Pain Assessment Pain Assessment: No/denies pain  Therapy/Group: Individual Therapy  Windell Moulding, M.A. CCC-SLP  Trinette Vera, Selinda Orion 08/28/2013, 5:34 PM

## 2013-08-28 NOTE — Progress Notes (Signed)
Social Work Assessment and Plan Social Work Assessment and Plan  Patient Details  Name: [name] MRN: [id number] Date of Birth: 12-22-1950  Today's Date: 08/28/2013  Problem List:  Patient Active Problem List   Diagnosis Date Noted  . Chronic diastolic heart failure 59/56/3875  . Dyslipidemia 08/26/2013  . Borderline diabetes 08/26/2013  . CVA (cerebral infarction) 08/26/2013  . Acute CVA (cerebrovascular accident) 08/23/2013  . Pre-syncope 08/17/2012  . Hypokalemia 08/17/2012  . ARTHRITIS 03/28/2010  . DE QUERVAIN'S TENOSYNOVITIS 03/28/2010  . Normocytic anemia 07/26/2009  . REACTIVE AIRWAY DISEASE 03/09/2009  . DENTAL CARIES 03/09/2009  . CANDIDIASIS OF VULVA AND VAGINA 06/04/2008  . SKIN TAG 06/04/2008  . ARTHRITIS, KNEES, BILATERAL 05/22/2008  . HYPERTENSION, BENIGN ESSENTIAL 05/04/2008  . KNEE PAIN, BILATERAL 05/04/2008  . COUGH 05/04/2008  . MENISCUS TEAR, RIGHT 12/07/2005   Past Medical History:  Past Medical History  Diagnosis Date  . Hypertension   . Arthritis    Past Surgical History:  Past Surgical History  Procedure Laterality Date  . Joint replacement    . Replacement total knee Left    Social History:  reports that she has never smoked. She does not have any smokeless tobacco history on file. She reports that she does not drink alcohol or use illicit drugs.  Family / Support Systems Marital Status: Widow/Widower Patient Roles: Parent Children: Aretha-daughter  [phone] Other Supports: April-daughter  [phone] Anticipated Caregiver: Daughter's and siblings Ability/Limitations of Caregiver: Daughter reports between all of them they will make sure she has care-all of the time Caregiver Availability: 24/7 Family Dynamics: Close knit family-pt has three daughter's and has 12 siblings.  Thye have a huge extended family who take care of each other.  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   Abuse/Neglect Physical Abuse: Denies Verbal Abuse: Denies Sexual Abuse: Denies Exploitation of patient/patient's resources: Denies Self-Neglect: Denies  Emotional Status Pt's affect, behavior adn adjustment status: Pt and daughter reports she has always been the one to help others and now it is her turn.  Pt is a Scientist, research (physical sciences) and usually can manage to find a way to be independent even if she has to adapt her ways or do the task differently.  Daughter reiterates her Mom is a very independent woman. Recent Psychosocial Issues: Other health issues but has managed and remained independent Pyschiatric History: No history deferred depression screen due to pt feels she is doing ok with all of this and wants time to adjust before feeling that she needs to see someone.  Will monitor her and follow in case need to intervene. Substance Abuse History: No issues  Patient / Family Perceptions, Expectations & Goals Pt/Family understanding of illness & functional limitations: Pt and daughter have a good understanding of her stroke and deficits.  Both feel she has made progress since coming into the hospital.  Both are hopeful she will continue to improve.  Daughter plans to come and attend therapies with Mom. Premorbid pt/family roles/activities: Mom, Retiree, grandmother, Home owner, Chruch member, sibling,etc Anticipated changes in roles/activities/participation: resume Pt/family expectations/goals: Pt states: " I want to be independent like I was before this happened."  Daughter states: " I hope she can do for herself,  but if she can't then we will help her."  [employer]: None Premorbid Home  Care/DME Agencies: None Transportation available at discharge: Family members Resource referrals recommended: Support group (specify) ([facility] SUpport group)  Discharge Planning Living Arrangements: [place]: Children;Other relatives;Friends/neighbors;Church/faith community Type of Residence: Private residence Insurance Resources: [employer] Financial Resources: [employer] Referred: Yes Living Expenses: Own Money Management: Patient Does the patient have any problems obtaining your medications?: No Home Management: Pateint Patient/Family Preliminary Plans: Return home wiht the assistance of her children and siblings.  Discussed with both the team recommends 24 hour care and pt will require some assistance.  Daughter reports she will get back with family to see about working out a plan.  Social Work Anticipated Follow Up Needs: HH/OP;Support Group  Clinical Impression Very motivated patient with her supportive daughter present.  She really wants to go to a annual family reunion 8/8 and wants to work toward going home earlier. Informed both she and daughter there are no passes here due to insurance purposes.  Pt wants to see how she is doing closer to the time.  Daughter will speak with  Other family members and work out a plan for home.  Work toward a safe discharge plan, both aware of the 24 hr care need.  [name] 08/28/2013, 12:00 PM

## 2013-08-28 NOTE — Progress Notes (Signed)
Occupational Therapy Session Note  Patient Details  Name: [name] MRN: [id number] Date of Birth: 1950/03/21  Today's Date: 08/28/2013 Time: 4287-6811 Time Calculation (min): 55 min  Short Term Goals: Week 1:  OT Short Term Goal 1 (Week 1): Pt will complete bathing with mod assist of 1 caregiver at sit > stand level OT Short Term Goal 2 (Week 1): Pt will complete LB dressing with max assist of 1 caregiver at sit > stand level OT Short Term Goal 3 (Week 1): Pt will complete UB dressing at mod assist OT Short Term Goal 4 (Week 1): Pt will complete toilet transfer max assist of 1 caregiver OT Short Term Goal 5 (Week 1): Pt will complete 1/3 toileting tasks with max assist of 1 caregiver  Skilled Therapeutic Interventions/Progress Updates:    Engaged in self-care retraining with focus on transfers, sit > stand, standing tolerance, and increased placement of LUE during self-care tasks of bathing and dressing. Stand pivot transfer to [facility] with mod assist with pt able to lift from bed, requiring assist with positioning and advancement of LLE during transfer.  Supervision sitting balance on BSC with forward weight shift to wipe buttocks, however required assist to ensure completely clean.  Bathing completed at sink at sit> stand level with education on proper placement of LUE during seated and standing tasks.  Pt utilized LUE to stabilize toothpaste while unscrewing cap.  Pt demonstrating increased postural control in standing at sink with min assist for standing while therapist pulled brief and pants over hips and with forward reaching to attempt to don shoes.  Pt would benefit from elastic shoelaces.   Therapy Documentation Precautions:  Precautions Precautions: Fall Precaution Comments: Lt hemiparesis Restrictions Weight Bearing Restrictions: No General:   Vital Signs: Therapy Vitals Pulse Rate: 74 BP: 120/74 mmHg Pain: Pain Assessment Pain Score: 0-No pain  See FIM for current  functional status  Therapy/Group: Individual Therapy  Simonne Come 08/28/2013, 10:42 AM

## 2013-08-28 NOTE — Progress Notes (Signed)
Subjective/Complaints: 63 y.o. right handed female with history of hypertension. Patient lives alone and was independent prior to admission. Admitted 08/23/2013 with left-sided weakness of acute onset after returning home from work as well as slurred speech. MRI showed acute infarction within the right posterior limb internal capsule/corona radiata. MRA of the head with no major occlusion or stenosis. Echocardiogram with ejection fraction of 44% grade 1 diastolic dysfunction. Carotid Dopplers with no ICA stenosis. Patient did not receive TPA. Neurology consulted placed on aspirin for CVA prophylaxis as well as enrollment in the Point trial study.   Slept ok some left shoulder pain  Review of Systems - Negative except frequent urination no burning  Objective: Vital Signs: Blood pressure 128/82, pulse 76, temperature 98.4 F (36.9 C), temperature source Oral, resp. rate 18, height 5' 3"  (1.6 m), weight 110.5 kg (243 lb 9.7 oz), SpO2 97.00%. No results found. Results for orders placed during the hospital encounter of 08/26/13 (from the past 72 hour(s))  CBC WITH DIFFERENTIAL     Status: Abnormal   Collection Time    08/27/13  6:36 AM      Result Value Ref Range   WBC 5.2  4.0 - 10.5 K/uL   RBC 4.25  3.87 - 5.11 MIL/uL   Hemoglobin 12.3  12.0 - 15.0 g/dL   HCT 38.3  36.0 - 46.0 %   MCV 90.1  78.0 - 100.0 fL   MCH 28.9  26.0 - 34.0 pg   MCHC 32.1  30.0 - 36.0 g/dL   RDW 14.6  11.5 - 15.5 %   Platelets 220  150 - 400 K/uL   Neutrophils Relative % 30 (*) 43 - 77 %   Neutro Abs 1.6 (*) 1.7 - 7.7 K/uL   Lymphocytes Relative 53 (*) 12 - 46 %   Lymphs Abs 2.7  0.7 - 4.0 K/uL   Monocytes Relative 11  3 - 12 %   Monocytes Absolute 0.6  0.1 - 1.0 K/uL   Eosinophils Relative 6 (*) 0 - 5 %   Eosinophils Absolute 0.3  0.0 - 0.7 K/uL   Basophils Relative 0  0 - 1 %   Basophils Absolute 0.0  0.0 - 0.1 K/uL  COMPREHENSIVE METABOLIC PANEL     Status: Abnormal   Collection Time    08/27/13  6:36 AM       Result Value Ref Range   Sodium 142  137 - 147 mEq/L   Potassium 4.3  3.7 - 5.3 mEq/L   Chloride 103  96 - 112 mEq/L   CO2 28  19 - 32 mEq/L   Glucose, Bld 108 (*) 70 - 99 mg/dL   BUN 15  6 - 23 mg/dL   Creatinine, Ser 0.74  0.50 - 1.10 mg/dL   Calcium 8.9  8.4 - 10.5 mg/dL   Total Protein 7.3  6.0 - 8.3 g/dL   Albumin 3.4 (*) 3.5 - 5.2 g/dL   AST 14  0 - 37 U/L   ALT 23  0 - 35 U/L   Alkaline Phosphatase 67  39 - 117 U/L   Total Bilirubin 0.4  0.3 - 1.2 mg/dL   GFR calc non Af Amer 89 (*) >90 mL/min   GFR calc Af Amer >90  >90 mL/min   Comment: (NOTE)     The eGFR has been calculated using the CKD EPI equation.     This calculation has not been validated in all clinical situations.     eGFR's  persistently <90 mL/min signify possible Chronic Kidney     Disease.   Anion gap 11  5 - 15     HEENT: normal Cardio: RRR Resp: CTA B/L GI: BS positive Extremity:  No Edema Skin:   Intact Neuro: Flat, Abnormal Motor 0/5 LUE, 1+ L HF, 0/5 L foot/ankle and Dysarthric Musc/Skel:  Normal, left shouer no increased tone, no pain with ext rotation, + pain with abduction Gen NAD   Assessment/Plan: 1. Functional deficits secondary to R PLIC infarct which require 3+ hours per day of interdisciplinary therapy in a comprehensive inpatient rehab setting. Physiatrist is providing close team supervision and 24 hour management of active medical problems listed below. Physiatrist and rehab team continue to assess barriers to discharge/monitor patient progress toward functional and medical goals. FIM: FIM - Bathing Bathing Steps Patient Completed: Chest;Left Arm;Abdomen;Front perineal area;Right upper leg;Left upper leg Bathing: 3: Mod-Patient completes 5-7 69f10 parts or 50-74%  FIM - Upper Body Dressing/Undressing Upper body dressing/undressing: 0: Wears gown/pajamas-no public clothing FIM - Lower Body Dressing/Undressing Lower body dressing/undressing: 1: Two helpers  FIM -  Toileting Toileting steps completed by patient: Performs perineal hCopywriter, advertisingDevices: Grab bar or rail for support Toileting: 2: Max-Patient completed 1 of 3 steps     FIM - BControl and instrumentation engineerDevices: Arm rests Bed/Chair Transfer: 3: Chair or W/C > Bed: Mod A (lift or lower assist)  FIM - Locomotion: Wheelchair Distance: 75 Locomotion: Wheelchair: 2: Travels 50 - 149 ft with moderate assistance (Pt: 50 - 74%) FIM - Locomotion: Ambulation Locomotion: Ambulation Assistive Devices: Other (comment) (R hallway hand rail) Ambulation/Gait Assistance: 1: +2 Total assist (+2A for w/c follow) Locomotion: Ambulation: 1: Two helpers  Comprehension Comprehension Mode: Auditory Comprehension: 5-Understands complex 90% of the time/Cues < 10% of the time  Expression Expression Mode: Verbal Expression: 3-Expresses basic 50 - 74% of the time/requires cueing 25 - 50% of the time. Needs to repeat parts of sentences.  Social Interaction Social Interaction: 6-Interacts appropriately with others with medication or extra time (anti-anxiety, antidepressant).  Problem Solving Problem Solving: 6-Solves complex problems: With extra time  Memory Memory: 5-Recognizes or recalls 90% of the time/requires cueing < 10% of the time  Medical Problem List and Plan:  1. Functional deficits secondary to right PLIC infarcts secondary to small vessel disease  2. DVT Prophylaxis/Anticoagulation: SCDs. Monitor for any signs of DVT  3. Pain Management: Tylenol as needed  4. Hypertension. Lopressor 12.5 mg twice a day. Monitor with increased mobility  5. Neuropsych: This patient is capable of making decisions on her own behalf.  6. Hyperlipidemia. Lipitor   LOS (Days) 2 A FACE TO FACE EVALUATION WAS PERFORMED  Sian Rockers E 08/28/2013, 6:56 AM

## 2013-08-28 NOTE — Progress Notes (Signed)
Physical Therapy Session Note  Patient Details  Name: [name] MRN: [id number] Date of Birth: Oct 19, 1950  Today's Date: 08/28/2013 Time: 1015-1100 and 1315-1420 Time Calculation (min): 45 min and 65 min  Short Term Goals: Week 1:  PT Short Term Goal 1 (Week 1): Pt will perform supine>sit with with HOB flat using rail requiring min A and 25% cueing for sequencing. PT Short Term Goal 2 (Week 1): Pt will perform sit>supine with HOB flat requiring min A and 25% cueing for sequencing. PT Short Term Goal 3 (Week 1): Pt will perform bed<>chair transfer with max A and 50% cueing for technique and sequencing. PT Short Term Goal 4 (Week 1): Pt will perform w/c mobility x150' in controlled environment with supervision and 25% cueing for technique. PT Short Term Goal 5 (Week 1): Pt will perform gait x50' in controlled environment with Total A of single therapist and 75% cueing.  Skilled Therapeutic Interventions/Progress Updates:    Treatment Session 1: Pt received seated in w/c with daughter and granddaughter present; agreeable to therapy. Session focused on functional transfers and LLE NMR. Pt performed w/c mobility x150' in controlled environment with R hemi technique requiring supervision, increased time, and tactile cueing at RLE during initial 15' of trial. Performed gait x30' in controlled environment with RUE support at hallway handrail and mod A (+2A for w/c follow); pt able to advance LLE without assist during 75% of trial, able to stabilize LLE during stance phase during final 50% of trial. Explained, demonstrated management of w/c parts and safe transfer setup. Pt verbalized understanding but will require reinforcement of arm/leg rest management. Pt performed squat pivot transfers from w/c<>mat table with min A, verbal/tactile cueing for anterior weight shift and hand placement. Sit>supine on mat table (HOB flat, no rails) with min A for LLE management; supine>sit with mod A to lift trunk and  effective between-session carryover of LLE management using RLE. See below for detailed description of NMR interventions. Session ended in pt room, where pt was left seated in w/c with family members present and all needs within reach.  Treatment Session 2: Pt received seated in w/c accompanied by RN and nurse tech. Pt expressing need to have BM. Performed stand pivot transfer from w/c<>toilet with max A using grab bar. Noted incontinent BM. Performed sit<>stand transfer from toilet (during brief change, LB dressing) with RUE support at grab bar with min A, tactile cueing at L knee. Static standing with RUE support at grab bar (during peri care) x1.5 minutes with min guard-min A; trial ended secondary to pt fatigue. Transported pt to ortho gym, where pt gait training interventions were performed using Lite Gait; see below for detailed description. Session ended in pt room, where pt was left seated in w/c accompanied by granddaughter with all needs within reach.  Therapy Documentation Precautions:  Precautions Precautions: Fall Precaution Comments: Lt hemiparesis Restrictions Weight Bearing Restrictions: No Vital Signs: Therapy Vitals Temp: 98.1 F (36.7 C) Temp src: Oral Pulse Rate: 83 Resp: 18 (18) BP: 141/87 mmHg Patient Position (if appropriate): Sitting Oxygen Therapy SpO2: 96 % O2 Device: None (Room air) Pain: Pain Assessment Pain Assessment: No/denies pain Pain Score: 0-No pain NMR:  Pt performed the following LLE AAROM in R side lying using powder board and Maxi Slide with mirror for visual feedback:  isolated L hip flexion/extension 2x15 reps per direction, knee flexion/extension 2x20 reps per direction. Performed PROM of ankle plantar/dorsiflexion x15 reps.Pt able to perform active L hip flexion/knee extension (>50% of  full ROM), hip extension/knee flexion (<50% of full ROM). Trace activation of L ankle plantar flexors note. L great toe extension present but no activation of  ankle dorsiflexors palpable. Gait Training: The following gait training interventions were performed using LiteGait over treadmill: gait x126' total on treadmill with manual facilitation of lateral weight shift to R side, tactile cueing at L ankle dorsiflexors and distal L hamstrings during LLE swing phase, at L distal quadriceps (focus on VMO) from LLE initial contact and throughout LLE stance phase to promote L stance stability. Harness biased to increase weight shift to R to increase pt independence with LLE advancement: treadmill speed: .3-.5; Time: 4:24 (rest breaks x3 to adjust harness, provide feedback). L hand secured to grab bar for functional LUE weight bearing. Pt able to advance LLE without assist during ~25% of final 50' of trial using LiteGait.  See FIM for current functional status  Therapy/Group: Individual Therapy  Marsella Suman, Malva Cogan 08/28/2013, 8:56 PM

## 2013-08-29 ENCOUNTER — Inpatient Hospital Stay (HOSPITAL_COMMUNITY): Payer: Medicare Other | Admitting: Physical Therapy

## 2013-08-29 ENCOUNTER — Encounter (HOSPITAL_COMMUNITY): Payer: Medicare Other | Admitting: Occupational Therapy

## 2013-08-29 ENCOUNTER — Inpatient Hospital Stay (HOSPITAL_COMMUNITY): Payer: Medicare Other

## 2013-08-29 ENCOUNTER — Inpatient Hospital Stay (HOSPITAL_COMMUNITY): Payer: Medicare Other | Admitting: Speech Pathology

## 2013-08-29 DIAGNOSIS — I6330 Cerebral infarction due to thrombosis of unspecified cerebral artery: Secondary | ICD-10-CM

## 2013-08-29 DIAGNOSIS — Z5189 Encounter for other specified aftercare: Secondary | ICD-10-CM

## 2013-08-29 DIAGNOSIS — I10 Essential (primary) hypertension: Secondary | ICD-10-CM

## 2013-08-29 NOTE — Progress Notes (Signed)
Occupational Therapy Session Note  Patient Details  Name: [name] MRN: [id number] Date of Birth: September 09, 1950  Today's Date: 08/29/2013 Time: 0732-0830 Time Calculation (min): 58 min  Short Term Goals: Week 1:  OT Short Term Goal 1 (Week 1): Pt will complete bathing with mod assist of 1 caregiver at sit > stand level OT Short Term Goal 2 (Week 1): Pt will complete LB dressing with max assist of 1 caregiver at sit > stand level OT Short Term Goal 3 (Week 1): Pt will complete UB dressing at mod assist OT Short Term Goal 4 (Week 1): Pt will complete toilet transfer max assist of 1 caregiver OT Short Term Goal 5 (Week 1): Pt will complete 1/3 toileting tasks with max assist of 1 caregiver  Skilled Therapeutic Interventions/Progress Updates:    Engaged in ADL retraining with focus on proper placement of LUE during self-care tasks to increase postural stability and incorporation of LUE as able.  Stand pivot transfer bed > BSC with mod assist with pt requiring lifting assist initially from bed and physical assist to reposition LLE prior to and during transfer.  Supervision with postural stability during forward weight shift to complete perineal hygiene.  Dressing completed at sit> stand level at sink with placement of LUE on sink to improve standing posture and therapist on Lt to provide support at LLE and trunk while pt attempting to pull pants over hips. Pt with carryover of hemi-dressing technique with threading LUE first, requiring assist to complete task.  Educated on use of LUE as stabilizer during grooming tasks and setting up breakfast tray.  Therapy Documentation Precautions:  Precautions Precautions: Fall Precaution Comments: Lt hemiparesis Restrictions Weight Bearing Restrictions: No General:   Vital Signs: Therapy Vitals Temp: 98.2 F (36.8 C) Temp src: Oral Pulse Rate: 77 Resp: 19 BP: 126/80 mmHg Patient Position (if appropriate): Lying Oxygen Therapy SpO2: 97 % O2  Device: None (Room air) Pain:  Pt with no c/o pain  See FIM for current functional status  Therapy/Group: Individual Therapy  Simonne Come 08/29/2013, 8:33 AM

## 2013-08-29 NOTE — Progress Notes (Signed)
Subjective/Complaints: 63 y.o. right handed female with history of hypertension. Patient lives alone and was independent prior to admission. Admitted 08/23/2013 with left-sided weakness of acute onset after returning home from work as well as slurred speech. MRI showed acute infarction within the right posterior limb internal capsule/corona radiata. MRA of the head with no major occlusion or stenosis. Echocardiogram with ejection fraction of 06% grade 1 diastolic dysfunction. Carotid Dopplers with no ICA stenosis. Patient did not receive TPA. Neurology consulted placed on aspirin for CVA prophylaxis as well as enrollment in the Point trial study.   Pt states she sleeps poorly even at home Denies caffeine use in evening Usually takes nap after work in evening, worked PT housekeeping  Review of Systems - Negative except frequent urination no burning  Objective: Vital Signs: Blood pressure 126/80, pulse 77, temperature 98.2 F (36.8 C), temperature source Oral, resp. rate 19, height 5' 3"  (1.6 m), weight 110.5 kg (243 lb 9.7 oz), SpO2 97.00%. No results found. Results for orders placed during the hospital encounter of 08/26/13 (from the past 72 hour(s))  CBC WITH DIFFERENTIAL     Status: Abnormal   Collection Time    08/27/13  6:36 AM      Result Value Ref Range   WBC 5.2  4.0 - 10.5 K/uL   RBC 4.25  3.87 - 5.11 MIL/uL   Hemoglobin 12.3  12.0 - 15.0 g/dL   HCT 38.3  36.0 - 46.0 %   MCV 90.1  78.0 - 100.0 fL   MCH 28.9  26.0 - 34.0 pg   MCHC 32.1  30.0 - 36.0 g/dL   RDW 14.6  11.5 - 15.5 %   Platelets 220  150 - 400 K/uL   Neutrophils Relative % 30 (*) 43 - 77 %   Neutro Abs 1.6 (*) 1.7 - 7.7 K/uL   Lymphocytes Relative 53 (*) 12 - 46 %   Lymphs Abs 2.7  0.7 - 4.0 K/uL   Monocytes Relative 11  3 - 12 %   Monocytes Absolute 0.6  0.1 - 1.0 K/uL   Eosinophils Relative 6 (*) 0 - 5 %   Eosinophils Absolute 0.3  0.0 - 0.7 K/uL   Basophils Relative 0  0 - 1 %   Basophils Absolute 0.0  0.0 -  0.1 K/uL  COMPREHENSIVE METABOLIC PANEL     Status: Abnormal   Collection Time    08/27/13  6:36 AM      Result Value Ref Range   Sodium 142  137 - 147 mEq/L   Potassium 4.3  3.7 - 5.3 mEq/L   Chloride 103  96 - 112 mEq/L   CO2 28  19 - 32 mEq/L   Glucose, Bld 108 (*) 70 - 99 mg/dL   BUN 15  6 - 23 mg/dL   Creatinine, Ser 0.74  0.50 - 1.10 mg/dL   Calcium 8.9  8.4 - 10.5 mg/dL   Total Protein 7.3  6.0 - 8.3 g/dL   Albumin 3.4 (*) 3.5 - 5.2 g/dL   AST 14  0 - 37 U/L   ALT 23  0 - 35 U/L   Alkaline Phosphatase 67  39 - 117 U/L   Total Bilirubin 0.4  0.3 - 1.2 mg/dL   GFR calc non Af Amer 89 (*) >90 mL/min   GFR calc Af Amer >90  >90 mL/min   Comment: (NOTE)     The eGFR has been calculated using the CKD EPI equation.  This calculation has not been validated in all clinical situations.     eGFR's persistently <90 mL/min signify possible Chronic Kidney     Disease.   Anion gap 11  5 - 15     HEENT: normal Cardio: RRR Resp: CTA B/L GI: BS positive Extremity:  No Edema Skin:   Intact Neuro: Flat, Abnormal Motor 0/5 LUE, 1+ L HF, 0/5 L foot/ankle and Dysarthric Musc/Skel:  Normal, left shouer no increased tone, no pain with ext rotation, + pain with abduction Gen NAD   Assessment/Plan: 1. Functional deficits secondary to R PLIC infarct which require 3+ hours per day of interdisciplinary therapy in a comprehensive inpatient rehab setting. Physiatrist is providing close team supervision and 24 hour management of active medical problems listed below. Physiatrist and rehab team continue to assess barriers to discharge/monitor patient progress toward functional and medical goals. FIM: FIM - Bathing Bathing Steps Patient Completed: Chest;Left Arm;Abdomen;Front perineal area;Right upper leg;Left upper leg Bathing: 3: Mod-Patient completes 5-7 [id number] parts or 50-74%  FIM - Upper Body Dressing/Undressing Upper body dressing/undressing steps patient completed: Thread/unthread right  sleeve of pullover shirt/dresss;Put head through opening of pull over shirt/dress Upper body dressing/undressing: 3: Mod-Patient completed 50-74% of tasks FIM - Lower Body Dressing/Undressing Lower body dressing/undressing: 1: Total-Patient completed less than 25% of tasks  FIM - Toileting Toileting steps completed by patient: Performs perineal hygiene Toileting Assistive Devices: Grab bar or rail for support Toileting: 2: Max-Patient completed 1 of 3 steps  FIM - TRadio producerDevices: Grab bars Toilet Transfers: 3-To toilet/BSC: Mod A (lift or lower assist);3-From toilet/BSC: Mod A (lift or lower assist)  FIM - Bed/Chair Transfer Bed/Chair Transfer Assistive Devices: Bed rails;Arm rests Bed/Chair Transfer: 4: Chair or W/C > Bed: Min A (steadying Pt. > 75%);4: Bed > Chair or W/C: Min A (steadying Pt. > 75%)  FIM - Locomotion: Wheelchair Distance: 150 Locomotion: Wheelchair: 1: Total Assistance/staff pushes wheelchair (Pt<25%) FIM - Locomotion: Ambulation Locomotion: Ambulation Assistive Devices: Lite Gait Ambulation/Gait Assistance: 1: +1 Total assist Locomotion: Ambulation: 1: Travels 50 - 149 ft with total assistance/helper does all (Pt.< 25%)  Comprehension Comprehension Mode: Auditory Comprehension: 5-Understands basic 90% of the time/requires cueing < 10% of the time  Expression Expression Mode: Verbal Expression: 3-Expresses basic 50 - 74% of the time/requires cueing 25 - 50% of the time. Needs to repeat parts of sentences.  Social Interaction Social Interaction: 6-Interacts appropriately with others with medication or extra time (anti-anxiety, antidepressant).  Problem Solving Problem Solving: 5-Solves complex 90% of the time/cues < 10% of the time  Memory Memory: 5-Recognizes or recalls 90% of the time/requires cueing < 10% of the time  Medical Problem List and Plan:  1. Functional deficits secondary to right PLIC infarcts secondary  to small vessel disease  2. DVT Prophylaxis/Anticoagulation: SCDs. Monitor for any signs of DVT  3. Pain Management: Tylenol as needed  4. Hypertension. Lopressor 12.5 mg twice a day. Monitor with increased mobility  5. Neuropsych: This patient is capable of making decisions on her own behalf.  6. Hyperlipidemia. Lipitor   LOS (Days) 3 A FACE TO FACE EVALUATION WAS PERFORMED  KIRSTEINS,ANDREW E 08/29/2013, 6:47 AM

## 2013-08-29 NOTE — Progress Notes (Addendum)
Physical Therapy Session Note  Patient Details  Name: [name] MRN: [id number] Date of Birth: [birth year]  Today's Date: 08/30/2013 Time: 1023-1017 and 9629-5284 Time Calculation (min): 54 min and 52 min  Short Term Goals: Week 1:  PT Short Term Goal 1 (Week 1): Pt will perform supine>sit with with HOB flat using rail requiring min A and 25% cueing for sequencing. PT Short Term Goal 2 (Week 1): Pt will perform sit>supine with HOB flat requiring min A and 25% cueing for sequencing. PT Short Term Goal 3 (Week 1): Pt will perform bed<>chair transfer with max A and 50% cueing for technique and sequencing. PT Short Term Goal 4 (Week 1): Pt will perform w/c mobility x150' in controlled environment with supervision and 25% cueing for technique. PT Short Term Goal 5 (Week 1): Pt will perform gait x50' in controlled environment with Total A of single therapist and 75% cueing.  Skilled Therapeutic Interventions/Progress Updates:    Treatment Session 1: 1:1. Pt received seated in w/c; agreeable to session. Session focused on L NMR and gait training. Pt performed w/c mobility x130' in controlled environment with R hemi technique and supervision, increased time. See below for description of NMR performed in gym. Donned Ace bandage at L ankle for dorsiflexion assist. Performed gait x30' in controlled environment with RUE support at R hallway hand rail, mod A for stability, tactile cueing for lateral weight shift to R, verbal/tactile cueing for activation of L hip extensors for LLE stance stability. Pt then performed gait x20' with rolling walker and L hand orthosis requiring mod A and cueing as described for initial gait trial.  Pt performed multiple squat pivot transfers  with min A; multimodal cueing focused on setup (management of w/c arm rests), LLE positioning, and full anterior weight shift. Transitioned to stand pivot transfers (secondary to pt ability to both advance and stabilize LLE in standing),  with which pt required mod A, tactile cueing at L knee during sit>stand, manual facilitation and lateral weight shift (bilaterally) during pivoting. Removed Ace bandage. Session ended in pt room, where pt was left seated in w/c with all needs within reach.  Treatment Session 2: 1:1. Pt received seated in w/c; agreeable to therapy. Session focused on stair negotiation and trial of alternate w/c to improve fit and increase pt independence with w/c mobility and w/c parts management. Therapist retrieved hemi height 20"x18" w/c with brake lock extenders and L lap tray. Pt performed short-distance w/c propulsion trial to ensure pt able to effectively perform R hemi technique in w/c. Transported pt remaining distance to gym with total A for energy conservation. Donned Ace bandage at L ankle for dorsiflexion assist. Negotiated 3 stairs with R rail, forward-facing with step-to pattern requiring mod A. Removed Ace bandage. Pt left seated in w/c in pt room with L half lap tray on and all needs within reach.  Therapy Documentation Precautions:  Precautions Precautions: Fall Precaution Comments: Lt hemiparesis Restrictions Weight Bearing Restrictions: No Pain: Pain Assessment Pain Assessment: No/denies pain Locomotion : Ambulation Ambulation/Gait Assistance: 3: Mod assist Wheelchair Mobility Distance: 130  NMR: Neuromuscular Facilitation: Left;Lower Extremity;Activity to increase motor control;Activity to increase sustained activation;Activity to increase lateral weight shifting;Activity to increase anterior-posterior weight shifting. Standing with min A, manual facilitation at L ribcage and R posterior pelvis for upright posture, pt performed RUE anterolateral reaching for horseshoes followed by RUE reaching across midline to hang each horseshoe on shortened basketball rim. Activity/cueing focused on lateral weight shift to LLE.  See  FIM for current functional status  Therapy/Group: Individual  Therapy  Hobble, Malva Cogan 08/30/2013, 1:50 PM

## 2013-08-29 NOTE — Progress Notes (Signed)
Social Work [name], LCSW Social Worker Signed  Patient Care Conference Service date: 08/27/2013 3:22 PM  Inpatient RehabilitationTeam Conference and Plan of Care Update Date: 08/27/2013   Time: 11;20 AM     Patient Name: [name]       Medical Record Number: [id number]   Date of Birth: [birth year] Sex: Female         Room/Bed: 4M09C/4M09C-01 Payor Info: Payor: MEDICARE / Plan: MEDICARE PART A AND B / Product Type: *No Product type* /   Admitting Diagnosis: R CVA   Admit Date/Time:  08/26/2013  5:49 PM Admission Comments: No comment available   Primary Diagnosis:  <principal problem not specified> Principal Problem: <principal problem not specified>    Patient Active Problem List     Diagnosis  Date Noted   .  Chronic diastolic heart failure  98/33/8250   .  Dyslipidemia  08/26/2013   .  Borderline diabetes  08/26/2013   .  CVA (cerebral infarction)  08/26/2013   .  Acute CVA (cerebrovascular accident)  08/23/2013   .  Pre-syncope  08/17/2012   .  Hypokalemia  08/17/2012   .  ARTHRITIS  03/28/2010   .  DE QUERVAIN'S TENOSYNOVITIS  03/28/2010   .  Normocytic anemia  07/26/2009   .  REACTIVE AIRWAY DISEASE  03/09/2009   .  DENTAL CARIES  03/09/2009   .  CANDIDIASIS OF VULVA AND VAGINA  06/04/2008   .  SKIN TAG  06/04/2008   .  ARTHRITIS, KNEES, BILATERAL  05/22/2008   .  HYPERTENSION, BENIGN ESSENTIAL  05/04/2008   .  KNEE PAIN, BILATERAL  05/04/2008   .  COUGH  05/04/2008   .  MENISCUS TEAR, RIGHT  12/07/2005     Expected Discharge Date: Expected Discharge Date: 09/18/13  Team Members Present: Physician leading conference: Dr. Alysia Penna Social Worker Present: Ovidio Kin, LCSW Nurse Present: Elliot Cousin, RN PT Present: Billie Ruddy, PT;Caroline Lacinda Axon, PT OT Present: Simonne Come, OT SLP Present: Windell Moulding, SLP PPS Coordinator present : Ileana Ladd, Lelan Pons, RN, CRRN        Current Status/Progress  Goal  Weekly Team Focus   Medical     dysarthric, severe R HP  maximize functional recovery  Emphasis on emerging motor returen RLE   Bowel/Bladder     timed tolieting every 3 hours  cont B & B  timed tolieting   Swallow/Nutrition/ Hydration     wfl       ADL's     +2 transfers and LB self-care  supervision seated self-care, min assist LB and toileting  transfers, balance, LUE NMR   Mobility     +2A overall  Supervision transfers; Min A gait and stairs  basic transfers, bed mobility, standing, gait/stairs, L NMR, initiate patient/family education   Communication     min assist for speech intelligibility   mod I  educaiton and carryover of compensatory strategies    Safety/Cognition/ Behavioral Observations    min assist for higher level problem solving and memory   mod I   education and carryover of compensatory strategies    Pain     pain less than 3, monitor while here  no pain issues reported     Skin     monitor skin-no issues-none currently         *See Care Plan and progress notes for long and short-term goals.    Barriers to Discharge:  Functional level low  Possible Resolutions to Barriers:    cont rehab      Discharge Planning/Teaching Needs:    Home with daughter's assisting-aware of the alternative options-confirm with daughter's    Team Discussion:    Severe weakness on left side-OT reports moved arm this am.  New eval Should do well here-MD   Revisions to Treatment Plan:    New eval    Continued Need for Acute Rehabilitation Level of Care: The patient requires daily medical management by a physician with specialized training in physical medicine and rehabilitation for the following conditions: Daily direction of a multidisciplinary physical rehabilitation program to ensure safe treatment while eliciting the highest outcome that is of practical value to the patient.: Yes Daily medical management of patient stability for increased activity during participation in an intensive rehabilitation  regime.: Yes Daily analysis of laboratory values and/or radiology reports with any subsequent need for medication adjustment of medical intervention for : Neurological problems;Other  [name] 08/29/2013, 3:26 PM          Patient ID: [name], female   DOB: [birth year], 63 y.o.   MRN: [id number]

## 2013-08-29 NOTE — IPOC Note (Addendum)
Overall Plan of Care [facility]) Patient Details Name: [name] MRN: [id number] DOB: 11/24/1950  Admitting Diagnosis: R CVA  Hospital Problems: Active Problems:   CVA (cerebral infarction)     Functional Problem List: Nursing Bladder;Endurance;Medication Management;Motor;Safety  PT Balance;Motor;Sensory;Safety;Perception  OT Balance;Motor;Pain;Perception;Safety;Sensory  SLP    TR   Activity tolerance, functional mobility, balance,  safety,        Basic ADL's: OT Grooming;Bathing;Dressing;Toileting     Advanced  ADL's: OT Simple Meal Preparation     Transfers: PT Bed Mobility;Bed to Chair;Car;Furniture;Floor  OT Toilet;Tub/Shower     Locomotion: PT Ambulation;Wheelchair Mobility;Stairs     Additional Impairments: OT Fuctional Use of Upper Extremity  SLP Communication;Social Cognition expression Memory  TR      Anticipated Outcomes Item Anticipated Outcome  Self Feeding setup  Swallowing      Basic self-care  supervision UB, min assist LB  Toileting  min assist   Bathroom Transfers supervision  Bowel/Bladder  MOD I to manage bowel and bladder  Transfers  Supervision-Min A  Locomotion  Min A-Mod A  Communication  Mod I  Cognition  Mod I   Pain  Pain less than 2  Safety/Judgment  Pt will reamin safe while in the hospital   Therapy Plan: PT Intensity: Minimum of 1-2 x/day ,45 to 90 minutes PT Frequency: 5 out of 7 days PT Duration Estimated Length of Stay: 21-25 days OT Intensity: Minimum of 1-2 x/day, 45 to 90 minutes OT Frequency: 5 out of 7 days OT Duration/Estimated Length of Stay: 20-25 days SLP Intensity: Minumum of 1-2 x/day, 30 to 90 minutes SLP Frequency: 5 out of 7 days SLP Duration/Estimated Length of Stay: D/C 09/18/13 from CIR but may D/C from SLP caseload earlier depending on progress  TR Duration/ELOS:  2 weeks TR Frequency:  Min 1 time per week >20 minutes      Team Interventions: Nursing Interventions Patient/Family  Education;Medication Management;Pain Management;Disease Management/Prevention;Skin Care/Wound Management;Bladder Management  PT interventions Ambulation/gait training;Balance/vestibular training;Cognitive remediation/compensation;Discharge planning;Neuromuscular re-education;Functional mobility training;DME/adaptive equipment instruction;Disease management/prevention;Patient/family education;Splinting/orthotics;UE/LE Strength taining/ROM;Therapeutic Exercise;Wheelchair propulsion/positioning;Stair training;Therapeutic Activities  OT Interventions Balance/vestibular training;Discharge planning;Disease mangement/prevention;DME/adaptive equipment instruction;Functional mobility training;Neuromuscular re-education;Pain management;Patient/family education;Psychosocial support;Self Care/advanced ADL retraining;Therapeutic Activities;Therapeutic Exercise;UE/LE Strength taining/ROM;UE/LE Coordination activities  SLP Interventions Cognitive remediation/compensation;Cueing hierarchy;Functional tasks;Internal/external aids;Environmental controls;Oral motor exercises;Speech/Language facilitation;Patient/family education;Therapeutic Activities  TR Interventions Recreation/leisure participation, Balance/Vestibular training, functional mobility, therapeutic activities, UE/LE strength/coordination, w/c mobility, community reintegration, pt/family education, adaptive equipment instruction/use, discharge planning, psychosocial support  SW/CM Interventions Discharge Planning;Psychosocial Support;Patient/Family Education    Team Discharge Planning: Destination: PT-Home ,OT- Home , SLP-Home Projected Follow-up: PT-Home health PT;24 hour supervision/assistance, OT-  Home health OT;24 hour supervision/assistance, SLP-None Projected Equipment Needs: PT-To be determined, OT- 3 in 1 bedside comode;Tub/shower bench, SLP-None recommended by SLP Equipment Details: PT- , OT-  Patient/family involved in discharge planning: PT-  Patient,  OT-Patient, SLP-Patient  MD ELOS: ELOS: 20-25 days   Medical Rehab Prognosis:  Good Assessment: 63 y.o. right handed female with history of hypertension. Patient lives alone and was independent prior to admission. Admitted 08/23/2013 with left-sided weakness of acute onset after returning home from work as well as slurred speech. MRI showed acute infarction within the right posterior limb internal capsule/corona radiata. MRA of the head with no major occlusion or stenosis. Echocardiogram with ejection fraction of 36% grade 1 diastolic dysfunction. Carotid Dopplers with no ICA stenosis. Patient did not receive TPA. Neurology consulted placed on aspirin for CVA prophylaxis as well as enrollment in the Point trial study.  Now requiring 24/7 Rehab RN,MD, as well as CIR level PT, OT and SLP.  Treatment team will focus on ADLs and mobility with goals set at MinA/Sup  See Team Conference Notes for weekly updates to the plan of care

## 2013-08-29 NOTE — Progress Notes (Signed)
Speech Language Pathology Daily Session Note  Patient Details  Name: [name] MRN: [id number] Date of Birth: 28-Sep-1950  Today's Date: 08/29/2013 Time: 6387-5643 Time Calculation (min): 26 min  Short Term Goals: Week 1: SLP Short Term Goal 1 (Week 1): Pt will utilize speech intelligibility strategies (slow rate, increased vocal intensity) at the sentence level with Min A multimodal cues.  SLP Short Term Goal 2 (Week 1): Pt will utilize diaphragmatic breathing at the phrase level with Min A multimodal cues.  SLP Short Term Goal 3 (Week 1): Pt will recall current medications with Mod I. SLP Short Term Goal 4 (Week 1): Pt will demonstrate appropriate medication management with use of a pill box with Mod I.   Skilled Therapeutic Interventions:  Pt was seen for skilled speech therapy targeting speech intelligibility and diaphragmatic breathing.  SLP initiated skilled dysarthria education with an emphasis on slow rate, loud voice, and overarticulation to maximize speech intelligibility during functional conversations.  SLP further facilitated session with a generative naming task targeting use of strategies during structured tasks with pt able to return demonstration of overarticulation with supervision cues for 90% intelligibility.  In conversation, pt was noted with decreased breath support with resultant decreased vocal intensity of which pt was aware.  SLP provided pt with skilled education related to diaphragmatic breathing to improve vocal intensity and overall speech intelligibility with pt able to return demonstration of deep belly breathing following initial instructional cues.  Continue per current plan of care.    FIM:  Comprehension Comprehension Mode: Auditory Comprehension: 5-Follows basic conversation/direction: With extra time/assistive device Expression Expression Mode: Verbal Expression: 3-Expresses basic 50 - 74% of the time/requires cueing 25 - 50% of the time. Needs to  repeat parts of sentences. Social Interaction Social Interaction: 6-Interacts appropriately with others with medication or extra time (anti-anxiety, antidepressant). Problem Solving Problem Solving: 5-Solves complex 90% of the time/cues < 10% of the time Memory Memory: 5-Recognizes or recalls 90% of the time/requires cueing < 10% of the time  Pain Pain Assessment Pain Assessment: No/denies pain  Therapy/Group: Individual Therapy  Windell Moulding, M.A. CCC-SLP  Sreekar Broyhill, Selinda Orion 08/29/2013, 4:21 PM

## 2013-08-30 ENCOUNTER — Encounter (HOSPITAL_COMMUNITY): Payer: Medicare Other | Admitting: *Deleted

## 2013-08-30 ENCOUNTER — Inpatient Hospital Stay (HOSPITAL_COMMUNITY): Payer: Medicare Other | Admitting: Physical Therapy

## 2013-08-30 ENCOUNTER — Inpatient Hospital Stay (HOSPITAL_COMMUNITY): Payer: Medicare Other | Admitting: Speech Pathology

## 2013-08-30 ENCOUNTER — Inpatient Hospital Stay (HOSPITAL_COMMUNITY): Payer: Medicare Other | Admitting: *Deleted

## 2013-08-30 NOTE — Progress Notes (Signed)
Subjective/Complaints: 63 y.o. right handed female with history of hypertension. Patient lives alone and was independent prior to admission. Admitted 08/23/2013 with left-sided weakness of acute onset after returning home from work as well as slurred speech. MRI showed acute infarction within the right posterior limb internal capsule/corona radiata. MRA of the head with no major occlusion or stenosis. Echocardiogram with ejection fraction of 25% grade 1 diastolic dysfunction. Carotid Dopplers with no ICA stenosis. Patient did not receive TPA. Neurology consulted placed on aspirin for CVA prophylaxis as well as enrollment in the Point trial study.   Feels like she needs to take short breaths no cough no pain with insp Usually takes nap after work in evening, worked [employer]  Review of Systems - Negative except frequent urination no burning  Objective: Vital Signs: Blood pressure 118/73, pulse 69, temperature 97.9 F (36.6 C), temperature source Oral, resp. rate 19, height 5\' 3"  (1.6 m), weight 110.5 kg (243 lb 9.7 oz), SpO2 95.00%. No results found. No results found for this or any previous visit (from the past 72 hour(s)).   HEENT: normal Cardio: RRR Resp: CTA B/L GI: BS positive Extremity:  No Edema Skin:   Intact Neuro: Flat, Abnormal Motor 0/5 LUE, 1+ L HF, 0/5 L foot/ankle and Dysarthric Musc/Skel:  Normal, left shoulder no increased tone, no pain with ext rotation, + pain with abduction Gen NAD   Assessment/Plan: 1. Functional deficits secondary to R PLIC infarct which require 3+ hours per day of interdisciplinary therapy in a comprehensive inpatient rehab setting. Physiatrist is providing close team supervision and 24 hour management of active medical problems listed below. Physiatrist and rehab team continue to assess barriers to discharge/monitor patient progress toward functional and medical goals. FIM: FIM - Bathing Bathing Steps Patient Completed: Chest;Left  Arm;Abdomen;Front perineal area;Right upper leg;Left upper leg Bathing: 3: Mod-Patient completes 5-7 72f 10 parts or 50-74%  FIM - Upper Body Dressing/Undressing Upper body dressing/undressing steps patient completed: Thread/unthread right sleeve of pullover shirt/dresss;Put head through opening of pull over shirt/dress Upper body dressing/undressing: 3: Mod-Patient completed 50-74% of tasks FIM - Lower Body Dressing/Undressing Lower body dressing/undressing: 1: Total-Patient completed less than 25% of tasks  FIM - Toileting Toileting steps completed by patient: Performs perineal hygiene Toileting Assistive Devices: Grab bar or rail for support Toileting: 2: Max-Patient completed 1 of 3 steps  FIM - Radio producer Devices: Grab bars Toilet Transfers: 3-To toilet/BSC: Mod A (lift or lower assist);3-From toilet/BSC: Mod A (lift or lower assist)  FIM - Bed/Chair Transfer Bed/Chair Transfer Assistive Devices: Bed rails;Arm rests Bed/Chair Transfer: 3: Bed > Chair or W/C: Mod A (lift or lower assist);3: Chair or W/C > Bed: Mod A (lift or lower assist)  FIM - Locomotion: Wheelchair Distance: 130 Locomotion: Wheelchair: 2: Travels 50 - 149 ft with supervision, cueing or coaxing FIM - Locomotion: Ambulation Locomotion: Ambulation Assistive Devices: Other (comment);Orthosis;Walker - Rolling (L hand orthosis; ACE bandage at LLE for dorsiflexion assist) Ambulation/Gait Assistance: 3: Mod assist Locomotion: Ambulation: 1: Travels less than 50 ft with moderate assistance (Pt: 50 - 74%)  Comprehension Comprehension Mode: Auditory Comprehension: 5-Follows basic conversation/direction: With extra time/assistive device  Expression Expression Mode: Verbal Expression: 3-Expresses basic 50 - 74% of the time/requires cueing 25 - 50% of the time. Needs to repeat parts of sentences.  Social Interaction Social Interaction: 6-Interacts appropriately with others with  medication or extra time (anti-anxiety, antidepressant).  Problem Solving Problem Solving: 5-Solves complex 90% of the time/cues < 10%  of the time  Memory Memory: 5-Recognizes or recalls 90% of the time/requires cueing < 10% of the time  Medical Problem List and Plan:  1. Functional deficits secondary to right PLIC infarcts secondary to small vessel disease  2. DVT Prophylaxis/Anticoagulation: SCDs. Monitor for any signs of DVT  3. Pain Management: Tylenol as needed  4. Hypertension. Lopressor 12.5 mg twice a day. Monitor with increased mobility  5. Neuropsych: This patient is capable of making decisions on her own behalf.  6. Hyperlipidemia. Lipitor   LOS (Days) 4 A FACE TO FACE EVALUATION WAS PERFORMED  Meagan Spease E 08/30/2013, 7:44 AM

## 2013-08-30 NOTE — Progress Notes (Signed)
Physical Therapy Session Note  Patient Details  Name: [name] MRN: [id number] Date of Birth: 26-Jan-1951  Today's Date: 08/30/2013  Short Term Goals: Week 1:  PT Short Term Goal 1 (Week 1): Pt will perform supine>sit with with HOB flat using rail requiring min A and 25% cueing for sequencing. PT Short Term Goal 2 (Week 1): Pt will perform sit>supine with HOB flat requiring min A and 25% cueing for sequencing. PT Short Term Goal 3 (Week 1): Pt will perform bed<>chair transfer with max A and 50% cueing for technique and sequencing. PT Short Term Goal 4 (Week 1): Pt will perform w/c mobility x150' in controlled environment with supervision and 25% cueing for technique. PT Short Term Goal 5 (Week 1): Pt will perform gait x50' in controlled environment with Total A of single therapist and 75% cueing.  Skilled Therapeutic Interventions/Progress Updates:  Time: 0900-0958 Time Calculation (min): 58 min   Session initiated with w/c propulsion utilizing hemi-technique x 50' with min assist for counter steering 25% cues. Performed Lt LE weight bearing and Lt hip/knee stability strengthening during single limb stance with Rt step ups to 4" step (4 x 10 rep), one rail and one HHA and min mod assist. Facilitation of Lt glutes activation. Mini-squats for quad control and equal weight bearing through bil LEs (3 x 10 rep). Scoot transfers Rt/Lt 2x each direction with min physical assist, min w/c set-up cues, assist for brakes and leg rest and repositioning Lt LE.   Ambulation with wall railing 45' with heavy min assist and w/c to follow, Lt ACE wrap dorsiflexion assist. Ambulation with RW, Lt hand splint, and Lt ACE wrap dorsiflexion assist x 20' with mod assist with progressed to heavy mod with fatigue, pt requires stabilization, repositioning, and advancement of RW. Pt left seated in w/c with all needs in reach.   Second Skilled Therapeutic Interventions/Progress Updates:  Time: 4098-1191 Time Calculation  (min): 42 min Stand pivot transfer recliner>w/c<>bedside commode all with min assist and min cues for attention to Lt extremities placements. Standing balance without UE support during perineal cleaning - min assist for balance. Squat pivot transfers to/from elevated mat performed with mod assist - pt with posterior upper trunk weight shift once requiring assist prevent forward sliding of pelvis pt educated on importance of head/hips relationship and decreasing risk for falls. Supine TE for Lt hip stabilizers (2x10reps each): heel slides, eccentric straight leg raise, hip abduction, bridge with adductor squeeze. Ambulation x 40' with railing on wall cues for weight shift and improved Lt LE clearance, min assist with chair to follow, increased difficulty with Lt foot clearance due to fatigue.   Therapy Documentation Precautions:  Precautions Precautions: Fall Precaution Comments: Lt hemiparesis Restrictions Weight Bearing Restrictions: No Pain:  no c/o either session   See FIM for current functional status  Therapy/Group: Individual Therapy both sessions  Lahoma Rocker 08/30/2013, 12:24 PM

## 2013-08-30 NOTE — Progress Notes (Signed)
Speech Language Pathology Daily Session Note  Patient Details  Name: [name] MRN: [id number] Date of Birth: [birth year]  Today's Date: 08/30/2013 Time: 1400-1440 Time Calculation (min): 40 min  Short Term Goals: Week 1: SLP Short Term Goal 1 (Week 1): Pt will utilize speech intelligibility strategies (slow rate, increased vocal intensity) at the sentence level with Min A multimodal cues.  SLP Short Term Goal 2 (Week 1): Pt will utilize diaphragmatic breathing at the phrase level with Min A multimodal cues.  SLP Short Term Goal 3 (Week 1): Pt will recall current medications with Mod I. SLP Short Term Goal 4 (Week 1): Pt will demonstrate appropriate medication management with use of a pill box with Mod I.   Skilled Therapeutic Interventions: Skilled St intervention provided with focus on cognitive and speech-lang goals. Pt completed med management tasks with use of pill organizer with 100% accuracy, min A. Slp provided visual model of correct use and educated pt on benefits of using organizer at home, expressed understanding. SLP reviewed comp speech intelligibility strategies with pt. Pt practiced using strategies at sentence level, min A. OME performed x10 each.    FIM:  Comprehension Comprehension Mode: Auditory Comprehension: 5-Follows basic conversation/direction: With extra time/assistive device Expression Expression: 3-Expresses basic 50 - 74% of the time/requires cueing 25 - 50% of the time. Needs to repeat parts of sentences. Social Interaction Social Interaction: 6-Interacts appropriately with others with medication or extra time (anti-anxiety, antidepressant). Problem Solving Problem Solving: 5-Solves complex 90% of the time/cues < 10% of the time Memory Memory: 5-Recognizes or recalls 90% of the time/requires cueing < 10% of the time  Pain Pain Assessment Pain Assessment: No/denies pain  Therapy/Group: Individual Therapy  Aeryn Medici, Bernerd Pho 08/30/2013, 2:45 PM

## 2013-08-31 ENCOUNTER — Inpatient Hospital Stay (HOSPITAL_COMMUNITY): Payer: Medicare Other | Admitting: *Deleted

## 2013-08-31 ENCOUNTER — Inpatient Hospital Stay (HOSPITAL_COMMUNITY): Payer: Medicare Other

## 2013-08-31 ENCOUNTER — Inpatient Hospital Stay (HOSPITAL_COMMUNITY): Payer: Medicare Other | Admitting: Physical Therapy

## 2013-08-31 NOTE — Progress Notes (Signed)
Subjective/Complaints: 63 y.o. right handed female with history of hypertension. Patient lives alone and was independent prior to admission. Admitted 08/23/2013 with left-sided weakness of acute onset after returning home from work as well as slurred speech. MRI showed acute infarction within the right posterior limb internal capsule/corona radiata. MRA of the head with no major occlusion or stenosis. Echocardiogram with ejection fraction of 46% grade 1 diastolic dysfunction. Carotid Dopplers with no ICA stenosis. Patient did not receive TPA. Neurology consulted placed on aspirin for CVA prophylaxis as well as enrollment in the Point trial study.   Slept ok, LUE and LLE felt cold yesterday  Review of Systems - Negative except frequent urination no burning  Objective: Vital Signs: Blood pressure 109/71, pulse 69, temperature 98 F (36.7 C), temperature source Oral, resp. rate 18, height 5\' 3"  (1.6 m), weight 110.5 kg (243 lb 9.7 oz), SpO2 98.00%. No results found. No results found for this or any previous visit (from the past 72 hour(s)).   HEENT: normal Cardio: RRR Resp: CTA B/L GI: BS positive Extremity:  No Edema Skin:   Intact Neuro: Flat, Abnormal Motor 0/5 LUE, 1+ L HF, 0/5 L foot/ankle , 3- Knee ext and Dysarthric Musc/Skel:  Normal, left shoulder no increased tone, no pain with ext rotation, + pain with abduction Gen NAD   Assessment/Plan: 1. Functional deficits secondary to R PLIC infarct which require 3+ hours per day of interdisciplinary therapy in a comprehensive inpatient rehab setting. Physiatrist is providing close team supervision and 24 hour management of active medical problems listed below. Physiatrist and rehab team continue to assess barriers to discharge/monitor patient progress toward functional and medical goals. FIM: FIM - Bathing Bathing Steps Patient Completed: Chest;Left Arm;Abdomen;Front perineal area;Right upper leg;Left upper leg Bathing: 3: Mod-Patient  completes 5-7 84f 10 parts or 50-74%  FIM - Upper Body Dressing/Undressing Upper body dressing/undressing steps patient completed: Thread/unthread right sleeve of pullover shirt/dresss;Put head through opening of pull over shirt/dress Upper body dressing/undressing: 3: Mod-Patient completed 50-74% of tasks FIM - Lower Body Dressing/Undressing Lower body dressing/undressing: 1: Total-Patient completed less than 25% of tasks  FIM - Toileting Toileting steps completed by patient: Performs perineal hygiene Toileting Assistive Devices: Grab bar or rail for support Toileting: 2: Max-Patient completed 1 of 3 steps  FIM - Radio producer Devices: Product manager Transfers: 1-Two helpers  FIM - Control and instrumentation engineer Devices: Arm rests Bed/Chair Transfer: 1: Two helpers  FIM - Locomotion: Wheelchair Distance: 130 Locomotion: Wheelchair: 2: Travels 51 - 149 ft with supervision, cueing or coaxing FIM - Locomotion: Ambulation Locomotion: Ambulation Assistive Devices: Other (comment);Orthosis;Walker - Rolling Ambulation/Gait Assistance: 3: Mod assist Locomotion: Ambulation: 1: Travels less than 50 ft with moderate assistance (Pt: 50 - 74%)  Comprehension Comprehension Mode: Auditory Comprehension: 5-Follows basic conversation/direction: With extra time/assistive device  Expression Expression Mode: Verbal Expression: 3-Expresses basic 50 - 74% of the time/requires cueing 25 - 50% of the time. Needs to repeat parts of sentences.  Social Interaction Social Interaction: 6-Interacts appropriately with others with medication or extra time (anti-anxiety, antidepressant).  Problem Solving Problem Solving: 5-Solves basic 90% of the time/requires cueing < 10% of the time  Memory Memory: 5-Recognizes or recalls 90% of the time/requires cueing < 10% of the time  Medical Problem List and Plan:  1. Functional deficits secondary to right PLIC  infarcts secondary to small vessel disease  2. DVT Prophylaxis/Anticoagulation: SCDs. Monitor for any signs of DVT  3. Pain Management: Tylenol  as needed  4. Hypertension. Lopressor 12.5 mg twice a day. Monitor with increased mobility  5. Neuropsych: This patient is capable of making decisions on her own behalf.  6. Hyperlipidemia. Lipitor   LOS (Days) 5 A FACE TO FACE EVALUATION WAS PERFORMED  Saraih Lorton E 08/31/2013, 8:15 AM

## 2013-08-31 NOTE — Progress Notes (Signed)
Occupational Therapy Session Note  Patient Details  Name: [name] MRN: [id number] Date of Birth: 1951-01-06  Today's Date: 08/31/2013 Time: -  1115-1210   1st session Time Calculation (min): 55 min  Short Term Goals: Week 1:  OT Short Term Goal 1 (Week 1): Pt will complete bathing with mod assist of 1 caregiver at sit > stand level OT Short Term Goal 2 (Week 1): Pt will complete LB dressing with max assist of 1 caregiver at sit > stand level OT Short Term Goal 3 (Week 1): Pt will complete UB dressing at mod assist OT Short Term Goal 4 (Week 1): Pt will complete toilet transfer max assist of 1 caregiver OT Short Term Goal 5 (Week 1): Pt will complete 1/3 toileting tasks with max assist of 1 caregiver Week 2:     Skilled Therapeutic Interventions/Progress Updates:      1st session:  Pt. Sitting in wc upon OT arrival.  Pt agreed to shower in shower stall.  Addressed wc mobility, sit to stand, standing balance.  LUE/LLE  NMRE during functional tasks. Transfers.  Pt. Propelled wc to bathroom with mod assist.  Transferred to tub bench with min assist.  Pt able to perform side stepping to left with mod assist.  Used LUE with bathing with minimal assist. Pt. Dressed in wc at sink with max assist LB.  Stood to assist with applying depend with min assist (static balance).  Pt decreased initiating of conversation, but answered questions.  Pt. Stood at sink to don pants with minimal assist and cues to use RUE.  Left in room in wc with call bell and lunch tray.  Needed assist with opening packages.    Therapy Documentation Precautions:  Precautions Precautions: Fall Precaution Comments: Lt hemiparesis Restrictions Weight Bearing Restrictions: No      Pain: Pain Assessment Pain Assessment: No/denies pain Pain Score: 0-No pain    Other Treatments:    2nd session:  Time:1645 -1730  (45 min) Pain:  none Individual session Addressed wc mobility, LUE NMRE.  Pt. Propelled wc to gym with  minimal assist for staying straight.  Performed weight bearing activities, AAROM ih the LUE.  Pt had trace movement with shoulder elevation, protraction, retraction, elbow extension, forearm pronation and light weight bearing.  Educated pt on LUE retrograde massage, and AAROM.  Performed teach back method and pr returned exercises with no cues.  Pt propelled self back to room and left with family member in room.    See FIM for current functional status  Therapy/Group: Individual Therapy  Lisa Roca 08/31/2013, 4:32 PM

## 2013-08-31 NOTE — Progress Notes (Signed)
Physical Therapy Session Note  Patient Details  Name: [name] MRN: [id number] Date of Birth: 09-26-1950  Today's Date: 08/31/2013 Time: 1740-8144 Time Calculation (min): 43 min  Short Term Goals: Week 1:  PT Short Term Goal 1 (Week 1): Pt will perform supine>sit with with HOB flat using rail requiring min A and 25% cueing for sequencing. PT Short Term Goal 2 (Week 1): Pt will perform sit>supine with HOB flat requiring min A and 25% cueing for sequencing. PT Short Term Goal 3 (Week 1): Pt will perform bed<>chair transfer with max A and 50% cueing for technique and sequencing. PT Short Term Goal 4 (Week 1): Pt will perform w/c mobility x150' in controlled environment with supervision and 25% cueing for technique. PT Short Term Goal 5 (Week 1): Pt will perform gait x50' in controlled environment with Total A of single therapist and 75% cueing.  Skilled Therapeutic Interventions/Progress Updates:    Pt received seated in w/c w/ family present, agreeable to participate in therapy. Pt propelled w/c w/ S 150' to rehab gym. In controlled environment pt ambulated 10' w/ LBQC w/ MinA. Pt negotiated up/down 3 stairs w/ rail on R going up w/ ModA for foot placement and weight shifting, no VC's needed for sequencing. Seated EOM pt performed sit<> stands w/ block under RLE for LLE NMR, pt req. Min-ModA for stands w/ RUE HHA. Pt performed foot taps in standing w/ R foot and L foot for coordination and NMR for LLE w/ ModA to maintain standing. Pt negotiated up/down 3 stairs w/ rail on R going up w/ MinA, then propelled w/c back to room w/ S utilizing hemi-technique. Pt left seated in w/c w/ family present w/ all needs within reach.  Therapy Documentation Precautions:  Precautions Precautions: Fall Precaution Comments: Lt hemiparesis Restrictions Weight Bearing Restrictions: No Vital Signs: Therapy Vitals Temp: 97.9 F (36.6 C) Temp src: Oral Pulse Rate: 81 Resp: 19 BP: 113/75 mmHg Patient  Position (if appropriate): Sitting Oxygen Therapy SpO2: 97 % O2 Device: None (Room air) Pain: Pain Assessment Pain Assessment: No/denies pain Pain Score: 0-No pain Locomotion : Ambulation Ambulation/Gait Assistance: 4: Min guard   See FIM for current functional status  Therapy/Group: Individual Therapy  Rada Hay Rada Hay, PT, DPT 08/31/2013, 3:45 PM

## 2013-08-31 NOTE — Progress Notes (Signed)
Physical Therapy Session Note  Patient Details  Name: [name] MRN: [id number] Date of Birth: 17-Jan-1951  Today's Date: 08/31/2013 Time: 1000-1100 Time Calculation (min): 60 min  Skilled Therapeutic Interventions/Progress Updates:  Patient in the room , sitting in a w/c w/o complains of pain or discomfort, agrees to therapy. Transfer to bed side commode with mod A and mod A for managing of LB dressing.  Patient propelled herself to/from the room to gym with min A for maintaining straight path and reduce L side pull. Gait training with R side rail and min A, manual facilitation of L Knee extension and weight shifting x 2 25 feet , 1 x 25 feet with WBQC with mod A and 25% of assistance needed to move L LE forward.  Step ups on 6 inch step with R LE to increase ability to weight bear through L side 2x10.  Marching in place with L LE with mod A to increase balance and tactile cues to activate muscles of the hip and gluts.  Patient returned to room with all needs within reach.   Therapy Documentation Precautions:  Precautions Precautions: Fall Precaution Comments: Lt hemiparesis Restrictions Weight Bearing Restrictions: No Pain: Pain Assessment Pain Assessment: No/denies pain  See FIM for current functional status  Therapy/Group: Individual Therapy  [name] 08/31/2013, 11:01 AM

## 2013-09-01 ENCOUNTER — Inpatient Hospital Stay (HOSPITAL_COMMUNITY): Payer: Medicare Other | Admitting: Speech Pathology

## 2013-09-01 ENCOUNTER — Encounter (HOSPITAL_COMMUNITY): Payer: Medicare Other | Admitting: Occupational Therapy

## 2013-09-01 ENCOUNTER — Inpatient Hospital Stay (HOSPITAL_COMMUNITY): Payer: Medicare Other | Admitting: Physical Therapy

## 2013-09-01 DIAGNOSIS — I10 Essential (primary) hypertension: Secondary | ICD-10-CM

## 2013-09-01 DIAGNOSIS — Z5189 Encounter for other specified aftercare: Secondary | ICD-10-CM

## 2013-09-01 DIAGNOSIS — I6330 Cerebral infarction due to thrombosis of unspecified cerebral artery: Secondary | ICD-10-CM

## 2013-09-01 NOTE — Progress Notes (Signed)
Subjective/Complaints: 63 y.o. right handed female with history of hypertension. Patient lives alone and was independent prior to admission. Admitted 08/23/2013 with left-sided weakness of acute onset after returning home from work as well as slurred speech. MRI showed acute infarction within the right posterior limb internal capsule/corona radiata. MRA of the head with no major occlusion or stenosis. Echocardiogram with ejection fraction of 38% grade 1 diastolic dysfunction. Carotid Dopplers with no ICA stenosis. Patient did not receive TPA. Neurology consulted placed on aspirin for CVA prophylaxis as well as enrollment in the Point trial study.     Review of Systems - Negative except frequent urination no burning  Objective: Vital Signs: Blood pressure 118/68, pulse 70, temperature 98.1 F (36.7 C), temperature source Oral, resp. rate 18, height 5\' 3"  (1.6 m), weight 110.5 kg (243 lb 9.7 oz), SpO2 97.00%. No results found. No results found for this or any previous visit (from the past 72 hour(s)).   HEENT: normal Cardio: RRR Resp: CTA B/L GI: BS positive Extremity:  No Edema Skin:   Intact Neuro: Flat, Abnormal Motor 0/5 LUE, 1+ L HF, 0/5 L foot/ankle , 3- Knee ext with Hip ext synergy  and Dysarthric Musc/Skel:  Normal, left shoulder no increased tone, no pain with ext rotation, + pain with abduction Gen NAD   Assessment/Plan: 1. Functional deficits secondary to R PLIC infarct which require 3+ hours per day of interdisciplinary therapy in a comprehensive inpatient rehab setting. Physiatrist is providing close team supervision and 24 hour management of active medical problems listed below. Physiatrist and rehab team continue to assess barriers to discharge/monitor patient progress toward functional and medical goals. FIM: FIM - Bathing Bathing Steps Patient Completed: Chest;Left Arm;Abdomen;Front perineal area;Right upper leg;Left upper leg Bathing: 3: Mod-Patient completes 5-7 11f  10 parts or 50-74%  FIM - Upper Body Dressing/Undressing Upper body dressing/undressing steps patient completed: Thread/unthread right sleeve of pullover shirt/dresss;Put head through opening of pull over shirt/dress Upper body dressing/undressing: 3: Mod-Patient completed 50-74% of tasks FIM - Lower Body Dressing/Undressing Lower body dressing/undressing: 1: Total-Patient completed less than 25% of tasks  FIM - Toileting Toileting steps completed by patient: Performs perineal hygiene Toileting Assistive Devices: Grab bar or rail for support Toileting: 2: Max-Patient completed 1 of 3 steps  FIM - Radio producer Devices: Product manager Transfers: 1-Two helpers  FIM - Control and instrumentation engineer Devices: Arm rests Bed/Chair Transfer: 3: Chair or W/C > Bed: Mod A (lift or lower assist);3: Sit > Supine: Mod A (lifting assist/Pt. 50-74%/lift 2 legs)  FIM - Locomotion: Wheelchair Distance: 130 Locomotion: Wheelchair: 5: Travels 150 ft or more: maneuvers on rugs and over door sills with supervision, cueing or coaxing FIM - Locomotion: Ambulation Locomotion: Ambulation Assistive Devices: Nurse, adult Ambulation/Gait Assistance: 4: Min guard Locomotion: Ambulation: 1: Travels less than 50 ft with minimal assistance (Pt.>75%)  Comprehension Comprehension Mode: Auditory Comprehension: 5-Follows basic conversation/direction: With extra time/assistive device  Expression Expression Mode: Verbal Expression: 4-Expresses basic 75 - 89% of the time/requires cueing 10 - 24% of the time. Needs helper to occlude trach/needs to repeat words.  Social Interaction Social Interaction: 6-Interacts appropriately with others with medication or extra time (anti-anxiety, antidepressant).  Problem Solving Problem Solving: 5-Solves basic 90% of the time/requires cueing < 10% of the time  Memory Memory: 5-Recognizes or recalls 90% of the time/requires  cueing < 10% of the time  Medical Problem List and Plan:  1. Functional deficits secondary to right PLIC  infarcts secondary to small vessel disease  2. DVT Prophylaxis/Anticoagulation: SCDs. Monitor for any signs of DVT  3. Pain Management: Tylenol as needed  4. Hypertension. Lopressor 12.5 mg twice a day. Monitor with increased mobility  5. Neuropsych: This patient is capable of making decisions on her own behalf.  6. Hyperlipidemia. Lipitor   LOS (Days) 6 A FACE TO FACE EVALUATION WAS PERFORMED  [name] E 09/01/2013, 7:01 AM

## 2013-09-01 NOTE — Progress Notes (Signed)
Speech Language Pathology Daily Session Note  Patient Details  Name: [name] MRN: [id number] Date of Birth: [birth year]  Today's Date: 09/01/2013 Time: 0092-3300 Time Calculation (min): 60 min  Short Term Goals: Week 1: SLP Short Term Goal 1 (Week 1): Pt will utilize speech intelligibility strategies (slow rate, increased vocal intensity) at the sentence level with Min A multimodal cues.  SLP Short Term Goal 2 (Week 1): Pt will utilize diaphragmatic breathing at the phrase level with Min A multimodal cues.  SLP Short Term Goal 3 (Week 1): Pt will recall current medications with Mod I. SLP Short Term Goal 4 (Week 1): Pt will demonstrate appropriate medication management with use of a pill box with Mod I.   Skilled Therapeutic Interventions: Skilled treatment session focused on speech goals. Upon arrival, pt was sitting up in her wheelchair and recalled her speech intelligibility strategies with Mod I. SLP facilitated session by initially providing Max A multimodal cues for utilization of diaphragmatic breathing while seated upright which faded to Min A by end of session at the phrase level.  Pt was able to demonstrate increased vocal intensity and overall breath support with the use of diaphragmatic breathing during a picture description task which increased her overall speech intelligibility to ~90%. Pt also demonstrated increased emergent awareness of speech errors and required supervision verbal and question cues to self-correct. Pt propelled her wheelchair to and from the SLP office with extra time. Continue with current plan of care.    FIM:  Comprehension Comprehension Mode: Auditory Comprehension: 5-Follows basic conversation/direction: With extra time/assistive device Expression Expression Mode: Verbal Expression: 4-Expresses basic 75 - 89% of the time/requires cueing 10 - 24% of the time. Needs helper to occlude trach/needs to repeat words. Social Interaction Social Interaction:  6-Interacts appropriately with others with medication or extra time (anti-anxiety, antidepressant). Problem Solving Problem Solving: 5-Solves basic 90% of the time/requires cueing < 10% of the time Memory Memory: 5-Recognizes or recalls 90% of the time/requires cueing < 10% of the time  Pain Pain Assessment Pain Assessment: No/denies pain  Therapy/Group: Individual Therapy  Maxie Debose 09/01/2013, 3:56 PM

## 2013-09-01 NOTE — Progress Notes (Signed)
Occupational Therapy Session Note  Patient Details  Name: [name] MRN: [id number] Date of Birth: Jun 24, 1950  Today's Date: 09/01/2013 Time: 3833-3832 Time Calculation (min): 60 min  Short Term Goals: Week 1:  OT Short Term Goal 1 (Week 1): Pt will complete bathing with mod assist of 1 caregiver at sit > stand level OT Short Term Goal 2 (Week 1): Pt will complete LB dressing with max assist of 1 caregiver at sit > stand level OT Short Term Goal 3 (Week 1): Pt will complete UB dressing at mod assist OT Short Term Goal 4 (Week 1): Pt will complete toilet transfer max assist of 1 caregiver OT Short Term Goal 5 (Week 1): Pt will complete 1/3 toileting tasks with max assist of 1 caregiver  Skilled Therapeutic Interventions/Progress Updates:    ADL retraining with focus on transfers, sit > stand, and use of LUE as stabilizer during bathing and dressing tasks.  Performed stand pivot bed > w/c with mod assist with pt with increased sit > stand and ability to advance LLE this session.  Stand pivot into walk-in shower with sidestepping with grab bar, pt required min assist and verbal cues to attend to placement of LLE prior to and during transfer.  Incorporated LUE into bathing by propping on grab bar to allow washing of underarm and then for steady when standing to wash buttocks.  Pt utilized dicem for further stabilization at sink in sitting and standing to complete dressing and grooming tasks.  Educated on ensuring Lt elbow on surface to provide further support.  Min assist sit > stand for LB dressing, but due to body habitus continues to require max/total assist with LB dressing.  Educated on self-ROM activities for LUE to complete between therapy sessions which pt return demonstrated.  Therapy Documentation Precautions:  Precautions Precautions: Fall Precaution Comments: Lt hemiparesis Restrictions Weight Bearing Restrictions: No Pain:  Pt with no c/o pain  See FIM for current functional  status  Therapy/Group: Individual Therapy  Simonne Come 09/01/2013, 10:11 AM

## 2013-09-01 NOTE — Progress Notes (Signed)
Physical Therapy Session Note  Patient Details  Name: [name] MRN: [id number] Date of Birth: 1950/10/07  Today's Date: 09/01/2013 Time: 6808-8110 Time Calculation (min): 60 min  Short Term Goals: Week 1:  PT Short Term Goal 1 (Week 1): Pt will perform supine>sit with with HOB flat using rail requiring min A and 25% cueing for sequencing. PT Short Term Goal 2 (Week 1): Pt will perform sit>supine with HOB flat requiring min A and 25% cueing for sequencing. PT Short Term Goal 3 (Week 1): Pt will perform bed<>chair transfer with max A and 50% cueing for technique and sequencing. PT Short Term Goal 4 (Week 1): Pt will perform w/c mobility x150' in controlled environment with supervision and 25% cueing for technique. PT Short Term Goal 5 (Week 1): Pt will perform gait x50' in controlled environment with Total A of single therapist and 75% cueing.  Skilled Therapeutic Interventions/Progress Updates:   Focus on ambulation, L UE/LE NMR, bed mobility. Pt received sitting in w/c, agreeable to therapy. W/c propulsion using R hemi technique 2 x 150 ft and management of brakes/leg rests with supervision. Gait training x 30 ft and 2 x 15 ft with use of LBQC and L DF wrap assist, overall min A, pt with LLE adduction due to circumduction resulting in narrowed BOS. Pt requesting to trial gait without DF wrap assist, ambulated x 10 ft with mod-max A and decreased L foot clearance, requires assist to advance LE and to prevent fall when attempting to turn. Pt performed bed mobility on mat with mod A, cues for sequencing and assist to stabilize LUE in WB through elbow x 3. In supine, therapist demonstrated PNF pattern on RLE before performing with LLE for improved sequencing and timing as well as strengthening. Pt performed bridging x 10 for improved glute activation. Pt with WB through LUE on mat during seated rest breaks. Pt returned to room and left sitting in w/c with all needs within reach.   Therapy  Documentation Precautions:  Precautions Precautions: Fall Precaution Comments: Lt hemiparesis Restrictions Weight Bearing Restrictions: No Pain: Pain Assessment Pain Assessment: No/denies pain  See FIM for current functional status  Therapy/Group: Individual Therapy  Laretta Alstrom 09/01/2013, 4:32 PM

## 2013-09-02 ENCOUNTER — Encounter (HOSPITAL_COMMUNITY): Payer: Medicare Other | Admitting: Occupational Therapy

## 2013-09-02 ENCOUNTER — Inpatient Hospital Stay (HOSPITAL_COMMUNITY): Payer: Medicare Other | Admitting: Physical Therapy

## 2013-09-02 ENCOUNTER — Inpatient Hospital Stay (HOSPITAL_COMMUNITY): Payer: Medicare Other | Admitting: Speech Pathology

## 2013-09-02 ENCOUNTER — Inpatient Hospital Stay (HOSPITAL_COMMUNITY): Payer: Medicare Other | Admitting: Occupational Therapy

## 2013-09-02 DIAGNOSIS — I10 Essential (primary) hypertension: Secondary | ICD-10-CM

## 2013-09-02 DIAGNOSIS — I6330 Cerebral infarction due to thrombosis of unspecified cerebral artery: Secondary | ICD-10-CM

## 2013-09-02 DIAGNOSIS — Z5189 Encounter for other specified aftercare: Secondary | ICD-10-CM

## 2013-09-02 LAB — GLUCOSE, CAPILLARY: Glucose-Capillary: 97 mg/dL (ref 70–99)

## 2013-09-02 NOTE — Progress Notes (Signed)
Subjective/Complaints: 63 y.o. right handed female with history of hypertension. Patient lives alone and was independent prior to admission. Admitted 08/23/2013 with left-sided weakness of acute onset after returning home from work as well as slurred speech. MRI showed acute infarction within the right posterior limb internal capsule/corona radiata. MRA of the head with no major occlusion or stenosis. Echocardiogram with ejection fraction of 67% grade 1 diastolic dysfunction. Carotid Dopplers with no ICA stenosis. Patient did not receive TPA. Neurology consulted placed on aspirin for CVA prophylaxis as well as enrollment in the Point trial study.   No pains, no movement noted LUE Affect flat Appetite and sleep reported as good  Review of Systems - Negative except frequent urination no burning  Objective: Vital Signs: Blood pressure 135/72, pulse 73, temperature 98.2 F (36.8 C), temperature source Oral, resp. rate 18, height 5\' 3"  (1.6 m), weight 110.5 kg (243 lb 9.7 oz), SpO2 95.00%. No results found. No results found for this or any previous visit (from the past 72 hour(s)).   HEENT: normal Cardio: RRR Resp: CTA B/L GI: BS positive Extremity:  No Edema Skin:   Intact Neuro: Flat, Abnormal Motor 0/5 LUE, 1+ L HF, 0/5 L foot/ankle , 3- Knee ext with Hip ext synergy  and Dysarthric Musc/Skel:  Normal, left shoulder no increased tone, no pain with ext rotation, + pain with abduction Gen NAD   Assessment/Plan: 1. Functional deficits secondary to R PLIC infarct which require 3+ hours per day of interdisciplinary therapy in a comprehensive inpatient rehab setting. Physiatrist is providing close team supervision and 24 hour management of active medical problems listed below. Physiatrist and rehab team continue to assess barriers to discharge/monitor patient progress toward functional and medical goals. FIM: FIM - Bathing Bathing Steps Patient Completed: Chest;Left Arm;Abdomen;Front  perineal area;Right upper leg;Left upper leg;Buttocks Bathing: 3: Mod-Patient completes 5-7 72f 10 parts or 50-74%  FIM - Upper Body Dressing/Undressing Upper body dressing/undressing steps patient completed: Thread/unthread right sleeve of pullover shirt/dresss;Put head through opening of pull over shirt/dress Upper body dressing/undressing: 3: Mod-Patient completed 50-74% of tasks FIM - Lower Body Dressing/Undressing Lower body dressing/undressing steps patient completed: Thread/unthread right underwear leg;Thread/unthread right pants leg Lower body dressing/undressing: 1: Total-Patient completed less than 25% of tasks  FIM - Toileting Toileting steps completed by patient: Performs perineal hygiene Toileting Assistive Devices: Grab bar or rail for support Toileting: 2: Max-Patient completed 1 of 3 steps  FIM - Radio producer Devices: Grab bars Toilet Transfers: 2-To toilet/BSC: Max A (lift and lower assist);2-From toilet/BSC: Max A (lift and lower assist)  FIM - Engineer, site Assistive Devices: Arm rests Bed/Chair Transfer: 3: Supine > Sit: Mod A (lifting assist/Pt. 50-74%/lift 2 legs;3: Sit > Supine: Mod A (lifting assist/Pt. 50-74%/lift 2 legs);3: Bed > Chair or W/C: Mod A (lift or lower assist);3: Chair or W/C > Bed: Mod A (lift or lower assist)  FIM - Locomotion: Wheelchair Distance: 150 Locomotion: Wheelchair: 5: Travels 150 ft or more: maneuvers on rugs and over door sills with supervision, cueing or coaxing FIM - Locomotion: Ambulation Locomotion: Ambulation Assistive Devices: Education officer, community (comment) (ace wrap DF assist) Ambulation/Gait Assistance: 4: Min assist Locomotion: Ambulation: 1: Travels less than 50 ft with minimal assistance (Pt.>75%)  Comprehension Comprehension Mode: Auditory Comprehension: 5-Follows basic conversation/direction: With extra time/assistive device  Expression Expression Mode:  Verbal Expression: 4-Expresses basic 75 - 89% of the time/requires cueing 10 - 24% of the time. Needs helper to occlude trach/needs to  repeat words.  Social Interaction Social Interaction: 6-Interacts appropriately with others with medication or extra time (anti-anxiety, antidepressant).  Problem Solving Problem Solving: 5-Solves basic 90% of the time/requires cueing < 10% of the time  Memory Memory: 5-Recognizes or recalls 90% of the time/requires cueing < 10% of the time  Medical Problem List and Plan:  1. Functional deficits secondary to right PLIC infarcts secondary to small vessel disease  2. DVT Prophylaxis/Anticoagulation: SCDs. Monitor for any signs of DVT  3. Pain Management: Tylenol as needed  4. Hypertension. Lopressor 12.5 mg twice a day. Monitor with increased mobility  5. Neuropsych: This patient is capable of making decisions on her own behalf.  6. Hyperlipidemia. Lipitor   LOS (Days) 7 A FACE TO FACE EVALUATION WAS PERFORMED  KIRSTEINS,ANDREW E 09/02/2013, 7:12 AM

## 2013-09-02 NOTE — Progress Notes (Signed)
Speech Language Pathology Daily Session Note  Patient Details  Name: [name] MRN: [id number] Date of Birth: 12-04-1950  Today's Date: 09/02/2013 Time: 7035-0093 Time Calculation (min): 33 min  Short Term Goals: Week 1: SLP Short Term Goal 1 (Week 1): Pt will utilize speech intelligibility strategies (slow rate, increased vocal intensity) at the sentence level with Min A multimodal cues.  SLP Short Term Goal 2 (Week 1): Pt will utilize diaphragmatic breathing at the phrase level with Min A multimodal cues.  SLP Short Term Goal 3 (Week 1): Pt will recall current medications with Mod I. SLP Short Term Goal 4 (Week 1): Pt will demonstrate appropriate medication management with use of a pill box with Mod I.   Skilled Therapeutic Interventions:  Pt was seen for skilled speech therapy targeting complex problem solving during a structured kitchen task.  Upon arrival, pt was awake, alert, and agreeable to participate in [place].  Pt propelled herself in her wheelchair with supervision instructional cues for novel way finding tasks.  In the kitchen pt was modified independent for safety awareness while multitasking and recalled safety precautions for functional mobility for 100% accuracy with initial supervision question cues.  Pt was modified independent for recall of route finding from kitchen back to her room.   Continue per current plan of care.    FIM:  Comprehension Comprehension Mode: Auditory Comprehension: 5-Understands complex 90% of the time/Cues < 10% of the time Expression Expression Mode: Verbal Expression: 5-Expresses basic 90% of the time/requires cueing < 10% of the time. Social Interaction Social Interaction: 6-Interacts appropriately with others with medication or extra time (anti-anxiety, antidepressant). Problem Solving Problem Solving: 5-Solves complex 90% of the time/cues < 10% of the time Memory Memory: 5-Recognizes or recalls 90% of the time/requires cueing < 10% of the  time  Pain Pain Assessment Pain Assessment: No/denies pain  Therapy/Group: Individual Therapy  [name], M.A. CCC-SLP  [name], [name] 09/02/2013, 5:02 PM

## 2013-09-02 NOTE — Progress Notes (Signed)
Occupational Therapy Session Note  Patient Details  Name: [name] MRN: [id number] Date of Birth: Jul 14, 1950  Today's Date: 09/02/2013 Time: 5188-4166 and 0630-1601 Time Calculation (min): 60 min and 30 min  Short Term Goals: Week 1:  OT Short Term Goal 1 (Week 1): Pt will complete bathing with mod assist of 1 caregiver at sit > stand level OT Short Term Goal 2 (Week 1): Pt will complete LB dressing with max assist of 1 caregiver at sit > stand level OT Short Term Goal 3 (Week 1): Pt will complete UB dressing at mod assist OT Short Term Goal 4 (Week 1): Pt will complete toilet transfer max assist of 1 caregiver OT Short Term Goal 5 (Week 1): Pt will complete 1/3 toileting tasks with max assist of 1 caregiver  Skilled Therapeutic Interventions/Progress Updates:    1) Engaged in ADL retraining at sit > stand level at sink with focus on LLE and LUE placement to increase independence with sit > stand and upright standing.  Pt declined bathing this session, focused on dressing with pt able to don shirt with setup only this session.  Educated on use of long handled shoe horn to doff socks and don shoes, pt continues to require assist with donning socks and Lt shoe.  Sit > stand this session with min assist post positioning of LLE and pt able to maintain dynamic standing when attempting to pull up pants with min assist.  Toilet transfer with use of grab bar and assist for increased placement and advancement of LLE.  In gym engaged in [place] in supine and sidelying with focus on proper alignment of shoulder and eliciting shoulder flexion with gravity eliminated.  Pt demonstrating carryover of attention to LUE and LLE with rolling, however continues to require assist with positioning LLE with sidelying to sit.    2) Engaged in [place] in sitting with focus on weight bearing through LUE to promote increased proprioception and active movement.  Completed reaching task while weight bearing through LUE challenging  pt's dynamic sitting balance as needed for dressing tasks.  Post WB activity pt demonstrated increased shoulder elevation, flexion, and internal rotation with gravity minimized.  Therapy Documentation Precautions:  Precautions Precautions: Fall Precaution Comments: Lt hemiparesis Restrictions Weight Bearing Restrictions: No General:   Vital Signs: Therapy Vitals Pulse Rate: 92 BP: 136/82 mmHg Patient Position (if appropriate): Standing (Asymptomatic) Pain: Pain Assessment Pain Assessment: No/denies pain Pain Score: 0-No pain  See FIM for current functional status  Therapy/Group: Individual Therapy  Simonne Come 09/02/2013, 10:47 AM

## 2013-09-02 NOTE — Progress Notes (Addendum)
Physical Therapy Session Note  Patient Details  Name: [name] MRN: [id number] Date of Birth: 1950-10-27  Today's Date: 09/02/2013 Time: 1003-1103 Time Calculation (min): 60 min  Short Term Goals: Week 1:  PT Short Term Goal 1 (Week 1): Pt will perform supine>sit with with HOB flat using rail requiring min A and 25% cueing for sequencing. PT Short Term Goal 2 (Week 1): Pt will perform sit>supine with HOB flat requiring min A and 25% cueing for sequencing. PT Short Term Goal 3 (Week 1): Pt will perform bed<>chair transfer with max A and 50% cueing for technique and sequencing. PT Short Term Goal 4 (Week 1): Pt will perform w/c mobility x150' in controlled environment with supervision and 25% cueing for technique. PT Short Term Goal 5 (Week 1): Pt will perform gait x50' in controlled environment with Total A of single therapist and 75% cueing.  Skilled Therapeutic Interventions/Progress Updates:    Pt received seated in w/c; agreeable to therapy. Pt reporting perception of "room spinning" upon getting OOB this AM. Pt reports no previous history of dizziness or vertiginous symptoms. Noted no significant change in BP and no symptoms with transition from seated to standing. See vitals for detailed readings. Bilat Dix-Hallpike and bilat Roll Tests negative and asymptomatic, suggesting no presence of positional vertigo. No further episodes of vertiginous symptoms noted during this session.  Pt performed w/c mobility x170' total in controlled and home environments with R hemi technique, increased time, and supervision. Explained purpose of AFO and donned L AFO ([place] Reaction) for dorsiflexion assist. Pt performed gait x30' in controlled environment with R rail, L AFO, and min A, light manual facilitation for lateral weight shift to R side, and tactile cueing at L hip extensors for LLE stance stability. Noted single episode of slight L knee buckle with effective self-recovery. Transitioned to gait  x34' in controlled environment with LBQC, L AFO, and step-to pattern requiring min A, increased time. Session ended in pt room, where pt was left seated in w/c with grandson present and all needs within reach.  Therapy Documentation Precautions:  Precautions Precautions: Fall Precaution Comments: Lt hemiparesis Restrictions Weight Bearing Restrictions: No Vital Signs: Therapy Vitals Pulse Rate: 92 BP: 136/82 mmHg Patient Position (if appropriate): Standing (Asymptomatic) Pain: Pain Assessment Pain Assessment: No/denies pain Pain Score: 0-No pain Locomotion : Ambulation Ambulation/Gait Assistance: 4: Min assist Wheelchair Mobility Distance: 170   See FIM for current functional status  Therapy/Group: Individual Therapy  Hobble, Malva Cogan 09/02/2013, 12:49 PM

## 2013-09-03 ENCOUNTER — Inpatient Hospital Stay (HOSPITAL_COMMUNITY): Payer: Medicare Other | Admitting: Physical Therapy

## 2013-09-03 ENCOUNTER — Encounter (HOSPITAL_COMMUNITY): Payer: Medicare Other | Admitting: Occupational Therapy

## 2013-09-03 ENCOUNTER — Inpatient Hospital Stay (HOSPITAL_COMMUNITY): Payer: Medicare Other | Admitting: Occupational Therapy

## 2013-09-03 ENCOUNTER — Inpatient Hospital Stay (HOSPITAL_COMMUNITY): Payer: Medicare Other | Admitting: Speech Pathology

## 2013-09-03 NOTE — Patient Care Conference (Signed)
Inpatient RehabilitationTeam Conference and Plan of Care Update Date: 09/03/2013   Time: 11;10 AM    Patient Name: [name]      Medical Record Number: [id number]  Date of Birth: 1950/12/02 Sex: Female         Room/Bed: 4M09C/4M09C-01 Payor Info: Payor: MEDICARE / Plan: MEDICARE PART A AND B / Product Type: *No Product type* /    Admitting Diagnosis: R CVA  Admit Date/Time:  08/26/2013  5:49 PM Admission Comments: No comment available   Primary Diagnosis:  <principal problem not specified> Principal Problem: <principal problem not specified>  Patient Active Problem List   Diagnosis Date Noted  . Chronic diastolic heart failure 62/83/1517  . Dyslipidemia 08/26/2013  . Borderline diabetes 08/26/2013  . CVA (cerebral infarction) 08/26/2013  . Acute CVA (cerebrovascular accident) 08/23/2013  . Pre-syncope 08/17/2012  . Hypokalemia 08/17/2012  . ARTHRITIS 03/28/2010  . DE QUERVAIN'S TENOSYNOVITIS 03/28/2010  . Normocytic anemia 07/26/2009  . REACTIVE AIRWAY DISEASE 03/09/2009  . DENTAL CARIES 03/09/2009  . CANDIDIASIS OF VULVA AND VAGINA 06/04/2008  . SKIN TAG 06/04/2008  . ARTHRITIS, KNEES, BILATERAL 05/22/2008  . HYPERTENSION, BENIGN ESSENTIAL 05/04/2008  . KNEE PAIN, BILATERAL 05/04/2008  . COUGH 05/04/2008  . MENISCUS TEAR, RIGHT 12/07/2005    Expected Discharge Date: Expected Discharge Date: 09/18/2013  Team Members Present: Physician leading conference: Dr. Alysia Penna Social Worker Present: Ovidio Kin, LCSW Nurse Present: Dorien Chihuahua, RN PT Present: Raylene Everts, PT;Blair Hobble, PT OT Present: Willeen Cass, OT SLP Present: Windell Moulding, SLP PPS Coordinator present : Ileana Ladd, Lelan Pons, RN, CRRN     Current Status/Progress Goal Weekly Team Focus  Medical   No movement Left arm, Left LE improvement  return to home with family assist  Upgrading goal to Sup for mobility   Bowel/Bladder   cont of bowel and bladder, occasional urgency; LBM 7/28   cont of bowel and bladder with min assist  monitor, offer toileting    Swallow/Nutrition/ Hydration     WFL        ADL's   mod assist stand pivot transfer, supervision grooming and UB dressing, max-total assist LB dressing  supervision seated self-care, min assist LB and toileting  transfers, balance, LUE NMR   Mobility   Mod A stand pivot transfers; Min A gait and stairs  Upgraded goals to supervision overall  transfers, bed mobility, standing, gait/stairs, L NMR, standing balance, family training, pursue L AFO   Communication   min assist-supervision for speech intelligibility   Mod I  carryover of strategies    Safety/Cognition/ Behavioral Observations  supervision for higher level problem solving and memory   mod I  carryover of compensatory   Pain   n/a; prn tylenol for mild pain  pain managed less than 3/10  offer prn pain med prior to therapy sessions   Skin   CDI  no infection or skin breakdown  monitor      *See Care Plan and progress notes for long and short-term goals.  Barriers to Discharge: poor arm recovery    Possible Resolutions to Barriers:  Cont rehab    Discharge Planning/Teaching Needs:  Pt would like to go home 8/8 due to family reunion she wants to go to.  Daughter here and involved      Team Discussion:  Pt is making good progress-upgraded goals.  Arm is slowly making progress-pt plans to stay here until 8/13 to get the most benefit from her therapies.  Revisions to  Treatment Plan:  Upgraded goals to supervision level.  SP 3 x week   Continued Need for Acute Rehabilitation Level of Care: The patient requires daily medical management by a physician with specialized training in physical medicine and rehabilitation for the following conditions: Daily direction of a multidisciplinary physical rehabilitation program to ensure safe treatment while eliciting the highest outcome that is of practical value to the patient.: Yes Daily medical management of  patient stability for increased activity during participation in an intensive rehabilitation regime.: Yes Daily analysis of laboratory values and/or radiology reports with any subsequent need for medication adjustment of medical intervention for : Neurological problems  Christe Tellez, Gardiner Rhyme 09/03/2013, 2:30 PM

## 2013-09-03 NOTE — Progress Notes (Signed)
Speech Language Pathology Weekly Progress and Session Note  Patient Details  Name: [name] MRN: [id number] Date of Birth: 1951/01/01  Beginning of progress report period: August 27, 2013 End of progress report period: September 03, 2013  Today's Date: 09/03/2013 Time: 5784-6962 Time Calculation (min): 30 min  Short Term Goals: Week 1: SLP Short Term Goal 1 (Week 1): Pt will utilize speech intelligibility strategies (slow rate, increased vocal intensity) at the sentence level with Min A multimodal cues.  SLP Short Term Goal 1 - Progress (Week 1): Met SLP Short Term Goal 2 (Week 1): Pt will utilize diaphragmatic breathing at the phrase level with Min A multimodal cues.  SLP Short Term Goal 2 - Progress (Week 1): Met SLP Short Term Goal 3 (Week 1): Pt will recall current medications with Mod I. SLP Short Term Goal 3 - Progress (Week 1): Progressing toward goal SLP Short Term Goal 4 (Week 1): Pt will demonstrate appropriate medication management with use of a pill box with Mod I.  SLP Short Term Goal 4 - Progress (Week 1): Met    New Short Term Goals: Week 2: SLP Short Term Goal 1 (Week 2): Pt will utilize speech intelligibility strategies (slow rate, increased vocal intensity) at the sentence level with supervision cues.  SLP Short Term Goal 2 (Week 2): Pt will utilize diaphragmatic breathing at the phrase level with supervision cues.  SLP Short Term Goal 3 (Week 2): Pt will recall current medications with Mod I.  Weekly Progress Updates:  Pt made functional gains this reporting period and has met 3 out of 4 short term goals due to improved use of a pill box to facilitate improved recall of medications, use of diaphragmatic breathing to improve breath support for speech, and use of dysarthria strategies to improve speech intelligibility at the conversational level.  Currently, pt requires overall min assist-supervision level assist for cognitive-linguistic tasks.  Pt education is ongoing and  pt would benefit from ongoing speech therapy while inpatient to maximize functional independence for communication and reduce burden of care upon discharge.  Due to pt's significant progress made this reporting period, pt would benefit from decreasing intensity of ST from 5x/week to 3x/week.     Intensity: Minumum of 1-2 x/day, 30 to 90 minutes Frequency: 3 out of 7 days Duration/Length of Stay: D/C 09/18/13 from CIR but may D/C from SLP caseload earlier depending on progress Treatment/Interventions: Cognitive remediation/compensation;Cueing hierarchy;Functional tasks;Internal/external aids;Environmental controls;Oral motor exercises;Speech/Language facilitation;Patient/family education;Therapeutic Activities   Daily Session  Skilled Therapeutic Interventions: Pt was seen for skilled speech therapy targeting speech intelligibility goals.  Upon arrival, pt was upright in wheelchair, awake, alert, and agreeable to participate in [place].  SLP facilitated session with diaphragmatic breathing exercises targeting improved vocal intensity for speech with pt returning demonstration of techniques with supervision cues.  Furthermore, pt was noted to use diaphragmatic breathing during functional conversations with the SLP to improve speech intelligibility to ~85% intelligible.  SLP further facilitated the session with oral motor exercises targeting improved strength, range of motion, and coordination for speech with pt returning demonstration of exercises for 100% accuracy with supervision cues.  Pt recalled at least 2 compensatory dysarthria strategies with supervision question cues and reports independent use of strategies outside of therapy to improve intelligibility with both skilled and unskilled communication partners.  SLP also initiated education related to vocal hygiene as pt reports she primarily drinks juice throughout the day and does not drink enough water, which could contribute to her  hoarse vocal quality.   Goals updated on this date to reflect current progress and plan of care.        FIM:  Comprehension Comprehension: 5-Follows basic conversation/direction: With no assist Expression Expression Mode: Verbal Expression: 5-Expresses basic 90% of the time/requires cueing < 10% of the time. Social Interaction Social Interaction: 6-Interacts appropriately with others with medication or extra time (anti-anxiety, antidepressant). Problem Solving Problem Solving: 5-Solves basic 90% of the time/requires cueing < 10% of the time Memory Memory: 5-Recognizes or recalls 90% of the time/requires cueing < 10% of the time  Pain Pain Assessment Pain Assessment: No/denies pain  Therapy/Group: Individual Therapy  Windell Moulding, M.A. CCC-SLP  Korrin Waterfield, Selinda Orion 09/03/2013, 4:42 PM

## 2013-09-03 NOTE — Progress Notes (Deleted)
Social Work Patient ID: [name], female   DOB: Dec 14, 1950, 63 y.o.   MRN: [id number] Met with pt and left message for daughter-Wanda to inform of team conference goals of mod/i w/c level and supervision with ambulation.  She has assist with her bathing and dressing form her caregiver. She want sot not have to use a wheelchair at home and wants to use her walker.  She feels her fear is getting lessen and is feeling much better today.  Blair-PT reports her having a good day in Therapy today.  Discussed target date is 8/4 and that she would have HD prior to discharge.  Will see how pt does in the next few days regarding her ambulation and await daughter's return call.

## 2013-09-03 NOTE — Progress Notes (Signed)
Social Work Patient ID: [name], female   DOB: 1950/12/15, 63 y.o.   MRN: [id number] Met with pt to discuss team conference and progression toward her goals and the fact she is doing sl well her goals were updated to supervision over-all. She plans to stay here until 8/13 to get the most benefit from her therapies.  She wants to do well and make the most progress while here.  She is ok with missing her family [place].  She wishes her arm would catch up with her leg in making progress, but she is being patient.  Will continue to work on discharge needs and provide support.

## 2013-09-03 NOTE — Progress Notes (Addendum)
Subjective/Complaints: 63 y.o. right handed female with history of hypertension. Patient lives alone and was independent prior to admission. Admitted 08/23/2013 with left-sided weakness of acute onset after returning home from work as well as slurred speech. MRI showed acute infarction within the right posterior limb internal capsule/corona radiata. MRA of the head with no major occlusion or stenosis. Echocardiogram with ejection fraction of 82% grade 1 diastolic dysfunction. Carotid Dopplers with no ICA stenosis. Patient did not receive TPA. Neurology consulted placed on aspirin for CVA prophylaxis as well as enrollment in the Point trial study.   Asking whether sleep problems can cause stroke States she snores a lot and her sister has noted that she stops breathing for a while during sleep Affect flat   Review of Systems - Negative except frequent urination no burning  Objective: Vital Signs: Blood pressure 123/76, pulse 69, temperature 98.3 F (36.8 C), temperature source Oral, resp. rate 18, height $RemoveBe'5\' 3"'mtFqisGCi$  (1.6 m), weight 110.5 kg (243 lb 9.7 oz), SpO2 95.00%. No results found. Results for orders placed during the hospital encounter of 08/26/13 (from the past 72 hour(s))  GLUCOSE, CAPILLARY     Status: None   Collection Time    09/02/13  4:45 PM      Result Value Ref Range   Glucose-Capillary 97  70 - 99 mg/dL   Comment 1 Notify RN     Comment 2 Documented in Chart       HEENT: normal Cardio: RRR Resp: CTA B/L GI: BS positive Extremity:  No Edema Skin:   Intact Neuro: Flat, Abnormal Motor 0/5 LUE, 1+ L HF, 0/5 L foot/ankle , 3- Knee ext with Hip ext synergy  and Dysarthric Musc/Skel:  Normal, left shoulder no increased tone, no pain with ext rotation, + pain with abduction Gen NAD   Assessment/Plan: 1. Functional deficits secondary to R PLIC infarct which require 3+ hours per day of interdisciplinary therapy in a comprehensive inpatient rehab setting. Physiatrist is providing  close team supervision and 24 hour management of active medical problems listed below. Physiatrist and rehab team continue to assess barriers to discharge/monitor patient progress toward functional and medical goals. Team conference today please see physician documentation under team conference tab, met with team face-to-face to discuss problems,progress, and goals. Formulized individual treatment plan based on medical history, underlying problem and comorbidities. FIM: FIM - Bathing Bathing Steps Patient Completed: Chest;Left Arm;Abdomen;Front perineal area;Right upper leg;Left upper leg;Buttocks Bathing: 3: Mod-Patient completes 5-7 62f 10 parts or 50-74%  FIM - Upper Body Dressing/Undressing Upper body dressing/undressing steps patient completed: Thread/unthread right sleeve of pullover shirt/dresss;Thread/unthread left sleeve of pullover shirt/dress;Put head through opening of pull over shirt/dress;Pull shirt over trunk Upper body dressing/undressing: 5: Set-up assist to: Obtain clothing/put away FIM - Lower Body Dressing/Undressing Lower body dressing/undressing steps patient completed: Thread/unthread right underwear leg;Thread/unthread right pants leg;Don/Doff right shoe Lower body dressing/undressing: 2: Max-Patient completed 25-49% of tasks  FIM - Toileting Toileting steps completed by patient: Performs perineal hygiene Toileting Assistive Devices: Grab bar or rail for support Toileting: 2: Max-Patient completed 1 of 3 steps  FIM - Radio producer Devices: Grab bars Toilet Transfers: 3-To toilet/BSC: Mod A (lift or lower assist);3-From toilet/BSC: Mod A (lift or lower assist)  FIM - Bed/Chair Transfer Bed/Chair Transfer Assistive Devices: Arm rests Bed/Chair Transfer: 3: Supine > Sit: Mod A (lifting assist/Pt. 50-74%/lift 2 legs;3: Sit > Supine: Mod A (lifting assist/Pt. 50-74%/lift 2 legs);3: Bed > Chair or W/C: Mod A (lift or  lower assist);3: Chair or  W/C > Bed: Mod A (lift or lower assist)  FIM - Locomotion: Wheelchair Distance: 170 Locomotion: Wheelchair: 5: Travels 150 ft or more: maneuvers on rugs and over door sills with supervision, cueing or coaxing FIM - Locomotion: Ambulation Locomotion: Ambulation Assistive Devices: Cane - Quad;Other (comment) (L AFO (reaction)) Ambulation/Gait Assistance: 4: Min assist Locomotion: Ambulation: 1: Travels less than 50 ft with minimal assistance (Pt.>75%)  Comprehension Comprehension Mode: Auditory Comprehension: 5-Follows basic conversation/direction: With extra time/assistive device  Expression Expression Mode: Verbal Expression: 5-Expresses complex 90% of the time/cues < 10% of the time  Social Interaction Social Interaction: 6-Interacts appropriately with others with medication or extra time (anti-anxiety, antidepressant).  Problem Solving Problem Solving: 5-Solves basic 90% of the time/requires cueing < 10% of the time  Memory Memory: 5-Recognizes or recalls 90% of the time/requires cueing < 10% of the time  Medical Problem List and Plan:  1. Functional deficits secondary to right PLIC infarcts secondary to small vessel disease  2. DVT Prophylaxis/Anticoagulation: SCDs. Monitor for any signs of DVT  3. Pain Management: Tylenol as needed  4. Hypertension. Lopressor 12.5 mg twice a day. Monitor with increased mobility  5. Neuropsych: This patient is capable of making decisions on her own behalf.  6. Hyperlipidemia. Lipitor   LOS (Days) 8 A FACE TO FACE EVALUATION WAS PERFORMED  KIRSTEINS,ANDREW E 09/03/2013, 7:35 AM

## 2013-09-03 NOTE — Progress Notes (Signed)
Physical Therapy Session Note  Patient Details  Name: [name] MRN: [id number] Date of Birth: 06/06/1950  Today's Date: 09/03/2013 Time: 0932-1029 Time Calculation (min): 57 min  Short Term Goals: Week 1:  PT Short Term Goal 1 (Week 1): Pt will perform supine>sit with with HOB flat using rail requiring min A and 25% cueing for sequencing. PT Short Term Goal 2 (Week 1): Pt will perform sit>supine with HOB flat requiring min A and 25% cueing for sequencing. PT Short Term Goal 3 (Week 1): Pt will perform bed<>chair transfer with max A and 50% cueing for technique and sequencing. PT Short Term Goal 4 (Week 1): Pt will perform w/c mobility x150' in controlled environment with supervision and 25% cueing for technique. PT Short Term Goal 5 (Week 1): Pt will perform gait x50' in controlled environment with Total A of single therapist and 75% cueing.  Skilled Therapeutic Interventions/Progress Updates:    Pt received seated in w/c; agreeable to therapy. Donned L AFO (Reaction). Performed w/c mobility x150' in controlled environment with R hemi technique and supervision. Negotiated 5 stairs with R rail, step-to pattern and min guard to ascend, min A to descend. Pt performed gait 2x30' in controlled environment with [facility] and min guard-min A (to recover from posterior LOB x3 when pt distracted by staff passing by in hallway); verbal/tactile cueing focused on L LE stance stability (to maintain L hip/knee extension). Final gait trial x55' as described above with addition of surgical cover on L shoe to decrease friction, address L toe catch. Per pt request for exercises to perform in room between sessions, explained and demonstrated L ankle dorsiflexion AAROM using leg lifter. Session ended in pt room, where pt was left seated in w/c with all needs within reach.  Therapy Documentation Precautions:  Precautions Precautions: Fall Precaution Comments: Lt hemiparesis Restrictions Weight Bearing  Restrictions: No Pain: Pain Assessment Pain Assessment: No/denies pain Locomotion : Ambulation Ambulation/Gait Assistance: 4: Min assist;4: Min Building control surveyor Distance: 150   See FIM for current functional status  Therapy/Group: Individual Therapy  Rionna Feltes, Malva Cogan 09/03/2013, 12:45 PM

## 2013-09-03 NOTE — Progress Notes (Signed)
Occupational Therapy Weekly Progress Note  Patient Details  Name: [name] MRN: [id number] Date of Birth: [birth year]  Beginning of progress report period: August 27, 2013 End of progress report period: September 03, 2013  Today's Date: 09/03/2013 Time: 2376-2831 and 5176-1607 Time Calculation (min): 60 min and 33 min  Patient has met 5 of 5 short term goals.  Pt is making great progress towards goals.  She is currently a min-mod assist with stand pivot transfers and a min assist squat pivot. Pt has progressed to supervision UB dressing, but continues to require mod-max assist with LB dressing secondary to hemiplegia and body habitus.  Have begun introducing AE to assist with LB self-care tasks.  Pt's LUE continues to be greatest impairment, however slight shoulder flexion has been elicited post weight bearing during treatment sessions.  Pt is extremely motivated and has made progress quickly, therefore goals upgraded to supervision overall, except LB dressing.  Patient continues to demonstrate the following deficits: Lt hemiplegia, impaired balance, decreased dynamic standing and therefore will continue to benefit from skilled OT intervention to enhance overall performance with BADL, iADL and Reduce care partner burden.  Patient progressing toward long term goals..  Plan of care revisions: upgraded to supervision overall, except min assist LB dressing.  OT Short Term Goals Week 1:  OT Short Term Goal 1 (Week 1): Pt will complete bathing with mod assist of 1 caregiver at sit > stand level OT Short Term Goal 1 - Progress (Week 1): Met OT Short Term Goal 2 (Week 1): Pt will complete LB dressing with max assist of 1 caregiver at sit > stand level OT Short Term Goal 2 - Progress (Week 1): Met OT Short Term Goal 3 (Week 1): Pt will complete UB dressing at mod assist OT Short Term Goal 3 - Progress (Week 1): Met OT Short Term Goal 4 (Week 1): Pt will complete toilet transfer max assist of 1  caregiver OT Short Term Goal 4 - Progress (Week 1): Met OT Short Term Goal 5 (Week 1): Pt will complete 1/3 toileting tasks with max assist of 1 caregiver OT Short Term Goal 5 - Progress (Week 1): Met Week 2:  OT Short Term Goal 1 (Week 2): Pt will complete bathing with min assist at sit > stand level OT Short Term Goal 2 (Week 2): Pt will complete LB dressing with mod assist at sit > stand level with AE as needed OT Short Term Goal 3 (Week 2): Pt will complete toilet transfer with min assist OT Short Term Goal 4 (Week 2): Pt will complete tub/shower transfer with min assist with use of tub transfer bench OT Short Term Goal 5 (Week 2): Pt will utilize LUE as stabilizer during self-care tasks with <10% cues.  Skilled Therapeutic Interventions/Progress Updates:  Balance/vestibular training;Discharge planning;Disease Lawyer;Functional mobility training;Neuromuscular re-education;Pain management;Patient/family education;Psychosocial support;Self Care/advanced ADL retraining;Therapeutic Activities;Therapeutic Exercise;UE/LE Strength taining/ROM;UE/LE Coordination activities  1) Engaged in ADL retraining with focus on transfers, sit > stand, and use of AE to increase independence with LB self-care tasks.  Completed bathing and dressing at sit > stand level in walk-in shower.  Sidestep transfer min assist into shower with use of grab bar for steadying.  Pt with carryover of proper placement of LUE during self-care tasks without verbal cues, however requires occasional hand over hand assist to secure grasp.  Dressing completed at sink with education on use of long handled shoe horn, reacher, and sock aid for LB dressing.  Pt  unable to use sock aid due to requiring wide sock aid, however this session demonstrated understanding of use of reacher to assist with donning pants.  Pt continues to struggle with donning socks and shoes despite use of AE, plan to further  problem solve.  Educated pt on tub/shower transfer with use of tub transfer bench.  Required physical assist to lift LLE in/out over tub ledge.    2) NMR with focus on weight bearing through elbow to address functional return.  Weight bearing through Lt elbow in unsupported sitting while completing reaching activity to challenge balance while increasing weight bearing.  Pt demonstrates increased shoulder flexion and adduction post weight bearing.  Stand pivot mat > w/c with min/steady assist.    Therapy Documentation Precautions:  Precautions Precautions: Fall Precaution Comments: Lt hemiparesis Restrictions Weight Bearing Restrictions: No General:   Vital Signs: Therapy Vitals Temp: 97.5 F (36.4 C) Temp src: Oral Pulse Rate: 74 Resp: 18 BP: 114/56 mmHg Patient Position (if appropriate): Sitting Oxygen Therapy SpO2: 98 % O2 Device: None (Room air) Pain:  Pt with no c/o pain  See FIM for current functional status  Therapy/Group: Individual Therapy  Simonne Come 09/03/2013, 3:42 PM

## 2013-09-03 NOTE — Progress Notes (Signed)
Nutrition Brief Note  Patient identified on the Malnutrition Screening Tool (MST) Report  Wt Readings from Last 15 Encounters:  08/27/13 243 lb 9.7 oz (110.5 kg)  08/23/13 237 lb 10.5 oz (107.8 kg)  05/12/13 250 lb 8 oz (113.626 kg)  10/26/12 240 lb (108.863 kg)  08/18/12 238 lb (107.956 kg)  03/28/10 231 lb 5 oz (104.923 kg)  03/09/09 233 lb 11.2 oz (106.006 kg)  09/10/08 231 lb (104.781 kg)  06/04/08 233 lb (105.688 kg)  05/04/08 234 lb (106.142 kg)   Pt reports good appetite throughout hospitalization. Reveals UBW of 245#. She reports she was on a diet PTA for intentional weight loss. She shared that she had been cutting out sodas and limiting salt intake in her diet. She reports that the low sodium diet restriction has "taken some getting used to". RD discussed importance of diet restriction and discussed alternate ways to flavor foods without salt, susch as Mrs. Dash and herbs and spices. Encouraged pt to use Mrs. Dash packet on trays to flavor foods. Provided additional encouragement for weight loss efforts to improve overall health. Noted Hgb A1c of 6.1, which is may be a result of impaired glucose metabolism. Pt very grateful for RD visit.   Body mass index is 43.16 kg/(m^2). Patient meets criteria for extreme obesity, class III based on current BMI.   Current diet order is Heart Healthy, patient is consuming approximately 100% of meals at this time. Labs and medications reviewed.   No nutrition interventions warranted at this time. If nutrition issues arise, please consult RD.   Aleasha Fregeau A. Jimmye Norman, RD, LDN Pager: [phone] After hours Pager: [phone]

## 2013-09-03 NOTE — Progress Notes (Signed)
Social Work [name], LCSW Social Worker Signed  Patient Care Conference Service date: 09/03/2013 2:30 PM  Inpatient RehabilitationTeam Conference and Plan of Care Update Date: 09/03/2013   Time: 11;10 AM     Patient Name: [name]       Medical Record Number: [id number]   Date of Birth: [birth year] Sex: Female         Room/Bed: 4M09C/4M09C-01 Payor Info: Payor: MEDICARE / Plan: MEDICARE PART A AND B / Product Type: *No Product type* /   Admitting Diagnosis: R CVA   Admit Date/Time:  08/26/2013  5:49 PM Admission Comments: No comment available   Primary Diagnosis:  <principal problem not specified> Principal Problem: <principal problem not specified>    Patient Active Problem List     Diagnosis  Date Noted   .  Chronic diastolic heart failure  64/36/4680   .  Dyslipidemia  08/26/2013   .  Borderline diabetes  08/26/2013   .  CVA (cerebral infarction)  08/26/2013   .  Acute CVA (cerebrovascular accident)  08/23/2013   .  Pre-syncope  08/17/2012   .  Hypokalemia  08/17/2012   .  ARTHRITIS  03/28/2010   .  DE QUERVAIN'S TENOSYNOVITIS  03/28/2010   .  Normocytic anemia  07/26/2009   .  REACTIVE AIRWAY DISEASE  03/09/2009   .  DENTAL CARIES  03/09/2009   .  CANDIDIASIS OF VULVA AND VAGINA  06/04/2008   .  SKIN TAG  06/04/2008   .  ARTHRITIS, KNEES, BILATERAL  05/22/2008   .  HYPERTENSION, BENIGN ESSENTIAL  05/04/2008   .  KNEE PAIN, BILATERAL  05/04/2008   .  COUGH  05/04/2008   .  MENISCUS TEAR, RIGHT  12/07/2005     Expected Discharge Date: Expected Discharge Date: 09/18/2013  Team Members Present: Physician leading conference: Dr. Alysia Penna Social Worker Present: Ovidio Kin, LCSW Nurse Present: Dorien Chihuahua, RN PT Present: Raylene Everts, PT;Blair Hobble, PT OT Present: Willeen Cass, OT SLP Present: Windell Moulding, SLP PPS Coordinator present : Ileana Ladd, Lelan Pons, RN, CRRN        Current Status/Progress  Goal  Weekly Team Focus   Medical      No movement Left arm, Left LE improvement  return to home with family assist  Upgrading goal to Sup for mobility   Bowel/Bladder     cont of bowel and bladder, occasional urgency; LBM 7/28  cont of bowel and bladder with min assist  monitor, offer toileting    Swallow/Nutrition/ Hydration     WFL       ADL's     mod assist stand pivot transfer, supervision grooming and UB dressing, max-total assist LB dressing  supervision seated self-care, min assist LB and toileting  transfers, balance, LUE NMR   Mobility     Mod A stand pivot transfers; Min A gait and stairs  Upgraded goals to supervision overall  transfers, bed mobility, standing, gait/stairs, L NMR, standing balance, family training, pursue L AFO   Communication     min assist-supervision for speech intelligibility   Mod I  carryover of strategies    Safety/Cognition/ Behavioral Observations    supervision for higher level problem solving and memory   mod I  carryover of compensatory   Pain     n/a; prn tylenol for mild pain  pain managed less than 3/10  offer prn pain med prior to therapy sessions   Skin     CDI  no infection or skin breakdown  monitor     *See Care Plan and progress notes for long and short-term goals.    Barriers to Discharge:  poor arm recovery      Possible Resolutions to Barriers:    Cont rehab      Discharge Planning/Teaching Needs:    Pt would like to go home 8/8 due to family reunion she wants to go to.  Daughter here and involved      Team Discussion:    Pt is making good progress-upgraded goals.  Arm is slowly making progress-pt plans to stay here until 8/13 to get the most benefit from her therapies.   Revisions to Treatment Plan:    Upgraded goals to supervision level.  SP 3 x week    Continued Need for Acute Rehabilitation Level of Care: The patient requires daily medical management by a physician with specialized training in physical medicine and rehabilitation for the following  conditions: Daily direction of a multidisciplinary physical rehabilitation program to ensure safe treatment while eliciting the highest outcome that is of practical value to the patient.: Yes Daily medical management of patient stability for increased activity during participation in an intensive rehabilitation regime.: Yes Daily analysis of laboratory values and/or radiology reports with any subsequent need for medication adjustment of medical intervention for : Neurological problems  Ason Heslin, Gard[place] Rhyme 09/03/2013, 2:30 PM         [name], LCSW Social Worker Signed  Patient Care Conference Service date: 08/27/2013 3:22 PM  Inpatient RehabilitationTeam Conference and Plan of Care Update Date: 08/27/2013   Time: 11;20 AM     Patient Name: [name]       Medical Record Number: [id number]   Date of Birth: Dec 29, 1950 Sex: Female         Room/Bed: 4M09C/4M09C-01 Payor Info: Payor: MEDICARE / Plan: MEDICARE PART A AND B / Product Type: *No Product type* /   Admitting Diagnosis: R CVA   Admit Date/Time:  08/26/2013  5:49 PM Admission Comments: No comment available   Primary Diagnosis:  <principal problem not specified> Principal Problem: <principal problem not specified>    Patient Active Problem List     Diagnosis  Date Noted   .  Chronic diastolic heart failure  64/36/6440   .  Dyslipidemia  08/26/2013   .  Borderline diabetes  08/26/2013   .  CVA (cerebral infarction)  08/26/2013   .  Acute CVA (cerebrovascular accident)  08/23/2013   .  Pre-syncope  08/17/2012   .  Hypokalemia  08/17/2012   .  ARTHRITIS  03/28/2010   .  DE QUERVAIN'S TENOSYNOVITIS  03/28/2010   .  Normocytic anemia  07/26/2009   .  REACTIVE AIRWAY DISEASE  03/09/2009   .  DENTAL CARIES  03/09/2009   .  CANDIDIASIS OF VULVA AND VAGINA  06/04/2008   .  SKIN TAG  06/04/2008   .  ARTHRITIS, KNEES, BILATERAL  05/22/2008   .  HYPERTENSION, BENIGN ESSENTIAL  05/04/2008   .  KNEE PAIN, BILATERAL  05/04/2008    .  COUGH  05/04/2008   .  MENISCUS TEAR, RIGHT  12/07/2005     Expected Discharge Date: Expected Discharge Date: 09/18/13  Team Members Present: Physician leading conference: Dr. Alysia Penna Social Worker Present: Ovidio Kin, LCSW Nurse Present: Elliot Cousin, RN PT Present: Billie Ruddy, Renaye Rakers, PT OT Present: Simonne Come, OT SLP Present: Windell Moulding, SLP PPS Coordinator present : Ileana Ladd,  Lelan Pons, RN, CRRN        Current Status/Progress  Goal  Weekly Team Focus   Medical     dysarthric, severe R HP  maximize functional recovery  Emphasis on emerging motor returen RLE   Bowel/Bladder     timed tolieting every 3 hours  cont B & B  timed tolieting   Swallow/Nutrition/ Hydration     wfl       ADL's     +2 transfers and LB self-care  supervision seated self-care, min assist LB and toileting  transfers, balance, LUE NMR   Mobility     +2A overall  Supervision transfers; Min A gait and stairs  basic transfers, bed mobility, standing, gait/stairs, L NMR, initiate patient/family education   Communication     min assist for speech intelligibility   mod I  educaiton and carryover of compensatory strategies    Safety/Cognition/ Behavioral Observations    min assist for higher level problem solving and memory   mod I   education and carryover of compensatory strategies    Pain     pain less than 3, monitor while here  no pain issues reported     Skin     monitor skin-no issues-none currently         *See Care Plan and progress notes for long and short-term goals.    Barriers to Discharge:  Functional level low      Possible Resolutions to Barriers:    cont rehab      Discharge Planning/Teaching Needs:    Home with daughter's assisting-aware of the alternative options-confirm with daughter's    Team Discussion:    Severe weakness on left side-OT reports moved arm this am.  New eval Should do well here-MD   Revisions to Treatment Plan:     New eval    Continued Need for Acute Rehabilitation Level of Care: The patient requires daily medical management by a physician with specialized training in physical medicine and rehabilitation for the following conditions: Daily direction of a multidisciplinary physical rehabilitation program to ensure safe treatment while eliciting the highest outcome that is of practical value to the patient.: Yes Daily medical management of patient stability for increased activity during participation in an intensive rehabilitation regime.: Yes Daily analysis of laboratory values and/or radiology reports with any subsequent need for medication adjustment of medical intervention for : Neurological problems;Other  [name] 08/29/2013, 3:26 PM          Patient ID: Loletta Parish, female   DOB: 1951-01-03, 63 y.o.   MRN: [id number]

## 2013-09-04 ENCOUNTER — Inpatient Hospital Stay (HOSPITAL_COMMUNITY): Payer: Medicare Other | Admitting: Rehabilitation

## 2013-09-04 ENCOUNTER — Inpatient Hospital Stay (HOSPITAL_COMMUNITY): Payer: Medicare Other | Admitting: Occupational Therapy

## 2013-09-04 ENCOUNTER — Encounter (HOSPITAL_COMMUNITY): Payer: Medicare Other | Admitting: Occupational Therapy

## 2013-09-04 DIAGNOSIS — Z5189 Encounter for other specified aftercare: Secondary | ICD-10-CM

## 2013-09-04 DIAGNOSIS — I10 Essential (primary) hypertension: Secondary | ICD-10-CM

## 2013-09-04 DIAGNOSIS — G8110 Spastic hemiplegia affecting unspecified side: Secondary | ICD-10-CM

## 2013-09-04 DIAGNOSIS — I6330 Cerebral infarction due to thrombosis of unspecified cerebral artery: Secondary | ICD-10-CM

## 2013-09-04 NOTE — Progress Notes (Signed)
Subjective/Complaints: 63 y.o. right handed female with history of hypertension. Patient lives alone and was independent prior to admission. Admitted 08/23/2013 with left-sided weakness of acute onset after returning home from work as well as slurred speech. MRI showed acute infarction within the right posterior limb internal capsule/corona radiata. MRA of the head with no major occlusion or stenosis. Echocardiogram with ejection fraction of 94% grade 1 diastolic dysfunction. Carotid Dopplers with no ICA stenosis. Patient did not receive TPA. Neurology consulted placed on aspirin for CVA prophylaxis as well as enrollment in the Point trial study.   Slept ok per pt  No new issues Affect flat   Review of Systems - Negative except frequent urination no burning  Objective: Vital Signs: Blood pressure 134/79, pulse 68, temperature 98.3 F (36.8 C), temperature source Oral, resp. rate 18, height 5\' 3"  (1.6 m), weight 110.6 kg (243 lb 13.3 oz), SpO2 97.00%. No results found. Results for orders placed during the hospital encounter of 08/26/13 (from the past 72 hour(s))  GLUCOSE, CAPILLARY     Status: None   Collection Time    09/02/13  4:45 PM      Result Value Ref Range   Glucose-Capillary 97  70 - 99 mg/dL   Comment 1 Notify RN     Comment 2 Documented in Chart       HEENT: normal Cardio: RRR Resp: CTA B/L GI: BS positive Extremity:  No Edema Skin:   Intact Neuro: Flat, Abnormal Motor 0/5 LUE, 1+ L HF, 0/5 L foot/ankle , 3- Knee ext with Hip ext synergy  and Dysarthric Musc/Skel:  Normal, left shoulder no increased tone, no pain with ext rotation, + pain with abduction Gen NAD   Assessment/Plan: 1. Functional deficits secondary to R PLIC infarct which require 3+ hours per day of interdisciplinary therapy in a comprehensive inpatient rehab setting. Physiatrist is providing close team supervision and 24 hour management of active medical problems listed below. Physiatrist and rehab team  continue to assess barriers to discharge/monitor patient progress toward functional and medical goals. Discussed D/C date, pt in agreement FIM: FIM - Bathing Bathing Steps Patient Completed: Chest;Left Arm;Abdomen;Front perineal area;Right upper leg;Left upper leg;Buttocks Bathing: 3: Mod-Patient completes 5-7 38f 10 parts or 50-74%  FIM - Upper Body Dressing/Undressing Upper body dressing/undressing steps patient completed: Thread/unthread right sleeve of pullover shirt/dresss;Thread/unthread left sleeve of pullover shirt/dress;Put head through opening of pull over shirt/dress;Pull shirt over trunk Upper body dressing/undressing: 5: Set-up assist to: Obtain clothing/put away FIM - Lower Body Dressing/Undressing Lower body dressing/undressing steps patient completed: Thread/unthread right underwear leg;Thread/unthread right pants leg;Thread/unthread left pants leg;Pull pants up/down;Don/Doff right shoe Lower body dressing/undressing: 3: Mod-Patient completed 50-74% of tasks  FIM - Toileting Toileting steps completed by patient: Performs perineal hygiene Toileting Assistive Devices: Grab bar or rail for support Toileting: 2: Max-Patient completed 1 of 3 steps  FIM - Radio producer Devices: Grab bars Toilet Transfers: 3-To toilet/BSC: Mod A (lift or lower assist);3-From toilet/BSC: Mod A (lift or lower assist)  FIM - Bed/Chair Transfer Bed/Chair Transfer Assistive Devices: Arm rests;Cane [facility]) Bed/Chair Transfer: 3: Sit > Supine: Mod A (lifting assist/Pt. 50-74%/lift 2 legs);3: Chair or W/C > Bed: Mod A (lift or lower assist);4: Supine > Sit: Min A (steadying Pt. > 75%/lift 1 leg);3: Bed > Chair or W/C: Mod A (lift or lower assist)  FIM - Locomotion: Wheelchair Distance: 150 Locomotion: Wheelchair: 5: Travels 150 ft or more: maneuvers on rugs and over door sills with supervision,  cueing or coaxing FIM - Locomotion: Ambulation Locomotion: Ambulation Assistive  Devices: Cane - Quad;Orthosis (L AFO; LBQC) Ambulation/Gait Assistance: 4: Min assist;4: Min guard Locomotion: Ambulation: 2: Travels 50 - 149 ft with minimal assistance (Pt.>75%)  Comprehension Comprehension Mode: Auditory Comprehension: 5-Follows basic conversation/direction: With no assist  Expression Expression Mode: Verbal Expression: 5-Expresses basic 90% of the time/requires cueing < 10% of the time.  Social Interaction Social Interaction: 6-Interacts appropriately with others with medication or extra time (anti-anxiety, antidepressant).  Problem Solving Problem Solving: 5-Solves basic 90% of the time/requires cueing < 10% of the time  Memory Memory: 5-Recognizes or recalls 90% of the time/requires cueing < 10% of the time  Medical Problem List and Plan:  1. Functional deficits secondary to right PLIC infarcts secondary to small vessel disease  2. DVT Prophylaxis/Anticoagulation: SCDs. Monitor for any signs of DVT  3. Pain Management: Tylenol as needed  4. Hypertension. Lopressor 12.5 mg twice a day. Monitor with increased mobility  5. Neuropsych: This patient is capable of making decisions on her own behalf.  6. Hyperlipidemia. Lipitor   LOS (Days) 9 A FACE TO FACE EVALUATION WAS PERFORMED  Deva Ron E 09/04/2013, 6:56 AM

## 2013-09-04 NOTE — Progress Notes (Signed)
Occupational Therapy Session Note  Patient Details  Name: [name] MRN: [id number] Date of Birth: 1950-12-15  Today's Date: 09/04/2013 Time: 1194-1740 Time Calculation (min): 30 min  Short Term Goals: Week 2:  OT Short Term Goal 1 (Week 2): Pt will complete bathing with min assist at sit > stand level OT Short Term Goal 2 (Week 2): Pt will complete LB dressing with mod assist at sit > stand level with AE as needed OT Short Term Goal 3 (Week 2): Pt will complete toilet transfer with min assist OT Short Term Goal 4 (Week 2): Pt will complete tub/shower transfer with min assist with use of tub transfer bench OT Short Term Goal 5 (Week 2): Pt will utilize LUE as stabilizer during self-care tasks with <10% cues.  Skilled Therapeutic Interventions/Progress Updates: Patient resting in w/c upon arrival.  Engaged in ambulating with [facility] to bathroom, toilet transfer and toileting, ambulate to sink for grooming tasks then back to w/c.  Focused session on forced use of LLE & LUE during sit><stands, standing balance, lateral weight shifts in stand and squat and decrease overuse of RUE & RLE during all transitional movements.  Therapy Documentation Precautions:  Precautions Precautions: Fall Precaution Comments: Lt hemiparesis Restrictions Weight Bearing Restrictions: No Pain: Denies pain See FIM for current functional status  Therapy/Group: Individual Therapy  [name] 09/04/2013, 4:55 PM

## 2013-09-04 NOTE — Progress Notes (Signed)
Occupational Therapy Session Note  Patient Details  Name: [name] MRN: [id number] Date of Birth: 07/29/1950  Today's Date: 09/04/2013 Time: 1829-9371 and 6967-8938 Time Calculation (min): 60 min and 30 min  Short Term Goals: Week 2:  OT Short Term Goal 1 (Week 2): Pt will complete bathing with min assist at sit > stand level OT Short Term Goal 2 (Week 2): Pt will complete LB dressing with mod assist at sit > stand level with AE as needed OT Short Term Goal 3 (Week 2): Pt will complete toilet transfer with min assist OT Short Term Goal 4 (Week 2): Pt will complete tub/shower transfer with min assist with use of tub transfer bench OT Short Term Goal 5 (Week 2): Pt will utilize LUE as stabilizer during self-care tasks with <10% cues.  Skilled Therapeutic Interventions/Progress Updates:    1) Engaged in ADL retraining with focus on functional transfers, sit > stand, and hemi-technique.  Pt's daughter, Aniceto Boss, present upon arrival requesting to be cleared to take pt to bathroom.  Demonstrated appropriate technique for ambulation with quad cane and educated on decreasing distractions during ambulation and only talking when providing verbal cue as needed.  Pt with 1 LOB requiring max assist to regain during ambulation with therapist into bathroom due to distracted and attempting to tell daughter something, reiterated decreasing distractions until mobility improves.  Daughter return demonstrated with proper positioning of self during ambulation/transfer.  Pt completed bathing and dressing at sit > stand level at sink with improved sit to stand and carryover of LUE placement during transition and in standing to increase posture.  Performed tub bench transfer in ADL bathroom with ambulating into bathroom with quad cane with min assist, educated on use of leg lifter to bring LLE into tub, however pt still unable to clear Lt foot without assistance.  Educated pt on recommended supervision for all mobility,  transfers, and self-care tasks.  2) NMR in sitting, supine, and sidelying with focus on trunk coordination, postural control, and LUE activation.  Pt performed sit > stand without use of BUE x5 with focus on trunk control and increased activation of LLE.  On therapy mat engaged in WB through Lt shoulder and elbow in sidelying to challenge trunk control and weight bearing through LUE.  Pt required manual facilitation to achieve and hold position.  Engaged in bridging in supine with focus on trunk control and awareness of LLE placement and participation in task.  Bed mobility with sidelying to sit from Rt side with pt requiring mod (lifting) assist.  Pt with trace shoulder flexion this session post weight bearing.  Therapy Documentation Precautions:  Precautions Precautions: Fall Precaution Comments: Lt hemiparesis Restrictions Weight Bearing Restrictions: No Pain:  Pt with no c/o pain  See FIM for current functional status  Therapy/Group: Individual Therapy  Kortez Murtagh, [place] 09/04/2013, 10:03 AM

## 2013-09-04 NOTE — Progress Notes (Signed)
Physical Therapy Session Note  Patient Details  Name: [name] MRN: [id number] Date of Birth: 1950-03-05  Today's Date: 09/04/2013 Time: 3149-7026 Time Calculation (min): 61 min  Short Term Goals: Week 2:     Skilled Therapeutic Interventions/Progress Updates:   Pt received sitting in w/c in room, agreeable to therapy session.  Pt self propelled to therapy gym using R hemi technique at S level x 150'.  Skilled session focused on gait training with LBQC, L AFO (reaction) and newly added surgical shoe cover to increase foot clearance during swing phase of gait, as well as NMR through tall kneeling and quadruped.  Ambulated x 87' (broken up to add shoe cover) at min A level with assist at trunk/ribs for increased weight shift to LLE during L stance and intermittent tactile cue to L quad for increase knee extension during L stance.  Noted marked improvement in L clearance when added shoe cover and discussed w/ pt that we may need to have shoes modified and add a toe cap if still lacking DF/clearance.  Ended session with tall kneeling/quadruped, see full details below.  Assisted back to room and left in w/c with all needs in reach.   Therapy Documentation Precautions:  Precautions Precautions: Fall Precaution Comments: Lt hemiparesis Restrictions Weight Bearing Restrictions: No   Vital Signs: Therapy Vitals Temp: 98.5 F (36.9 C) Temp src: Oral Pulse Rate: 70 Resp: 18 BP: 124/66 mmHg Patient Position (if appropriate): Sitting Oxygen Therapy SpO2: 98 % O2 Device: None (Room air) Pain: Pt with no stated pain during session.    Locomotion : Ambulation Ambulation/Gait Assistance: 4: Min Oceanographer: 150    Other Treatments: Treatments Therapeutic Activity: Performed tall kneeling activity with small blue bench placed in front of her while working on reaching activity to increase WB and weight shift to LUE and LE.  Therapist providing assist at LUE to  ensure safety at shoulder and adequate WB during reaching.  Transitioned to quadruped with therapist assisting at [place] w/ dysom under L hand to stabilize position while reaching forward and backward with RUE Neuromuscular Facilitation: Left;Upper Extremity;Lower Extremity;Forced use;Activity to increase sustained activation;Activity to increase lateral weight shifting;Activity to increase anterior-posterior weight shifting Weight Bearing Technique Weight Bearing Technique: Yes RUE Weight Bearing Technique: High kneeling;Quadruped LUE Weight Bearing Technique: High kneeling;Quadruped Response to Weight Bearing Technique: tolerated tall kneeling and quadruped well  See FIM for current functional status  Therapy/Group: Individual Therapy  [name] 09/04/2013, 4:19 PM

## 2013-09-05 ENCOUNTER — Inpatient Hospital Stay (HOSPITAL_COMMUNITY): Payer: Medicare Other | Admitting: Physical Therapy

## 2013-09-05 ENCOUNTER — Encounter (HOSPITAL_COMMUNITY): Payer: Medicare Other | Admitting: Occupational Therapy

## 2013-09-05 DIAGNOSIS — I6330 Cerebral infarction due to thrombosis of unspecified cerebral artery: Secondary | ICD-10-CM

## 2013-09-05 DIAGNOSIS — I10 Essential (primary) hypertension: Secondary | ICD-10-CM

## 2013-09-05 DIAGNOSIS — Z5189 Encounter for other specified aftercare: Secondary | ICD-10-CM

## 2013-09-05 MED ORDER — TROLAMINE SALICYLATE 10 % EX CREA
TOPICAL_CREAM | Freq: Two times a day (BID) | CUTANEOUS | Status: DC | PRN
  Filled 2013-09-05: qty 85

## 2013-09-05 MED ORDER — MUSCLE RUB 10-15 % EX CREA
TOPICAL_CREAM | Freq: Two times a day (BID) | CUTANEOUS | Status: DC | PRN
  Filled 2013-09-05: qty 85

## 2013-09-05 NOTE — Progress Notes (Signed)
Physical Therapy Weekly Progress Note  Patient Details  Name: SAMANVITHA GERMANY MRN: [id number] Date of Birth: October 15, 1950  Beginning of progress report period: August 27, 2013 End of progress report period: September 05, 2013  Today's Date: 09/05/2013 Time: 2706-2376 and 2831-5176 Time Calculation (min): 60 min and 60 min  Patient has met 3 of 5 short term goals.  Long term goals addressing bed mobility partially met secondary to pt requiring from supervision to mod A with supine<>sit and requiring min-mod cueing for technique, sequencing. Pt has demonstrated marked improvement in stability/independence with functional transfers, gait, w/c mobility, and stair negotiation since PT evaluation.  Patient continues to demonstrate the following deficits:impaired timing and sequencing, unbalanced muscle activation and decreased motor planning, decreased standing balance, decreased postural control, hemiplegia and decreased balance strategies and therefore will continue to benefit from skilled PT intervention to enhance overall performance with balance, postural control, ability to compensate for deficits and functional use of  left upper extremity and left lower extremity.  Patient progressing toward long term goals..  Plan of care revisions: Upgraded bed mobility goal.  PT Short Term Goals Week 1:  PT Short Term Goal 1 (Week 1): Pt will perform supine>sit with with HOB flat using rail requiring min A and 25% cueing for sequencing. PT Short Term Goal 1 - Progress (Week 1): Partly met PT Short Term Goal 2 (Week 1): Pt will perform sit>supine with HOB flat requiring min A and 25% cueing for sequencing. PT Short Term Goal 2 - Progress (Week 1): Partly met PT Short Term Goal 3 (Week 1): Pt will perform bed<>chair transfer with max A and 50% cueing for technique and sequencing. PT Short Term Goal 3 - Progress (Week 1): Met PT Short Term Goal 4 (Week 1): Pt will perform w/c mobility x150' in controlled environment  with supervision and 25% cueing for technique. PT Short Term Goal 4 - Progress (Week 1): Met PT Short Term Goal 5 (Week 1): Pt will perform gait x50' in controlled environment with Total A of single therapist and 75% cueing. PT Short Term Goal 5 - Progress (Week 1): Met Week 2:  PT Short Term Goal 1 (Week 2): Pt will consistently perform supine>sit with supervision and 25% cueing for technique. PT Short Term Goal 2 (Week 2): Pt will consistently perform sit>supine with supervision and 25% cueing for technique. PT Short Term Goal 3 (Week 2): Pt will perform sit>stand with supervision and 25% cueing for setup. PT Short Term Goal 4 (Week 2): Pt will perform dynamic standing balance x1 minute without UE support with min A. PT Short Term Goal 5 (Week 2): Pt will perform gait x150' in controlled environment with LRAD and min guard.  Skilled Therapeutic Interventions/Progress Updates:    Treatment Session 1:Pt received seated in w/c with daughter present; agreeable to therapy. Session focused on continued hands-on family training with daughter. During self-care,pt performed dynamic standing x2 trials (1-1.5 minutes per trial) with RUE support and close supervision to min guard for stability/balance. Explained, demonstrated how to don L AFO; daughter verbalized understanding and pt donned L AFO with mod A.   Pt performed w/c mobility x150' in controlled environment with R hemi technique and supervision. In ortho gym, therapist explained and demonstrated safe technique for assist with negotiation of 3 stairs with R rail, forward-facing with step-to pattern and close supervision to min guard. Daughter required mod verbal/demonstration cueing for body position and hand placement while providing min guard during descent. Therapist explained, demonstrated  squat pivot transfer from w/c<>simulated car with min A for LLE management, increased time. Daughter then provided min A with car transfer with min cueing for  technique. Pt required with w/c setup and techniques for LLE management. Per pt request to use leg lifter to manage LLE during transfer, educated pt/daughter on importance of continuing to address impairments during rehab stay, waiting until closer to D/C to compensate for LLE weakness. Pt/daughter verbalized understanding and were in full agreement. Session ended in pt room, where pt was left seated in w/c with daughter present and all needs within reach.  Treatment Session 2: Pt received seated in w/c; agreeable to therapy. Session focused on bed mobility, floor transfer, and sit<>stand transfer. In rehab apartment, educated pt on fall recovery, situations in which to activate EMS after fall. Explained, demonstrated floor transfer (seated on couch<>long sitting on mat on floor) for fall recovery and quadruped position (to elicit LUE/LLE weightbearing, activation, proprioception). Pt then performed floor transfer with max A overall and 75% multimodal cueing.   In treatment gym, performed blocked practice of supine<>sit on mat table. Pt initially required min A and 25-50% cueing during supine>sit for LUE positioning to initiate movement of RUE across midline, and for bilat hip/knee flexion to initiate rolling; with sit>supine, pt required cueing for body positioning to facilitate effective LLE management using RLE. Pt with effective within-session carryover, as exhibited by final supine<>sit with supervision and subtle cueing. See below for detailed description of NMR. Session ended in pt room, were pt was left seated in w/c with all needs within reach. Recommended that pt wear L AFO during all transfers to decrease risk of falling.  Therapy Documentation Precautions:  Precautions Precautions: Fall Precaution Comments: Lt hemiparesis Restrictions Weight Bearing Restrictions: No Vital Signs: Therapy Vitals Temp: 98.2 F (36.8 C) Temp src: Oral Pulse Rate: 88 Resp: 17 BP: 145/80 mmHg Patient  Position (if appropriate): Sitting Oxygen Therapy SpO2: 95 % O2 Device: None (Room air) Pain:  Pt reports no pain during AM and PM physical therapy sessions. NMR: Focused on transitional movements for motor control, grading of movement. Performed multiple trials of sit<>stand transfers without UE support; tactile cueing at L ribcage, R posterior pelvis to emphasize erect trunk flexion; with multimodal cueing for L knee control. Focused on slow, controlled performance of select ranges of transfer to increase active control of trunk and L knee.  See FIM for current functional status  Therapy/Group: Individual Therapy  Hobble, Malva Cogan 09/05/2013, 8:22 PM

## 2013-09-05 NOTE — Progress Notes (Signed)
Occupational Therapy Session Note  Patient Details  Name: [name] MRN: [id number] Date of Birth: January 20, 1951  Today's Date: 09/05/2013 Time: 1100-1200 Time Calculation (min): 60 min  Short Term Goals: Week 2:  OT Short Term Goal 1 (Week 2): Pt will complete bathing with min assist at sit > stand level OT Short Term Goal 2 (Week 2): Pt will complete LB dressing with mod assist at sit > stand level with AE as needed OT Short Term Goal 3 (Week 2): Pt will complete toilet transfer with min assist OT Short Term Goal 4 (Week 2): Pt will complete tub/shower transfer with min assist with use of tub transfer bench OT Short Term Goal 5 (Week 2): Pt will utilize LUE as stabilizer during self-care tasks with <10% cues.  Skilled Therapeutic Interventions/Progress Updates:    Pt sitting in w/c upon arrival.  Pt stated that she had already "cleaned up" and dressed.  Pt requested to use toilet and amb with QC to bathroom for toileting.  Pt transitioned to therapy gym for LUE weight bearing and tasks to facilitate increased strength/movement.  Pt initially performed reaching tasks with RUE while weight bearing thru left elbow and transitioned to reaching while weight bearing thru LUE while support by therapist.  Pt transitioned to closed chain reaching tasks to facilitate shoulder flexion/extension.  Slight flexion/extension observed.  Pt reported that she could "feel" the movement.  Focus on functional amb with RW, dynamic standing balance, sitting balance, increased LUE use, and safety awareness.  Therapy Documentation Precautions:  Precautions Precautions: Fall Precaution Comments: Lt hemiparesis Restrictions Weight Bearing Restrictions: No  See FIM for current functional status  Therapy/Group: Individual Therapy  Leroy Libman 09/05/2013, 12:18 PM

## 2013-09-05 NOTE — Progress Notes (Signed)
Subjective/Complaints: 63 y.o. right handed female with history of hypertension. Patient lives alone and was independent prior to admission. Admitted 08/23/2013 with left-sided weakness of acute onset after returning home from work as well as slurred speech. MRI showed acute infarction within the right posterior limb internal capsule/corona radiata. MRA of the head with no major occlusion or stenosis. Echocardiogram with ejection fraction of 48% grade 1 diastolic dysfunction. Carotid Dopplers with no ICA stenosis. Patient did not receive TPA. Neurology consulted placed on aspirin for CVA prophylaxis as well as enrollment in the Point trial study.   Left side neck pain, no prior hx, no trauma, no radiation down L arm   Review of Systems - Negative except frequent urination no burning  Objective: Vital Signs: Blood pressure 129/76, pulse 67, temperature 98.4 F (36.9 C), temperature source Oral, resp. rate 16, height 5\' 3"  (1.6 m), weight 110.6 kg (243 lb 13.3 oz), SpO2 94.00%. No results found. Results for orders placed during the hospital encounter of 08/26/13 (from the past 72 hour(s))  GLUCOSE, CAPILLARY     Status: None   Collection Time    09/02/13  4:45 PM      Result Value Ref Range   Glucose-Capillary 97  70 - 99 mg/dL   Comment 1 Notify RN     Comment 2 Documented in Chart       HEENT: normal Cardio: RRR Resp: CTA B/L GI: BS positive Extremity:  No Edema Skin:   Intact Neuro: Flat, Abnormal Motor 0/5 LUE, 1+ L HF, 0/5 L foot/ankle , 3- Knee ext with Hip ext synergy  and Dysarthric Musc/Skel:  Normal, left shoulder no increased tone, no pain with ext rotation, + pain with abduction Gen NAD   Assessment/Plan: 1. Functional deficits secondary to R PLIC infarct which require 3+ hours per day of interdisciplinary therapy in a comprehensive inpatient rehab setting. Physiatrist is providing close team supervision and 24 hour management of active medical problems listed  below. Physiatrist and rehab team continue to assess barriers to discharge/monitor patient progress toward functional and medical goals.  FIM: FIM - Bathing Bathing Steps Patient Completed: Chest;Left Arm;Abdomen;Front perineal area;Right upper leg;Left upper leg;Buttocks Bathing: 3: Mod-Patient completes 5-7 11f 10 parts or 50-74%  FIM - Upper Body Dressing/Undressing Upper body dressing/undressing steps patient completed: Thread/unthread right sleeve of pullover shirt/dresss;Thread/unthread left sleeve of pullover shirt/dress;Put head through opening of pull over shirt/dress;Pull shirt over trunk Upper body dressing/undressing: 5: Set-up assist to: Obtain clothing/put away FIM - Lower Body Dressing/Undressing Lower body dressing/undressing steps patient completed: Thread/unthread right underwear leg;Thread/unthread right pants leg;Thread/unthread left pants leg;Pull pants up/down;Don/Doff right shoe Lower body dressing/undressing: 3: Mod-Patient completed 50-74% of tasks  FIM - Toileting Toileting steps completed by patient: Adjust clothing prior to toileting;Performs perineal hygiene;Adjust clothing after toileting Toileting Assistive Devices: Grab bar or rail for support Toileting: 4: Steadying assist  FIM - Radio producer Devices: Grab bars;Oncologist Transfers: 4-To toilet/BSC: Min A (steadying Pt. > 75%);4-From toilet/BSC: Min A (steadying Pt. > 75%)  FIM - Bed/Chair Transfer Bed/Chair Transfer Assistive Devices: Arm rests;Cane [facility]) Bed/Chair Transfer: 3: Sit > Supine: Mod A (lifting assist/Pt. 50-74%/lift 2 legs);3: Chair or W/C > Bed: Mod A (lift or lower assist);4: Supine > Sit: Min A (steadying Pt. > 75%/lift 1 leg);3: Bed > Chair or W/C: Mod A (lift or lower assist)  FIM - Locomotion: Wheelchair Distance: 150 Locomotion: Wheelchair: 5: Travels 150 ft or more: maneuvers on rugs and over door sills  with supervision, cueing or coaxing FIM -  Locomotion: Ambulation Locomotion: Ambulation Assistive Devices: Cane - Quad;Orthosis Ambulation/Gait Assistance: 4: Min assist Locomotion: Ambulation: 2: Travels 50 - 149 ft with minimal assistance (Pt.>75%)  Comprehension Comprehension Mode: Auditory Comprehension: 5-Understands basic 90% of the time/requires cueing < 10% of the time  Expression Expression Mode: Verbal Expression: 5-Expresses complex 90% of the time/cues < 10% of the time  Social Interaction Social Interaction: 6-Interacts appropriately with others with medication or extra time (anti-anxiety, antidepressant).  Problem Solving Problem Solving: 5-Solves complex 90% of the time/cues < 10% of the time  Memory Memory: 5-Recognizes or recalls 90% of the time/requires cueing < 10% of the time  Medical Problem List and Plan:  1. Functional deficits secondary to right PLIC infarcts secondary to small vessel disease  2. DVT Prophylaxis/Anticoagulation: SCDs. Monitor for any signs of DVT  3. Pain Management: Tylenol as needed, Left neck pain related to severe weakness LUE, Kpad, analgesic topical ordered  4. Hypertension. Lopressor 12.5 mg twice a day. Monitor with increased mobility  5. Neuropsych: This patient is capable of making decisions on her own behalf.  6. Hyperlipidemia. Lipitor   LOS (Days) 10 A FACE TO FACE EVALUATION WAS PERFORMED  Valborg Friar E 09/05/2013, 7:21 AM

## 2013-09-05 NOTE — Plan of Care (Signed)
Problem: RH SAFETY Goal: RH STG DECREASED RISK OF FALL WITH ASSISTANCE STG Decreased Risk of Fall With minimal Assistance.  Outcome: Not Progressing Patient fell with mod assist with quad cane ambulating back from bathroom. Left leg instability and weakness. Educated patient on using bedside commode if left leg is weak and unstable at HS. adm

## 2013-09-05 NOTE — Progress Notes (Signed)
09/05/13 0215  What Happened  Was fall witnessed? Yes  Who witnessed fall? (Jamal Steel,NT)  Patients activity before fall ambulating-assisted  Point of contact arm/shoulder (left)  Was patient injured? No  Follow Up  MD notified Jeannene Patella Love, PA notified)  Time MD notified [phone]  Family notified Yes-comment (Daughter did not answer phone/ left message)  Time family notified [phone]  Additional tests No  Adult Fall Risk Assessment  Risk Factor Category (scoring not indicated) Fall has occurred during this admission (document High fall risk)  Age 63  Fall History: Fall within 6 months prior to admission 0  Elimination; Bowel and/or Urine Incontinence 0  Elimination; Bowel and/or Urine Urgency/Frequency 2  Medications: includes PCA/Opiates, Anti-convulsants, Anti-hypertensives, Diuretics, Hypnotics, Laxatives, Sedatives, and Psychotropics 3  Patient Care Equipment 2  Mobility-Assistance 2  Mobility-Gait 2  Mobility-Sensory Deficit 0  Cognition-Awareness 0  Cognition-Impulsiveness 0  Cognition-Limitations 0  Total Score 12  Patient's Fall Risk Moderate Fall Risk (6-13 points)  Adult Fall Risk Interventions  Required Bundle Interventions *See Row Information* Moderate fall risk - low and moderate requirements implemented  Vitals  Temp 98 F (36.7 C)  Temp src Oral  BP ! 141/70 mmHg  BP Location Right arm  BP Method Automatic  Patient Position (if appropriate) Sitting  Pulse Rate 74  Pulse Rate Source Dinamap  Resp 18  Oxygen Therapy  SpO2 97 %  O2 Device None (Room air)  Pain Assessment  Pain Score 0  Neurological  Level of Consciousness Alert  Orientation Level Oriented X4  Cognition Follows commands  Speech Clear  Pupil Assessment  Yes  R Pupil Size (mm) 3  R Pupil Shape Round  R Pupil Reaction Brisk  L Pupil Size (mm) 3  L Pupil Shape Round  L Pupil Reaction Brisk  Additional Pupil Assessments No  Facial Droop Left  R Hand Grip Strong  L Hand Grip Absent   RUE  Motor Response Purposeful movement  RUE Sensation Full sensation  LUE Motor Response No movement to painful stimulus  LUE Sensation Decreased  RLE Motor Response Purposeful movement  RLE Sensation Full sensation  LLE Motor Response Purposeful movement  LLE Sensation Decreased  Musculoskeletal  Musculoskeletal (WDL) X  Assistive Device Wheelchair  Generalized Weakness Yes  Musculoskeletal Details  RUE Full movement  LUE Paralysis  RLE Full movement  LLE Weakness  Integumentary  Integumentary (WDL) X  Skin Color Appropriate for ethnicity  Skin Integrity Skin tear  Skin Tear Location Leg  Skin Tear Location Orientation Left;Mid  Skin Tear Intervention Thin film

## 2013-09-05 NOTE — Progress Notes (Addendum)
Please note that Dwaine Deter ,LPN was caregiver and assessed pt at time of fall.Note of her documentation was copied to progress note, in previous entry for [id number]

## 2013-09-05 NOTE — Progress Notes (Signed)
Patient ambulated to bathroom with 1+ mod assist with quad cane to bathroom. Patient is flaccid on left upper extremity, and weak on left lower extremity.  According to NT report, patient ambulated to bathroom at a slow steady pace without difficulty going to bathroom. Upon ambulating back to bed, patients left leg buckled underneath her and she fell on her left side and bumped head. NT notified nurse to come to patients room. Staff assisted patient getting up from floor to bed. Vital signs assessed and stable. Patient stated she was not hurt during assessment. Educated patient on using bedside commode at HS due to difference in energy level between day and nightime hours. Changed safety plan in room to reduce falls in the future. Notifed patients daughter but no answer. Notified Algis Liming, PA  and charge nurse. Charge nurse notified house coverage. adm

## 2013-09-05 NOTE — Progress Notes (Signed)
Social Work Patient ID: [name], female   DOB: 08/10/1950, 63 y.o.   MRN: [id number] Met with pt who fell last night, she reports she worried all night due to being on a blood thinner.  Staff have been checking her every hour and she reports she didn't hurt herself. She is tired now and wants to be finished for the day and go take a nap.  Provided support and will continue, she feels her confidence has suffered now and needs to build herself back up.

## 2013-09-06 ENCOUNTER — Inpatient Hospital Stay (HOSPITAL_COMMUNITY): Payer: Medicare Other | Admitting: Physical Therapy

## 2013-09-06 DIAGNOSIS — I10 Essential (primary) hypertension: Secondary | ICD-10-CM

## 2013-09-06 DIAGNOSIS — I6330 Cerebral infarction due to thrombosis of unspecified cerebral artery: Secondary | ICD-10-CM

## 2013-09-06 DIAGNOSIS — Z5189 Encounter for other specified aftercare: Secondary | ICD-10-CM

## 2013-09-06 NOTE — Progress Notes (Signed)
Physical Therapy Session Note  Patient Details  Name: [name] MRN: [id number] Date of Birth: [birth year]  Today's Date: 09/06/2013 Time:  1008-1108 Time Calculation (min): 60 min  Short Term Goals: Week 2:  PT Short Term Goal 1 (Week 2): Pt will consistently perform supine>sit with supervision and 25% cueing for technique. PT Short Term Goal 2 (Week 2): Pt will consistently perform sit>supine with supervision and 25% cueing for technique. PT Short Term Goal 3 (Week 2): Pt will perform sit>stand with supervision and 25% cueing for setup. PT Short Term Goal 4 (Week 2): Pt will perform dynamic standing balance x1 minute without UE support with min A. PT Short Term Goal 5 (Week 2): Pt will perform gait x150' in controlled environment with LRAD and min guard.  Skilled Therapeutic Interventions/Progress Updates:    Pt received seated in w/c; agreeable to therapy. Session focused on increasing activation of L ankle dorsiflexors to increase independence and stability with gait. Pt performed w/c mobility x200' total in controlled environment with R hemi technique and supervision.  ee below for detailed description of NMR and gait training interventions. Following NMR/pre-gait, pt performed gait x40' in controlled environment with [facility] and no AFO with min-mod A (to recover from posterior LOB); cueing focused on lateral weight shift to R prior to initiating LLE advancement. Noted improved LLE clearance during initial gait trial; however, suspect improvement due to increased L hip/knee flexion and L great toe extension. Limited L subtalar eversion also noted. Donned Velcro calf-based toe lifter to LLE for ankle dorsiflexion assist (biased position to increase eversion). Performed gait x32' in controlled environment with [facility] and min A with noted improved LLE clearance, sufficient eversion, and no excessive use of compensatory strategies. Cueing focused on increasing abduction during LLE swing/L foot  placement. Session ended in pt room, where pt was left seated in w/c with all needs within reach.  Therapy Documentation Precautions:  Precautions Precautions: Fall Precaution Comments: Lt hemiparesis Restrictions Weight Bearing Restrictions: No Vital Signs: Therapy Vitals Temp: 98.2 F (36.8 C) Temp src: Oral Pulse Rate: 71 Resp: 16 BP: 113/65 mmHg Patient Position (if appropriate): Sitting Oxygen Therapy SpO2: 97 % O2 Device: None (Room air) Pain:  Pt reports no pain during this PT session. NMR: NMES: applied 2 medium electrode pads to L tibialis anterior (one pad at distal tendon; other pad 1.5" above). Effective contraction of L tibialis anterior achieved with parameters of 8 mA; 1:3 duty cycle, 2-second ramp, 50 pps. Performed LLE NMR x20 minutes total, initially focusing on L ankle dorsiflexion AAROM in gravity-eliminated position then progressed to dorsiflexion AAROM with manual assist against gravity (increased amplitude to 11 mA to achieve effective muscle contraction).  Gait Training: With NMES at L tibialis anterior as described above, pt performed pre-gait activities with LUE support. Gait training focused on blocked practice of LLE advancement with manual assist of L ankle dorsiflexion maintained from L initial swing through heel strike.  See FIM for current functional status  Therapy/Group: Individual Therapy  Hobble, Malva Cogan 09/06/2013, 6:15 PM

## 2013-09-06 NOTE — Progress Notes (Signed)
Subjective/Complaints: 63 y.o. right handed female with history of hypertension. Patient lives alone and was independent prior to admission. Admitted 08/23/2013 with left-sided weakness of acute onset after returning home from work as well as slurred speech. MRI showed acute infarction within the right posterior limb internal capsule/corona radiata. MRA of the head with no major occlusion or stenosis. Echocardiogram with ejection fraction of 28% grade 1 diastolic dysfunction. Carotid Dopplers with no ICA stenosis. Patient did not receive TPA. Neurology consulted placed on aspirin for CVA prophylaxis as well as enrollment in the Point trial study.   Left shoulder, face a little sore,     Review of Systems - Negative except frequent urination no burning  Objective: Vital Signs: Blood pressure 123/75, pulse 69, temperature 98 F (36.7 C), temperature source Oral, resp. rate 16, height 5\' 3"  (1.6 m), weight 110.6 kg (243 lb 13.3 oz), SpO2 98.00%. No results found. No results found for this or any previous visit (from the past 72 hour(s)).   HEENT: normal Cardio: RRR Resp: CTA B/L GI: BS positive Extremity:  No Edema Skin:   Intact Neuro: Flat, Abnormal Motor 0/5 LUE, 1+ L HF, 0/5 L foot/ankle , 3- Knee ext with Hip ext synergy  and Dysarthric Musc/Skel:  Normal, left shoulder no increased tone, no pain with ext rotation, minimal pain with abduction. No swelling bruising on face or left shoulder Gen NAD   Assessment/Plan: 1. Functional deficits secondary to R PLIC infarct which require 3+ hours per day of interdisciplinary therapy in a comprehensive inpatient rehab setting. Physiatrist is providing close team supervision and 24 hour management of active medical problems listed below. Physiatrist and rehab team continue to assess barriers to discharge/monitor patient progress toward functional and medical goals.  FIM: FIM - Bathing Bathing Steps Patient Completed: Chest;Left  Arm;Abdomen;Front perineal area;Right upper leg;Left upper leg;Buttocks Bathing: 3: Mod-Patient completes 5-7 41f 10 parts or 50-74%  FIM - Upper Body Dressing/Undressing Upper body dressing/undressing steps patient completed: Thread/unthread right sleeve of pullover shirt/dresss;Thread/unthread left sleeve of pullover shirt/dress;Put head through opening of pull over shirt/dress;Pull shirt over trunk Upper body dressing/undressing: 5: Set-up assist to: Obtain clothing/put away FIM - Lower Body Dressing/Undressing Lower body dressing/undressing steps patient completed: Thread/unthread right underwear leg;Thread/unthread right pants leg;Thread/unthread left pants leg;Pull pants up/down;Don/Doff right shoe Lower body dressing/undressing: 3: Mod-Patient completed 50-74% of tasks  FIM - Toileting Toileting steps completed by patient: Adjust clothing prior to toileting;Performs perineal hygiene;Adjust clothing after toileting Toileting Assistive Devices: Grab bar or rail for support Toileting: 4: Steadying assist  FIM - Radio producer Devices: Grab bars;Oncologist Transfers: 4-To toilet/BSC: Min A (steadying Pt. > 75%);4-From toilet/BSC: Min A (steadying Pt. > 75%)  FIM - Bed/Chair Transfer Bed/Chair Transfer Assistive Devices: Orthosis;Cane;Arm rests (LBQC; L AFO) Bed/Chair Transfer: 4: Chair or W/C > Bed: Min A (steadying Pt. > 75%);4: Bed > Chair or W/C: Min A (steadying Pt. > 75%);4: Supine > Sit: Min A (steadying Pt. > 75%/lift 1 leg);4: Sit > Supine: Min A (steadying pt. > 75%/lift 1 leg)  FIM - Locomotion: Wheelchair Distance: 150 Locomotion: Wheelchair: 5: Travels 150 ft or more: maneuvers on rugs and over door sills with supervision, cueing or coaxing FIM - Locomotion: Ambulation Locomotion: Ambulation Assistive Devices: Cane - Quad;Orthosis Ambulation/Gait Assistance: 4: Min assist Locomotion: Ambulation: 2: Travels 50 - 149 ft with minimal assistance  (Pt.>75%)  Comprehension Comprehension Mode: Auditory Comprehension: 5-Understands basic 90% of the time/requires cueing < 10% of the time  Expression Expression Mode: Verbal Expression: 5-Expresses complex 90% of the time/cues < 10% of the time  Social Interaction Social Interaction Mode: Asleep Social Interaction: 6-Interacts appropriately with others with medication or extra time (anti-anxiety, antidepressant).  Problem Solving Problem Solving Mode: Asleep Problem Solving: 5-Solves basic 90% of the time/requires cueing < 10% of the time  Memory Memory Mode: Asleep Memory: 5-Recognizes or recalls 90% of the time/requires cueing < 10% of the time  Medical Problem List and Plan:  1. Functional deficits secondary to right PLIC infarcts secondary to small vessel disease  2. DVT Prophylaxis/Anticoagulation: SCDs. Monitor for any signs of DVT  3. Pain Management: Tylenol as needed, Left neck pain related to severe weakness LUE, Kpad, analgesic topical ordered  -discussed elevation of LUE  -no apparent injuries related to Friday fall  4. Hypertension. Lopressor 12.5 mg twice a day. Monitor with increased mobility  5. Neuropsych: This patient is capable of making decisions on her own behalf.  6. Hyperlipidemia. Lipitor   LOS (Days) 11 A FACE TO FACE EVALUATION WAS PERFORMED  SWARTZ,ZACHARY T 09/06/2013, 8:37 AM

## 2013-09-07 ENCOUNTER — Inpatient Hospital Stay (HOSPITAL_COMMUNITY): Payer: Medicare Other | Admitting: *Deleted

## 2013-09-07 NOTE — Progress Notes (Signed)
Occupational Therapy Session Note  Patient Details  Name: [name] MRN: [id number] Date of Birth: Aug 03, 1950  Today's Date: 09/07/2013 Time:  -   1100-1200   (60 min)    Short Term Goals: Week 1:  OT Short Term Goal 1 (Week 1): Pt will complete bathing with mod assist of 1 caregiver at sit > stand level OT Short Term Goal 1 - Progress (Week 1): Met OT Short Term Goal 2 (Week 1): Pt will complete LB dressing with max assist of 1 caregiver at sit > stand level OT Short Term Goal 2 - Progress (Week 1): Met OT Short Term Goal 3 (Week 1): Pt will complete UB dressing at mod assist OT Short Term Goal 3 - Progress (Week 1): Met OT Short Term Goal 4 (Week 1): Pt will complete toilet transfer max assist of 1 caregiver OT Short Term Goal 4 - Progress (Week 1): Met OT Short Term Goal 5 (Week 1): Pt will complete 1/3 toileting tasks with max assist of 1 caregiver OT Short Term Goal 5 - Progress (Week 1): Met Week 2:  OT Short Term Goal 1 (Week 2): Pt will complete bathing with min assist at sit > stand level OT Short Term Goal 2 (Week 2): Pt will complete LB dressing with mod assist at sit > stand level with AE as needed OT Short Term Goal 3 (Week 2): Pt will complete toilet transfer with min assist OT Short Term Goal 4 (Week 2): Pt will complete tub/shower transfer with min assist with use of tub transfer bench OT Short Term Goal 5 (Week 2): Pt will utilize LUE as stabilizer during self-care tasks with <10% cues.  Skilled Therapeutic Interventions/Progress Updates:    ) Engaged in ADL retraining with focus on functional transfers, sit > stand, and hemi-technique. Pt completed bathing and dressing at sit > stand level at shower level with improved sit to stand and carryover of LUE placement during transition and in standing to increase posture. Performed tub bench transfer with minimal assis.  Ambulated to bathroom with quad cane with min assist,Pt performed sit > stand without use of RUE x5 with  focus on trunk control and hand held assist of LUE for stabilization..   NMR in sitting, with focus on trunk coordination, postural control, and LUE activation.  Therapy Documentation Precautions:  Precautions Precautions: Fall Precaution Comments: Lt hemiparesis Restrictions Weight Bearing Restrictions: No     Pain:    none        See FIM for current functional status  Therapy/Group: Individual Therapy  Lisa Roca 09/07/2013, 7:59 AM

## 2013-09-07 NOTE — Progress Notes (Signed)
Subjective/Complaints: 63 y.o. right handed female with history of hypertension. Patient lives alone and was independent prior to admission. Admitted 08/23/2013 with left-sided weakness of acute onset after returning home from work as well as slurred speech. MRI showed acute infarction within the right posterior limb internal capsule/corona radiata. MRA of the head with no major occlusion or stenosis. Echocardiogram with ejection fraction of 16% grade 1 diastolic dysfunction. Carotid Dopplers with no ICA stenosis. Patient did not receive TPA. Neurology consulted placed on aspirin for CVA prophylaxis as well as enrollment in the Point trial study.    Getting up to go to the bathroom. No complaints Review of Systems - left side still a little sore  Objective: Vital Signs: Blood pressure 115/72, pulse 64, temperature 98.2 F (36.8 C), temperature source Oral, resp. rate 18, height 5\' 3"  (1.6 m), weight 110.6 kg (243 lb 13.3 oz), SpO2 96.00%. No results found. No results found for this or any previous visit (from the past 72 hour(s)).   HEENT: normal Cardio: RRR Resp: CTA B/L GI: BS positive Extremity:  No Edema Skin:   Intact Neuro: Flat, Abnormal Motor 0/5 LUE, 1+ L HF, 0/5 L foot/ankle , 3- Knee ext with Hip ext synergy  and Dysarthric Musc/Skel:  Normal, left shoulder no increased tone, no pain with ext rotation, minimal pain with abduction. No swelling bruising on face or left shoulder Gen NAD   Assessment/Plan: 1. Functional deficits secondary to R PLIC infarct which require 3+ hours per day of interdisciplinary therapy in a comprehensive inpatient rehab setting. Physiatrist is providing close team supervision and 24 hour management of active medical problems listed below. Physiatrist and rehab team continue to assess barriers to discharge/monitor patient progress toward functional and medical goals.  FIM: FIM - Bathing Bathing Steps Patient Completed: Chest;Left Arm;Abdomen;Front  perineal area;Right upper leg;Left upper leg;Buttocks Bathing: 3: Mod-Patient completes 5-7 10f 10 parts or 50-74%  FIM - Upper Body Dressing/Undressing Upper body dressing/undressing steps patient completed: Thread/unthread right sleeve of pullover shirt/dresss;Thread/unthread left sleeve of pullover shirt/dress;Put head through opening of pull over shirt/dress;Pull shirt over trunk Upper body dressing/undressing: 5: Set-up assist to: Obtain clothing/put away FIM - Lower Body Dressing/Undressing Lower body dressing/undressing steps patient completed: Thread/unthread right underwear leg;Thread/unthread right pants leg;Thread/unthread left pants leg;Pull pants up/down;Don/Doff right shoe Lower body dressing/undressing: 3: Mod-Patient completed 50-74% of tasks  FIM - Toileting Toileting steps completed by patient: Adjust clothing prior to toileting;Performs perineal hygiene;Adjust clothing after toileting Toileting Assistive Devices: Grab bar or rail for support Toileting: 4: Steadying assist  FIM - Radio producer Devices: Grab bars;Oncologist Transfers: 4-To toilet/BSC: Min A (steadying Pt. > 75%);4-From toilet/BSC: Min A (steadying Pt. > 75%)  FIM - Control and instrumentation engineer Devices: Cane;Arm rests [facility]) Bed/Chair Transfer: 4: Chair or W/C > Bed: Min A (steadying Pt. > 75%);4: Bed > Chair or W/C: Min A (steadying Pt. > 75%)  FIM - Locomotion: Wheelchair Distance: 200 Locomotion: Wheelchair: 5: Travels 150 ft or more: maneuvers on rugs and over door sills with supervision, cueing or coaxing FIM - Locomotion: Ambulation Locomotion: Ambulation Assistive Devices: Cane - Quad;Orthosis;Other (comment) (LBQC; L calf-based toe lifter) Ambulation/Gait Assistance: 3: Mod assist Locomotion: Ambulation: 2: Travels 50 - 149 ft with moderate assistance (Pt: 50 - 74%)  Comprehension Comprehension Mode: Auditory Comprehension: 5-Understands complex  90% of the time/Cues < 10% of the time  Expression Expression Mode: Verbal Expression: 5-Expresses complex 90% of the time/cues < 10% of the time  Social Interaction Social Interaction Mode: Asleep Social Interaction: 6-Interacts appropriately with others with medication or extra time (anti-anxiety, antidepressant).  Problem Solving Problem Solving Mode: Asleep Problem Solving: 5-Solves basic 90% of the time/requires cueing < 10% of the time  Memory Memory Mode: Asleep Memory: 5-Recognizes or recalls 90% of the time/requires cueing < 10% of the time  Medical Problem List and Plan:  1. Functional deficits secondary to right PLIC infarcts secondary to small vessel disease  2. DVT Prophylaxis/Anticoagulation: SCDs. Monitor for any signs of DVT  3. Pain Management: Tylenol as needed, Left neck pain related to severe weakness LUE, Kpad, analgesic topical ordered  -continue elevation of LUE  -no apparent injuries related to Friday fall  4. Hypertension. Lopressor 12.5 mg twice a day. Monitor with increased mobility  5. Neuropsych: This patient is capable of making decisions on her own behalf.  6. Hyperlipidemia. Lipitor   LOS (Days) 12 A FACE TO FACE EVALUATION WAS PERFORMED  SWARTZ,ZACHARY T 09/07/2013, 8:08 AM

## 2013-09-08 ENCOUNTER — Inpatient Hospital Stay (HOSPITAL_COMMUNITY): Payer: Medicare Other | Admitting: Occupational Therapy

## 2013-09-08 ENCOUNTER — Encounter (HOSPITAL_COMMUNITY): Payer: Medicare Other | Admitting: Occupational Therapy

## 2013-09-08 ENCOUNTER — Inpatient Hospital Stay (HOSPITAL_COMMUNITY): Payer: Medicare Other | Admitting: Speech Pathology

## 2013-09-08 ENCOUNTER — Inpatient Hospital Stay (HOSPITAL_COMMUNITY): Payer: Medicare Other | Admitting: Physical Therapy

## 2013-09-08 DIAGNOSIS — I10 Essential (primary) hypertension: Secondary | ICD-10-CM

## 2013-09-08 DIAGNOSIS — I6330 Cerebral infarction due to thrombosis of unspecified cerebral artery: Secondary | ICD-10-CM

## 2013-09-08 DIAGNOSIS — Z5189 Encounter for other specified aftercare: Secondary | ICD-10-CM

## 2013-09-08 NOTE — Progress Notes (Signed)
Speech Language Pathology Daily Session Note  Patient Details  Name: [name] MRN: [id number] Date of Birth: 1950/09/05  Today's Date: 09/08/2013 Time: 9528-4132 Time Calculation (min): 30 min  Short Term Goals: Week 2: SLP Short Term Goal 1 (Week 2): Pt will utilize speech intelligibility strategies (slow rate, increased vocal intensity) at the sentence level with supervision cues.  SLP Short Term Goal 2 (Week 2): Pt will utilize diaphragmatic breathing at the phrase level with supervision cues.  SLP Short Term Goal 3 (Week 2): Pt will recall current medications with Mod I.  Skilled Therapeutic Interventions: Skilled treatment session focused on addressing cognition goals and education.  Patient was overall Supervision for speech intelligibility throughout session today.  SLP facilitated session by initially providing Min question cues to assist with recall of current medication, functions and frequencies.  SLP implemented external aids which increased patient's accuracy to Supervision for use.  SLP educated patient on importance of a home medication management system that assists with recall as well as improved ability to self-monitor use, such as a medication box.  Patient asked appropriate questions regarding use of external aid and anticipate a good place to store it at home following discharge with Supervision.  Recommend patient load medication box next session.     FIM:  Comprehension Comprehension Mode: Auditory Comprehension: 5-Understands complex 90% of the time/Cues < 10% of the time Expression Expression Mode: Verbal Expression: 5-Expresses complex 90% of the time/cues < 10% of the time Social Interaction Social Interaction: 6-Interacts appropriately with others with medication or extra time (anti-anxiety, antidepressant). Problem Solving Problem Solving: 5-Solves basic problems: With no assist Memory Memory: 5-Recognizes or recalls 90% of the time/requires cueing < 10% of  the time  Pain Pain Assessment Pain Assessment: No/denies pain Pain Score: 0-No pain  Therapy/Group: Individual Therapy  Carmelia Roller., CCC-SLP [phone]  [place] 09/08/2013, 11:32 AM

## 2013-09-08 NOTE — Progress Notes (Signed)
Patient wants to ambulate to BR, at [facility], can be unsafe because she drags left foot. Patrici Ranks A

## 2013-09-08 NOTE — Progress Notes (Signed)
Recreational Therapy Assessment and Plan  Patient Details  Name: [name] MRN: [id number] Date of Birth: [birth year] Today's Date: 09/08/2013  Rehab Potential: Good ELOS: 2 weeks    Assessment Clinical Impression: Problem List:  Patient Active Problem List    Diagnosis  Date Noted   .  Chronic diastolic heart failure  10/62/6948   .  Dyslipidemia  08/26/2013   .  Borderline diabetes  08/26/2013   .  CVA (cerebral infarction)  08/26/2013   .  Acute CVA (cerebrovascular accident)  08/23/2013   .  Pre-syncope  08/17/2012   .  Hypokalemia  08/17/2012   .  ARTHRITIS  03/28/2010   .  DE QUERVAIN'S TENOSYNOVITIS  03/28/2010   .  Normocytic anemia  07/26/2009   .  REACTIVE AIRWAY DISEASE  03/09/2009   .  DENTAL CARIES  03/09/2009   .  CANDIDIASIS OF VULVA AND VAGINA  06/04/2008   .  SKIN TAG  06/04/2008   .  ARTHRITIS, KNEES, BILATERAL  05/22/2008   .  HYPERTENSION, BENIGN ESSENTIAL  05/04/2008   .  KNEE PAIN, BILATERAL  05/04/2008   .  COUGH  05/04/2008   .  MENISCUS TEAR, RIGHT  12/07/2005    Past Medical History:  Past Medical History   Diagnosis  Date   .  Hypertension    .  Arthritis     Past Surgical History:  Past Surgical History   Procedure  Laterality  Date   .  Joint replacement     .  Replacement total knee  Left     Assessment & Plan  Clinical Impression: [name] is a 63 y.o. right handed female with history of hypertension. Patient lives alone and was independent prior to admission. Admitted 08/23/2013 with left-sided weakness of acute onset after returning home from work as well as slurred speech. MRI showed acute infarction within the right posterior limb internal capsule/corona radiata. MRA of the head with no major occlusion or stenosis. Echocardiogram with ejection fraction of 54% grade 1 diastolic dysfunction. Carotid Dopplers with no ICA stenosis. Patient did not receive TPA. Neurology consulted placed on aspirin for CVA prophylaxis as well as  enrollment in the Point trial study. She is tolerating a regular consistency diet. Patient transferred to CIR on 08/26/2013.   Pt presents with decreased activity tolerance, decreased functional mobility, decreased balance, left sided weakness,  dysarthria Limiting pt's independence with leisure/community pursuits.   Leisure History/Participation Premorbid leisure interest/current participation: [place]ra [place] - Flower gardening;Nature - [place] care;Community - Shopping mall;Community - Grocery store;Games - Other (Comment) (computer) Expression Interests: Music (Comment) Other Leisure Interests: Television;Movies;Reading;Computer;Cooking/Baking;Housework Leisure Participation Style: With Family/Friends;Alone Awareness of Community Resources: Excellent Psychosocial / Spiritual Social interaction - Mood/Behavior: Cooperative Academic librarian Appropriate for Education?: Yes Recreational Therapy Orientation Orientation -Reviewed with patient: Available activity resources Strengths/Weaknesses Patient Strengths/Abilities: Willingness to participate;Active premorbidly Patient weaknesses: Physical limitations TR Patient demonstrates impairments in the following area(s): Endurance;Motor;Safety TR Additional Impairment(s): None  Plan Rec Therapy Plan Is patient appropriate for Therapeutic Recreation?: Yes Rehab Potential: Good Treatment times per week: Min 1 time per week >20 minutes Estimated Length of Stay: 2 weeks  TR Treatment/Interventions: Adaptive equipment instruction;1:1 session;Balance/vestibular training;Functional mobility training;Community reintegration;Patient/family education;Recreation/leisure participation;Therapeutic activities;Visual/perceptual remediation/compensation;UE/LE Coordination activities;Therapeutic exercise  Recommendations for other services: None  Discharge Criteria: Patient will be discharged from TR if patient refuses treatment 3 consecutive times without  medical reason.  If treatment goals not met, if there is a change in medical  status, if patient makes no progress towards goals or if patient is discharged from hospital.  The above assessment, treatment plan, treatment alternatives and goals were discussed and mutually agreed upon: by patient  [place] 09/08/2013, 2:02 PM

## 2013-09-08 NOTE — Progress Notes (Signed)
Occupational Therapy Session Note  Patient Details  Name: [name] MRN: [id number] Date of Birth: 11-14-1950  Today's Date: 09/08/2013 Time: [phone] and [id number] Time Calculation (min): 45 min and 33 min  Short Term Goals: Week 2:  OT Short Term Goal 1 (Week 2): Pt will complete bathing with min assist at sit > stand level OT Short Term Goal 2 (Week 2): Pt will complete LB dressing with mod assist at sit > stand level with AE as needed OT Short Term Goal 3 (Week 2): Pt will complete toilet transfer with min assist OT Short Term Goal 4 (Week 2): Pt will complete tub/shower transfer with min assist with use of tub transfer bench OT Short Term Goal 5 (Week 2): Pt will utilize LUE as stabilizer during self-care tasks with <10% cues.  Skilled Therapeutic Interventions/Progress Updates:    1) Engaged in ADL retraining with focus on functional transfers, sit > stand, and hemi-technique. Pt completed bathing and dressing at sit > stand level at sink with improved sit to stand and carryover of LUE placement during transition and in standing to increase posture. Sit > stand and standing balance SBA for perineal hygiene. Utilized reacher to assist with LB dressing due to inability to lift LLE and weight shift forward to thread Lt pant leg.  Ambulated to toilet with quad cane with min assist, pt able to doff pants but unable to fully pull over hip on Lt requiring assist.    2) NMR in sitting and sit > stand with focus on trunk control, LLE WB and LUE WB.  Engaged in sit > stand x10 without use of UE to address trunk control and BLE activation, challenged trunk control and LLE WB with reaching RUE across midline.  Pt min guard throughout standing task.  Educated on increased placement of LUE and awareness of LUE during standing and reaching task.  In sitting engaged in WB through LUE with therapist providing support to ensure proper placement to facilitate increased WB.  Completed reaching tasks to  facilitate shoulder flexion/extension, noted slight flexion/extension. Pt reported that she could feel the movement in her shoulder.   Therapy Documentation Precautions:  Precautions Precautions: Fall Precaution Comments: Lt hemiparesis Restrictions Weight Bearing Restrictions: No General:   Vital Signs: Therapy Vitals Temp: 98.4 F (36.9 C) Temp src: Oral Pulse Rate: 76 Resp: 18 BP: 106/70 mmHg Patient Position (if appropriate): Lying Oxygen Therapy SpO2: 97 % O2 Device: None (Room air) Pain:  Pt with no c/o pain  See FIM for current functional status  Therapy/Group: Individual Therapy  Simonne Come 09/08/2013, 9:27 AM

## 2013-09-08 NOTE — Progress Notes (Signed)
Subjective/Complaints: 63 y.o. right handed female with history of hypertension. Patient lives alone and was independent prior to admission. Admitted 08/23/2013 with left-sided weakness of acute onset after returning home from work as well as slurred speech. MRI showed acute infarction within the right posterior limb internal capsule/corona radiata. MRA of the head with no major occlusion or stenosis. Echocardiogram with ejection fraction of 50% grade 1 diastolic dysfunction. Carotid Dopplers with no ICA stenosis. Patient did not receive TPA. Neurology consulted placed on aspirin for CVA prophylaxis as well as enrollment in the Point trial study.    Up with therapy this am. No new issues. Had a reasonable night.  Review of Systems - left side still a little sore  Objective: Vital Signs: Blood pressure 129/81, pulse 76, temperature 98.4 F (36.9 C), temperature source Oral, resp. rate 18, height 5\' 3"  (1.6 m), weight 110.6 kg (243 lb 13.3 oz), SpO2 97.00%. No results found. No results found for this or any previous visit (from the past 72 hour(s)).   HEENT: normal Cardio: RRR Resp: CTA B/L GI: BS positive Extremity:  No Edema Skin:   Intact Neuro: Flat, Abnormal Motor 0/5 LUE, 1+ L HF, 0/5 L foot/ankle , 3- Knee ext with Hip ext synergy  and Dysarthric Musc/Skel:  Normal, left shoulder no increased tone, no pain with ext rotation, minimal pain with abduction. No swelling bruising on face or left shoulder Gen NAD   Assessment/Plan: 1. Functional deficits secondary to R PLIC infarct which require 3+ hours per day of interdisciplinary therapy in a comprehensive inpatient rehab setting. Physiatrist is providing close team supervision and 24 hour management of active medical problems listed below. Physiatrist and rehab team continue to assess barriers to discharge/monitor patient progress toward functional and medical goals.  FIM: FIM - Bathing Bathing Steps Patient Completed: Chest;Left  Arm;Abdomen;Front perineal area;Right upper leg;Left upper leg;Buttocks Bathing: 3: Mod-Patient completes 5-7 64f 10 parts or 50-74%  FIM - Upper Body Dressing/Undressing Upper body dressing/undressing steps patient completed: Thread/unthread right sleeve of pullover shirt/dresss;Thread/unthread left sleeve of pullover shirt/dress;Put head through opening of pull over shirt/dress;Pull shirt over trunk Upper body dressing/undressing: 5: Set-up assist to: Obtain clothing/put away FIM - Lower Body Dressing/Undressing Lower body dressing/undressing steps patient completed: Thread/unthread right underwear leg;Thread/unthread right pants leg;Thread/unthread left pants leg;Pull pants up/down Lower body dressing/undressing: 3: Mod-Patient completed 50-74% of tasks  FIM - Toileting Toileting steps completed by patient: Adjust clothing prior to toileting;Performs perineal hygiene;Adjust clothing after toileting Toileting Assistive Devices: Grab bar or rail for support Toileting: 4: Steadying assist  FIM - Radio producer Devices: Grab bars;Oncologist Transfers: 4-To toilet/BSC: Min A (steadying Pt. > 75%);4-From toilet/BSC: Min A (steadying Pt. > 75%)  FIM - Control and instrumentation engineer Devices: Cane;Arm rests [facility]) Bed/Chair Transfer: 4: Chair or W/C > Bed: Min A (steadying Pt. > 75%);4: Bed > Chair or W/C: Min A (steadying Pt. > 75%)  FIM - Locomotion: Wheelchair Distance: 200 Locomotion: Wheelchair: 5: Travels 150 ft or more: maneuvers on rugs and over door sills with supervision, cueing or coaxing FIM - Locomotion: Ambulation Locomotion: Ambulation Assistive Devices: Cane - Quad;Orthosis;Other (comment) (LBQC; L calf-based toe lifter) Ambulation/Gait Assistance: 3: Mod assist Locomotion: Ambulation: 2: Travels 50 - 149 ft with moderate assistance (Pt: 50 - 74%)  Comprehension Comprehension Mode: Auditory Comprehension: 5-Understands complex  90% of the time/Cues < 10% of the time  Expression Expression Mode: Verbal Expression: 5-Expresses complex 90% of the time/cues < 10% of  the time  Social Interaction Social Interaction Mode: Asleep Social Interaction: 6-Interacts appropriately with others with medication or extra time (anti-anxiety, antidepressant).  Problem Solving Problem Solving Mode: Asleep Problem Solving: 5-Solves basic 90% of the time/requires cueing < 10% of the time  Memory Memory Mode: Asleep Memory: 5-Recognizes or recalls 90% of the time/requires cueing < 10% of the time  Medical Problem List and Plan:  1. Functional deficits secondary to right PLIC infarcts secondary to small vessel disease  2. DVT Prophylaxis/Anticoagulation: SCDs. Monitor for any signs of DVT  3. Pain Management: Tylenol as needed, Left neck pain related to severe weakness LUE, Kpad, analgesic topical ordered  -continue elevation of LUE 4. Hypertension. Lopressor 12.5 mg twice a day. Monitor with increased mobility  5. Neuropsych: This patient is capable of making decisions on her own behalf.  6. Hyperlipidemia. Lipitor   LOS (Days) 13 A FACE TO FACE EVALUATION WAS PERFORMED  Saundra Gin T 09/08/2013, 8:27 AM

## 2013-09-08 NOTE — Progress Notes (Signed)
Physical Therapy Session Note  Patient Details  Name: [name] MRN: [id number] Date of Birth: 1950-05-17  Today's Date: 09/08/2013 Time: 1300-1415 Time Calculation (min): 75 min  Short Term Goals: Week 2:  PT Short Term Goal 1 (Week 2): Pt will consistently perform supine>sit with supervision and 25% cueing for technique. PT Short Term Goal 2 (Week 2): Pt will consistently perform sit>supine with supervision and 25% cueing for technique. PT Short Term Goal 3 (Week 2): Pt will perform sit>stand with supervision and 25% cueing for setup. PT Short Term Goal 4 (Week 2): Pt will perform dynamic standing balance x1 minute without UE support with min A. PT Short Term Goal 5 (Week 2): Pt will perform gait x150' in controlled environment with LRAD and min guard.  Skilled Therapeutic Interventions/Progress Updates:    Pt received seated in w/c; agreeable to therapy. Session focused on bed mobility, gait, and stair training. See below for detailed description of NMR and gait training interventions. Pt performed gait 2x50' and 1x121' (initial 2 trials without AFO or dorsiflexion assist; longest trial with calf-based toe lifter for L ankle dorsiflexion/eversion) in controlled environment with [employer] with close supervision to min A; min cueing for upright posture.  In rehab apartment, pt performed blocked practice of supine<>sit on apartment bed. Pt with effective return demonstration of >80% of cueing provided for bed mobility during previous session. With sit>supine, pt required min guard and multiple trials to effectively manage LLE using RLE. During supine>sit, pt required min A, verbal/tactile cueing for bilat knee flexion to increase independence with supine > L side lying. Pt negotiated 5 stairs with R rail, forward-facing with step-to pattern and min A for manual facilitation of L ankle dorsiflexion. Session ended in pt room, where pt was left seated in w/c with all needs within reach.  Therapy  Documentation Precautions:  Precautions Precautions: Fall Precaution Comments: Lt hemiparesis Restrictions Weight Bearing Restrictions: No Pain:  Pt reports no pain during PT session. NMR: Functional electrical stimulation: applied 2 large electrode pads to L tibialis anterior and L peroneus longus to elicit L ankle dorsiflexion/eversion. Optimum gait pattern (LLE clearance without compensation at L hip/knee) achieved with parameters at 36 mA; 1:3 duty cycle, 2-second ramp, 50 pps, x20 minutes total. Pt performed pre-gait activities with RUE support at hallway hand rail. Gait training focused on blocked practice of LLE advancement with manual facilitation of L ankle dorsiflexion/eversion.  See FIM for current functional status  Therapy/Group: Individual Therapy  Hobble, Malva Cogan 09/08/2013, 7:41 PM

## 2013-09-09 ENCOUNTER — Inpatient Hospital Stay (HOSPITAL_COMMUNITY): Payer: Medicare Other | Admitting: *Deleted

## 2013-09-09 ENCOUNTER — Encounter (HOSPITAL_COMMUNITY): Payer: Medicare Other | Admitting: Occupational Therapy

## 2013-09-09 ENCOUNTER — Inpatient Hospital Stay (HOSPITAL_COMMUNITY): Payer: Medicare Other | Admitting: Speech Pathology

## 2013-09-09 ENCOUNTER — Inpatient Hospital Stay (HOSPITAL_COMMUNITY): Payer: Medicare Other | Admitting: Physical Therapy

## 2013-09-09 ENCOUNTER — Inpatient Hospital Stay (HOSPITAL_COMMUNITY): Payer: Medicare Other | Admitting: Occupational Therapy

## 2013-09-09 DIAGNOSIS — I6330 Cerebral infarction due to thrombosis of unspecified cerebral artery: Secondary | ICD-10-CM

## 2013-09-09 DIAGNOSIS — Z5189 Encounter for other specified aftercare: Secondary | ICD-10-CM

## 2013-09-09 DIAGNOSIS — I10 Essential (primary) hypertension: Secondary | ICD-10-CM

## 2013-09-09 MED ORDER — CITALOPRAM HYDROBROMIDE 10 MG PO TABS
10.0000 mg | ORAL_TABLET | Freq: Every day | ORAL | Status: DC
  Filled 2013-09-09 (×3): qty 1

## 2013-09-09 NOTE — Progress Notes (Signed)
Subjective/Complaints: 63 y.o. right handed female with history of hypertension. Patient lives alone and was independent prior to admission. Admitted 08/23/2013 with left-sided weakness of acute onset after returning home from work as well as slurred speech. MRI showed acute infarction within the right posterior limb internal capsule/corona radiata. MRA of the head with no major occlusion or stenosis. Echocardiogram with ejection fraction of 73% grade 1 diastolic dysfunction. Carotid Dopplers with no ICA stenosis. Patient did not receive TPA. Neurology consulted placed on aspirin for CVA prophylaxis as well as enrollment in the Point trial study.    Complains of neck tightness. Also states her mood is down Review of Systems -otherwise neg  Objective: Vital Signs: Blood pressure 115/71, pulse 65, temperature 98.3 F (36.8 C), temperature source Oral, resp. rate 17, height 5\' 3"  (1.6 m), weight 110.6 kg (243 lb 13.3 oz), SpO2 97.00%. No results found. No results found for this or any previous visit (from the past 72 hour(s)).   HEENT: normal Cardio: RRR Resp: CTA B/L GI: BS positive Extremity:  No Edema Skin:   Intact Neuro: Flat, Abnormal Motor 0/5 LUE, 1+ L HF, 0/5 L foot/ankle , 3- Knee ext with Hip ext synergy  and Dysarthric Musc/Skel:  Normal, left shoulder no increased tone, no pain with ext rotation, minimal pain with abduction. No swelling bruising on face or left shoulder Gen NAD   Assessment/Plan: 1. Functional deficits secondary to R PLIC infarct which require 3+ hours per day of interdisciplinary therapy in a comprehensive inpatient rehab setting. Physiatrist is providing close team supervision and 24 hour management of active medical problems listed below. Physiatrist and rehab team continue to assess barriers to discharge/monitor patient progress toward functional and medical goals.  FIM: FIM - Bathing Bathing Steps Patient Completed: Chest;Left Arm;Abdomen;Front perineal  area;Right upper leg;Left upper leg;Buttocks Bathing: 3: Mod-Patient completes 5-7 71f 10 parts or 50-74%  FIM - Upper Body Dressing/Undressing Upper body dressing/undressing steps patient completed: Thread/unthread right sleeve of pullover shirt/dresss;Thread/unthread left sleeve of pullover shirt/dress;Put head through opening of pull over shirt/dress;Pull shirt over trunk Upper body dressing/undressing: 5: Set-up assist to: Obtain clothing/put away FIM - Lower Body Dressing/Undressing Lower body dressing/undressing steps patient completed: Thread/unthread right underwear leg;Thread/unthread right pants leg;Thread/unthread left pants leg;Pull pants up/down Lower body dressing/undressing: 3: Mod-Patient completed 50-74% of tasks  FIM - Toileting Toileting steps completed by patient: Adjust clothing prior to toileting;Performs perineal hygiene Toileting Assistive Devices: Grab bar or rail for support Toileting: 3: Mod-Patient completed 2 of 3 steps  FIM - Radio producer Devices: Grab bars;Oncologist Transfers: 4-To toilet/BSC: Min A (steadying Pt. > 75%);4-From toilet/BSC: Min A (steadying Pt. > 75%)  FIM - Control and instrumentation engineer Devices: Cane;Arm rests [facility]) Bed/Chair Transfer: 4: Chair or W/C > Bed: Min A (steadying Pt. > 75%);4: Bed > Chair or W/C: Min A (steadying Pt. > 75%);4: Supine > Sit: Min A (steadying Pt. > 75%/lift 1 leg);4: Sit > Supine: Min A (steadying pt. > 75%/lift 1 leg)  FIM - Locomotion: Wheelchair Distance: 200 Locomotion: Wheelchair: 1: Total Assistance/staff pushes wheelchair (Pt<25%) FIM - Locomotion: Ambulation Locomotion: Ambulation Assistive Devices: Cane - Quad;Orthosis;Other (comment) [facility]) Ambulation/Gait Assistance: 4: Min assist Locomotion: Ambulation: 2: Travels 50 - 149 ft with moderate assistance (Pt: 50 - 74%)  Comprehension Comprehension Mode: Auditory Comprehension: 5-Understands complex 90%  of the time/Cues < 10% of the time  Expression Expression Mode: Verbal Expression: 5-Expresses complex 90% of the time/cues < 10% of the  time  Social Interaction Social Interaction Mode: Asleep Social Interaction: 4-Interacts appropriately 75 - 89% of the time - Needs redirection for appropriate language or to initiate interaction.  Problem Solving Problem Solving Mode: Asleep Problem Solving: 5-Solves basic problems: With no assist  Memory Memory Mode: Asleep Memory: 5-Recognizes or recalls 90% of the time/requires cueing < 10% of the time  Medical Problem List and Plan:  1. Functional deficits secondary to right PLIC infarcts secondary to small vessel disease  2. DVT Prophylaxis/Anticoagulation: SCDs. Monitor for any signs of DVT  3. Pain Management: Tylenol as needed, Kpad, analgesic topical ordered  -continue elevation of LUE  -needs to use kpad to neck shoulder 4. Hypertension. Lopressor 12.5 mg twice a day. Monitor with increased mobility  5. Neuropsych: This patient is capable of making decisions on her own behalf.   -pt admits to being a little depressed---think it is worth while to add low dose celexa at this time 6. Hyperlipidemia. Lipitor   LOS (Days) 14 A FACE TO FACE EVALUATION WAS PERFORMED  Ricard Faulkner T 09/09/2013, 8:37 AM

## 2013-09-09 NOTE — Progress Notes (Signed)
Recreational Therapy Session Note  Patient Details  Name: [name] MRN: [id number] Date of Birth: [birth year] Today's Date: 09/09/2013  Pain: no c/o Skilled Therapeutic Interventions/Progress Updates: Session focused on activity tolerance, dynamic standing balance, side stepping & safety during horticultural therapy activity using LBQC.  Pt required min assist for balance & side-stepping.  Pt's daughter present & observing session.  Therapy/Group: Individual Therapy   Taliyah Watrous 09/09/2013, 3:15 PM

## 2013-09-09 NOTE — Progress Notes (Signed)
Physical Therapy Session Note  Patient Details  Name: [name] MRN: [id number] Date of Birth: Sep 12, 1950  Today's Date: 09/09/2013 Time: 1102-1202 Time Calculation (min): 60 min  Short Term Goals: Week 2:  PT Short Term Goal 1 (Week 2): Pt will consistently perform supine>sit with supervision and 25% cueing for technique. PT Short Term Goal 2 (Week 2): Pt will consistently perform sit>supine with supervision and 25% cueing for technique. PT Short Term Goal 3 (Week 2): Pt will perform sit>stand with supervision and 25% cueing for setup. PT Short Term Goal 4 (Week 2): Pt will perform dynamic standing balance x1 minute without UE support with min A. PT Short Term Goal 5 (Week 2): Pt will perform gait x150' in controlled environment with LRAD and min guard.  Skilled Therapeutic Interventions/Progress Updates:    Pt received seated in w/c with daughter present; agreeable to therapy. Session focused on bed mobility, transfers, gait, and stair negotiation. Pt performed w/c mobility x180' in controlled and home environments with R hemi technique, increased time, and supervision. In rehab apartment, pt performed sit>supine with min guard, increased time; supine>sit with supervision, increased time. Pt demonstrated effective between-session carryover of all cueing. See below for detailed description of gait training using functional electrical stimulation (FES). Final gait trial (without FES): gait x75' in controlled environnment with LBQC and min guard to min A; verbal cueing focused on lateral weight shift to R, increased LLE abduction. Trial ended secondary to fatigue, onset of L toe drag. Upon arriving in pt's room, pt performed stand pivot transfer from w/c>toilet with [facility] and min A. Daughter agreeable to assisting with transfer from toilet>w/c when pt finished. Nurse tech notified. Pt left seated on toilet in no apparent distress with daughter present in bathroom.  Educated pt/daughter on  importance of positioning w/c in close proximity to toilet when assisted by nursing to bathroom to decrease risk of falling secondary to L toe catch. Pt not in agreement, stating that she prefers to walk to bathroom, as w/c if difficult to fit into bathroom. Therapist therefore recommended that pt wear L AFO (Reaction) when ambulating to bathroom. Pt will need reinforcement.  Therapy Documentation Precautions:  Precautions Precautions: Fall Precaution Comments: Lt hemiparesis Restrictions Weight Bearing Restrictions: No Vital Signs: Therapy Vitals BP: 126/72 mmHg Pain: Pain Assessment Pain Assessment: No/denies pain Locomotion : Ambulation Ambulation/Gait Assistance: 4: Min assist;4: Min Building control surveyor Distance: 180  NMR: Functional Electrical Stimulation (FES): applied 2 large electrode pads to L tibialis anterior and L peroneus longus to elicit L ankle dorsiflexion/eversion. Optimum gait pattern (LLE clearance without compensation at L hip/knee) achieved with parameters at 38 mA; 1:3 duty cycle, 2-second ramp, 50 pps, x20 minutes total.   Pt performed the following interventions while wearing FES on L ankle/lower leg as described above:  - Gait training with RUE support at parallel bars with close supervision to min guard and cueing focused on facilitation of LLE abduction and on selective control of L ankle dorsiflexion with concurrent L knee flexion (during LE advancement) then L knee extension (heel strike). - Gait x94' in controlled and home environments with [facility] and min guard; verbal cueing focused on lateral weight shift to R. - Negotiation of 5 stairs with R rail, forward-facing with step-to pattern and min A, min verbal cueing for increased L hip/knee flexion to facilitate adequate LLE clearance.   See FIM for current functional status  Therapy/Group: Individual Therapy  Hobble, Malva Cogan 09/09/2013, 12:35 PM

## 2013-09-09 NOTE — Progress Notes (Signed)
Occupational Therapy Session Note  Patient Details  Name: [name] MRN: [id number] Date of Birth: 1950-08-21  Today's Date: 09/09/2013 Time: 7867-6720 Time Calculation (min): 60 min  Short Term Goals: Week 2:  OT Short Term Goal 1 (Week 2): Pt will complete bathing with min assist at sit > stand level OT Short Term Goal 2 (Week 2): Pt will complete LB dressing with mod assist at sit > stand level with AE as needed OT Short Term Goal 3 (Week 2): Pt will complete toilet transfer with min assist OT Short Term Goal 4 (Week 2): Pt will complete tub/shower transfer with min assist with use of tub transfer bench OT Short Term Goal 5 (Week 2): Pt will utilize LUE as stabilizer during self-care tasks with <10% cues.  Skilled Therapeutic Interventions/Progress Updates:    Engaged in ADL retraining with focus on functional transfers, education of AE and techniques to increase independence with dressing, and family education with daughter [name].  Ambulated to bathroom with quad cane to complete bathing at sit > stand in room shower.  Pt min assist ambulation and transfers this session.  Educated daughter on use of reacher and long handled shoe horn to increase participation and independence with LB dressing, pt may also benefit from long handled sponge for bathing.  Pt continues to demonstrate good carryover of body positioning with sit > stand and in standing with <10% cues.  Engaged in [place] transfer in ADL bathroom, pt unable to lift LLE over tub ledge despite attempts to lean backwards or with use of leg lifter, continuing to require assist to lift LLE.  Educated daughter on need for supervision with bathing and dressing tasks and most likely min assist with tub/shower transfer even with AE.  Therapy Documentation Precautions:  Precautions Precautions: Fall Precaution Comments: Lt hemiparesis Restrictions Weight Bearing Restrictions: No General:   Vital Signs: Therapy Vitals BP: 126/72  mmHg Pain:  Pt with no c/o pain  See FIM for current functional status  Therapy/Group: Individual Therapy  Simonne Come 09/09/2013, 11:29 AM

## 2013-09-09 NOTE — Progress Notes (Addendum)
Nursing Note: Pt off the unit at this time. Pt mentioned to NT that she was going off the unit to use the computer on the other side[facility]] The NT asked  The pt to wait till he checked with her  nurse but the pt did not and just left anyway.Called [address] and asked the secretary if the pt was over there and she stated yes. I asked her to ask the pt if she would ,please  come back to the unit.Marliss Coots ,Nurse Secretary accompanied the  pt back .Upon return,I explained to the pt that she must have an order for a grounds pass to leave the unit. I explained that the staff on the other side may  not know her and may  not know how she transfers and that we did not want her to fall again.I explained to pt that it was for her safety and the safety of others as well. Pt nodded I then  Informed  her that I would leave a note to ask the doctor for a ground's pass. I explained that we have to have a doctor's order for everything r/t to her care.Pt nodded and went back to her room. I also explained that as her nurse I need to know where she is.Pt rolled her self back to her room.wbb

## 2013-09-09 NOTE — Progress Notes (Signed)
Occupational Therapy Session Note  Patient Details  Name: AMBREEN TUFTE MRN: [id number] Date of Birth: [birth year]  Today's Date: 09/09/2013 Time: 1300-1330 Time Calculation (min): 30 min  Skilled Therapeutic Interventions/Progress Updates:    Pt worked on Brewing technologist for the LUE and trunk during session.  Began with LUE in weightbearing on the mat with pt shifting weight on the UE while attempting to maintain elbow extension.  Pt needing max facilitation to maintain contact with the mat surface and to maintain elbow extension while reaching across her body with the RUE.  Progressed to using tilted stool to work on active left elbow extension and shoulder flexion.  Therapist was able to grade the position of the stool in order for pt to actively perform small ranges of shoulder flexion and extension with max instructional cueing for trunk position and for avoiding trunk compensation techniques.   Therapy Documentation Precautions:  Precautions Precautions: Fall Precaution Comments: Lt hemiparesis Restrictions Weight Bearing Restrictions: No  Pain: Pain Assessment Pain Assessment: No/denies pain ADL: See FIM for current functional status  Therapy/Group: Individual Therapy  Ash Mcelwain OTR/L 09/09/2013, 4:08 PM

## 2013-09-09 NOTE — Progress Notes (Signed)
Speech Language Pathology Daily Session Note  Patient Details  Name: [name] MRN: [id number] Date of Birth: 18-Sep-1950  Today's Date: 09/09/2013 Time: 6213-0865 Time Calculation (min): 30 min  Short Term Goals: Week 2: SLP Short Term Goal 1 (Week 2): Pt will utilize speech intelligibility strategies (slow rate, increased vocal intensity) at the sentence level with supervision cues.  SLP Short Term Goal 2 (Week 2): Pt will utilize diaphragmatic breathing at the phrase level with supervision cues.  SLP Short Term Goal 3 (Week 2): Pt will recall current medications with Mod I.  Skilled Therapeutic Interventions:  Pt was seen for skilled speech therapy targeting cognitive-linguistic goals.  Upon arrival, pt was seated upright in wheelchair, pleasantly interactive and agreeable to participate in ST.  SLP facilitated session with structured financial management task with pt requiring overall supervision level assist for 100% accuracy.  Pt was also noted to recall 100% of her compensatory strategies for speech intelligibility with initial question cues and use strategies in a functional conversations for ~90% speech intelligibility with modified independence.  Pt also recalled 100% of details from therapy schedule, including names of therapists and approximate times of therapy, and at least three details from each therapy session with supervision question cues.  SLP will plan to see pt tomorrow to complete education prior to discharge.  Continue per current plan of care.    FIM:  Comprehension Comprehension Mode: Auditory Comprehension: 5-Understands complex 90% of the time/Cues < 10% of the time Expression Expression Mode: Verbal Expression: 5-Expresses complex 90% of the time/cues < 10% of the time Social Interaction Social Interaction: 5-Interacts appropriately 90% of the time - Needs monitoring or encouragement for participation or interaction. Problem Solving Problem Solving: 5-Solves  basic 90% of the time/requires cueing < 10% of the time Memory Memory: 5-Recognizes or recalls 90% of the time/requires cueing < 10% of the time FIM - Eating Eating Activity: 5: Set-up assist for open containers  Pain Pain Assessment Pain Assessment: No/denies pain  Therapy/Group: Individual Therapy  Windell Moulding, M.A. CCC-SLP  Thia Olesen, Selinda Orion 09/09/2013, 5:38 PM

## 2013-09-10 ENCOUNTER — Inpatient Hospital Stay (HOSPITAL_COMMUNITY): Payer: Medicare Other | Admitting: Physical Therapy

## 2013-09-10 ENCOUNTER — Inpatient Hospital Stay (HOSPITAL_COMMUNITY): Payer: Medicare Other | Admitting: Speech Pathology

## 2013-09-10 ENCOUNTER — Inpatient Hospital Stay (HOSPITAL_COMMUNITY): Payer: Medicare Other | Attending: Physical Medicine & Rehabilitation | Admitting: Occupational Therapy

## 2013-09-10 ENCOUNTER — Inpatient Hospital Stay (HOSPITAL_COMMUNITY): Payer: Medicare Other | Admitting: Occupational Therapy

## 2013-09-10 ENCOUNTER — Inpatient Hospital Stay (HOSPITAL_COMMUNITY): Payer: Medicare Other | Admitting: *Deleted

## 2013-09-10 DIAGNOSIS — Z5189 Encounter for other specified aftercare: Secondary | ICD-10-CM

## 2013-09-10 DIAGNOSIS — I6330 Cerebral infarction due to thrombosis of unspecified cerebral artery: Secondary | ICD-10-CM

## 2013-09-10 DIAGNOSIS — I10 Essential (primary) hypertension: Secondary | ICD-10-CM

## 2013-09-10 NOTE — Progress Notes (Signed)
Physical Therapy Session Note  Patient Details  Name: MAO LOCKNER MRN: [id number] Date of Birth: 1950/10/26  Today's Date: 09/10/2013 Time: 3086-5784 Time Calculation (min): 60 min  Short Term Goals: Week 2:  PT Short Term Goal 1 (Week 2): Pt will consistently perform supine>sit with supervision and 25% cueing for technique. PT Short Term Goal 2 (Week 2): Pt will consistently perform sit>supine with supervision and 25% cueing for technique. PT Short Term Goal 3 (Week 2): Pt will perform sit>stand with supervision and 25% cueing for setup. PT Short Term Goal 4 (Week 2): Pt will perform dynamic standing balance x1 minute without UE support with min A. PT Short Term Goal 5 (Week 2): Pt will perform gait x150' in controlled environment with LRAD and min guard.  Skilled Therapeutic Interventions/Progress Updates:    Pt received seated in w/c; agreeable to therapy. Session focused on functional transfers, gait training, and increasing proximal stability. Performed blocked practice of sit<>stand without UE support to promote symmetrical LE weightbearing, decrease compensatory strategies. Verbal/demonstration cueing focused on setup, foot placement with effective return demonstration. Pt able to perform final 3 transfers with supervision and no LOB.  Pt performed gait x78' in controled environment with LBQC, L AFO (WalkOn) and min guard. Pt reports increased comfort with Thomas Hoff (as compared with Reaction) and demonstrates sufficient L ankle dorsiflexion during LLE advancement without knee flexion during LLE stance. However, question whether medial joint is increasing excessive L forefoot supination. Will consider WalkOn with supination correction strap.  Performed gait x15' in controlled environment with min A, R HHA. Noted decreased proximal stability during gait trial. See below for detailed description of NMR performed to increase proximal stability. Session ended in pt room, where pt was  left seated in w/c with all needs within reach.  Therapy Documentation Precautions:  Precautions Precautions: Fall Precaution Comments: Lt hemiparesis Restrictions Weight Bearing Restrictions: No Vital Signs: Therapy Vitals Temp: 97.8 F (36.6 C) Temp src: Oral Pulse Rate: 77 Resp: 18 BP: 128/72 mmHg Patient Position (if appropriate): Sitting Oxygen Therapy SpO2: 98 % O2 Device: None (Room air) Pain: Pain Assessment Pain Assessment: No/denies pain Mobility:   Locomotion : Ambulation Ambulation/Gait Assistance: 4: Min guard Wheelchair Mobility Distance: 150  NMR: Neuromuscular Facilitation: Left;Upper Extremity;Lower Extremity;Activity to increase coordination;Activity to increase motor control;Activity to increase timing and sequencing;Activity to increase grading;Activity to increase lateral weight shifting;Activity to increase sustained activation;Other (comment) (Activity to increase proximal stability) Quadruped and tall kneeling performed to increase proprioception, facilitate co-contraction, and to increase proximal stability. Pt required +2A to get into quadruped and tall kneeling. Mod A required for manual stabilization of LUE during quadruped, which was performed 2 x 30 seconds (to pt fatigue). Transitioned to tall kneeling position, in which pt performed RUE reaching laterally (with min A) and across midline (with mod A) with manual facilitation of bilat hip extension and verbal cueing for upright posture throughout.  See FIM for current functional status  Therapy/Group: Individual Therapy  Hobble, Malva Cogan 09/10/2013, 7:03 PM

## 2013-09-10 NOTE — Progress Notes (Signed)
Recreational Therapy Session Note  Patient Details  Name: [name] MRN: [id number] Date of Birth: [birth year] Today's Date: 09/10/2013  Pain: no c/o Skilled Therapeutic Interventions/Progress Updates: Session focused on activity tolerance, sit-stands, standing balance/tolerance, WBing through LUE, speech intelligeability during horticultural therapy activity.  Pt performed sit-stand with supervision & stood to repot plants WBing through LUE with supervision, min cues.   No cuing required for speech intelligeability at conversational level.  Pt participated in animal assisted activity w/c level with supervision.    Therapy/Group: Individual Therapy [name] 09/10/2013, 4:56 PM

## 2013-09-10 NOTE — Progress Notes (Signed)
Speech Language Pathology Discharge Summary  Patient Details  Name: [name] MRN: [id number] Date of Birth: 06/01/1950  Today's Date: 09/10/2013 Time: 1133-1203 Time Calculation (min): 30 min  Skilled Therapeutic Interventions:  Pt was seen for skilled speech therapy targeting cognitive-linguistic goals and education.  Pt was seated upright in wheelchair upon arrival and was agreeable to participate in speech therapy.  SLP facilitated session with functional conversations related to medication management targeting recall of new information with pt able to recall 100% of scheduled medications with supervision question cues. SLP reviewed and reinforced skilled education related to compensatory strategies for memory and dysarthria with pt verbalizing and demonstrating understanding of recommendations.  Pt was noted to utilize dysarthria strategies during functional conversations for 90% intelligibility.  Pt was provided with handouts of compensatory strategies for memory to facilitate carryover in the home environment.      Patient has met 3 of 3 long term goals.  Patient to discharge at overall Modified Independent level.  Reasons goals not met: n/a   Clinical Impression/Discharge Summary:  Pt made functional gains while inpatient and is discharging having met 3 out of 3 long term goals.  Pt is overall modified independent for cognitive-linguistic tasks and self manages her use of dysarthria strategies to improve speech intelligibility to 80-90% at the conversational level.  Pt reports a return to cognitive baseline and patient education is complete, therefore, no further speech services are indicated at this time.  Pt will discharge home with supervision/assist from family for safety as well as medication and financial management.     Care Partner:  Caregiver Able to Provide Assistance: Yes  Type of Caregiver Assistance: Physical;Cognitive  Recommendation:  None     Equipment: none  recommended by SLP    Reasons for discharge: Treatment goals met   Patient/Family Agrees with Progress Made and Goals Achieved: Yes   See FIM for current functional status  Nicole Page, M.A. CCC-SLP  Page, Nicole L 09/10/2013, 4:31 PM    

## 2013-09-10 NOTE — Progress Notes (Signed)
Social Work Patient ID: [name], female   DOB: 11/12/1950, 62 y.o.   MRN: [id number] Met with pt to inform of team conference progression toward goals and progress.  She is pleased with how well she is doing. She feels has regained her confidence form falling late last week.  She feels the antidepressant is helping her coping.  Discussed OP therapies  Recommendation she will ask family about transportation. 

## 2013-09-10 NOTE — Progress Notes (Signed)
Subjective/Complaints: 63 y.o. right handed female with history of hypertension. Patient lives alone and was independent prior to admission. Admitted 08/23/2013 with left-sided weakness of acute onset after returning home from work as well as slurred speech. MRI showed acute infarction within the right posterior limb internal capsule/corona radiata. MRA of the head with no major occlusion or stenosis. Echocardiogram with ejection fraction of 96% grade 1 diastolic dysfunction. Carotid Dopplers with no ICA stenosis. Patient did not receive TPA. Neurology consulted placed on aspirin for CVA prophylaxis as well as enrollment in the Point trial study.    Seems to be in better spirits today. Slept fairly well. Denies acute pain. Review of Systems -otherwise neg  Objective: Vital Signs: Blood pressure 105/65, pulse 74, temperature 98.2 F (36.8 C), temperature source Oral, resp. rate 18, height 5\' 3"  (1.6 m), weight 110.6 kg (243 lb 13.3 oz), SpO2 98.00%. No results found. No results found for this or any previous visit (from the past 72 hour(s)).   HEENT: normal Cardio: RRR Resp: CTA B/L GI: BS positive Extremity:  No Edema Skin:   Intact Neuro: Flat, Abnormal Motor 0/5 LUE, 1+ L HF, 0/5 L foot/ankle , 3- Knee ext with Hip ext synergy  and Dysarthric Musc/Skel:  Normal, left shoulder no increased tone, no pain with ext rotation, minimal pain with abduction. No swelling bruising on face or left shoulder Gen NAD   Assessment/Plan: 1. Functional deficits secondary to R PLIC infarct which require 3+ hours per day of interdisciplinary therapy in a comprehensive inpatient rehab setting. Physiatrist is providing close team supervision and 24 hour management of active medical problems listed below. Physiatrist and rehab team continue to assess barriers to discharge/monitor patient progress toward functional and medical goals.  FIM: FIM - Bathing Bathing Steps Patient Completed: Chest;Left  Arm;Abdomen;Front perineal area;Right upper leg;Left upper leg;Buttocks Bathing: 3: Mod-Patient completes 5-7 20f 10 parts or 50-74%  FIM - Upper Body Dressing/Undressing Upper body dressing/undressing steps patient completed: Thread/unthread right sleeve of pullover shirt/dresss;Thread/unthread left sleeve of pullover shirt/dress;Put head through opening of pull over shirt/dress;Pull shirt over trunk Upper body dressing/undressing: 5: Set-up assist to: Obtain clothing/put away FIM - Lower Body Dressing/Undressing Lower body dressing/undressing steps patient completed: Thread/unthread right underwear leg;Thread/unthread left underwear leg;Pull underwear up/down;Thread/unthread right pants leg;Thread/unthread left pants leg;Pull pants up/down;Don/Doff right sock;Don/Doff right shoe Lower body dressing/undressing: 4: Min-Patient completed 75 plus % of tasks  FIM - Toileting Toileting steps completed by patient: Adjust clothing prior to toileting;Performs perineal hygiene Toileting Assistive Devices: Grab bar or rail for support Toileting: 3: Mod-Patient completed 2 of 3 steps  FIM - Radio producer Devices: Grab bars;Cane Toilet Transfers: 4-From toilet/BSC: Min A (steadying Pt. > 75%)  FIM - Control and instrumentation engineer Devices: Cane;Arm rests [facility]) Bed/Chair Transfer: 4: Chair or W/C > Bed: Min A (steadying Pt. > 75%);4: Bed > Chair or W/C: Min A (steadying Pt. > 75%);5: Supine > Sit: Supervision (verbal cues/safety issues);4: Sit > Supine: Min A (steadying pt. > 75%/lift 1 leg)  FIM - Locomotion: Wheelchair Distance: 180 Locomotion: Wheelchair: 5: Travels 150 ft or more: maneuvers on rugs and over door sills with supervision, cueing or coaxing FIM - Locomotion: Ambulation Locomotion: Ambulation Assistive Devices: Nurse, adult Ambulation/Gait Assistance: 4: Min assist;4: Min guard Locomotion: Ambulation: 2: Travels 50 - 149 ft with minimal  assistance (Pt.>75%)  Comprehension Comprehension Mode: Auditory Comprehension: 5-Understands complex 90% of the time/Cues < 10% of the time  Expression Expression Mode:  Verbal Expression: 5-Expresses basic 90% of the time/requires cueing < 10% of the time.  Social Interaction Social Interaction Mode: Asleep Social Interaction: 5-Interacts appropriately 90% of the time - Needs monitoring or encouragement for participation or interaction.  Problem Solving Problem Solving Mode: Asleep Problem Solving: 5-Solves basic 90% of the time/requires cueing < 10% of the time  Memory Memory Mode: Asleep Memory: 5-Recognizes or recalls 90% of the time/requires cueing < 10% of the time  Medical Problem List and Plan:  1. Functional deficits secondary to right PLIC infarcts secondary to small vessel disease  2. DVT Prophylaxis/Anticoagulation: SCDs. Monitor for any signs of DVT  3. Pain Management: Tylenol as needed, Kpad, analgesic topical ordered  -continue elevation of LUE  -needs to use kpad to neck shoulder 4. Hypertension. Lopressor 12.5 mg twice a day. Monitor with increased mobility  5. Neuropsych: This patient is capable of making decisions on her own behalf.   -added low dose celexa  6. Hyperlipidemia. Lipitor   LOS (Days) 15 A FACE TO FACE EVALUATION WAS PERFORMED  [name] T 09/10/2013, 9:32 AM

## 2013-09-10 NOTE — Patient Care Conference (Signed)
Inpatient RehabilitationTeam Conference and Plan of Care Update Date: 09/10/2013   Time: 10;15 AM    Patient Name: [name]      Medical Record Number: [id number]  Date of Birth: 1950-08-09 Sex: Female         Room/Bed: 4M09C/4M09C-01 Payor Info: Payor: MEDICARE / Plan: MEDICARE PART A AND B / Product Type: *No Product type* /    Admitting Diagnosis: R CVA  Admit Date/Time:  08/26/2013  5:49 PM Admission Comments: No comment available   Primary Diagnosis:  <principal problem not specified> Principal Problem: <principal problem not specified>  Patient Active Problem List   Diagnosis Date Noted  . Chronic diastolic heart failure 61/60/7371  . Dyslipidemia 08/26/2013  . Borderline diabetes 08/26/2013  . CVA (cerebral infarction) 08/26/2013  . Acute CVA (cerebrovascular accident) 08/23/2013  . Pre-syncope 08/17/2012  . Hypokalemia 08/17/2012  . ARTHRITIS 03/28/2010  . DE QUERVAIN'S TENOSYNOVITIS 03/28/2010  . Normocytic anemia 07/26/2009  . REACTIVE AIRWAY DISEASE 03/09/2009  . DENTAL CARIES 03/09/2009  . CANDIDIASIS OF VULVA AND VAGINA 06/04/2008  . SKIN TAG 06/04/2008  . ARTHRITIS, KNEES, BILATERAL 05/22/2008  . HYPERTENSION, BENIGN ESSENTIAL 05/04/2008  . KNEE PAIN, BILATERAL 05/04/2008  . COUGH 05/04/2008  . MENISCUS TEAR, RIGHT 12/07/2005    Expected Discharge Date: Expected Discharge Date: 09/18/13  Team Members Present: Physician leading conference: Dr. Alger Simons Social Worker Present: Ovidio Kin, LCSW Nurse Present: Elliot Cousin, RN PT Present: Billie Ruddy, PT OT Present: Benay Pillow, Jules Schick, OT SLP Present: Windell Moulding, SLP PPS Coordinator present : Ileana Ladd, Lelan Pons, RN, CRRN     Current Status/Progress Goal Weekly Team Focus  Medical   pain issues better. ? depression, open to treatment  improve functional mobility  pain control, mood mgt, safety awareness   Bowel/Bladder   Continent of bowel and bladder. LBM 09/09/13  Pt  to remain continent of bowel and bladder  Monitor   Swallow/Nutrition/ Hydration     [facility]        ADL's   supervision grooming and UB dressing, mod assist bathing and LB dressing, min assist transfers with quad cane and supervision stand pivot from w/c  supervision overall, min assist LB dressing and tub/shower transfer  transfers, balance, LUE NMR, family education, d/c planning   Mobility   Min A overall  Supervision overall  bed mobility, L NMR, standing balance, gait/stairs, continue family training   Communication   Mod I for use of dysarthria strategies   Mod I   complete education and discharge from ST services    Safety/Cognition/ Behavioral Observations  Mod I   Mod I   complete education and discharge from ST services    Pain   No c/o pain  <3  Monitor for nonverbal cues of pain   Skin   Skin tear to L thigh, OTA, healing appropriately  no additional skin breakdown  Routine turn q 2hrs      *See Care Plan and progress notes for long and short-term goals.  Barriers to Discharge: substantial left side weakness    Possible Resolutions to Barriers:  ?orthotics, NMR, adaptive techniques    Discharge Planning/Teaching Needs:  Pt making good progress. return home with assist of family members      Team Discussion:  Making good progress in therapies, started med for depression.  Compensatory strategies working for [employer], will d/c soon  Revisions to Treatment Plan:  None   Continued Need for Acute Rehabilitation Level of Care: The  patient requires daily medical management by a physician with specialized training in physical medicine and rehabilitation for the following conditions: Daily direction of a multidisciplinary physical rehabilitation program to ensure safe treatment while eliciting the highest outcome that is of practical value to the patient.: Yes Daily medical management of patient stability for increased activity during participation in an intensive rehabilitation  regime.: Yes Daily analysis of laboratory values and/or radiology reports with any subsequent need for medication adjustment of medical intervention for : Neurological problems;Other  Kateland Leisinger, Gardiner Rhyme 09/10/2013, 11:54 AM

## 2013-09-10 NOTE — Progress Notes (Signed)
Occupational Therapy Weekly Progress Note  Patient Details  Name: [name] MRN: [id number] Date of Birth: 1950-04-08  Beginning of progress report period: September 03, 2013 End of progress report period: September 10, 2013  Today's Date: 09/10/2013 Time: 0915-1000 and 8341-9622 Time Calculation (min): 45 min and 45 min  Patient has met 5 of 5 short term goals.  Pt is making great progress towards goals. She is currently supervision- min assist with stand pivot and squat pivot transfers. Pt has progressed to min-mod assist with LB dressing secondary to hemiplegia and body habitus. Pt demonstrating carryover with use of AE for LB bathing and dressing. Pt's LUE continues to be greatest impairment, however slight shoulder flexion has been elicited post weight bearing during treatment sessions. Pt is extremely motivated and is making great progress  Patient continues to demonstrate the following deficits: Lt hemiplegia, impaired balance, decreased dynamic standing and therefore will continue to benefit from skilled OT intervention to enhance overall performance with BADL and Reduce care partner burden.  Patient progressing toward long term goals..  Continue plan of care.  OT Short Term Goals Week 2:  OT Short Term Goal 1 (Week 2): Pt will complete bathing with min assist at sit > stand level OT Short Term Goal 1 - Progress (Week 2): Met OT Short Term Goal 2 (Week 2): Pt will complete LB dressing with mod assist at sit > stand level with AE as needed OT Short Term Goal 2 - Progress (Week 2): Met OT Short Term Goal 3 (Week 2): Pt will complete toilet transfer with min assist OT Short Term Goal 3 - Progress (Week 2): Met OT Short Term Goal 4 (Week 2): Pt will complete tub/shower transfer with min assist with use of tub transfer bench OT Short Term Goal 4 - Progress (Week 2): Met OT Short Term Goal 5 (Week 2): Pt will utilize LUE as stabilizer during self-care tasks with <10% cues. OT Short Term Goal 5  - Progress (Week 2): Met Week 3:  OT Short Term Goal 1 (Week 3): STG = LTGs due to remaining LOS  Skilled Therapeutic Interventions/Progress Updates:  Balance/vestibular training;Discharge planning;Disease Lawyer;Functional mobility training;Neuromuscular re-education;Pain management;Patient/family education;Psychosocial support;Self Care/advanced ADL retraining;Therapeutic Activities;Therapeutic Exercise;UE/LE Strength taining/ROM;UE/LE Coordination activities   1) Engaged in ADL retraining with focus on sit > stand, dynamic standing balance, and education on AE to increase independence with LB self-care tasks. Pt in bed upon arrival, performed stand pivot transfer to w/c SBA.  Completed bathing and dressing at sit > stand level at sink with pt demonstrating recall and carryover of LLE and LUE placement during transitional movements and standing.  Issued pt long handled sponge to increase independence with bathing, educated on use to also wash RUE.  Utilized reacher and long handled shoe horn with LB dressing with pt still having difficulty with donning Lt sock and shoe due to decreased dorsiflexion and body habitus with bending forward.    2) NMR in sitting with focus on trunk and LUE.  In sitting engaged in WB through LUE with therapist providing support to ensure proper placement and elbow extension to facilitate increased WB. Completed reaching tasks to facilitate shoulder flexion/extension, noted slight flexion/extension. Noted slight thumb abduction in functional position on tray table during closed chain reaching.  Educated pt on towel glides to further address shoulder flexion/extension.  Therapy Documentation Precautions:  Precautions Precautions: Fall Precaution Comments: Lt hemiparesis Restrictions Weight Bearing Restrictions: No General:   Vital Signs: Therapy Vitals  Pulse Rate: 74 BP: 105/65 mmHg Patient Position (if appropriate):  Lying Pain: Pain Assessment Pain Assessment: No/denies pain Pain Score: 0-No pain  See FIM for current functional status  Therapy/Group: Individual Therapy  Taiyana Kissler, [place] 09/10/2013, 10:14 AM

## 2013-09-10 NOTE — Progress Notes (Signed)
Social Work [name], LCSW Social Worker Signed  Patient Care Conference Service date: 09/10/2013 11:54 AM  Inpatient RehabilitationTeam Conference and Plan of Care Update Date: 09/10/2013   Time: 10;15 AM     Patient Name: [name]       Medical Record Number: [id number]   Date of Birth: August 11, 1950 Sex: Female         Room/Bed: 4M09C/4M09C-01 Payor Info: Payor: MEDICARE / Plan: MEDICARE PART A AND B / Product Type: *No Product type* /   Admitting Diagnosis: R CVA   Admit Date/Time:  08/26/2013  5:49 PM Admission Comments: No comment available   Primary Diagnosis:  <principal problem not specified> Principal Problem: <principal problem not specified>    Patient Active Problem List     Diagnosis  Date Noted   .  Chronic diastolic heart failure  86/75/4492   .  Dyslipidemia  08/26/2013   .  Borderline diabetes  08/26/2013   .  CVA (cerebral infarction)  08/26/2013   .  Acute CVA (cerebrovascular accident)  08/23/2013   .  Pre-syncope  08/17/2012   .  Hypokalemia  08/17/2012   .  ARTHRITIS  03/28/2010   .  DE QUERVAIN'S TENOSYNOVITIS  03/28/2010   .  Normocytic anemia  07/26/2009   .  REACTIVE AIRWAY DISEASE  03/09/2009   .  DENTAL CARIES  03/09/2009   .  CANDIDIASIS OF VULVA AND VAGINA  06/04/2008   .  SKIN TAG  06/04/2008   .  ARTHRITIS, KNEES, BILATERAL  05/22/2008   .  HYPERTENSION, BENIGN ESSENTIAL  05/04/2008   .  KNEE PAIN, BILATERAL  05/04/2008   .  COUGH  05/04/2008   .  MENISCUS TEAR, RIGHT  12/07/2005     Expected Discharge Date: Expected Discharge Date: 09/18/13  Team Members Present: Physician leading conference: Dr. Alger Simons Social Worker Present: Ovidio Kin, LCSW Nurse Present: Elliot Cousin, RN PT Present: Billie Ruddy, PT OT Present: Benay Pillow, Jules Schick, OT SLP Present: Windell Moulding, SLP PPS Coordinator present : Ileana Ladd, Lelan Pons, RN, CRRN        Current Status/Progress  Goal  Weekly Team Focus   Medical    pain issues better. ? depression, open to treatment  improve functional mobility  pain control, mood mgt, safety awareness   Bowel/Bladder     Continent of bowel and bladder. LBM 09/09/13  Pt to remain continent of bowel and bladder  Monitor   Swallow/Nutrition/ Hydration     [facility]       ADL's     supervision grooming and UB dressing, mod assist bathing and LB dressing, min assist transfers with quad cane and supervision stand pivot from w/c  supervision overall, min assist LB dressing and tub/shower transfer  transfers, balance, LUE NMR, family education, d/c planning   Mobility     Min A overall  Supervision overall  bed mobility, L NMR, standing balance, gait/stairs, continue family training   Communication     Mod I for use of dysarthria strategies   Mod I   complete education and discharge from ST services    Safety/Cognition/ Behavioral Observations    Mod I   Mod I   complete education and discharge from ST services    Pain     No c/o pain  <3  Monitor for nonverbal cues of pain   Skin     Skin tear to L thigh, OTA, healing appropriately  no additional skin  breakdown  Routine turn q 2hrs     *See Care Plan and progress notes for long and short-term goals.    Barriers to Discharge:  substantial left side weakness      Possible Resolutions to Barriers:    ?orthotics, NMR, adaptive techniques      Discharge Planning/Teaching Needs:    Pt making good progress. return home with assist of family members      Team Discussion:    Making good progress in therapies, started med for depression.  Compensatory strategies working for [employer], will d/c soon   Revisions to Treatment Plan:    None    Continued Need for Acute Rehabilitation Level of Care: The patient requires daily medical management by a physician with specialized training in physical medicine and rehabilitation for the following conditions: Daily direction of a multidisciplinary physical rehabilitation program to ensure safe  treatment while eliciting the highest outcome that is of practical value to the patient.: Yes Daily medical management of patient stability for increased activity during participation in an intensive rehabilitation regime.: Yes Daily analysis of laboratory values and/or radiology reports with any subsequent need for medication adjustment of medical intervention for : Neurological problems;Other  [name] 09/10/2013, 11:54 AM         [name], LCSW Social Worker Signed  Patient Care Conference Service date: 09/03/2013 2:30 PM  Inpatient RehabilitationTeam Conference and Plan of Care Update Date: 09/03/2013   Time: 11;10 AM     Patient Name: [name]       Medical Record Number: [id number]   Date of Birth: 1950-03-26 Sex: Female         Room/Bed: 4M09C/4M09C-01 Payor Info: Payor: MEDICARE / Plan: MEDICARE PART A AND B / Product Type: *No Product type* /   Admitting Diagnosis: R CVA   Admit Date/Time:  08/26/2013  5:49 PM Admission Comments: No comment available   Primary Diagnosis:  <principal problem not specified> Principal Problem: <principal problem not specified>    Patient Active Problem List     Diagnosis  Date Noted   .  Chronic diastolic heart failure  78/29/5621   .  Dyslipidemia  08/26/2013   .  Borderline diabetes  08/26/2013   .  CVA (cerebral infarction)  08/26/2013   .  Acute CVA (cerebrovascular accident)  08/23/2013   .  Pre-syncope  08/17/2012   .  Hypokalemia  08/17/2012   .  ARTHRITIS  03/28/2010   .  DE QUERVAIN'S TENOSYNOVITIS  03/28/2010   .  Normocytic anemia  07/26/2009   .  REACTIVE AIRWAY DISEASE  03/09/2009   .  DENTAL CARIES  03/09/2009   .  CANDIDIASIS OF VULVA AND VAGINA  06/04/2008   .  SKIN TAG  06/04/2008   .  ARTHRITIS, KNEES, BILATERAL  05/22/2008   .  HYPERTENSION, BENIGN ESSENTIAL  05/04/2008   .  KNEE PAIN, BILATERAL  05/04/2008   .  COUGH  05/04/2008   .  MENISCUS TEAR, RIGHT  12/07/2005     Expected Discharge  Date: Expected Discharge Date: 09/18/2013  Team Members Present: Physician leading conference: Dr. Alysia Penna Social Worker Present: Ovidio Kin, LCSW Nurse Present: Dorien Chihuahua, RN PT Present: Raylene Everts, PT;Blair Hobble, PT OT Present: Willeen Cass, OT SLP Present: Windell Moulding, SLP PPS Coordinator present : Ileana Ladd, Lelan Pons, RN, [facility]        Current Status/Progress  Goal  Weekly Team Focus   Medical  No movement Left arm, Left LE improvement  return to home with family assist  Upgrading goal to Sup for mobility   Bowel/Bladder     cont of bowel and bladder, occasional urgency; LBM 7/28  cont of bowel and bladder with min assist  monitor, offer toileting    Swallow/Nutrition/ Hydration     WFL       ADL's     mod assist stand pivot transfer, supervision grooming and UB dressing, max-total assist LB dressing  supervision seated self-care, min assist LB and toileting  transfers, balance, LUE NMR   Mobility     Mod A stand pivot transfers; Min A gait and stairs  Upgraded goals to supervision overall  transfers, bed mobility, standing, gait/stairs, L NMR, standing balance, family training, pursue L AFO   Communication     min assist-supervision for speech intelligibility   Mod I  carryover of strategies    Safety/Cognition/ Behavioral Observations    supervision for higher level problem solving and memory   mod I  carryover of compensatory   Pain     n/a; prn tylenol for mild pain  pain managed less than 3/10  offer prn pain med prior to therapy sessions   Skin     CDI  no infection or skin breakdown  monitor     *See Care Plan and progress notes for long and short-term goals.    Barriers to Discharge:  poor arm recovery      Possible Resolutions to Barriers:    Cont rehab      Discharge Planning/Teaching Needs:    Pt would like to go home 8/8 due to family reunion she wants to go to.  Daughter here and involved      Team Discussion:    Pt is  making good progress-upgraded goals.  Arm is slowly making progress-pt plans to stay here until 8/13 to get the most benefit from her therapies.   Revisions to Treatment Plan:    Upgraded goals to supervision level.  SP 3 x week    Continued Need for Acute Rehabilitation Level of Care: The patient requires daily medical management by a physician with specialized training in physical medicine and rehabilitation for the following conditions: Daily direction of a multidisciplinary physical rehabilitation program to ensure safe treatment while eliciting the highest outcome that is of practical value to the patient.: Yes Daily medical management of patient stability for increased activity during participation in an intensive rehabilitation regime.: Yes Daily analysis of laboratory values and/or radiology reports with any subsequent need for medication adjustment of medical intervention for : Neurological problems  Yaslene Lindamood, Gardiner Rhyme 09/03/2013, 2:30 PM         [name], LCSW Social Worker Signed  Patient Care Conference Service date: 08/27/2013 3:22 PM  Inpatient RehabilitationTeam Conference and Plan of Care Update Date: 08/27/2013   Time: 11;20 AM     Patient Name: [name]       Medical Record Number: [id number]   Date of Birth: 05-02-1950 Sex: Female         Room/Bed: 4M09C/4M09C-01 Payor Info: Payor: MEDICARE / Plan: MEDICARE PART A AND B / Product Type: *No Product type* /   Admitting Diagnosis: R CVA   Admit Date/Time:  08/26/2013  5:49 PM Admission Comments: No comment available   Primary Diagnosis:  <principal problem not specified> Principal Problem: <principal problem not specified>    Patient Active Problem List     Diagnosis  Date Noted   .  Chronic diastolic heart failure  16/11/9602   .  Dyslipidemia  08/26/2013   .  Borderline diabetes  08/26/2013   .  CVA (cerebral infarction)  08/26/2013   .  Acute CVA (cerebrovascular accident)  08/23/2013   .  Pre-syncope   08/17/2012   .  Hypokalemia  08/17/2012   .  ARTHRITIS  03/28/2010   .  DE QUERVAIN'S TENOSYNOVITIS  03/28/2010   .  Normocytic anemia  07/26/2009   .  REACTIVE AIRWAY DISEASE  03/09/2009   .  DENTAL CARIES  03/09/2009   .  CANDIDIASIS OF VULVA AND VAGINA  06/04/2008   .  SKIN TAG  06/04/2008   .  ARTHRITIS, KNEES, BILATERAL  05/22/2008   .  HYPERTENSION, BENIGN ESSENTIAL  05/04/2008   .  KNEE PAIN, BILATERAL  05/04/2008   .  COUGH  05/04/2008   .  MENISCUS TEAR, RIGHT  12/07/2005     Expected Discharge Date: Expected Discharge Date: 09/18/13  Team Members Present: Physician leading conference: Dr. Alysia Penna Social Worker Present: Ovidio Kin, LCSW Nurse Present: Elliot Cousin, RN PT Present: Billie Ruddy, PT;Caroline Lacinda Axon, PT OT Present: Simonne Come, OT SLP Present: Windell Moulding, SLP PPS Coordinator present : Ileana Ladd, Lelan Pons, RN, CRRN        Current Status/Progress  Goal  Weekly Team Focus   Medical     dysarthric, severe R HP  maximize functional recovery  Emphasis on emerging motor returen RLE   Bowel/Bladder     timed tolieting every 3 hours  cont B & B  timed tolieting   Swallow/Nutrition/ Hydration     wfl       ADL's     +2 transfers and LB self-care  supervision seated self-care, min assist LB and toileting  transfers, balance, LUE NMR   Mobility     +2A overall  Supervision transfers; Min A gait and stairs  basic transfers, bed mobility, standing, gait/stairs, L NMR, initiate patient/family education   Communication     min assist for speech intelligibility   mod I  educaiton and carryover of compensatory strategies    Safety/Cognition/ Behavioral Observations    min assist for higher level problem solving and memory   mod I   education and carryover of compensatory strategies    Pain     pain less than 3, monitor while here  no pain issues reported     Skin     monitor skin-no issues-none currently         *See Care Plan and  progress notes for long and short-term goals.    Barriers to Discharge:  Functional level low      Possible Resolutions to Barriers:    cont rehab      Discharge Planning/Teaching Needs:    Home with daughter's assisting-aware of the alternative options-confirm with daughter's    Team Discussion:    Severe weakness on left side-OT reports moved arm this am.  New eval Should do well here-MD   Revisions to Treatment Plan:    New eval    Continued Need for Acute Rehabilitation Level of Care: The patient requires daily medical management by a physician with specialized training in physical medicine and rehabilitation for the following conditions: Daily direction of a multidisciplinary physical rehabilitation program to ensure safe treatment while eliciting the highest outcome that is of practical value to the patient.: Yes Daily medical management of patient stability for  increased activity during participation in an intensive rehabilitation regime.: Yes Daily analysis of laboratory values and/or radiology reports with any subsequent need for medication adjustment of medical intervention for : Neurological problems;Other  [name] 08/29/2013, 3:26 PM          Patient ID: [name], female   DOB: 22-Oct-1950, 63 y.o.   MRN: [id number]

## 2013-09-11 ENCOUNTER — Inpatient Hospital Stay (HOSPITAL_COMMUNITY): Payer: Medicare Other | Admitting: Occupational Therapy

## 2013-09-11 ENCOUNTER — Inpatient Hospital Stay (HOSPITAL_COMMUNITY): Payer: Medicare Other | Admitting: Physical Therapy

## 2013-09-11 NOTE — Progress Notes (Signed)
Subjective/Complaints: 63 y.o. right handed Barber with history of hypertension. Patient lives alone and was independent prior to admission. Admitted 08/23/2013 with left-sided weakness of acute onset after returning home from work as well as slurred speech. MRI showed acute infarction within the right posterior limb internal capsule/corona radiata. MRA of the head with no major occlusion or stenosis. Echocardiogram with ejection fraction of 10% grade 1 diastolic dysfunction. Carotid Dopplers with no ICA stenosis. Patient did not receive TPA. Neurology consulted placed on aspirin for CVA prophylaxis as well as enrollment in the Point trial study.    No new issues. Slept well. Denies pain Review of Systems -otherwise neg  Objective: Vital Signs: Blood pressure 125/72, pulse 62, temperature 97.4 F (36.3 C), temperature source Oral, resp. rate 18, height 5\' 3"  (1.6 m), weight 110.6 kg (243 lb 13.3 oz), SpO2 97.00%. No results found. No results found for this or any previous visit (from the past 72 hour(s)).   HEENT: normal Cardio: RRR Resp: CTA B/L GI: BS positive Extremity:  No Edema Skin:   Intact Neuro: Flat, Abnormal Motor 0/5 LUE, 1+ L HF, 0/5 L foot/ankle , 3- Knee ext with Hip ext synergy  and Dysarthric Musc/Skel:  Normal, left shoulder no increased tone, no pain with ext rotation, minimal pain with abduction. No swelling bruising on face or left shoulder Gen NAD   Assessment/Plan: 1. Functional deficits secondary to R PLIC infarct which require 3+ hours per day of interdisciplinary therapy in a comprehensive inpatient rehab setting. Physiatrist is providing close team supervision and 24 hour management of active medical problems listed below. Physiatrist and rehab team continue to assess barriers to discharge/monitor patient progress toward functional and medical goals.  FIM: FIM - Bathing Bathing Steps Patient Completed: Chest;Left Arm;Abdomen;Front perineal area;Right upper  leg;Left upper leg;Buttocks Bathing: 3: Mod-Patient completes 5-7 57f 10 parts or 50-74%  FIM - Upper Body Dressing/Undressing Upper body dressing/undressing steps patient completed: Thread/unthread right sleeve of pullover shirt/dresss;Thread/unthread left sleeve of pullover shirt/dress;Put head through opening of pull over shirt/dress;Pull shirt over trunk Upper body dressing/undressing: 5: Set-up assist to: Obtain clothing/put away FIM - Lower Body Dressing/Undressing Lower body dressing/undressing steps patient completed: Thread/unthread right underwear leg;Thread/unthread left underwear leg;Pull underwear up/down;Thread/unthread right pants leg;Thread/unthread left pants leg;Pull pants up/down;Don/Doff right sock;Don/Doff right shoe Lower body dressing/undressing: 4: Min-Patient completed 75 plus % of tasks  FIM - Toileting Toileting steps completed by patient: Adjust clothing prior to toileting;Performs perineal hygiene;Adjust clothing after toileting Toileting Assistive Devices: Grab bar or rail for support Toileting: 4: Steadying assist  FIM - Radio producer Devices: Grab bars;Cane Toilet Transfers: 4-From toilet/BSC: Min A (steadying Pt. > 75%)  FIM - Control and instrumentation engineer Devices: Best boy: 4: Bed > Chair or W/C: Min A (steadying Pt. > 75%);4: Chair or W/C > Bed: Min A (steadying Pt. > 75%)  FIM - Locomotion: Wheelchair Distance: 150 Locomotion: Wheelchair: 5: Travels 150 ft or more: maneuvers on rugs and over door sills with supervision, cueing or coaxing FIM - Locomotion: Ambulation Locomotion: Ambulation Assistive Devices: Nurse, adult Ambulation/Gait Assistance: 4: Min guard Locomotion: Ambulation: 2: Travels 50 - 149 ft with minimal assistance (Pt.>75%)  Comprehension Comprehension Mode: Auditory Comprehension: 5-Understands complex 90% of the time/Cues < 10% of the time  Expression Expression Mode:  Verbal Expression: 5-Expresses basic needs/ideas: With extra time/assistive device  Social Interaction Social Interaction Mode: Asleep Social Interaction: 6-Interacts appropriately with others with medication or extra time (anti-anxiety, antidepressant).  Problem Solving Problem Solving Mode: Asleep Problem Solving: 6-Solves complex problems: With extra time  Memory Memory Mode: Asleep Memory: 6-More than reasonable amt of time  Medical Problem List and Plan:  1. Functional deficits secondary to right PLIC infarcts secondary to small vessel disease  2. DVT Prophylaxis/Anticoagulation: SCDs. Monitor for any signs of DVT  3. Pain Management: Tylenol as needed, Kpad, analgesic topical ordered  -continue elevation of LUE  -needs to use kpad to neck shoulder 4. Hypertension. Lopressor 12.5 mg twice a day. Monitor with increased mobility  5. Neuropsych: This patient is capable of making decisions on her own behalf.   -added low dose celexa which the patient now refuses (after agreeing to start earlier in week)---dc  -in better spirits currently. Responds to + reinforcement 6. Hyperlipidemia. Lipitor   LOS (Days) 16 A FACE TO FACE EVALUATION WAS PERFORMED  Ewald Beg T 09/11/2013, 8:Carla AM

## 2013-09-11 NOTE — Progress Notes (Signed)
Physical Therapy Session Note  Patient Details  Name: [name] MRN: [id number] Date of Birth: 1950/10/15  Today's Date: 09/11/2013 Time: 4854-6270 and 3500-9381 Time Calculation (min): 61 min and 35 min  Short Term Goals: Week 2:  PT Short Term Goal 1 (Week 2): Pt will consistently perform supine>sit with supervision and 25% cueing for technique. PT Short Term Goal 2 (Week 2): Pt will consistently perform sit>supine with supervision and 25% cueing for technique. PT Short Term Goal 3 (Week 2): Pt will perform sit>stand with supervision and 25% cueing for setup. PT Short Term Goal 4 (Week 2): Pt will perform dynamic standing balance x1 minute without UE support with min A. PT Short Term Goal 5 (Week 2): Pt will perform gait x150' in controlled environment with LRAD and min guard.  Skilled Therapeutic Interventions/Progress Updates:    Treatment Session 1: Pt received seated in w/c accompanied by daughter; agreeable to therapy. Session focused on continued hands-on family education/training with focus on curb negotiation and car transfer. Pt performed w/c mobility x170' in controlled environment with R hemi technique and supervision. Donned L AFO (Reaction) for ankle dorsiflexion assist. In weightbearing and dynamic standing, [place] Reaction maintains more normalized L ankle posture (less supination) than WalkOn. Pt performed gait x80' in controlled environment with [facility] and min guard of daughter, who provided safe guarding technique and appropriate cueing for full lateral weight shift to R prior to initiating LLE advancement. Pt/daughter explained that pt must negotiate single step without rails (prior to negotiating 2 stairs)  to enter home.   In ortho gym, therapist explained, demonstrated negotiation of standard curb/step without rails using LBQC. Pt then performed x3 trials total with mod A of this therapist during initial trial and min A of daughter during remaining 2 trials. Verbal cueing  focused on sequencing with effective within-session carryover. Daughter with safe assist/appropriate cueing; pt will need reinforcement. Pt performed stand pivot transfers from w/c<>simulated car x3 trials with min guard-min A of daughter; daughter provided safe assist and appropriate cueing for LLE management using RLE. Min cueing required for reclining seat to facilitate independence with LLE management. Session ended in pt room, where pt was left seated in w/c with daughter present and all needs within reach.  Treatment Session 2: Pt received seated in w/c wearing L AFO; agreeable to therapy. Session focused on increasing pt stability/independence with community mobility. Pt performed w/c mobility x200' total in controlled and community environment with R hemi technique and supervision, increased time required in community setting. Pt performed gait 2x30' in community environment (inside and outside hospital lobby) with [facility] and min guard-min A; no overt LOB. Seated rest break on outdoor bench between gait trials. During community mobility, pt demonstrated selective attention with supervision. Upon returning to rehab unit, pt performed toilet transfer with min guard-min A. During toileting, verbal cueing focused on widening BOS to increase stability with dynamic standing; pt gave effective return demonstration.  Long term goal added to address curb negotiation secondary to pt needing to negotiate single step without rails to enter home.  Therapy Documentation Precautions:  Precautions Precautions: Fall Precaution Comments: Lt hemiparesis Restrictions Weight Bearing Restrictions: No Vital Signs: Therapy Vitals Temp: 97.4 F (36.3 C) Temp src: Oral Pulse Rate: 62 Resp: 18 BP: 125/72 mmHg Oxygen Therapy SpO2: 97 % O2 Device: None (Room air) Pain: Pain Assessment Pain Assessment: No/denies pain Locomotion : Ambulation Ambulation/Gait Assistance: 4: Min guard Wheelchair  Mobility Distance: 170   See FIM for current functional  status  Therapy/Group: Individual Therapy  Masai Kidd, Malva Cogan 09/11/2013, 10:10 AM

## 2013-09-11 NOTE — Progress Notes (Signed)
Occupational Therapy Session Note  Patient Details  Name: [name] MRN: [id number] Date of Birth: 15-Oct-1950  Today's Date: 09/11/2013 Time: 1026-1126 [id number] Time Calculation (min): 60 min and 30 min  Short Term Goals: Week 3:  OT Short Term Goal 1 (Week 3): STG = LTGs due to remaining LOS  Skilled Therapeutic Interventions/Progress Updates:    Session 1 (6789-3810): Pt with no c/o pain. Upon entering the room, patients family present. Daughter reports, "I just helped her with her bathing and dressing." OT interventions focused on sit to stands, patient education, weight bearing technique, bed positioning, and self ROM for the L hemiplegic UE.  Pt with complaints of not sleeping well at night when sleeping on her back. OT demonstrated how to properly position self in R side laying position while protecting the hemiplegic technique so that pt may direct care. OT demonstrated self ROM exercises for L UE hand,wrist, elbow, and shoulder in all planes of movement. Pt returned demonstration with 15 reps of each exercise. OT educated pt on weight bearing through L UE when standing at sinkside for grooming activities and hand position for sit to stands from wheelchair in order to provide input to hemiplegic side.   Session 2 (1400 1430): Pt with no c/o pain during session and is seated in wheelchair upon entering the room. OT session focused on functional transfers, weight bearing, and UE exercises. Pt. Performed toilet transfer >toilet with use of grab bar and steady assist. Pt stood from toilet with verbal cues and use of quad cane with SBA. Pt demonstrated self ROM exercises as previously educated for elbow flexion/ext, sup/pronation, and shoulder flexion x 10 reps each to demonstrate understanding. Assist from therapist required to hold L hand in place and for foot placement for wheelchair push ups with B UEs  X 2 reps. Pt making multiple attempts and able to clear bottom 2/10 attempts. Pt making  progress towards goals.   Therapy Documentation Precautions:  Precautions Precautions: Fall Precaution Comments: Lt hemiparesis Restrictions Weight Bearing Restrictions: No     See FIM for current functional status  Therapy/Group: Individual Therapy  Phineas Semen 09/11/2013, 3:10 PM

## 2013-09-12 ENCOUNTER — Inpatient Hospital Stay (HOSPITAL_COMMUNITY): Payer: Medicare Other

## 2013-09-12 ENCOUNTER — Inpatient Hospital Stay (HOSPITAL_COMMUNITY): Payer: Medicare Other | Admitting: Occupational Therapy

## 2013-09-12 ENCOUNTER — Inpatient Hospital Stay (HOSPITAL_COMMUNITY): Payer: Medicare Other | Admitting: Physical Therapy

## 2013-09-12 NOTE — Progress Notes (Signed)
Occupational Therapy Session Note  Patient Details  Name: [name] MRN: [id number] Date of Birth: 21-Mar-1950  Today's Date: 09/12/2013 Time: 1000-1100 Time Calculation (min): 60 min  Short Term Goals: Week 3:  OT Short Term Goal 1 (Week 3): STG = LTGs due to remaining LOS  Skilled Therapeutic Interventions/Progress Updates:   Pt with no c/o pain during session. Upon entering the room, pt seated in recliner chair with daughter (caregiver) and grandchild present. OT session focused on ADL training, dynamic standing balance, energy conservation, patient and family education, functional transfers, and functional mobility. Pt ambulated to bathroom with Min A for balance and use of quad cane. Shower transfer onto tub bench with Min A for safety. OT educated and demonstrated how to utilize long handled sponge to wash B feet and back in order to increase I with bathing. Pt demonstrated understanding and ability to perform these actions with AE. Pt engaged in dressing at sink side by sitting in wheelchair and standing for LB dressing. OT educated pt and family member on energy conservation techniques for ADLs and well as general principles. Education also regarding fall risks within the home and bathroom that could be addressed prior to patient discharge such as removal of throw rugs. Patient and caregiver verbalized understanding.  Therapy Documentation Precautions:  Precautions Precautions: Fall Precaution Comments: Lt hemiparesis Restrictions Weight Bearing Restrictions: No Pain: Pain Assessment Pain Assessment: No/denies pain  See FIM for current functional status  Therapy/Group: Individual Therapy  Phineas Semen 09/12/2013, 1:02 PM

## 2013-09-12 NOTE — Progress Notes (Signed)
Physical Therapy Session Note  Patient Details  Name: [name] MRN: [id number] Date of Birth: 1950/09/19  Today's Date: 09/12/2013 Time: 3833-3832 Time Calculation (min): 30 min  Short Term Goals: Week 1:  PT Short Term Goal 1 (Week 1): Pt will perform supine>sit with with HOB flat using rail requiring min A and 25% cueing for sequencing. PT Short Term Goal 1 - Progress (Week 1): Partly met PT Short Term Goal 2 (Week 1): Pt will perform sit>supine with HOB flat requiring min A and 25% cueing for sequencing. PT Short Term Goal 2 - Progress (Week 1): Partly met PT Short Term Goal 3 (Week 1): Pt will perform bed<>chair transfer with max A and 50% cueing for technique and sequencing. PT Short Term Goal 3 - Progress (Week 1): Met PT Short Term Goal 4 (Week 1): Pt will perform w/c mobility x150' in controlled environment with supervision and 25% cueing for technique. PT Short Term Goal 4 - Progress (Week 1): Met PT Short Term Goal 5 (Week 1): Pt will perform gait x50' in controlled environment with Total A of single therapist and 75% cueing. PT Short Term Goal 5 - Progress (Week 1): Met Week 2:  PT Short Term Goal 1 (Week 2): Pt will consistently perform supine>sit with supervision and 25% cueing for technique. PT Short Term Goal 2 (Week 2): Pt will consistently perform sit>supine with supervision and 25% cueing for technique. PT Short Term Goal 3 (Week 2): Pt will perform sit>stand with supervision and 25% cueing for setup. PT Short Term Goal 4 (Week 2): Pt will perform dynamic standing balance x1 minute without UE support with min A. PT Short Term Goal 5 (Week 2): Pt will perform gait x150' in controlled environment with LRAD and min guard.  Skilled Therapeutic Interventions/Progress Updates:  Pt. Received seated in w/c with grandson present. Pt. Agreeable to physical therapy.  1:1 Treatment focused on NMReEd related to transfers and gait. Pt. Assist level is min guard assist with [facility]  and orthotic donned on L foot. Treatment consisted of navigating obstacle course with step-over, step-onto, navigation around, and navigation through constricted openings x 3 trials with additional trials for step-over with L LE and positioning of LBQC. Pt. Required min A to prevent LOB with new activity and short demonstration/explaination followed by teach-back. PT. Tolerated trial well, but needs additional training for functional gait with environment obstacles for increased safety and proper management techniques. Orthotist present during this session to assess/address patient appropriateness for L ankle-foot orthotic (AFO) and L shoe modification to increase safety/independence with ambulation. Patient, orthotist, and primary PT all in agreement to pursue L [place] Reaction for dorsiflexion assist, supination strap to prevent L foot/ankle injury, and toe cap on L shoe to prevent L toe catch during gait.  Pain: no c/o pain.  Pt. Condition post therapy session: positioned seated in w/c with all needs within reach.Grandson present.      Therapy Documentation Precautions:  Precautions Precautions: Fall Precaution Comments: Lt hemiparesis Restrictions Weight Bearing Restrictions: No Locomotion : Ambulation Ambulation/Gait Assistance: 4: Min guard Wheelchair Mobility Distance: 200   See FIM for current functional status  Therapy/Group: Individual Therapy  Juluis Mire 09/12/2013, 4:42 PM

## 2013-09-12 NOTE — Progress Notes (Signed)
Occupational Therapy Session Note  Patient Details  Name: [name] MRN: [id number] Date of Birth: 05/07/1950  Today's Date: 09/12/2013 Time: 1400-1455 Time Calculation (min): 55 min  Short Term Goals: Week 3:  OT Short Term Goal 1 (Week 3): STG = LTGs due to remaining LOS  Skilled Therapeutic Interventions/Progress Updates:  Therapeutic Activity: sit><stands, standing balance, functional transfers, bed mobility and bed positioning, left hand edema control Neuromuscular Facilitation: Left;Lower Extremity;Upper Extremity;Forced use;Activity to increase timing and sequencing;Activity to increase motor control;Activity to increase grading;Activity to increase sustained activation;Activity to increase anterior-posterior weight shifting;Activity to increase lateral weight shifting Manual Therapy: Edema management Edema Management: Positioning, PROM and self ROM exercises, retrograde massage LUE Weight Bearing Technique: Extended arm standing;Extended arm seated (forearm/elbow in side lying and side sitting) Response to Weight Bearing Technique: Increased active elbow flexion in sidelying  Therapy Documentation Precautions:  Precautions Precautions: Fall Precaution Comments: Lt hemiparesis Restrictions Weight Bearing Restrictions: No Pain: Denies pain  Therapy/Group: Individual Therapy  Aiana Nordquist 09/12/2013, 4:44 PM

## 2013-09-12 NOTE — Progress Notes (Signed)
Subjective/Complaints: 63 y.o. right handed female with history of hypertension. Patient lives alone and was independent prior to admission. Admitted 08/23/2013 with left-sided weakness of acute onset after returning home from work as well as slurred speech. MRI showed acute infarction within the right posterior limb internal capsule/corona radiata. MRA of the head with no major occlusion or stenosis. Echocardiogram with ejection fraction of 14% grade 1 diastolic dysfunction. Carotid Dopplers with no ICA stenosis. Patient did not receive TPA. Neurology consulted placed on aspirin for CVA prophylaxis as well as enrollment in the Point trial study.    Had a good night. Anxious to get home. Pain under control. Review of Systems -otherwise neg  Objective: Vital Signs: Blood pressure 118/75, pulse 68, temperature 98.2 F (36.8 C), temperature source Oral, resp. rate 16, height 5\' 3"  (1.6 m), weight 110.6 kg (243 lb 13.3 oz), SpO2 95.00%. No results found. No results found for this or any previous visit (from the past 72 hour(s)).   HEENT: normal Cardio: RRR, no murmurs Resp: CTA B/L GI: BS positive Extremity:  Warm legs, minimal edema Skin:   Intact Neuro: Flat, Abnormal Motor 0/5 LUE, 1+ L HF, 0/5 L foot/ankle , 3- Knee ext with Hip ext synergy  and Dysarthric Musc/Skel:  Normal, mild left shoulder pain with ER/IR.  Minimal edema LUE Gen NAD   Assessment/Plan: 1. Functional deficits secondary to R PLIC infarct which require 3+ hours per day of interdisciplinary therapy in a comprehensive inpatient rehab setting. Physiatrist is providing close team supervision and 24 hour management of active medical problems listed below. Physiatrist and rehab team continue to assess barriers to discharge/monitor patient progress toward functional and medical goals.  FIM: FIM - Bathing Bathing Steps Patient Completed: Chest;Left Arm;Abdomen;Front perineal area;Right upper leg;Left upper  leg;Buttocks Bathing: 3: Mod-Patient completes 5-7 16f 10 parts or 50-74%  FIM - Upper Body Dressing/Undressing Upper body dressing/undressing steps patient completed: Thread/unthread right sleeve of pullover shirt/dresss;Thread/unthread left sleeve of pullover shirt/dress;Put head through opening of pull over shirt/dress;Pull shirt over trunk Upper body dressing/undressing: 5: Set-up assist to: Obtain clothing/put away FIM - Lower Body Dressing/Undressing Lower body dressing/undressing steps patient completed: Thread/unthread right underwear leg;Thread/unthread left underwear leg;Pull underwear up/down;Thread/unthread right pants leg;Thread/unthread left pants leg;Pull pants up/down;Don/Doff right sock;Don/Doff right shoe Lower body dressing/undressing: 4: Min-Patient completed 75 plus % of tasks  FIM - Toileting Toileting steps completed by patient: Adjust clothing prior to toileting;Performs perineal hygiene;Adjust clothing after toileting Toileting Assistive Devices: Grab bar or rail for support Toileting: 4: Steadying assist  FIM - Radio producer Devices: Grab bars Toilet Transfers: 5-To toilet/BSC: Supervision (verbal cues/safety issues);4-From toilet/BSC: Min A (steadying Pt. > 75%)  FIM - Bed/Chair Transfer Bed/Chair Transfer Assistive Devices: [facility]) Bed/Chair Transfer: 4: Bed > Chair or W/C: Min A (steadying Pt. > 75%);4: Chair or W/C > Bed: Min A (steadying Pt. > 75%)  FIM - Locomotion: Wheelchair Distance: 200 Locomotion: Wheelchair: 5: Travels 150 ft or more: maneuvers on rugs and over door sills with supervision, cueing or coaxing FIM - Locomotion: Ambulation Locomotion: Ambulation Assistive Devices: Cane - Quad;Orthosis Ambulation/Gait Assistance: 4: Min assist Locomotion: Ambulation: 2: Travels 50 - 149 ft with minimal assistance (Pt.>75%)  Comprehension Comprehension Mode: Auditory Comprehension: 5-Understands complex 90% of the  time/Cues < 10% of the time  Expression Expression Mode: Verbal Expression: 6-Expresses complex ideas: With extra time/assistive device  Social Interaction Social Interaction Mode: Asleep Social Interaction: 6-Interacts appropriately with others with medication or extra time (  anti-anxiety, antidepressant).  Problem Solving Problem Solving Mode: Asleep Problem Solving: 6-Solves complex problems: With extra time  Memory Memory Mode: Asleep Memory: 6-More than reasonable amt of time  Medical Problem List and Plan:  1. Functional deficits secondary to right PLIC infarcts secondary to small vessel disease  2. DVT Prophylaxis/Anticoagulation: SCDs. Monitor for any signs of DVT  3. Pain Management: Tylenol as needed, Kpad, analgesic topical ordered  -continue elevation of LUE  -  kpad to neck/ shoulder 4. Hypertension. Lopressor 12.5 mg twice a day. Monitor with increased mobility  5. Neuropsych: This patient is capable of making decisions on her own behalf.   -added low dose celexa which the patient ultimately refused----has been stopped  -in better spirits currently. Responds to + reinforcement 6. Hyperlipidemia. Lipitor   LOS (Days) 17 A FACE TO FACE EVALUATION WAS PERFORMED  Dupree Givler T 09/12/2013, 9:05 AM

## 2013-09-12 NOTE — Progress Notes (Signed)
Orthopedic Tech Progress Note Patient Details:  [name] 1950/10/19 [id number] Advanced called for brace Patient ID: [name], female   DOB: [birth year], 63 y.o.   MRN: [id number]   [name] 09/12/2013, 2:58 PM

## 2013-09-12 NOTE — Progress Notes (Signed)
Physical Therapy Weekly Progress Note  Patient Details  Name: [name] MRN: [id number] Date of Birth: [birth year]  Beginning of progress report period: September 05, 2013 End of progress report period: September 12, 2013  Today's Date: 09/12/2013 Time: 1117-1205 Time Calculation (min): 48 min  Patient has met 5 of 5 short term goals.  Pt continuing to demonstrate improvement in stability/independence with bed mobility, functional transfers, postural/gait stability, stair negotiation, and community mobility.   Patient continues to demonstrate the following deficits: decreased standing balance, decreased postural control, hemiplegia and decreased balance strategies and therefore will continue to benefit from skilled PT intervention to enhance overall performance with activity tolerance, balance, postural control, ability to compensate for deficits and functional use of  left upper extremity and left lower extremity.  Patient progressing toward long term goals..  Continue plan of care.  PT Short Term Goals Week 1:  PT Short Term Goal 1 (Week 1): Pt will perform supine>sit with with HOB flat using rail requiring min A and 25% cueing for sequencing. PT Short Term Goal 1 - Progress (Week 1): Partly met PT Short Term Goal 2 (Week 1): Pt will perform sit>supine with HOB flat requiring min A and 25% cueing for sequencing. PT Short Term Goal 2 - Progress (Week 1): Partly met PT Short Term Goal 3 (Week 1): Pt will perform bed<>chair transfer with max A and 50% cueing for technique and sequencing. PT Short Term Goal 3 - Progress (Week 1): Met PT Short Term Goal 4 (Week 1): Pt will perform w/c mobility x150' in controlled environment with supervision and 25% cueing for technique. PT Short Term Goal 4 - Progress (Week 1): Met PT Short Term Goal 5 (Week 1): Pt will perform gait x50' in controlled environment with Total A of single therapist and 75% cueing. PT Short Term Goal 5 - Progress (Week 1): Met Week 2:   PT Short Term Goal 1 (Week 2): Pt will consistently perform supine>sit with supervision and 25% cueing for technique. PT Short Term Goal 1 - Progress (Week 2): Met PT Short Term Goal 2 (Week 2): Pt will consistently perform sit>supine with supervision and 25% cueing for technique. PT Short Term Goal 2 - Progress (Week 2): Met PT Short Term Goal 3 (Week 2): Pt will perform sit>stand with supervision and 25% cueing for setup. PT Short Term Goal 3 - Progress (Week 2): Met PT Short Term Goal 4 (Week 2): Pt will perform dynamic standing balance x1 minute without UE support with min A. PT Short Term Goal 4 - Progress (Week 2): Met PT Short Term Goal 5 (Week 2): Pt will perform gait x150' in controlled environment with LRAD and min guard. PT Short Term Goal 5 - Progress (Week 2): Met Week 3:  PT Short Term Goal 1 (Week 3): STG's=LTG's secondary to anticipated LOS.  Skilled Therapeutic Interventions/Progress Updates:    Pt received seated in w/c with L AFO on accompanied by daughter and granddaughter; agreeable to therapy. Session focused on continued hands-on family training with emphasis on community mobility. Transported pt to hospital lobby in w/c with Total A for energy conservation. Pt performed gait x51' in community environment (outside on sidewalk with <3% incline/decline) and over door sills with [facility] and min guard; no overt LOB. Pt negotiated standard curb (outside) x2 trials with [facility] and min A of daughter during initial trial, min guard of daughter during subsequent trial. W/c mobility x200' in community environment with R hemi technique requiring supervision; negotiated  3% grade with mod I but required cueing to negotiate obstacles in gift shop. Session ended in pt room, where pt was left seated in w/c with daughter present and all needs within reach. Pt agreeable to pursuing L AFO and toe cap on L shoe, if necessary. Pt also agreeable to using wheelchair for community mobility.  Per pt, family  members (who haven't yet received hands-on training) assisted pt with ambulation outside hospital lobby yesterday.This therapist educated pt/daughter on strong recommendation that pt perform mobility within room only when trained family member (daughter, Aniceto Boss) is providing assistance. Pt/daughter verbalized understanding.   Therapy Documentation Precautions:  Precautions Precautions: Fall Precaution Comments: Lt hemiparesis Restrictions Weight Bearing Restrictions: No Pain: Pain Assessment Pain Assessment: No/denies pain Locomotion : Ambulation Ambulation/Gait Assistance: 4: Min assist Wheelchair Mobility Distance: 150   See FIM for current functional status  Therapy/Group: Individual Therapy  Kashena Novitski, Malva Cogan 09/12/2013, 12:27 PM

## 2013-09-13 ENCOUNTER — Inpatient Hospital Stay (HOSPITAL_COMMUNITY): Payer: Medicare Other | Admitting: *Deleted

## 2013-09-13 DIAGNOSIS — I5032 Chronic diastolic (congestive) heart failure: Secondary | ICD-10-CM

## 2013-09-13 DIAGNOSIS — I6350 Cerebral infarction due to unspecified occlusion or stenosis of unspecified cerebral artery: Secondary | ICD-10-CM

## 2013-09-13 DIAGNOSIS — M129 Arthropathy, unspecified: Secondary | ICD-10-CM

## 2013-09-13 DIAGNOSIS — R7309 Other abnormal glucose: Secondary | ICD-10-CM

## 2013-09-13 NOTE — Progress Notes (Signed)
Occupational Therapy Session Note  Patient Details  Name: [name] MRN: [id number] Date of Birth: April 23, 1950  Today's Date: 09/13/2013 Time:  - 1600-1645  (45 min)    Short Term Goals: Week 1:  OT Short Term Goal 1 (Week 1): Pt will complete bathing with mod assist of 1 caregiver at sit > stand level OT Short Term Goal 1 - Progress (Week 1): Met OT Short Term Goal 2 (Week 1): Pt will complete LB dressing with max assist of 1 caregiver at sit > stand level OT Short Term Goal 2 - Progress (Week 1): Met OT Short Term Goal 3 (Week 1): Pt will complete UB dressing at mod assist OT Short Term Goal 3 - Progress (Week 1): Met OT Short Term Goal 4 (Week 1): Pt will complete toilet transfer max assist of 1 caregiver OT Short Term Goal 4 - Progress (Week 1): Met OT Short Term Goal 5 (Week 1): Pt will complete 1/3 toileting tasks with max assist of 1 caregiver OT Short Term Goal 5 - Progress (Week 1): Met Week 2:  OT Short Term Goal 1 (Week 2): Pt will complete bathing with min assist at sit > stand level OT Short Term Goal 1 - Progress (Week 2): Met OT Short Term Goal 2 (Week 2): Pt will complete LB dressing with mod assist at sit > stand level with AE as needed OT Short Term Goal 2 - Progress (Week 2): Met OT Short Term Goal 3 (Week 2): Pt will complete toilet transfer with min assist OT Short Term Goal 3 - Progress (Week 2): Met OT Short Term Goal 4 (Week 2): Pt will complete tub/shower transfer with min assist with use of tub transfer bench OT Short Term Goal 4 - Progress (Week 2): Met OT Short Term Goal 5 (Week 2): Pt will utilize LUE as stabilizer during self-care tasks with <10% cues. OT Short Term Goal 5 - Progress (Week 2): Met  Skilled Therapeutic Interventions/Progress Updates:       Therapeutic Activity: sit><stands, standing balance, functional transfers, left hand edema control>  Functional mobility with increased muscle activation and weight bearing on LUE, LLE,  Incorporated LUE  isometric arm swing with stepping.   Neuromuscular Facilitation: Left;Lower Extremity;Upper Extremity;Forced use;Activity to increase timing and sequencing;Activity to increase motor control;Activity to increase grading;Activity to increase sustained activation;  Manual Therapy: Edema management  Edema Management: Positioning, PROM and self ROM exercises, retrograde massage  LUE Weight Bearing Technique: Extended elbow in  standing;Extended arm seated (forearm/elbow in side lying and side sitting)  Response to Weight Bearing Technique: Increased active elbow flexion in sitting. Shoulder depression, protraction, retraction.  Therapy Documentation Precautions:  Precautions Precautions: Fall Precaution Comments: Lt hemiparesis Restrictions Weight Bearing Restrictions: No     Pain:  none      Other Treatments:    See FIM for current functional status  Therapy/Group: Individual Therapy  Lisa Roca 09/13/2013, 3:56 PM

## 2013-09-13 NOTE — Progress Notes (Signed)
[name] is a 63 y.o. female [birth year] [id number]  Subjective: No new complaints. No new problems. Slept well. Feeling OK.  Objective: Vital signs in last 24 hours: Temp:  [98 F (36.7 C)-98.2 F (36.8 C)] 98.2 F (36.8 C) (08/08 0557) Pulse Rate:  [65-85] 65 (08/08 0557) Resp:  [16] 16 (08/08 0557) BP: (109-134)/(69-76) 109/69 mmHg (08/08 0557) SpO2:  [96 %-98 %] 96 % (08/08 0557) Weight change:  Last BM Date: 09/13/13  Intake/Output from previous day: 08/07 0701 - 08/08 0700 In: 840 [P.O.:840] Out: -  Last cbgs: CBG (last 3)  No results found for this basename: GLUCAP,  in the last 72 hours   Physical Exam General: No apparent distress   HEENT: not dry Lungs: Normal effort. Lungs clear to auscultation, no crackles or wheezes. Cardiovascular: Regular rate and rhythm, no edema Abdomen: S/NT/ND; BS(+) Musculoskeletal:  unchanged Neurological: No new neurological deficits Wounds: N/A    Skin: clear  Aging changes Mental state: Alert, oriented, cooperative    Lab Results: BMET    Component Value Date/Time   NA 142 08/27/2013 0636   K 4.3 08/27/2013 0636   CL 103 08/27/2013 0636   CO2 28 08/27/2013 0636   GLUCOSE 108* 08/27/2013 0636   BUN 15 08/27/2013 0636   CREATININE 0.74 08/27/2013 0636   CALCIUM 8.9 08/27/2013 0636   GFRNONAA 89* 08/27/2013 0636   GFRAA >90 08/27/2013 0636   CBC    Component Value Date/Time   WBC 5.2 08/27/2013 0636   RBC 4.25 08/27/2013 0636   HGB 12.3 08/27/2013 0636   HCT 38.3 08/27/2013 0636   PLT 220 08/27/2013 0636   MCV 90.1 08/27/2013 0636   MCH 28.9 08/27/2013 0636   MCHC 32.1 08/27/2013 0636   RDW 14.6 08/27/2013 0636   LYMPHSABS 2.7 08/27/2013 0636   MONOABS 0.6 08/27/2013 0636   EOSABS 0.3 08/27/2013 0636   BASOSABS 0.0 08/27/2013 0636    Studies/Results: No results found.  Medications: I have reviewed the patient's current medications.  Assessment/Plan:  1. Functional deficits secondary to right PLIC infarcts secondary to  small vessel disease  2. DVT Prophylaxis/Anticoagulation: SCDs. Monitor for any signs of DVT  3. Pain Management: Tylenol as needed, Kpad, analgesic topical ordered  -continue elevation of LUE  - k-pad to neck/ shoulder  4. Hypertension. Lopressor 12.5 mg twice a day. Monitor with increased mobility  5. Neuropsych: This patient is capable of making decisions on her own behalf.  -added low dose celexa which the patient ultimately refused----has been stopped  -in better spirits currently. Responds to + reinforcement  6. Hyperlipidemia. Lipitor  Cont Rx     Length of stay, days: [place] , MD 09/13/2013, 10:22 AM

## 2013-09-14 ENCOUNTER — Inpatient Hospital Stay (HOSPITAL_COMMUNITY): Payer: Medicare Other | Admitting: *Deleted

## 2013-09-14 DIAGNOSIS — K029 Dental caries, unspecified: Secondary | ICD-10-CM

## 2013-09-14 DIAGNOSIS — E785 Hyperlipidemia, unspecified: Secondary | ICD-10-CM

## 2013-09-14 DIAGNOSIS — D649 Anemia, unspecified: Secondary | ICD-10-CM

## 2013-09-14 DIAGNOSIS — L919 Hypertrophic disorder of the skin, unspecified: Secondary | ICD-10-CM

## 2013-09-14 DIAGNOSIS — L909 Atrophic disorder of skin, unspecified: Secondary | ICD-10-CM

## 2013-09-14 DIAGNOSIS — I10 Essential (primary) hypertension: Secondary | ICD-10-CM

## 2013-09-14 NOTE — Progress Notes (Signed)
[name] is a 63 y.o. female 04-Sep-1950 [id number]  Subjective: No new complaints. No new problems. Slept well. Feeling better.  Objective: Vital signs in last 24 hours: Temp:  [98.2 F (36.8 C)-98.5 F (36.9 C)] 98.5 F (36.9 C) (08/09 0628) Pulse Rate:  [71-79] 73 (08/09 0816) Resp:  [16-19] 16 (08/09 0628) BP: (94-153)/(54-77) 115/72 mmHg (08/09 0816) SpO2:  [96 %-98 %] 98 % (08/09 0628) Weight change:  Last BM Date: 09/13/13  Intake/Output from previous day: 08/08 0701 - 08/09 0700 In: 840 [P.O.:840] Out: -  Last cbgs: CBG (last 3)  No results found for this basename: GLUCAP,  in the last 72 hours   Physical Exam General: No apparent distress   HEENT: not dry Lungs: Normal effort. Lungs clear to auscultation, no crackles or wheezes. Cardiovascular: Regular rate and rhythm, no edema Abdomen: S/NT/ND; BS(+) Musculoskeletal:  unchanged Neurological: No new neurological deficits Wounds: N/A    Skin: clear  Aging changes Mental state: Alert, oriented, cooperative    Lab Results: BMET    Component Value Date/Time   NA 142 08/27/2013 0636   K 4.3 08/27/2013 0636   CL 103 08/27/2013 0636   CO2 28 08/27/2013 0636   GLUCOSE 108* 08/27/2013 0636   BUN 15 08/27/2013 0636   CREATININE 0.74 08/27/2013 0636   CALCIUM 8.9 08/27/2013 0636   GFRNONAA 89* 08/27/2013 0636   GFRAA >90 08/27/2013 0636   CBC    Component Value Date/Time   WBC 5.2 08/27/2013 0636   RBC 4.25 08/27/2013 0636   HGB 12.3 08/27/2013 0636   HCT 38.3 08/27/2013 0636   PLT 220 08/27/2013 0636   MCV 90.1 08/27/2013 0636   MCH 28.9 08/27/2013 0636   MCHC 32.1 08/27/2013 0636   RDW 14.6 08/27/2013 0636   LYMPHSABS 2.7 08/27/2013 0636   MONOABS 0.6 08/27/2013 0636   EOSABS 0.3 08/27/2013 0636   BASOSABS 0.0 08/27/2013 0636    Studies/Results: No results found.  Medications: I have reviewed the patient's current medications.  Assessment/Plan:  1. Functional deficits secondary to right PLIC infarcts  secondary to small vessel disease  2. DVT Prophylaxis/Anticoagulation: SCDs. Monitor for any signs of DVT  3. Pain Management: Tylenol as needed, Kpad, analgesic topical ordered  -continue elevation of LUE  - k-pad to neck/ shoulder  4. Hypertension. Lopressor 12.5 mg twice a day. Monitor with increased mobility  5. Neuropsych: This patient is capable of making decisions on her own behalf.  -added low dose celexa which the patient ultimately refused----has been stopped  -in better spirits currently. Responds to + reinforcement  6. Hyperlipidemia. Lipitor  Cont current Rx     Length of stay, days: [place] , MD 09/14/2013, 9:26 AM

## 2013-09-14 NOTE — Progress Notes (Signed)
Occupational Therapy Session Note  Patient Details  Name: [name] MRN: [id number] Date of Birth: [birth year]  Today's Date: 09/14/2013 Time:  - 1100-1200  (60 min)    Short Term Goals: Week 1:   Week 2:  OT Short Term Goal 1 (Week 2): Pt will complete bathing with min assist at sit > stand level OT Short Term Goal 1 - Progress (Week 2): Met OT Short Term Goal 2 (Week 2): Pt will complete LB dressing with mod assist at sit > stand level with AE as needed OT Short Term Goal 2 - Progress (Week 2): Met OT Short Term Goal 3 (Week 2): Pt will complete toilet transfer with min assist OT Short Term Goal 3 - Progress (Week 2): Met OT Short Term Goal 4 (Week 2): Pt will complete tub/shower transfer with min assist with use of tub transfer bench OT Short Term Goal 4 - Progress (Week 2): Met OT Short Term Goal 5 (Week 2): Pt will utilize LUE as stabilizer during self-care tasks with <10% cues. OT Short Term Goal 5 - Progress (Week 2): Met  Skilled Therapeutic Interventions/Progress Updates:    Pt. Sitting on EOB.  Ambulated with QC to toilet with min to SBA.  Performed toileting SBA.  Ambulated to sink.  Stood for 5 minutes for lower body bathing.  Provided set up for needs. Assisted pt with posterior peri cleaning for thoroughness.   Pt.stood x3 for LB dressing with distant supervision plus counter top for steadying self.  Pt. Ambulated back to bed area and opening packages for lunch.    Therapy Documentation Precautions:  Precautions Precautions: Fall Precaution Comments: Lt hemiparesis Restrictions Weight Bearing Restrictions: No     Pain:      See FIM for current functional status  Therapy/Group: Individual Therapy  Lisa Roca 09/14/2013, 11:13 AM

## 2013-09-15 ENCOUNTER — Inpatient Hospital Stay (HOSPITAL_COMMUNITY): Payer: Medicare Other | Admitting: Physical Therapy

## 2013-09-15 ENCOUNTER — Encounter (HOSPITAL_COMMUNITY): Payer: Medicare Other | Admitting: Occupational Therapy

## 2013-09-15 ENCOUNTER — Inpatient Hospital Stay (HOSPITAL_COMMUNITY): Payer: Medicare Other | Admitting: Occupational Therapy

## 2013-09-15 DIAGNOSIS — I6330 Cerebral infarction due to thrombosis of unspecified cerebral artery: Secondary | ICD-10-CM

## 2013-09-15 DIAGNOSIS — Z5189 Encounter for other specified aftercare: Secondary | ICD-10-CM

## 2013-09-15 DIAGNOSIS — I10 Essential (primary) hypertension: Secondary | ICD-10-CM

## 2013-09-15 NOTE — Progress Notes (Signed)
Recreational Therapy Session Note  Patient Details  Name: ZO LOUDON MRN: [id number] Date of Birth: 03-01-1950 Today's Date: 09/15/2013  Pain: no c/o Skilled Therapeutic Interventions/Progress Updates: Session focused on discharge planning in reference to leisure pursuits & home safety.  Pt is extremely motivated to regain independence & return to previously enjoyed leisure activities.  Review safety concerns, pt stated understanding.  [place] 09/15/2013, 3:47 PM

## 2013-09-15 NOTE — Progress Notes (Signed)
Subjective/Complaints: 63 y.o. right handed female with history of hypertension. Patient lives alone and was independent prior to admission. Admitted 08/23/2013 with left-sided weakness of acute onset after returning home from work as well as slurred speech. MRI showed acute infarction within the right posterior limb internal capsule/corona radiata. MRA of the head with no major occlusion or stenosis. Echocardiogram with ejection fraction of 08% grade 1 diastolic dysfunction. Carotid Dopplers with no ICA stenosis. Patient did not receive TPA. Neurology consulted placed on aspirin for CVA prophylaxis as well as enrollment in the Point trial study.   No pain overnite, feels like her Left leg is getting stronger  Review of Systems -otherwise neg  Objective: Vital Signs: Blood pressure 106/63, pulse 71, temperature 98.3 F (36.8 C), temperature source Oral, resp. rate 18, height 5\' 3"  (1.6 m), weight 110.6 kg (243 lb 13.3 oz), SpO2 96.00%. No results found. No results found for this or any previous visit (from the past 72 hour(s)).   HEENT: normal Cardio: RRR, no murmurs Resp: CTA B/L GI: BS positive Extremity:  Warm legs, minimal edema Skin:   Intact Neuro: Flat, Abnormal Motor 0/5 LUE, 1+ L HF, 0/5 L foot/ankle , 3- Knee ext with Hip ext synergy  and Dysarthric Musc/Skel:  Normal, mild left shoulder pain with ER/IR.  Minimal edema LUE Gen NAD   Assessment/Plan: 1. Functional deficits secondary to R PLIC infarct which require 3+ hours per day of interdisciplinary therapy in a comprehensive inpatient rehab setting. Physiatrist is providing close team supervision and 24 hour management of active medical problems listed below. Physiatrist and rehab team continue to assess barriers to discharge/monitor patient progress toward functional and medical goals.  FIM: FIM - Bathing Bathing Steps Patient Completed: Chest;Left Arm;Abdomen;Front perineal area;Buttocks;Right upper leg;Left upper  leg;Left lower leg (including foot);Right lower leg (including foot);Right Arm Bathing: 5: Set-up assist to: Open items  FIM - Upper Body Dressing/Undressing Upper body dressing/undressing steps patient completed: Thread/unthread right sleeve of pullover shirt/dresss;Thread/unthread left sleeve of pullover shirt/dress;Put head through opening of pull over shirt/dress;Pull shirt over trunk Upper body dressing/undressing: 5: Set-up assist to: Obtain clothing/put away FIM - Lower Body Dressing/Undressing Lower body dressing/undressing steps patient completed: Thread/unthread right underwear leg;Thread/unthread left underwear leg;Pull underwear up/down;Thread/unthread right pants leg;Thread/unthread left pants leg;Pull pants up/down;Don/Doff right sock;Don/Doff right shoe Lower body dressing/undressing: 5: Set-up assist to: Obtain clothing  FIM - Toileting Toileting steps completed by patient: Adjust clothing prior to toileting;Performs perineal hygiene;Adjust clothing after toileting Toileting Assistive Devices: Grab bar or rail for support Toileting: 4: Steadying assist  FIM - Radio producer Devices: Grab bars;Oncologist Transfers: 4-To toilet/BSC: Min A (steadying Pt. > 75%);4-From toilet/BSC: Min A (steadying Pt. > 75%)  FIM - Bed/Chair Transfer Bed/Chair Transfer Assistive Devices: Cane;Orthosis Bed/Chair Transfer: 4: Bed > Chair or W/C: Min A (steadying Pt. > 75%);4: Chair or W/C > Bed: Min A (steadying Pt. > 75%)  FIM - Locomotion: Wheelchair Distance: 200 Locomotion: Wheelchair: 5: Travels 150 ft or more: maneuvers on rugs and over door sills with supervision, cueing or coaxing FIM - Locomotion: Ambulation Locomotion: Ambulation Assistive Devices: Cane - Quad;Orthosis Ambulation/Gait Assistance: 4: Min guard Locomotion: Ambulation: 1: Travels less than 50 ft with minimal assistance (Pt.>75%)  Comprehension Comprehension Mode: Auditory Comprehension:  5-Understands complex 90% of the time/Cues < 10% of the time  Expression Expression Mode: Verbal Expression: 6-Expresses complex ideas: With extra time/assistive device  Social Interaction Social Interaction Mode: Asleep Social Interaction: 6-Interacts appropriately with others  with medication or extra time (anti-anxiety, antidepressant).  Problem Solving Problem Solving Mode: Asleep Problem Solving: 6-Solves complex problems: With extra time  Memory Memory Mode: Asleep Memory: 6-More than reasonable amt of time  Medical Problem List and Plan:  1. Functional deficits secondary to right PLIC infarcts secondary to small vessel disease  2. DVT Prophylaxis/Anticoagulation: SCDs. Monitor for any signs of DVT  3. Pain Management: Tylenol as needed, Kpad, analgesic topical ordered  -continue elevation of LUE  -  kpad to neck/ shoulder 4. Hypertension. Lopressor 12.5 mg twice a day. Monitor with increased mobility  5. Neuropsych: This patient is capable of making decisions on her own behalf. Adjusting to disability   6. Hyperlipidemia. Lipitor   LOS (Days) 20 A FACE TO FACE EVALUATION WAS PERFORMED  [name] E 09/15/2013, 7:25 AM

## 2013-09-15 NOTE — Progress Notes (Signed)
Occupational Therapy Session Note  Patient Details  Name: [name] MRN: [id number] Date of Birth: 1950-07-28  Today's Date: 09/15/2013 Time: 7121-9758 and 8325-4982 Time Calculation (min): 60 min and 30 min  Short Term Goals: Week 3:  OT Short Term Goal 1 (Week 3): STG = LTGs due to remaining LOS  Skilled Therapeutic Interventions/Progress Updates:    1) Engaged in ADL retraining with focus on sit > stand, dynamic standing balance, and functional transfers.  Upon arrival pt reporting need to toilet.  Ambulated to bathroom with [facility] with min assist when passing through doorway/over ledge and when turning, progressing to SBA with straightaways.  Pt completed toileting with supervision.  Completed bathing and dressing at sit > stand level at sink.  Pt demonstrating carryover of LLE and LUE placement prior to transitional movements to increase safety and independence.  Completed tub/shower transfer with use of tub transfer bench with back piece removed.  Pt able to lean back enough to bring LLE over tub ledge with Rt hand without assistance from therapist, however required min assist initially for steadying, progressing to supervision during 2nd attempt.    2) NMR in standing with focus on RUE shoulder flexion/extension.  WB through RUE in standing while reaching across midline with therapist providing manual facilitation to improve alignment for weight bearing.  Post WB, pt with increased shoulder flexion/extension with gravity eliminated and support on stool.  Table glides in supported sitting with pt demonstrating increased internal rotation and shoulder flexion.  Therapy Documentation Precautions:  Precautions Precautions: Fall Precaution Comments: Lt hemiparesis Restrictions Weight Bearing Restrictions: No  Pain:  Pt with no c/o pain  See FIM for current functional status  Therapy/Group: Individual Therapy  Daysie Helf, [place] 09/15/2013, 10:15 AM

## 2013-09-15 NOTE — Progress Notes (Signed)
Physical Therapy Session Note  Patient Details  Name: [name] MRN: [id number] Date of Birth: 1951/01/06  Today's Date: 09/15/2013 Time: 1032-1102 and 1941-7408 Time Calculation (min): 30 min and 60 min  Short Term Goals: Week 3:  PT Short Term Goal 1 (Week 3): STG's=LTG's secondary to anticipated LOS.  Skilled Therapeutic Interventions/Progress Updates:   AM Session: Pt received sitting in w/c, agreeable to therapy. Donned socks/shoes and AFO with assist from therapist due to time constraints. Gait training using LBQC and L AFO x 50 ft, supervision-min guard. Pt propelled w/c using R hemi technique x 100 ft with supervision. To simulate home entry, pt negotiated curb step using LBQC with min guard, ambulated using LBQC approx 10 ft for pathway with supervision, then negotiated 2-3 steps using R rail and supervision-min guard, completed in/out simulated home entry x 2. Pt returned to room and left sitting in w/c with all needs within reach.   PM Session: Pt received in therapy gym, handoff from OT. Pt performed stand pivot transfer with [facility] and supervision. For L UE/LE NMR, patient performed tall kneeling on mat with bench placed anterior, LUE WB on bench. Pt performed reaching with RUE across midline and multidirectional reaching outside BOS to obtain ring and place to target. Pt requires min A to achieve tall kneeling for LLE and at LUE to maintain WB position on bench. Following seated rest, pt achieved quadruped on mat, requires min A for LLE and mod A to maintain LUE in extended position, therapist manually facilitating hips over knees to prevent backwards crouch. Pt able to lift RUE off mat for short periods of time < 5 sec. Following seated rest, patient performed floor transfer with visual demonstration and verbal cues for sequencing, min A to get down to floor and +2A to get back up to mat due possible tone resulting in LLE extension when attempting to achieve quadruped position. Pt  returned to mat and performed gait training x 75 ft using LBQC, close supervision. Pt propelled w/c via R hemi technique remainder of distance back to room with mod I and left sitting in w/c with all needs within reach.  Therapy Documentation Precautions:  Precautions Precautions: Fall Precaution Comments: Lt hemiparesis Restrictions Weight Bearing Restrictions: No Pain:  Denies pain  See FIM for current functional status  Therapy/Group: Individual Therapy  Laretta Alstrom 09/15/2013, 12:20 PM

## 2013-09-16 ENCOUNTER — Inpatient Hospital Stay (HOSPITAL_COMMUNITY): Payer: Medicare Other | Admitting: Occupational Therapy

## 2013-09-16 ENCOUNTER — Inpatient Hospital Stay (HOSPITAL_COMMUNITY): Payer: Medicare Other | Admitting: Physical Therapy

## 2013-09-16 ENCOUNTER — Inpatient Hospital Stay (HOSPITAL_COMMUNITY): Payer: Medicare Other | Admitting: *Deleted

## 2013-09-16 ENCOUNTER — Encounter (HOSPITAL_COMMUNITY): Payer: Medicare Other | Admitting: Occupational Therapy

## 2013-09-16 DIAGNOSIS — I6330 Cerebral infarction due to thrombosis of unspecified cerebral artery: Secondary | ICD-10-CM

## 2013-09-16 DIAGNOSIS — I10 Essential (primary) hypertension: Secondary | ICD-10-CM

## 2013-09-16 DIAGNOSIS — Z5189 Encounter for other specified aftercare: Secondary | ICD-10-CM

## 2013-09-16 NOTE — Progress Notes (Signed)
Occupational Therapy Session Note  Patient Details  Name: [name] MRN: [id number] Date of Birth: 1951/02/01  Today's Date: 09/16/2013 Time: 3007-6226 and 3335-4562 Time Calculation (min): 90 min and 33 min  Short Term Goals: Week 3:  OT Short Term Goal 1 (Week 3): STG = LTGs due to remaining LOS  Skilled Therapeutic Interventions/Progress Updates:    1) Engaged in ADL retraining with focus on tub/shower transfers, LB bathing and dressing, and dynamic standing balance.  Completed bathing in ADL tubroom with pt completing tub/shower transfer by hooking Rt foot under Lt foot and bringing BLE over tub ledge while seated on tub transfer bench, steady assist at trunk due to extreme posterior lean to bring legs over ledge.  Steady assist in standing in tub when washing buttocks.  Pt demonstrated carryover of use of long handled sponge to wash feet and RUE.  Problem solving with donning Lt sock and shoe with various attempts, still requiring physical assist.  Introduced wide sock aid still with decreased luck, pt reports plan to have daughter assist with Lt sock but will continue to attempt.  Engaged in therapeutic activity with focus on standing balance and balance reactions while completing Wii dance activity.  Steady assist throughout activity without UE support.    2) NMR with focus trunk control and LUE shoulder flexion/extension. Sit > stands without UE support to challenge trunk control and reaching across midline to challenge balance reactions. WB through RUE in sitting while reaching across midline with therapist providing manual facilitation for weight bearing. Post WB, pt with increased shoulder flexion/extension with ability to bring LUE from mat table into lap.  Performed toilet transfer and toileting with SBA.  Therapy Documentation Precautions:  Precautions Precautions: Fall Precaution Comments: Lt hemiparesis Restrictions Weight Bearing Restrictions: No Pain: Pain  Assessment Pain Assessment: No/denies pain  See FIM for current functional status  Therapy/Group: Individual Therapy  Simonne Come 09/16/2013, 10:21 AM

## 2013-09-16 NOTE — Progress Notes (Signed)
Recreational Therapy Discharge Summary Patient Details  Name: KIYLEE THORESON MRN: [id number] Date of Birth: [birth year] Today's Date: 09/16/2013  Long term goals set: 1  Long term goals met: 1  Comments on progress toward goals: Pt has made great progress toward goal and is ready for discharge home at supervision level for TR tasks.   Pt requires set up assist.  Pt is close supervision-min assist for standing leisure tasks.  Education provided on adapting leisure activities for participation post discharge.  Pt anxious to return home & engage in activities of choice. Reasons for discharge: discharge from hospital  Patient/family agrees with progress made and goals achieved: Yes  John Williamsen 09/16/2013, 12:14 PM

## 2013-09-16 NOTE — Progress Notes (Addendum)
Physical Therapy Session Note  Patient Details  Name: [name] MRN: [id number] Date of Birth: 10-19-1950  Today's Date: 09/16/2013 Time: 1006-1106 Time Calculation (min): 60 min (co-tx with Rec Therapist from 1006-1030)  Short Term Goals: Week 3:  PT Short Term Goal 1 (Week 3): STG's=LTG's secondary to anticipated LOS.  Skilled Therapeutic Interventions/Progress Updates:    Pt received seated in w/c in treatment gym accompanied by rec therapist. Pt agreeable to therapy. Session focused on functional mobility in community setting. Pt suspects that personal L AFO (received yesterday) needs to be adjusted prior to D/C home. Upon donning L AFO, noted that length may need to be modified. Contacted orthotist to address AFO length and to pursue L toe cap prior to D/C.  Pt performed w/c mobility x130' in controlled, home, and community environment with R hemi technique and supervision. Outside hospital lobby, pt performed gait x156' in community environment with L AFO, LBQC and min guard to negotiate sidewalk sloped downward to R. Pt ambulated in gift shop (community environment with narrow walkways, carpet, obstacles, and distractions) with supervision to min guard and no overt LOB. Returned to rehab unit, where pt negotiated 6 steps with single rail, forward-facing with step-to pattern and supervision, increased time. Per pt report that home bed is positioned in a way that requires pt get into bed on L side, focused on supine<>sit with home setup. Pt required supervision, cueing for LUE management during initial trial and performed with mod I, increased time during subsequent trial. Session ended in pt room, where pt was left seated in w/c with all needs within reach and OT present for next session.   Per pt, granddaughter will be providing supervision during day. Therefore, requested that granddaughter come in for PT/OT sessions tomorrow AM for hands-on training/education. Pt/granddaughter verbalized  understanding and in agreement.  Therapy Documentation Precautions:  Precautions Precautions: Fall Precaution Comments: Lt hemiparesis Restrictions Weight Bearing Restrictions: No Pain: Pain Assessment Pain Assessment: No/denies pain Locomotion : Ambulation Ambulation/Gait Assistance: 4: Min guard Wheelchair Mobility Distance: 130   Addendum: downgraded goal for single step/curb negotiation secondary to pt progress. Added long term goal for community mobility secondary to pt progress.  See FIM for current functional status  Therapy/Group: Individual Therapy  Luv Mish, Malva Cogan 09/16/2013, 1:41 PM

## 2013-09-16 NOTE — Progress Notes (Signed)
Recreational Therapy Session Note  Patient Details  Name: RENNEE COYNE MRN: [id number] Date of Birth: 31-Mar-1950 Today's Date: 09/16/2013  Pain: no c/o Skilled Therapeutic Interventions/Progress Updates: Session focused on activity tolerance, standing tolerance/balance, safe mobility in community.  Pt stood to play Wii Just Dance without UE support challenging dynamic standing balance with contact guard assist/min assist.  Pt ambulated through gift shop using [facility] with close supervision.  Therapy/Group: Co-Treatment  [name] 09/16/2013, 12:10 PM

## 2013-09-16 NOTE — Progress Notes (Signed)
Subjective/Complaints: 63 y.o. right handed female with history of hypertension. Patient lives alone and was independent prior to admission. Admitted 08/23/2013 with left-sided weakness of acute onset after returning home from work as well as slurred speech. MRI showed acute infarction within the right posterior limb internal capsule/corona radiata. MRA of the head with no major occlusion or stenosis. Echocardiogram with ejection fraction of 27% grade 1 diastolic dysfunction. Carotid Dopplers with no ICA stenosis. Patient did not receive TPA. Neurology consulted placed on aspirin for CVA prophylaxis as well as enrollment in the Point trial study.   Didn't get my sausage this am Feel some flickering in my left arm  Review of Systems -otherwise neg  Objective: Vital Signs: Blood pressure 136/75, pulse 67, temperature 98.2 F (36.8 C), temperature source Oral, resp. rate 18, height 5\' 3"  (1.6 m), weight 110.6 kg (243 lb 13.3 oz), SpO2 96.00%. No results found. No results found for this or any previous visit (from the past 72 hour(s)).   HEENT: normal Cardio: RRR, no murmurs Resp: CTA B/L GI: BS positive Extremity:  Warm legs, minimal edema Skin:   Intact Neuro: Flat, Abnormal Motor 0/5 LUE, 1+ L HF, 0/5 L foot/ankle , 3- Knee ext with Hip ext synergy  and Dysarthric Musc/Skel:  Normal, mild left shoulder pain with ER/IR.  Minimal edema LUE Gen NAD   Assessment/Plan: 1. Functional deficits secondary to R PLIC infarct which require 3+ hours per day of interdisciplinary therapy in a comprehensive inpatient rehab setting. Physiatrist is providing close team supervision and 24 hour management of active medical problems listed below. Physiatrist and rehab team continue to assess barriers to discharge/monitor patient progress toward functional and medical goals.  FIM: FIM - Bathing Bathing Steps Patient Completed: Chest;Left Arm;Abdomen;Front perineal area;Buttocks;Right upper leg;Left upper  leg Bathing: 3: Mod-Patient completes 5-7 75f 10 parts or 50-74%  FIM - Upper Body Dressing/Undressing Upper body dressing/undressing steps patient completed: Thread/unthread right sleeve of pullover shirt/dresss;Thread/unthread left sleeve of pullover shirt/dress;Put head through opening of pull over shirt/dress;Pull shirt over trunk Upper body dressing/undressing: 5: Set-up assist to: Obtain clothing/put away FIM - Lower Body Dressing/Undressing Lower body dressing/undressing steps patient completed: Thread/unthread right underwear leg;Thread/unthread left underwear leg;Pull underwear up/down;Thread/unthread right pants leg;Thread/unthread left pants leg;Pull pants up/down Lower body dressing/undressing: 4: Min-Patient completed 75 plus % of tasks  FIM - Toileting Toileting steps completed by patient: Adjust clothing prior to toileting;Adjust clothing after toileting Toileting Assistive Devices: Grab bar or rail for support Toileting: 4: Steadying assist  FIM - Radio producer Devices: Grab bars;Cane Toilet Transfers: 5-To toilet/BSC: Supervision (verbal cues/safety issues);5-From toilet/BSC: Supervision (verbal cues/safety issues)  FIM - Control and instrumentation engineer Devices: Cane;Arm rests Bed/Chair Transfer: 4: Chair or W/C > Bed: Min A (steadying Pt. > 75%)  FIM - Locomotion: Wheelchair Distance: 100 Locomotion: Wheelchair: 2: Travels 79 - 149 ft with supervision, cueing or coaxing FIM - Locomotion: Ambulation Locomotion: Ambulation Assistive Devices: Cane - Quad;Orthosis Ambulation/Gait Assistance: 5: Supervision;4: Min guard Locomotion: Ambulation: 2: Travels 50 - 149 ft with minimal assistance (Pt.>75%)  Comprehension Comprehension Mode: Auditory Comprehension: 7-Follows complex conversation/direction: With no assist  Expression Expression Mode: Verbal Expression: 7-Expresses complex ideas: With no assist  Social  Interaction Social Interaction Mode: Asleep Social Interaction: 7-Interacts appropriately with others - No medications needed.  Problem Solving Problem Solving Mode: Asleep Problem Solving: 6-Solves complex problems: With extra time  Memory Memory Mode: Asleep Memory: 6-More than reasonable amt of time  Medical Problem  List and Plan:  1. Functional deficits secondary to right PLIC infarcts secondary to small vessel disease  2. DVT Prophylaxis/Anticoagulation: SCDs. Monitor for any signs of DVT  3. Pain Management: Tylenol as needed, Kpad, analgesic topical ordered  -continue elevation of LUE  -  kpad to neck/ shoulder 4. Hypertension. Lopressor 12.5 mg twice a day. Monitor with increased mobility  5. Neuropsych: This patient is capable of making decisions on her own behalf. Adjusting to disability   6. Hyperlipidemia. Lipitor   LOS (Days) 21 A FACE TO FACE EVALUATION WAS PERFORMED  [name] E 09/16/2013, 7:25 AM

## 2013-09-17 ENCOUNTER — Inpatient Hospital Stay (HOSPITAL_COMMUNITY): Payer: Medicare Other | Admitting: Occupational Therapy

## 2013-09-17 ENCOUNTER — Ambulatory Visit (HOSPITAL_COMMUNITY): Payer: Medicare Other | Admitting: Physical Therapy

## 2013-09-17 ENCOUNTER — Encounter (HOSPITAL_COMMUNITY): Payer: Medicare Other | Admitting: Occupational Therapy

## 2013-09-17 ENCOUNTER — Inpatient Hospital Stay (HOSPITAL_COMMUNITY): Payer: Medicare Other

## 2013-09-17 MED ORDER — SALINE SPRAY 0.65 % NA SOLN
1.0000 | NASAL | Status: DC | PRN
  Filled 2013-09-17: qty 44

## 2013-09-17 NOTE — Progress Notes (Signed)
Social Work Patient ID: [name], female   DOB: 04-Jan-1951, 63 y.o.   MRN: [id number]  CSW met with pt and her granddaughter to update them on team conference.  Pt is pleased to be going home tomorrow.  She feels comfortable with that plan and CSW told her that we would order all necessary DME and arrange f/u with outpatient therapies.  Pt will have her family with her 24/7 to assist with AFO and left shoe and any other needs. CSW remains available to assist as needed.

## 2013-09-17 NOTE — Progress Notes (Signed)
Occupational Therapy Session Note  Patient Details  Name: [name] MRN: [id number] Date of Birth: May 16, 1950  Today's Date: 09/17/2013 Time: [phone] and [id number] Time Calculation (min): 62 min and 33 min  Short Term Goals: Week 3:  OT Short Term Goal 1 (Week 3): STG = LTGs due to remaining LOS  Skilled Therapeutic Interventions/Progress Updates:    1) Completed ADL retraining with focus on dynamic standing balance, functional transfers, and use of AE to increase independence.Pt completed bathing overall supervision at sit > stand level with use of long handled sponge.  Pt continues to require physical assist with donning Lt sock and shoe with AFO, unable to utilize sock aid due to hemiparesis and unable to don shoe with AFO.  Pt reports daughter or granddaughter can assist.  Pt completed toilet and tub/shower transfers supervision this session, recommend close supervision during all mobility and transfers.  Pt's family not present for education this session, daughter [name] has attended various sessions and plans to attend PM session.  2) Engaged in family education with daughter, [name], and granddaughter, [name] regarding functional mobility in home environment and bathroom transfers.  Pt demonstrated tub/shower transfer with supervision with use of quad cane and tub transfer bench.  Educated daughter on recommendation for supervision to min assist during shower transfer and supervision during bathing.  Discussed bathroom and bedroom setup, recommending drop arm commode for nighttime as not recommending pt ambulate without AFO.  Pt demonstrated bed mobility and squat pivot to drop arm commode from bed in ADL apartment with supervision.  Educated on retrograde massage for LUE and discussed increased awareness of placement during tasks and during rest times to decrease risk for edema.  Daughter reports no further questions, satisfied with progress made.  Therapy Documentation Precautions:   Precautions Precautions: Fall Precaution Comments: Lt hemiparesis Restrictions Weight Bearing Restrictions: No Pain: Pain Assessment Pain Assessment: No/denies pain Pain Score: 0-No pain  See FIM for current functional status  Therapy/Group: Individual Therapy  Simonne Come 09/17/2013, 11:02 AM

## 2013-09-17 NOTE — Progress Notes (Signed)
Occupational Therapy Discharge Summary  Patient Details  Name: [name] MRN: [id number] Date of Birth: 05/27/1950  Today's Date: 09/17/2013  Patient has met 11 of 11 long term goals due to improved activity tolerance, improved balance, postural control and ability to compensate for deficits.  Patient to discharge at overall Supervision level, min assist with LB dressing.  Patient's care partner is independent to provide the necessary physical assistance at discharge.    Reasons goals not met: N/A  Recommendation:  Patient will benefit from ongoing skilled OT services in outpatient setting to continue to advance functional skills in the area of BADL, iADL and Reduce care partner burden.  Equipment: tub bench and drop arm commode  Reasons for discharge: treatment goals met and discharge from hospital  Patient/family agrees with progress made and goals achieved: Yes  OT Discharge Precautions/Restrictions  Precautions Precautions: Fall Precaution Comments: Lt hemiparesis General   Vital Signs Therapy Vitals Temp: 97.7 F (36.5 C) Temp src: Oral Pulse Rate: 73 Resp: 18 BP: 112/68 mmHg Patient Position (if appropriate): Sitting Oxygen Therapy SpO2: 97 % O2 Device: None (Room air) ADL  See FIM Vision/Perception  Vision- History Baseline Vision/History: Wears glasses Wears Glasses: Reading only Patient Visual Report: No change from baseline Vision- Assessment Vision Assessment?: Yes;No apparent visual deficits  Cognition Overall Cognitive Status: Within Functional Limits for tasks assessed Arousal/Alertness: Awake/alert Orientation Level: Oriented X4 Memory: Appears intact Awareness: Appears intact Problem Solving: Appears intact Safety/Judgment: Appears intact Sensation Sensation Light Touch: Impaired by gross assessment Proprioception: Appears Intact Additional Comments: pt reports slight dullness to touch in LUE in comparison  Coordination Gross Motor  Movements are Fluid and Coordinated: No Fine Motor Movements are Fluid and Coordinated: No Finger Nose Finger Test: RUE WFL, LUE unable to assess due to hemiparesis Extremity/Trunk Assessment RUE Assessment RUE Assessment: Within Functional Limits LUE Assessment LUE Assessment: Exceptions to WFL (flaccid, pt developing shoulder flexion/extension in gravity eliminated post WB)  See FIM for current functional status  HOXIE, SARAH 09/17/2013, 3:10 PM  

## 2013-09-17 NOTE — Progress Notes (Signed)
Subjective/Complaints: 63 y.o. right handed female with history of hypertension. Patient lives alone and was independent prior to admission. Admitted 08/23/2013 with left-sided weakness of acute onset after returning home from work as well as slurred speech. MRI showed acute infarction within the right posterior limb internal capsule/corona radiata. MRA of the head with no major occlusion or stenosis. Echocardiogram with ejection fraction of 09% grade 1 diastolic dysfunction. Carotid Dopplers with no ICA stenosis. Patient did not receive TPA. Neurology consulted placed on aspirin for CVA prophylaxis as well as enrollment in the Point trial study.   Dry and clogged nose No cough or sore throat Feel some flickering in my left arm  Review of Systems -otherwise neg  Objective: Vital Signs: Blood pressure 103/67, pulse 69, temperature 97.6 F (36.4 C), temperature source Oral, resp. rate 18, height _0  (1.6 m), weight 110.814 kg (244 lb 4.8 oz), SpO2 94.00%. No results found. No results found for this or any previous visit (from the past 72 hour(s)).   HEENT: normal Cardio: RRR, no murmurs Resp: CTA B/L GI: BS positive Extremity:  Warm legs, minimal edema Skin:   Intact Neuro: Flat, Abnormal Motor 0/5 LUE, 1+ L HF, 0/5 L foot/ankle , 3- Knee ext with Hip ext synergy  and Dysarthric Musc/Skel:  Normal, mild left shoulder pain with ER/IR.  Minimal edema LUE Gen NAD   Assessment/Plan: 1. Functional deficits secondary to R PLIC infarct which require 3+ hours per day of interdisciplinary therapy in a comprehensive inpatient rehab setting. Physiatrist is providing close team supervision and 24 hour management of active medical problems listed below. Physiatrist and rehab team continue to assess barriers to discharge/monitor patient progress toward functional and medical goals. Team conference today please see physician documentation under team conference tab, met with team face-to-face to  discuss problems,progress, and goals. Formulized individual treatment plan based on medical history, underlying problem and comorbidities. FIM: FIM - Bathing Bathing Steps Patient Completed: Chest;Left Arm;Abdomen;Front perineal area;Buttocks;Right upper leg;Left upper leg;Right Arm;Right lower leg (including foot);Left lower leg (including foot) Bathing: 4: Steadying assist  FIM - Upper Body Dressing/Undressing Upper body dressing/undressing steps patient completed: Thread/unthread right sleeve of pullover shirt/dresss;Thread/unthread left sleeve of pullover shirt/dress;Put head through opening of pull over shirt/dress;Pull shirt over trunk Upper body dressing/undressing: 5: Set-up assist to: Obtain clothing/put away FIM - Lower Body Dressing/Undressing Lower body dressing/undressing steps patient completed: Thread/unthread right underwear leg;Thread/unthread left underwear leg;Pull underwear up/down;Thread/unthread right pants leg;Thread/unthread left pants leg;Pull pants up/down;Don/Doff right sock;Don/Doff right shoe Lower body dressing/undressing: 4: Min-Patient completed 75 plus % of tasks  FIM - Toileting Toileting steps completed by patient: Adjust clothing prior to toileting;Adjust clothing after toileting Toileting Assistive Devices: Grab bar or rail for support Toileting: 4: Steadying assist  FIM - Radio producer Devices: Grab bars;Cane Toilet Transfers: 5-To toilet/BSC: Supervision (verbal cues/safety issues);5-From toilet/BSC: Supervision (verbal cues/safety issues)  FIM - Control and instrumentation engineer Devices: Cane;Arm rests Bed/Chair Transfer: 5: Bed > Chair or W/C: Supervision (verbal cues/safety issues);5: Chair or W/C > Bed: Supervision (verbal cues/safety issues);5: Supine > Sit: Supervision (verbal cues/safety issues);5: Sit > Supine: Supervision (verbal cues/safety issues)  FIM - Locomotion: Wheelchair Distance:  130 Locomotion: Wheelchair: 2: Travels 50 - 149 ft with supervision, cueing or coaxing FIM - Locomotion: Ambulation Locomotion: Ambulation Assistive Devices: Cane - Quad;Orthosis (L AFO) Ambulation/Gait Assistance: 4: Min guard Locomotion: Ambulation: 4: Travels 150 ft or more with minimal assistance (Pt.>75%)  Comprehension Comprehension Mode: Auditory Comprehension: 7-Follows complex  conversation/direction: With no assist  Expression Expression Mode: Verbal Expression: 7-Expresses complex ideas: With no assist  Social Interaction Social Interaction Mode: Asleep Social Interaction: 7-Interacts appropriately with others - No medications needed.  Problem Solving Problem Solving Mode: Asleep Problem Solving: 6-Solves complex problems: With extra time  Memory Memory Mode: Asleep Memory: 6-More than reasonable amt of time  Medical Problem List and Plan:  1. Functional deficits secondary to right PLIC infarcts secondary to small vessel disease  2. DVT Prophylaxis/Anticoagulation: SCDs. Monitor for any signs of DVT  3. Pain Management: Tylenol as needed, Kpad, analgesic topical ordered  -continue elevation of LUE  -  kpad to neck/ shoulder 4. Hypertension. Lopressor 12.5 mg twice a day. Monitor with increased mobility  5. Neuropsych: This patient is capable of making decisions on her own behalf. Adjusting to disability   6. Hyperlipidemia. Lipitor 7.  Morbid obesity- dietary consult for wt loss  LOS (Days) 22 A FACE TO FACE EVALUATION WAS PERFORMED  KIRSTEINS,ANDREW E 09/17/2013, 7:35 AM

## 2013-09-17 NOTE — Progress Notes (Signed)
Social Work Patient ID: Loletta Parish, female   DOB: 17-Jul-1950, 63 y.o.   MRN: [id number]  Silvestre Mesi Nnamdi Dacus, LCSW Social Worker Signed  Patient Care Conference Service date: 09/17/2013 2:58 PM  Inpatient RehabilitationTeam Conference and Plan of Care Update Date: 09/17/2013   Time: 10:30 AM     Patient Name: [name]       Medical Record Number: [id number]   Date of Birth: 17-Nov-1950 Sex: Female         Room/Bed: 4M09C/4M09C-01 Payor Info: Payor: MEDICARE / Plan: MEDICARE PART A AND B / Product Type: *No Product type* /   Admitting Diagnosis: R CVA   Admit Date/Time:  08/26/2013  5:49 PM Admission Comments: No comment available   Primary Diagnosis:  <principal problem not specified> Principal Problem: <principal problem not specified>    Patient Active Problem List     Diagnosis  Date Noted   .  Chronic diastolic heart failure  24/40/1027   .  Dyslipidemia  08/26/2013   .  Borderline diabetes  08/26/2013   .  CVA (cerebral infarction)  08/26/2013   .  Acute CVA (cerebrovascular accident)  08/23/2013   .  Pre-syncope  08/17/2012   .  Hypokalemia  08/17/2012   .  ARTHRITIS  03/28/2010   .  DE QUERVAIN'S TENOSYNOVITIS  03/28/2010   .  Normocytic anemia  07/26/2009   .  REACTIVE AIRWAY DISEASE  03/09/2009   .  DENTAL CARIES  03/09/2009   .  CANDIDIASIS OF VULVA AND VAGINA  06/04/2008   .  SKIN TAG  06/04/2008   .  ARTHRITIS, KNEES, BILATERAL  05/22/2008   .  HYPERTENSION, BENIGN ESSENTIAL  05/04/2008   .  KNEE PAIN, BILATERAL  05/04/2008   .  COUGH  05/04/2008   .  MENISCUS TEAR, RIGHT  12/07/2005     Expected Discharge Date: Expected Discharge Date: 09/18/13  Team Members Present: Physician leading conference: Dr. Alger Simons Social Worker Present: Alfonse Alpers, LCSW;Lucy Owenton, [place] Nurse Present: Nanine Means, RN PT Present: Billie Ruddy, PT OT Present: Simonne Come, Artemio Aly, OT SLP Present: Windell Moulding, SLP PPS Coordinator present : Daiva Nakayama, RN, CRRN        Current Status/Progress  Goal  Weekly Team Focus   Medical     paricipating better in PT/OT/SLP, severe L neglect, word finding  d/c to home   Family training   Bowel/Bladder     continent of bowel and bladder w/ stress incont. LBM 09/15/13   regular BM Q1-2 days,voids QS w/ skin kept clean and dry after accidents  monitor pt daily for I&O and LBM   Swallow/Nutrition/ Hydration            ADL's     supervision grooming and UB dressing, min assist bathing and LB dressing, min - supervision transfers with quad cane  supervision overall, min assist LB dressing and tub/shower transfer  transfers, balance, LUE NMR, family education, d/c planning   Mobility     Close supervision to Min guard overall  Supervision overall  Community mobility, D/C planning, hands-on family training/education with granddaughter   Communication            Safety/Cognition/ Behavioral Observations    follows safety plan  safe mobility,d/c to safe environment,follows safety plan  educate,assess for compliance w/ safety plan   Pain     no c/o pain    monitor for pain    Skin  skin tear to L thigh ,OTA ,healing   no additional skin breakdown  assess skin,educate to turn in bed    Rehab Goals Patient on target to meet rehab goals: Yes Rehab Goals Revised: None *See Care Plan and progress notes for long and short-term goals.    Barriers to Discharge:  none     Possible Resolutions to Barriers:    set up outpt therapy , get DME      Discharge Planning/Teaching Needs:    Pt continues to do well and will be ready for d/c tomorrow.  Her family will be assisting her at home.   Family has been present for several therapy sessions and pt carries over information and exercises well.    Team Discussion:    Pt has some stress incontinence with walking to restroom, but is otherwise continent.  She has no pain.  Pt is walking great with supervision.  Pt will be at supervision for bathing and  dressing with the exception of her left shoe and AFO.  DME needed discussed.   Revisions to Treatment Plan:    None    Continued Need for Acute Rehabilitation Level of Care: The patient requires daily medical management by a physician with specialized training in physical medicine and rehabilitation for the following conditions: Daily direction of a multidisciplinary physical rehabilitation program to ensure safe treatment while eliciting the highest outcome that is of practical value to the patient.: Yes Daily medical management of patient stability for increased activity during participation in an intensive rehabilitation regime.: Yes Daily analysis of laboratory values and/or radiology reports with any subsequent need for medication adjustment of medical intervention for : Neurological problems;Other  Cecia Egge, Silvestre Mesi 09/17/2013, 2:58 PM

## 2013-09-17 NOTE — Discharge Summary (Signed)
Discharge summary job [id number]

## 2013-09-17 NOTE — Patient Care Conference (Signed)
Inpatient RehabilitationTeam Conference and Plan of Care Update Date: 09/17/2013   Time: 10:30 AM    Patient Name: [name]      Medical Record Number: [id number]  Date of Birth: 1950/07/25 Sex: Female         Room/Bed: 4M09C/4M09C-01 Payor Info: Payor: MEDICARE / Plan: MEDICARE PART A AND B / Product Type: *No Product type* /    Admitting Diagnosis: R CVA  Admit Date/Time:  08/26/2013  5:49 PM Admission Comments: No comment available   Primary Diagnosis:  <principal problem not specified> Principal Problem: <principal problem not specified>  Patient Active Problem List   Diagnosis Date Noted  . Chronic diastolic heart failure 80/99/8338  . Dyslipidemia 08/26/2013  . Borderline diabetes 08/26/2013  . CVA (cerebral infarction) 08/26/2013  . Acute CVA (cerebrovascular accident) 08/23/2013  . Pre-syncope 08/17/2012  . Hypokalemia 08/17/2012  . ARTHRITIS 03/28/2010  . DE QUERVAIN'S TENOSYNOVITIS 03/28/2010  . Normocytic anemia 07/26/2009  . REACTIVE AIRWAY DISEASE 03/09/2009  . DENTAL CARIES 03/09/2009  . CANDIDIASIS OF VULVA AND VAGINA 06/04/2008  . SKIN TAG 06/04/2008  . ARTHRITIS, KNEES, BILATERAL 05/22/2008  . HYPERTENSION, BENIGN ESSENTIAL 05/04/2008  . KNEE PAIN, BILATERAL 05/04/2008  . COUGH 05/04/2008  . MENISCUS TEAR, RIGHT 12/07/2005    Expected Discharge Date: Expected Discharge Date: 09/18/13  Team Members Present: Physician leading conference: Dr. Alger Simons Social Worker Present: Alfonse Alpers, LCSW;Lucy Stapleton, [place] Nurse Present: Nanine Means, RN PT Present: Billie Ruddy, PT OT Present: Simonne Come, Artemio Aly, OT SLP Present: Windell Moulding, SLP PPS Coordinator present : Daiva Nakayama, RN, CRRN     Current Status/Progress Goal Weekly Team Focus  Medical   paricipating better in PT/OT/SLP, severe L neglect, word finding  d/c to home   Family training   Bowel/Bladder   continent of bowel and bladder w/ stress incont. LBM 09/15/13   regular  BM Q1-2 days,voids QS w/ skin kept clean and dry after accidents  monitor pt daily for I&O and LBM   Swallow/Nutrition/ Hydration             ADL's   supervision grooming and UB dressing, min assist bathing and LB dressing, min - supervision transfers with quad cane  supervision overall, min assist LB dressing and tub/shower transfer  transfers, balance, LUE NMR, family education, d/c planning   Mobility   Close supervision to Min guard overall  Supervision overall  Community mobility, D/C planning, hands-on family training/education with granddaughter   Communication             Safety/Cognition/ Behavioral Observations  follows safety plan  safe mobility,d/c to safe environment,follows safety plan  educate,assess for compliance w/ safety plan   Pain   no c/o pain     monitor for pain    Skin   skin tear to L thigh ,OTA ,healing   no additional skin breakdown  assess skin,educate to turn in bed    Rehab Goals Patient on target to meet rehab goals: Yes Rehab Goals Revised: None *See Care Plan and progress notes for long and short-term goals.  Barriers to Discharge: none    Possible Resolutions to Barriers:  set up outpt therapy , get DME    Discharge Planning/Teaching Needs:  Pt continues to do well and will be ready for d/c tomorrow.  Her family will be assisting her at home.  Family has been present for several therapy sessions and pt carries over information and exercises well.   Team Discussion:  Pt has some stress incontinence with walking to restroom, but is otherwise continent.  She has no pain.  Pt is walking great with supervision.  Pt will be at supervision for bathing and dressing with the exception of her left shoe and AFO.  DME needed discussed.  Revisions to Treatment Plan:  None   Continued Need for Acute Rehabilitation Level of Care: The patient requires daily medical management by a physician with specialized training in physical medicine and rehabilitation  for the following conditions: Daily direction of a multidisciplinary physical rehabilitation program to ensure safe treatment while eliciting the highest outcome that is of practical value to the patient.: Yes Daily medical management of patient stability for increased activity during participation in an intensive rehabilitation regime.: Yes Daily analysis of laboratory values and/or radiology reports with any subsequent need for medication adjustment of medical intervention for : Neurological problems;Other  Shervon Kerwin, Silvestre Mesi 09/17/2013, 2:58 PM

## 2013-09-17 NOTE — Discharge Summary (Signed)
[name], [name]                  ACCOUNT NO.:  [id number]  MEDICAL RECORD NO.:  [id number]  LOCATION:  4M09C                        FACILITY:  [facility]  PHYSICIAN:  Charlett Blake, M.D.DATE OF BIRTH:  10-Dec-1950  DATE OF ADMISSION:  08/26/2013 DATE OF DISCHARGE:  09/18/2013                              DISCHARGE SUMMARY   DISCHARGE DIAGNOSES: 1. Functional deficit secondary to right posterior limb of the     internal capsule infarction secondary to small vessel disease. 2. Sequential compression devices for deep venous thrombosis     prophylaxis. 3. Hypertension. 4. Hyperlipidemia.  HISTORY OF PRESENT ILLNESS:  This is a 63 year old right-handed female history of hypertension, who lives alone independent prior to admission. Admitted on August 23, 2013, with left-sided weakness of acute onset after returning home from work as well as slurred speech.  MRI of the head showed acute infarction within the right posterior limb internal capsule corona radiata.  MRA of the head with no major occlusion or stenosis. Echocardiogram with ejection fraction 65% and grade 1 diastolic dysfunction.  Carotid Dopplers with no ICA stenosis.  The patient did not receive tPA.  Neurology consulted, maintained on aspirin for CVA prophylaxis as well as enrollment in the point trial study.  Tolerating a regular diet.  Physical and occupational therapy ongoing.  The patient was admitted for comprehensive rehab program.  PAST MEDICAL HISTORY:  See discharge diagnoses.  SOCIAL HISTORY:  Lives alone.  FUNCTIONAL HISTORY:  Prior to admission independent.  Functional status upon admission to [facility] was moderate assist, sit to stand; moderate assist, stand pivot transfers; max assist, ambulation 8 feet; min mod assist, activities of daily living.  PHYSICAL EXAMINATION:  VITAL SIGNS:  Blood pressure 121/65, pulse 71, temperature 98.5, respirations 18. GENERAL:  This was an alert, female, no acute  distress.  Speech was dysarthric, but intelligible oriented to person, place, date of birth, followed simple commands. LUNGS:  Clear to auscultation. CARDIAC:  Regular rate and rhythm. ABDOMEN:  Soft, nontender.  Good bowel sounds.  REHABILITATION HOSPITAL COURSE:  Patient was admitted to [facility] with therapies initiated on a 3-hour daily basis consisting of physical therapy, occupational therapy, and rehabilitation nursing.  The following issues were addressed during patient's rehabilitation stay. Pertaining to [name]'s right PLIC CVA secondary to small vessel disease remained stable, maintained on aspirin therapy.  She would follow up with Neurology Services.  She had been enrolled in the point study program.  Blood pressures well controlled on Lopressor, she would follow up with her primary MD.  She was maintained on Lipitor for hyperlipidemia.  No bowel or bladder disturbances.  She was tolerating a regular diet.  The patient received weekly collaborative interdisciplinary team conferences to discuss estimated length of stay, family teaching, and any barriers to discharge.  She was propelling her wheelchair in a controlled home environment with right hemi-technique and supervision.  She had been fitted with a left AFO brace.  She was ambulating 156 feet in a controlled environment using AFO brace and a large base quad cane.  She was able to negotiate sidewalks sloped downwards.  She was able to ambulate to the  gift shop, activities of daily living, completed sessions on bathing, dressing, grooming demonstrated good carry over and safety techniques.  Full family teaching was completed.  Plan was to be discharged to home with home health physical and occupational therapy.  DISCHARGE MEDICATIONS:  Included aspirin 81 mg p.o. daily, Lipitor 40 mg p.o. daily, Lopressor 12.5 mg p.o. b.i.d., placebo point study as per Neurology Services.  DIET:  Regular.  SPECIAL  INSTRUCTIONS:  The patient would follow up with Dr. Alysia Penna at the [facility] on October 27, 2013; Dr. Erlinda Hong of Neurology Services 1 month call for appointment; [name], M.D. medical management home health physical and occupational therapies arranged.     Lauraine Rinne, P.A.   ______________________________ Charlett Blake, M.D.    DA/MEDQ  D:  09/17/2013  T:  09/17/2013  Job:  [id number]  cc:   Dr. Eustace Moore, M.D.

## 2013-09-17 NOTE — Discharge Instructions (Signed)
Inpatient Rehab Discharge Instructions  [name] Discharge date and time: No discharge date for patient encounter.   Activities/Precautions/ Functional Status: Activity: activity as tolerated Diet: regular diet Wound Care: none needed Functional status:  ___ No restrictions     ___ Walk up steps independently ___ 24/7 supervision/assistance   ___ Walk up steps with assistance ___ Intermittent supervision/assistance  ___ Bathe/dress independently ___ Walk with walker     ___ Bathe/dress with assistance ___ Walk Independently    ___ Shower independently _x__ Walk with assistance    ___ Shower with assistance ___ No alcohol     ___ Return to work/school ________    COMMUNITY REFERRALS UPON DISCHARGE:    Outpatient: PT     OT                   Agency: [facility]      Phone: [phone]               Appointment Date/Time: 8/14 @ 8:45 - 9:30 (arrive @ 8:15 am)                                                                       and 11:00 - 11:45 am  Medical Equipment/Items Ordered: wheelchair, quad cane, drop arm comode and tub bench                                                    Agency/Supplier: [facility] @ [phone]  GENERAL COMMUNITY RESOURCES FOR PATIENT/FAMILY:  Support Groups: Stroke Support Group      Special Instructions:    My questions have been answered and I understand these instructions. I will adhere to these goals and the provided educational materials after my discharge from the hospital.  Patient/Caregiver Signature _______________________________ Date __________  Clinician Signature _______________________________________ Date __________  Please bring this form and your medication list with you to all your follow-up doctor's appointments.  STROKE/TIA DISCHARGE INSTRUCTIONS SMOKING Cigarette smoking nearly doubles your risk of having a stroke & is the single most alterable risk factor  If you smoke or have smoked in the last 12  months, you are advised to quit smoking for your health.  Most of the excess cardiovascular risk related to smoking disappears within a year of stopping.  Ask you doctor about anti-smoking medications  [facility] Quit Line: 1-800-QUIT NOW  Free Smoking Cessation Classes [phone]  CHOLESTEROL Know your levels; limit fat & cholesterol in your diet  Lipid Panel     Component Value Date/Time   CHOL 168 08/24/2013 0226   TRIG 139 08/24/2013 0226   HDL 34* 08/24/2013 0226   CHOLHDL 4.9 08/24/2013 0226   VLDL 28 08/24/2013 0226   LDLCALC 106* 08/24/2013 0226      Many patients benefit from treatment even if their cholesterol is at goal.  Goal: Total Cholesterol (CHOL) less than 160  Goal:  Triglycerides (TRIG) less than 150  Goal:  HDL greater than 40  Goal:  LDL (LDLCALC) less than 100   BLOOD PRESSURE American Stroke Association blood  pressure target is less that 120/80 mm/Hg  Your discharge blood pressure is:  BP: 105/65 mmHg  Monitor your blood pressure  Limit your salt and alcohol intake  Many individuals will require more than one medication for high blood pressure  DIABETES (A1c is a blood sugar average for last 3 months) Goal HGBA1c is under 7% (HBGA1c is blood sugar average for last 3 months)  Diabetes: No known diagnosis of diabetes    Lab Results  Component Value Date   HGBA1C 6.1* 08/24/2013     Your HGBA1c can be lowered with medications, healthy diet, and exercise.  Check your blood sugar as directed by your physician  Call your physician if you experience unexplained or low blood sugars.  PHYSICAL ACTIVITY/REHABILITATION Goal is 30 minutes at least 4 days per week  Activity: Increase activity slowly, Therapies: Physical Therapy: Home Health Return to work:   Activity decreases your risk of heart attack and stroke and makes your heart stronger.  It helps control your weight and blood pressure; helps you relax and can improve your mood.  Participate in a regular  exercise program.  Talk with your doctor about the best form of exercise for you (dancing, walking, swimming, cycling).  DIET/WEIGHT Goal is to maintain a healthy weight  Your discharge diet is: Cardiac  liquids Your height is:  Height: 5\' 3"  (160 cm) Your current weight is: Weight: 110.6 kg (243 lb 13.3 oz) Your Body Mass Index (BMI) is:  BMI (Calculated): 43.3  Following the type of diet specifically designed for you will help prevent another stroke.  Your goal weight range is:    Your goal Body Mass Index (BMI) is 19-24.  Healthy food habits can help reduce 3 risk factors for stroke:  High cholesterol, hypertension, and excess weight.  RESOURCES Stroke/Support Group:  Call [phone]   STROKE EDUCATION PROVIDED/REVIEWED AND GIVEN TO PATIENT Stroke warning signs and symptoms How to activate emergency medical system (call 911). Medications prescribed at discharge. Need for follow-up after discharge. Personal risk factors for stroke. Pneumonia vaccine given:  Flu vaccine given:  My questions have been answered, the writing is legible, and I understand these instructions.  I will adhere to these goals & educational materials that have been provided to me after my discharge from the hospital.

## 2013-09-17 NOTE — Progress Notes (Signed)
Physical Therapy Session Note  Patient Details  Name: [name] MRN: [id number] Date of Birth: 1950-11-15  Today's Date: 09/17/2013 Time: 1520-1550 Time Calculation (min): 30 min  Short Term Goals: Week 1:  PT Short Term Goal 1 (Week 1): Pt will perform supine>sit with with HOB flat using rail requiring min A and 25% cueing for sequencing. PT Short Term Goal 1 - Progress (Week 1): Partly met PT Short Term Goal 2 (Week 1): Pt will perform sit>supine with HOB flat requiring min A and 25% cueing for sequencing. PT Short Term Goal 2 - Progress (Week 1): Partly met PT Short Term Goal 3 (Week 1): Pt will perform bed<>chair transfer with max A and 50% cueing for technique and sequencing. PT Short Term Goal 3 - Progress (Week 1): Met PT Short Term Goal 4 (Week 1): Pt will perform w/c mobility x150' in controlled environment with supervision and 25% cueing for technique. PT Short Term Goal 4 - Progress (Week 1): Met PT Short Term Goal 5 (Week 1): Pt will perform gait x50' in controlled environment with Total A of single therapist and 75% cueing. PT Short Term Goal 5 - Progress (Week 1): Met Week 2:  PT Short Term Goal 1 (Week 2): Pt will consistently perform supine>sit with supervision and 25% cueing for technique. PT Short Term Goal 1 - Progress (Week 2): Met PT Short Term Goal 2 (Week 2): Pt will consistently perform sit>supine with supervision and 25% cueing for technique. PT Short Term Goal 2 - Progress (Week 2): Met PT Short Term Goal 3 (Week 2): Pt will perform sit>stand with supervision and 25% cueing for setup. PT Short Term Goal 3 - Progress (Week 2): Met PT Short Term Goal 4 (Week 2): Pt will perform dynamic standing balance x1 minute without UE support with min A. PT Short Term Goal 4 - Progress (Week 2): Met PT Short Term Goal 5 (Week 2): Pt will perform gait x150' in controlled environment with LRAD and min guard. PT Short Term Goal 5 - Progress (Week 2): Met Week 3:  PT Short  Term Goal 1 (Week 3): STG's=LTG's secondary to anticipated LOS.  Skilled Therapeutic Interventions/Progress Updates:  Pt. Received seated in w/c and agreeable to therapy session.  1:1 Treatment focused on discharge data collection for functional levels. Pt. Assist level is mod -I with LBQC. Balance, strength, w/c mgmt, and related data points were addressed and in place to facilitate d/c by physical therapist. Pt. Performed functional transfers, dynamic balance, postural control and maintaining a stable BOS.  Pain: no c/o pain.  Pt. Condition post therapy session: positioned in recliner with all needs within reach.   Therapy Documentation Precautions:  Precautions Precautions: Fall Precaution Comments: Lt hemiparesis Restrictions Weight Bearing Restrictions: No General:   Vital Signs: Therapy Vitals Temp: 97.7 F (36.5 C) Temp src: Oral Pulse Rate: 73 Resp: 18 BP: 112/68 mmHg Patient Position (if appropriate): Sitting Oxygen Therapy SpO2: 97 % O2 Device: None (Room air) Pain:   Mobility: Transfers Transfers: Yes Sit to Stand: 6: Modified independent (Device/Increase time) Stand to Sit: 6: Modified independent (Device/Increase time) Lateral/Scoot Transfers: 6: Modified independent (Device/Increase time) Locomotion : Ambulation Ambulation: Yes Ambulation/Gait Assistance: 6: Modified independent (Device/Increase time) Ambulation Distance (Feet): 15 Feet Assistive device: Large base quad cane Ambulation/Gait Assistance Details: Pt. mod-i with LBQC for controled surfaces, obstacles: navigating and step-over, demonstrates pre-planning and uses proper balance strategies Gait Gait: Yes Gait Pattern: Impaired Gait Pattern: Step-to pattern;Decreased step length - right;Decreased  stance time - left;Trunk flexed;Decreased weight shift to left Gait velocity: decreased Wheelchair Mobility Wheelchair Mobility: Yes Wheelchair Assistance: 6: Modified independent  (Device/Increase time) Environmental health practitioner: Right upper extremity;Right lower extremity Wheelchair Parts Management: Independent Distance: 200  Trunk/Postural Assessment : Cervical Assessment Cervical Assessment: Exceptions to [facility] Thoracic Assessment Thoracic Assessment: Exceptions to [facility] Lumbar Assessment Lumbar Assessment: Exceptions to [facility] Postural Control Postural Control: Within Functional Limits  Balance: Balance Balance Assessed: Yes Dynamic Sitting Balance Dynamic Sitting - Balance Support: Feet supported Dynamic Sitting - Level of Assistance: 6: Modified independent (Device/Increase time) Static Standing Balance Static Standing - Balance Support: No upper extremity supported Static Standing - Level of Assistance: 6: Modified independent (Device/Increase time) Static Standing - Comment/# of Minutes: 5 minutes with distractions Exercises:   Other Treatments:    See FIM for current functional status  Therapy/Group: Individual Therapy  Juluis Mire 09/17/2013, 4:28 PM

## 2013-09-17 NOTE — Progress Notes (Signed)
Physical Therapy Discharge Summary  Patient Details  Name: [name] MRN: [id number] Date of Birth: Apr 29, 1950  Today's Date: 09/19/2013 Time: 0932-1028 Time Calculation (min): 56 min  Patient has met 14 of 14 long term goals due to improved activity tolerance, improved balance, improved postural control, increased strength, ability to compensate for deficits and functional use of  left lower extremity.  Patient to discharge at an ambulatory level Supervision.   Patient's care partner is independent to provide the necessary physical assistance at discharge.  Reasons goals not met: N/A; all goals met or surpassed.  Recommendation:  Patient will benefit from ongoing skilled PT services in outpatient setting to continue to advance safe functional mobility, address ongoing impairments in stability and independence with functional mobility, and minimize fall risk.  Equipment: ultra hemi height w/c (20"x18") and cushion, large-base quad cane, left ankle-foot orthotic, and left toe cap  Reasons for discharge: treatment goals met and discharge from hospital  Patient/family agrees with progress made and goals achieved: Yes  Skilled Therapeutic Interventions/Progress Pt received seated in w/c wearing L AFO; agreeable to therapy. Session focused on family education with granddaughter. D/C evaluation performed; see below for detailed description of findings. Therapist explained, demonstrated supervision, assist, and cueing with the following: gait x150' in controlled, home, and community environments with [facility] [address] and supervision; negotiation of single curb/step without rails using LBQC requiring min A to ascend and min guard to descend; negotiation of 3 stairs with single rail and step-to pattern requiring supervision; car transfer with [facility] and supervision, cueing for scooting backward prior to managing LLE using RLE. Granddaughter, Caryl Pina, gave effective return demonstration of appropriate cueing and  safe supervision/assist. Pt, granddaughter, and therapist had interactive discussion regarding funcitonal expectations at D/C. Reiterated recommendation for 24-hour supervision. Pt/granddaughter verbalized understanding. Session ended in pt room, where pt was left seated in w/c with granddaughter present and all needs within reach.  PT Discharge Precautions/Restrictions  Fall Vital Signs Therapy Vitals Temp: 98 F (36.7 C) Temp src: Oral Pulse Rate: 71 Resp: 18 BP: 113/62 mmHg Patient Position (if appropriate): Lying Oxygen Therapy SpO2: 97 % O2 Device: None (Room air) Pain Pain Assessment Pain Assessment: No/denies pain Pain Score: 0-No pain Vision/Perception     Cognition Overall Cognitive Status: Within Functional Limits for tasks assessed Arousal/Alertness: Awake/alert Orientation Level: Oriented X4 Memory: Appears intact Awareness: Appears intact Problem Solving: Appears intact Safety/Judgment: Appears intact Sensation Sensation Light Touch: Impaired by gross assessment Proprioception: Appears Intact Additional Comments: pt reports slight dullness to touch in LUE and LLE in comparison  Coordination Gross Motor Movements are Fluid and Coordinated: No (L side hemi) Fine Motor Movements are Fluid and Coordinated: No (L side hemi) Finger Nose Finger Test: RUE WFL, LUE unable to assess due to hemiparesis Motor  Motor Motor: Hemiplegia Motor - Discharge Observations: L hemiplegia; abnormal tone in L ankle plantarflexors  Mobility Bed Mobility Bed Mobility: Supine to Sit;Sit to Supine;Sitting - Scoot to Edge of Bed Supine to Sit: 6: Modified independent (Device/Increase time);HOB flat Sitting - Scoot to Edge of Bed: 6: Modified independent (Device/Increase time) Sit to Supine: 6: Modified independent (Device/Increase time);HOB flat Transfers Transfers: Yes Sit to Stand: 5: Supervision Stand to Sit: 5: Supervision Squat Pivot Transfers: 5: Supervision Locomotion   Ambulation Ambulation: Yes Ambulation/Gait Assistance: 5: Supervision Ambulation Distance (Feet): 161 Feet Assistive device: Large base quad cane Ambulation/Gait Assistance Details: Pt performed gait x161' in controlled, home, and community environments with [facility], L AFO, and supervision;no overt LOB. Gait  Gait: Yes Gait Pattern: Impaired Gait Pattern: Decreased step length - right;Decreased stance time - left;Decreased weight shift to left;Trunk rotated posteriorly on left;Step-through pattern Gait velocity: Self-selected gait speed = .31 m/s. High Level Ambulation High Level Ambulation: Side stepping Side Stepping: with LBQC, L AFO requiring supervision Stairs / Additional Locomotion Stairs: Yes Stairs Assistance: 5: Supervision Stair Management Technique: One rail Right;Forwards;Step to pattern Number of Stairs: 12 Ramp: 5: Supervision;Other (comment) (with LBQC and L AFO) Curb: 4: Min assist;Other (comment) (with LBQC and L AFO) Wheelchair Mobility Wheelchair Mobility: Yes Wheelchair Assistance: 5: Supervision Wheelchair Propulsion: Right upper extremity;Right lower extremity Wheelchair Parts Management: Needs assistance;Other (comment) (Pt requires assist to manage L w/c leg rest) Distance: 200  Trunk/Postural Assessment  Cervical Assessment Cervical Assessment: Within Functional Limits (forward head) Thoracic Assessment Thoracic Assessment: Exceptions to [facility] (thoracic kyphosis) Lumbar Assessment Lumbar Assessment: Exceptions to [facility] (posterior pelvic tilt) Postural Control Postural Control: Within Functional Limits  Balance Balance Balance Assessed: Yes Dynamic Sitting Balance Dynamic Sitting - Balance Support: Feet supported Dynamic Sitting - Level of Assistance: 6: Modified independent (Device/Increase time) Static Standing Balance Static Standing - Balance Support: No upper extremity supported Static Standing - Level of Assistance: 6: Modified independent  (Device/Increase time) Static Standing - Comment/# of Minutes: 5 minutes Extremity Assessment  RLE Assessment RLE Assessment: Within Functional Limits LLE Assessment LLE Assessment: Exceptions to [facility] LLE Strength LLE Overall Strength: Deficits Left Hip Flexion: 2/5 Left Knee Flexion: 3/5 Left Knee Extension: 3/5 Left Ankle Dorsiflexion: 0/5 Left Ankle Plantar Flexion: 1/5  See FIM for current functional status  Yajayra Feldt, Malva Cogan 09/19/2013, 7:39 PM

## 2013-09-18 DIAGNOSIS — I6330 Cerebral infarction due to thrombosis of unspecified cerebral artery: Secondary | ICD-10-CM

## 2013-09-18 DIAGNOSIS — Z5189 Encounter for other specified aftercare: Secondary | ICD-10-CM

## 2013-09-18 DIAGNOSIS — I10 Essential (primary) hypertension: Secondary | ICD-10-CM

## 2013-09-18 MED ORDER — STUDY - INVESTIGATIONAL DRUG SIMPLE RECORD: 75.0000 mg | Freq: Every day | Status: DC

## 2013-09-18 MED ORDER — METOPROLOL TARTRATE 12.5 MG HALF TABLET: 12.5000 mg | ORAL_TABLET | Freq: Two times a day (BID) | ORAL | Status: DC

## 2013-09-18 MED ORDER — ATORVASTATIN CALCIUM 40 MG PO TABS: 40.0000 mg | ORAL_TABLET | Freq: Every day | ORAL | Status: AC

## 2013-09-18 MED ORDER — ASPIRIN 81 MG PO TBEC: 81.0000 mg | DELAYED_RELEASE_TABLET | Freq: Every day | ORAL | Status: DC

## 2013-09-18 NOTE — Progress Notes (Signed)
Subjective/Complaints: 63 y.o. right handed female with history of hypertension. Patient lives alone and was independent prior to admission. Admitted 08/23/2013 with left-sided weakness of acute onset after returning home from work as well as slurred speech. MRI showed acute infarction within the right posterior limb internal capsule/corona radiata. MRA of the head with no major occlusion or stenosis. Echocardiogram with ejection fraction of 02% grade 1 diastolic dysfunction. Carotid Dopplers with no ICA stenosis. Patient did not receive TPA. Neurology consulted placed on aspirin for CVA prophylaxis as well as enrollment in the Point trial study.   Pt without new issues overnite, excited about D/C  Review of Systems -otherwise neg  Objective: Vital Signs: Blood pressure 113/62, pulse 71, temperature 98 F (36.7 C), temperature source Oral, resp. rate 18, height 5\' 3"  (1.6 m), weight 110.814 kg (244 lb 4.8 oz), SpO2 97.00%. No results found. No results found for this or any previous visit (from the past 72 hour(s)).   HEENT: normal Cardio: RRR, no murmurs Resp: CTA B/L GI: BS positive Extremity:  Warm legs, minimal edema Skin:   Intact Neuro: Flat, Abnormal Motor 0/5 LUE, 1+ L HF, 0/5 L foot/ankle , 3- Knee ext with Hip ext synergy  and Dysarthric Musc/Skel:  Normal, mild left shoulder pain with ER/IR.  Minimal edema LUE Gen NAD   Assessment/Plan: 1. Functional deficits secondary to R PLIC infarct  Stable for D/C today F/u PCP in 1-2 weeks F/u PM&R 3 weeks See D/C summary See D/C instructions  FIM: FIM - Bathing Bathing Steps Patient Completed: Chest;Left Arm;Abdomen;Front perineal area;Buttocks;Right upper leg;Left upper leg;Right Arm;Right lower leg (including foot);Left lower leg (including foot) Bathing: 5: Supervision: Safety issues/verbal cues  FIM - Upper Body Dressing/Undressing Upper body dressing/undressing steps patient completed: Thread/unthread right sleeve of  pullover shirt/dresss;Thread/unthread left sleeve of pullover shirt/dress;Put head through opening of pull over shirt/dress;Pull shirt over trunk Upper body dressing/undressing: 5: Set-up assist to: Obtain clothing/put away FIM - Lower Body Dressing/Undressing Lower body dressing/undressing steps patient completed: Thread/unthread right underwear leg;Thread/unthread left underwear leg;Pull underwear up/down;Thread/unthread right pants leg;Thread/unthread left pants leg;Pull pants up/down;Don/Doff right sock;Don/Doff right shoe Lower body dressing/undressing: 4: Min-Patient completed 75 plus % of tasks  FIM - Toileting Toileting steps completed by patient: Adjust clothing prior to toileting;Performs perineal hygiene;Adjust clothing after toileting Toileting Assistive Devices: Grab bar or rail for support Toileting: 4: Steadying assist  FIM - Radio producer Devices: Grab bars;Cane Toilet Transfers: 5-To toilet/BSC: Supervision (verbal cues/safety issues);5-From toilet/BSC: Supervision (verbal cues/safety issues)  FIM - Control and instrumentation engineer Devices: Cane;Arm rests Bed/Chair Transfer: 5: Bed > Chair or W/C: Supervision (verbal cues/safety issues);5: Chair or W/C > Bed: Supervision (verbal cues/safety issues);6: Supine > Sit: No assist;6: Sit > Supine: No assist  FIM - Locomotion: Wheelchair Distance: 200 Locomotion: Wheelchair: 5: Travels 150 ft or more: maneuvers on rugs and over door sills with supervision, cueing or coaxing FIM - Locomotion: Ambulation Locomotion: Ambulation Assistive Devices: Cane - Quad;Orthosis (L AFO) Ambulation/Gait Assistance: 6: Modified independent (Device/Increase time) Locomotion: Ambulation: 5: Travels 150 ft or more with supervision/safety issues  Comprehension Comprehension Mode: Auditory Comprehension: 7-Follows complex conversation/direction: With no assist  Expression Expression Mode:  Verbal Expression: 7-Expresses complex ideas: With no assist  Social Interaction Social Interaction Mode: Asleep Social Interaction: 7-Interacts appropriately with others - No medications needed.  Problem Solving Problem Solving Mode: Asleep Problem Solving: 6-Solves complex problems: With extra time  Memory Memory Mode: Asleep Memory: 6-More than reasonable amt of  time  Medical Problem List and Plan:  1. Functional deficits secondary to right PLIC infarcts secondary to small vessel disease  2. DVT Prophylaxis/Anticoagulation: SCDs. Monitor for any signs of DVT  3. Pain Management: Tylenol as needed, Kpad, analgesic topical ordered  -continue elevation of LUE  -  kpad to neck/ shoulder 4. Hypertension. Lopressor 12.5 mg twice a day. Monitor with increased mobility  5. Neuropsych: This patient is capable of making decisions on her own behalf. Adjusting to disability   6. Hyperlipidemia. Lipitor 7.  Morbid obesity- dietary consult for wt loss  LOS (Days) 23 A FACE TO FACE EVALUATION WAS PERFORMED  Brier Firebaugh E 09/18/2013, 7:34 AM

## 2013-09-18 NOTE — Progress Notes (Signed)
Ready to go however BSC missing; waited for delivery. Taken down to car via wheelchair by NT. Instructions in bag withother equipment. No questions noted. Margarito Liner

## 2013-09-18 NOTE — Progress Notes (Signed)
Discharged to home accompanied by dtr. Discharge instructions given to pt and study drug given to pt for discharge. Equipment reviewed with patient. States an understanding of discharge information. No questions noted. [name]

## 2013-09-19 ENCOUNTER — Ambulatory Visit: Payer: Medicare Other | Admitting: Physical Therapy

## 2013-09-19 ENCOUNTER — Ambulatory Visit: Payer: Medicare Other | Attending: Physical Medicine & Rehabilitation | Admitting: Occupational Therapy

## 2013-09-19 DIAGNOSIS — M6281 Muscle weakness (generalized): Secondary | ICD-10-CM | POA: Insufficient documentation

## 2013-09-19 DIAGNOSIS — R5381 Other malaise: Secondary | ICD-10-CM | POA: Insufficient documentation

## 2013-09-19 DIAGNOSIS — IMO0001 Reserved for inherently not codable concepts without codable children: Secondary | ICD-10-CM | POA: Diagnosis not present

## 2013-09-19 DIAGNOSIS — R269 Unspecified abnormalities of gait and mobility: Secondary | ICD-10-CM | POA: Diagnosis not present

## 2013-09-19 DIAGNOSIS — I69959 Hemiplegia and hemiparesis following unspecified cerebrovascular disease affecting unspecified side: Secondary | ICD-10-CM | POA: Diagnosis not present

## 2013-09-19 NOTE — Progress Notes (Signed)
Social Work  Discharge Note  The overall goal for the admission was met for:   Discharge location: Yes - home with family providing 24/7 assistance  Length of Stay: Yes - 23 days  Discharge activity level: Yes - supervision overall  Home/community participation: Yes  Services provided included: MD, RD, PT, OT, SLP, RN, TR, Pharmacy and SW  Financial Services: Medicare  Follow-up services arranged: Outpatient: PT, OT via [facility], DME: 20x18 ultra hemi lightweight w/c, cushion, LBQC, drop arm commode and tub bench via [facility] and Patient/Family has no preference for HH/DME agencies  Comments (or additional information):  Patient/Family verbalized understanding of follow-up arrangements: Yes  Individual responsible for coordination of the follow-up plan: patient  Confirmed correct DME delivered: Fredderick Swanger 09/19/2013    Nadiyah Zeis

## 2013-09-19 NOTE — Plan of Care (Signed)
Problem: RH Balance Goal: LTG Patient will maintain dynamic standing balance (PT) LTG: Patient will maintain dynamic standing balance with assistance during mobility activities (PT)  Outcome: Completed/Met Date Met:  09/19/13 Pt surpassed goal on discharge evaluation.

## 2013-09-29 ENCOUNTER — Ambulatory Visit: Payer: Medicare Other | Admitting: Physical Therapy

## 2013-09-29 ENCOUNTER — Ambulatory Visit: Payer: Medicare Other | Admitting: Occupational Therapy

## 2013-09-29 DIAGNOSIS — M6281 Muscle weakness (generalized): Secondary | ICD-10-CM | POA: Diagnosis not present

## 2013-09-29 DIAGNOSIS — I69959 Hemiplegia and hemiparesis following unspecified cerebrovascular disease affecting unspecified side: Secondary | ICD-10-CM | POA: Diagnosis not present

## 2013-09-29 DIAGNOSIS — IMO0001 Reserved for inherently not codable concepts without codable children: Secondary | ICD-10-CM | POA: Diagnosis not present

## 2013-09-29 DIAGNOSIS — R269 Unspecified abnormalities of gait and mobility: Secondary | ICD-10-CM | POA: Diagnosis not present

## 2013-09-29 DIAGNOSIS — R5381 Other malaise: Secondary | ICD-10-CM | POA: Diagnosis not present

## 2013-10-01 ENCOUNTER — Ambulatory Visit: Payer: Medicare Other | Admitting: Occupational Therapy

## 2013-10-01 ENCOUNTER — Ambulatory Visit: Payer: Medicare Other

## 2013-10-01 DIAGNOSIS — I69959 Hemiplegia and hemiparesis following unspecified cerebrovascular disease affecting unspecified side: Secondary | ICD-10-CM | POA: Diagnosis not present

## 2013-10-01 DIAGNOSIS — M6281 Muscle weakness (generalized): Secondary | ICD-10-CM | POA: Diagnosis not present

## 2013-10-01 DIAGNOSIS — IMO0001 Reserved for inherently not codable concepts without codable children: Secondary | ICD-10-CM | POA: Diagnosis not present

## 2013-10-01 DIAGNOSIS — R5381 Other malaise: Secondary | ICD-10-CM | POA: Diagnosis not present

## 2013-10-01 DIAGNOSIS — R269 Unspecified abnormalities of gait and mobility: Secondary | ICD-10-CM | POA: Diagnosis not present

## 2013-10-06 ENCOUNTER — Ambulatory Visit: Payer: Medicare Other | Admitting: Occupational Therapy

## 2013-10-06 ENCOUNTER — Ambulatory Visit: Payer: Medicare Other

## 2013-10-06 DIAGNOSIS — R5381 Other malaise: Secondary | ICD-10-CM | POA: Diagnosis not present

## 2013-10-06 DIAGNOSIS — R269 Unspecified abnormalities of gait and mobility: Secondary | ICD-10-CM | POA: Diagnosis not present

## 2013-10-06 DIAGNOSIS — I69959 Hemiplegia and hemiparesis following unspecified cerebrovascular disease affecting unspecified side: Secondary | ICD-10-CM | POA: Diagnosis not present

## 2013-10-06 DIAGNOSIS — M6281 Muscle weakness (generalized): Secondary | ICD-10-CM | POA: Diagnosis not present

## 2013-10-06 DIAGNOSIS — IMO0001 Reserved for inherently not codable concepts without codable children: Secondary | ICD-10-CM | POA: Diagnosis not present

## 2013-10-08 ENCOUNTER — Ambulatory Visit
Payer: No Typology Code available for payment source | Attending: Physical Medicine & Rehabilitation | Admitting: Physical Therapy

## 2013-10-08 ENCOUNTER — Ambulatory Visit: Payer: No Typology Code available for payment source | Admitting: Occupational Therapy

## 2013-10-08 DIAGNOSIS — R5381 Other malaise: Secondary | ICD-10-CM | POA: Diagnosis not present

## 2013-10-08 DIAGNOSIS — M6281 Muscle weakness (generalized): Secondary | ICD-10-CM | POA: Insufficient documentation

## 2013-10-08 DIAGNOSIS — R269 Unspecified abnormalities of gait and mobility: Secondary | ICD-10-CM | POA: Insufficient documentation

## 2013-10-08 DIAGNOSIS — IMO0001 Reserved for inherently not codable concepts without codable children: Secondary | ICD-10-CM | POA: Insufficient documentation

## 2013-10-08 DIAGNOSIS — I69959 Hemiplegia and hemiparesis following unspecified cerebrovascular disease affecting unspecified side: Secondary | ICD-10-CM | POA: Diagnosis not present

## 2013-10-09 DIAGNOSIS — I6350 Cerebral infarction due to unspecified occlusion or stenosis of unspecified cerebral artery: Secondary | ICD-10-CM | POA: Diagnosis not present

## 2013-10-09 DIAGNOSIS — R5381 Other malaise: Secondary | ICD-10-CM | POA: Diagnosis not present

## 2013-10-09 DIAGNOSIS — M6281 Muscle weakness (generalized): Secondary | ICD-10-CM | POA: Diagnosis not present

## 2013-10-09 DIAGNOSIS — IMO0001 Reserved for inherently not codable concepts without codable children: Secondary | ICD-10-CM | POA: Diagnosis present

## 2013-10-09 DIAGNOSIS — I69959 Hemiplegia and hemiparesis following unspecified cerebrovascular disease affecting unspecified side: Secondary | ICD-10-CM | POA: Diagnosis not present

## 2013-10-09 DIAGNOSIS — R269 Unspecified abnormalities of gait and mobility: Secondary | ICD-10-CM | POA: Diagnosis not present

## 2013-10-11 DIAGNOSIS — I10 Essential (primary) hypertension: Secondary | ICD-10-CM | POA: Diagnosis not present

## 2013-10-11 DIAGNOSIS — Z9181 History of falling: Secondary | ICD-10-CM | POA: Diagnosis not present

## 2013-10-11 DIAGNOSIS — I69354 Hemiplegia and hemiparesis following cerebral infarction affecting left non-dominant side: Secondary | ICD-10-CM | POA: Diagnosis not present

## 2013-10-11 DIAGNOSIS — M129 Arthropathy, unspecified: Secondary | ICD-10-CM | POA: Diagnosis not present

## 2013-10-14 ENCOUNTER — Ambulatory Visit: Payer: No Typology Code available for payment source

## 2013-10-14 ENCOUNTER — Ambulatory Visit: Payer: No Typology Code available for payment source | Admitting: Occupational Therapy

## 2013-10-14 DIAGNOSIS — IMO0001 Reserved for inherently not codable concepts without codable children: Secondary | ICD-10-CM | POA: Diagnosis not present

## 2013-10-14 DIAGNOSIS — M6281 Muscle weakness (generalized): Secondary | ICD-10-CM | POA: Diagnosis not present

## 2013-10-14 DIAGNOSIS — I69959 Hemiplegia and hemiparesis following unspecified cerebrovascular disease affecting unspecified side: Secondary | ICD-10-CM | POA: Diagnosis not present

## 2013-10-14 DIAGNOSIS — R5381 Other malaise: Secondary | ICD-10-CM | POA: Diagnosis not present

## 2013-10-14 DIAGNOSIS — R269 Unspecified abnormalities of gait and mobility: Secondary | ICD-10-CM | POA: Diagnosis not present

## 2013-10-15 DIAGNOSIS — Z9181 History of falling: Secondary | ICD-10-CM | POA: Diagnosis not present

## 2013-10-15 DIAGNOSIS — I69354 Hemiplegia and hemiparesis following cerebral infarction affecting left non-dominant side: Secondary | ICD-10-CM | POA: Diagnosis not present

## 2013-10-15 DIAGNOSIS — I10 Essential (primary) hypertension: Secondary | ICD-10-CM | POA: Diagnosis not present

## 2013-10-15 DIAGNOSIS — M129 Arthropathy, unspecified: Secondary | ICD-10-CM | POA: Diagnosis not present

## 2013-10-16 ENCOUNTER — Ambulatory Visit: Payer: No Typology Code available for payment source

## 2013-10-16 ENCOUNTER — Ambulatory Visit: Payer: No Typology Code available for payment source | Admitting: Occupational Therapy

## 2013-10-16 DIAGNOSIS — M6281 Muscle weakness (generalized): Secondary | ICD-10-CM | POA: Diagnosis not present

## 2013-10-16 DIAGNOSIS — IMO0001 Reserved for inherently not codable concepts without codable children: Secondary | ICD-10-CM | POA: Diagnosis not present

## 2013-10-16 DIAGNOSIS — I69959 Hemiplegia and hemiparesis following unspecified cerebrovascular disease affecting unspecified side: Secondary | ICD-10-CM | POA: Diagnosis not present

## 2013-10-16 DIAGNOSIS — R5381 Other malaise: Secondary | ICD-10-CM | POA: Diagnosis not present

## 2013-10-16 DIAGNOSIS — R269 Unspecified abnormalities of gait and mobility: Secondary | ICD-10-CM | POA: Diagnosis not present

## 2013-10-17 DIAGNOSIS — I69354 Hemiplegia and hemiparesis following cerebral infarction affecting left non-dominant side: Secondary | ICD-10-CM | POA: Diagnosis not present

## 2013-10-17 DIAGNOSIS — Z9181 History of falling: Secondary | ICD-10-CM | POA: Diagnosis not present

## 2013-10-17 DIAGNOSIS — M129 Arthropathy, unspecified: Secondary | ICD-10-CM | POA: Diagnosis not present

## 2013-10-17 DIAGNOSIS — I10 Essential (primary) hypertension: Secondary | ICD-10-CM | POA: Diagnosis not present

## 2013-10-20 ENCOUNTER — Ambulatory Visit: Payer: No Typology Code available for payment source

## 2013-10-20 ENCOUNTER — Ambulatory Visit: Payer: No Typology Code available for payment source | Admitting: Occupational Therapy

## 2013-10-20 DIAGNOSIS — M129 Arthropathy, unspecified: Secondary | ICD-10-CM | POA: Diagnosis not present

## 2013-10-20 DIAGNOSIS — I69959 Hemiplegia and hemiparesis following unspecified cerebrovascular disease affecting unspecified side: Secondary | ICD-10-CM | POA: Diagnosis not present

## 2013-10-20 DIAGNOSIS — I10 Essential (primary) hypertension: Secondary | ICD-10-CM | POA: Diagnosis not present

## 2013-10-20 DIAGNOSIS — R5381 Other malaise: Secondary | ICD-10-CM | POA: Diagnosis not present

## 2013-10-20 DIAGNOSIS — Z9181 History of falling: Secondary | ICD-10-CM | POA: Diagnosis not present

## 2013-10-20 DIAGNOSIS — M6281 Muscle weakness (generalized): Secondary | ICD-10-CM | POA: Diagnosis not present

## 2013-10-20 DIAGNOSIS — R269 Unspecified abnormalities of gait and mobility: Secondary | ICD-10-CM | POA: Diagnosis not present

## 2013-10-20 DIAGNOSIS — I69354 Hemiplegia and hemiparesis following cerebral infarction affecting left non-dominant side: Secondary | ICD-10-CM | POA: Diagnosis not present

## 2013-10-20 DIAGNOSIS — IMO0001 Reserved for inherently not codable concepts without codable children: Secondary | ICD-10-CM | POA: Diagnosis not present

## 2013-10-23 ENCOUNTER — Ambulatory Visit: Payer: No Typology Code available for payment source | Admitting: Occupational Therapy

## 2013-10-23 ENCOUNTER — Ambulatory Visit: Payer: No Typology Code available for payment source | Admitting: Physical Therapy

## 2013-10-23 DIAGNOSIS — IMO0001 Reserved for inherently not codable concepts without codable children: Secondary | ICD-10-CM | POA: Diagnosis not present

## 2013-10-23 DIAGNOSIS — I69959 Hemiplegia and hemiparesis following unspecified cerebrovascular disease affecting unspecified side: Secondary | ICD-10-CM | POA: Diagnosis not present

## 2013-10-23 DIAGNOSIS — M6281 Muscle weakness (generalized): Secondary | ICD-10-CM | POA: Diagnosis not present

## 2013-10-23 DIAGNOSIS — R269 Unspecified abnormalities of gait and mobility: Secondary | ICD-10-CM | POA: Diagnosis not present

## 2013-10-23 DIAGNOSIS — R5381 Other malaise: Secondary | ICD-10-CM | POA: Diagnosis not present

## 2013-10-27 ENCOUNTER — Encounter: Payer: Medicare Other | Attending: Physical Medicine & Rehabilitation

## 2013-10-27 ENCOUNTER — Ambulatory Visit: Payer: No Typology Code available for payment source | Admitting: Occupational Therapy

## 2013-10-27 ENCOUNTER — Encounter: Payer: Self-pay | Admitting: Physical Medicine & Rehabilitation

## 2013-10-27 ENCOUNTER — Ambulatory Visit: Payer: No Typology Code available for payment source

## 2013-10-27 ENCOUNTER — Ambulatory Visit (HOSPITAL_BASED_OUTPATIENT_CLINIC_OR_DEPARTMENT_OTHER): Payer: Medicare Other | Admitting: Physical Medicine & Rehabilitation

## 2013-10-27 VITALS — BP 132/66 | HR 65 | Resp 14 | Wt 241.8 lb

## 2013-10-27 DIAGNOSIS — R5381 Other malaise: Secondary | ICD-10-CM | POA: Diagnosis not present

## 2013-10-27 DIAGNOSIS — M7500 Adhesive capsulitis of unspecified shoulder: Secondary | ICD-10-CM

## 2013-10-27 DIAGNOSIS — I10 Essential (primary) hypertension: Secondary | ICD-10-CM | POA: Insufficient documentation

## 2013-10-27 DIAGNOSIS — G8110 Spastic hemiplegia affecting unspecified side: Secondary | ICD-10-CM | POA: Diagnosis not present

## 2013-10-27 DIAGNOSIS — I69959 Hemiplegia and hemiparesis following unspecified cerebrovascular disease affecting unspecified side: Secondary | ICD-10-CM | POA: Diagnosis not present

## 2013-10-27 DIAGNOSIS — M7501 Adhesive capsulitis of right shoulder: Secondary | ICD-10-CM | POA: Insufficient documentation

## 2013-10-27 DIAGNOSIS — Z9181 History of falling: Secondary | ICD-10-CM | POA: Diagnosis not present

## 2013-10-27 DIAGNOSIS — I69354 Hemiplegia and hemiparesis following cerebral infarction affecting left non-dominant side: Secondary | ICD-10-CM | POA: Diagnosis not present

## 2013-10-27 DIAGNOSIS — M6281 Muscle weakness (generalized): Secondary | ICD-10-CM | POA: Diagnosis not present

## 2013-10-27 DIAGNOSIS — I6350 Cerebral infarction due to unspecified occlusion or stenosis of unspecified cerebral artery: Secondary | ICD-10-CM

## 2013-10-27 DIAGNOSIS — M129 Arthropathy, unspecified: Secondary | ICD-10-CM | POA: Diagnosis not present

## 2013-10-27 DIAGNOSIS — IMO0001 Reserved for inherently not codable concepts without codable children: Secondary | ICD-10-CM | POA: Diagnosis not present

## 2013-10-27 DIAGNOSIS — R269 Unspecified abnormalities of gait and mobility: Secondary | ICD-10-CM | POA: Diagnosis not present

## 2013-10-27 NOTE — Progress Notes (Signed)
Subjective:    Patient ID: [name], female    DOB: 1951-01-19, 63 y.o.   MRN: [id number] 63 y.o. right handed female with history of hypertension. Patient lives alone and was independent prior to admission. Admitted 08/23/2013 with left-sided weakness of acute onset after returning home from work as well as slurred speech. MRI showed acute infarction within the right posterior limb internal capsule/corona radiata. MRA of the head with no major occlusion or stenosis. Echocardiogram with ejection fraction of 96% grade 1 diastolic dysfunction. Carotid Dopplers with no ICA stenosis. Patient did not receive TPA. Neurology consulted placed on aspirin for CVA prophylaxis as well as enrollment in the Point trial study.   HPI At home with 67 yo g daughter Left shoulder pain, sleeps in a recliner Fell at home about 3 days after discharge from [place]: 08/26/2013  DATE OF DISCHARGE: 09/18/2013    PT OT outpatient neuro rehabilitation center  Pain Inventory Average Pain 0 Pain Right Now 0 My pain is no pain  In the last 24 hours, has pain interfered with the following? General activity 0 Relation with others 0 Enjoyment of life 0 What TIME of day is your pain at its worst? no pain Sleep (in general) Poor  Pain is worse with: no pain Pain improves with: no pain Relief from Meds: no pain  Mobility use a cane how many minutes can you walk? 15 ability to climb steps?  yes do you drive?  no  Function not employed: date last employed 08/2013 disabled: date disabled 08/2013 I need assistance with the following:  meal prep, household duties and shopping  Neuro/Psych bladder control problems spasms loss of taste or smell  Prior Studies Any changes since last visit?  no  Physicians involved in your care Any changes since last visit?  no   History reviewed. No pertinent family history. History   Social History  . Marital Status: Single    Spouse Name: N/A      Number of Children: N/A  . Years of Education: N/A   Social History Main Topics  . Smoking status: Never Smoker   . Smokeless tobacco: None  . Alcohol Use: No  . Drug Use: No  . Sexual Activity: None   Other Topics Concern  . None   Social History Narrative  . None   Past Surgical History  Procedure Laterality Date  . Joint replacement    . Replacement total knee Left    Past Medical History  Diagnosis Date  . Hypertension   . Arthritis    BP 132/66  Pulse 65  Resp 14  Wt 241 lb 12.8 oz (109.68 kg)  SpO2 97%  Opioid Risk Score:   Fall Risk Score: High Fall Risk (>13 points) (educated and given handout on fall prevention inthe home)   Review of Systems  Constitutional:       Loss of taste and smell  Genitourinary:       Bladder control problems  Musculoskeletal:       Spasms  All other systems reviewed and are negative.      Objective:   Physical Exam   Neuro: Mood/affect Flat, Abnormal Motor 2 minus finger flexor, 2 minus biceps, 3 minus deltoid otherwise 0 LUE, 2 minus L HF, 0/5 L foot/ankle , 3 Knee ext   and Dysarthric Musc/Skel:  Normal, moderate left shoulder pain with ER/IR.also pain with overhead activity positive impingement sign   Minimal edema LUE Gen  NAD Ambulates without toe drag or knee instability, anterior AFO      Assessment & Plan:  1.right posterior limb internal capsule/corona radiata.Left hemiparesis-continue outpatient PT OT   2. Hemiplegic shoulder pain appears to be adhesive capsulitis, discussed injection. Patient would like to see how it goes over the next month.  Return to clinic one month if no better injection shoulder

## 2013-10-27 NOTE — Patient Instructions (Signed)
Shoulder injection next visit if no better

## 2013-10-30 ENCOUNTER — Ambulatory Visit: Payer: No Typology Code available for payment source | Admitting: Physical Therapy

## 2013-10-30 ENCOUNTER — Ambulatory Visit: Payer: No Typology Code available for payment source | Admitting: Occupational Therapy

## 2013-10-30 DIAGNOSIS — M129 Arthropathy, unspecified: Secondary | ICD-10-CM | POA: Diagnosis not present

## 2013-10-30 DIAGNOSIS — I10 Essential (primary) hypertension: Secondary | ICD-10-CM | POA: Diagnosis not present

## 2013-10-30 DIAGNOSIS — R5381 Other malaise: Secondary | ICD-10-CM | POA: Diagnosis not present

## 2013-10-30 DIAGNOSIS — M6281 Muscle weakness (generalized): Secondary | ICD-10-CM | POA: Diagnosis not present

## 2013-10-30 DIAGNOSIS — I69354 Hemiplegia and hemiparesis following cerebral infarction affecting left non-dominant side: Secondary | ICD-10-CM | POA: Diagnosis not present

## 2013-10-30 DIAGNOSIS — R269 Unspecified abnormalities of gait and mobility: Secondary | ICD-10-CM | POA: Diagnosis not present

## 2013-10-30 DIAGNOSIS — IMO0001 Reserved for inherently not codable concepts without codable children: Secondary | ICD-10-CM | POA: Diagnosis not present

## 2013-10-30 DIAGNOSIS — I69959 Hemiplegia and hemiparesis following unspecified cerebrovascular disease affecting unspecified side: Secondary | ICD-10-CM | POA: Diagnosis not present

## 2013-10-30 DIAGNOSIS — Z9181 History of falling: Secondary | ICD-10-CM | POA: Diagnosis not present

## 2013-11-03 DIAGNOSIS — M129 Arthropathy, unspecified: Secondary | ICD-10-CM | POA: Diagnosis not present

## 2013-11-03 DIAGNOSIS — I10 Essential (primary) hypertension: Secondary | ICD-10-CM | POA: Diagnosis not present

## 2013-11-03 DIAGNOSIS — I69354 Hemiplegia and hemiparesis following cerebral infarction affecting left non-dominant side: Secondary | ICD-10-CM | POA: Diagnosis not present

## 2013-11-03 DIAGNOSIS — Z9181 History of falling: Secondary | ICD-10-CM | POA: Diagnosis not present

## 2013-11-04 ENCOUNTER — Ambulatory Visit: Payer: No Typology Code available for payment source | Admitting: Occupational Therapy

## 2013-11-04 ENCOUNTER — Ambulatory Visit: Payer: No Typology Code available for payment source | Admitting: Physical Therapy

## 2013-11-04 DIAGNOSIS — R269 Unspecified abnormalities of gait and mobility: Secondary | ICD-10-CM | POA: Diagnosis not present

## 2013-11-04 DIAGNOSIS — IMO0001 Reserved for inherently not codable concepts without codable children: Secondary | ICD-10-CM | POA: Diagnosis not present

## 2013-11-04 DIAGNOSIS — R5381 Other malaise: Secondary | ICD-10-CM | POA: Diagnosis not present

## 2013-11-04 DIAGNOSIS — I69959 Hemiplegia and hemiparesis following unspecified cerebrovascular disease affecting unspecified side: Secondary | ICD-10-CM | POA: Diagnosis not present

## 2013-11-04 DIAGNOSIS — M6281 Muscle weakness (generalized): Secondary | ICD-10-CM | POA: Diagnosis not present

## 2013-11-06 ENCOUNTER — Ambulatory Visit: Payer: Medicare Other | Admitting: Occupational Therapy

## 2013-11-06 ENCOUNTER — Ambulatory Visit: Payer: Medicare Other | Attending: Physical Medicine & Rehabilitation | Admitting: Physical Therapy

## 2013-11-06 DIAGNOSIS — Z5189 Encounter for other specified aftercare: Secondary | ICD-10-CM | POA: Diagnosis not present

## 2013-11-06 DIAGNOSIS — M129 Arthropathy, unspecified: Secondary | ICD-10-CM | POA: Diagnosis not present

## 2013-11-06 DIAGNOSIS — M6281 Muscle weakness (generalized): Secondary | ICD-10-CM | POA: Diagnosis not present

## 2013-11-06 DIAGNOSIS — R5381 Other malaise: Secondary | ICD-10-CM | POA: Insufficient documentation

## 2013-11-06 DIAGNOSIS — I69859 Hemiplegia and hemiparesis following other cerebrovascular disease affecting unspecified side: Secondary | ICD-10-CM | POA: Insufficient documentation

## 2013-11-06 DIAGNOSIS — R269 Unspecified abnormalities of gait and mobility: Secondary | ICD-10-CM | POA: Diagnosis not present

## 2013-11-06 DIAGNOSIS — Z9181 History of falling: Secondary | ICD-10-CM | POA: Diagnosis not present

## 2013-11-06 DIAGNOSIS — I10 Essential (primary) hypertension: Secondary | ICD-10-CM | POA: Diagnosis not present

## 2013-11-06 DIAGNOSIS — I69354 Hemiplegia and hemiparesis following cerebral infarction affecting left non-dominant side: Secondary | ICD-10-CM | POA: Diagnosis not present

## 2013-11-07 ENCOUNTER — Encounter (HOSPITAL_COMMUNITY): Payer: Self-pay | Admitting: Emergency Medicine

## 2013-11-07 ENCOUNTER — Emergency Department (HOSPITAL_COMMUNITY): Payer: Medicare Other

## 2013-11-07 ENCOUNTER — Emergency Department (HOSPITAL_COMMUNITY)
Admission: EM | Admit: 2013-11-07 | Discharge: 2013-11-07 | Disposition: A | Discharge status: Home / Self Care | Payer: Medicare Other | Attending: Emergency Medicine | Admitting: Emergency Medicine

## 2013-11-07 DIAGNOSIS — I6789 Other cerebrovascular disease: Secondary | ICD-10-CM | POA: Diagnosis not present

## 2013-11-07 DIAGNOSIS — Y92000 Kitchen of unspecified non-institutional (private) residence as  the place of occurrence of the external cause: Secondary | ICD-10-CM | POA: Insufficient documentation

## 2013-11-07 DIAGNOSIS — Z7982 Long term (current) use of aspirin: Secondary | ICD-10-CM | POA: Insufficient documentation

## 2013-11-07 DIAGNOSIS — I10 Essential (primary) hypertension: Secondary | ICD-10-CM | POA: Insufficient documentation

## 2013-11-07 DIAGNOSIS — Y9301 Activity, walking, marching and hiking: Secondary | ICD-10-CM | POA: Diagnosis not present

## 2013-11-07 DIAGNOSIS — Z8673 Personal history of transient ischemic attack (TIA), and cerebral infarction without residual deficits: Secondary | ICD-10-CM | POA: Diagnosis not present

## 2013-11-07 DIAGNOSIS — S0101XA Laceration without foreign body of scalp, initial encounter: Secondary | ICD-10-CM | POA: Diagnosis not present

## 2013-11-07 DIAGNOSIS — S199XXA Unspecified injury of neck, initial encounter: Secondary | ICD-10-CM | POA: Diagnosis not present

## 2013-11-07 DIAGNOSIS — Z79899 Other long term (current) drug therapy: Secondary | ICD-10-CM | POA: Diagnosis not present

## 2013-11-07 DIAGNOSIS — S0181XA Laceration without foreign body of other part of head, initial encounter: Secondary | ICD-10-CM | POA: Diagnosis not present

## 2013-11-07 DIAGNOSIS — R531 Weakness: Secondary | ICD-10-CM | POA: Diagnosis not present

## 2013-11-07 DIAGNOSIS — W01198A Fall on same level from slipping, tripping and stumbling with subsequent striking against other object, initial encounter: Secondary | ICD-10-CM | POA: Insufficient documentation

## 2013-11-07 DIAGNOSIS — W19XXXA Unspecified fall, initial encounter: Secondary | ICD-10-CM

## 2013-11-07 DIAGNOSIS — M1990 Unspecified osteoarthritis, unspecified site: Secondary | ICD-10-CM | POA: Insufficient documentation

## 2013-11-07 DIAGNOSIS — S098XXA Other specified injuries of head, initial encounter: Secondary | ICD-10-CM | POA: Diagnosis not present

## 2013-11-07 HISTORY — DX: Cerebral infarction, unspecified: I63.9

## 2013-11-07 NOTE — ED Provider Notes (Signed)
CSN: [id number]     Arrival date & time 11/07/13  1103 History   First MD Initiated Contact with Patient 11/07/13 1116     Chief Complaint  Patient presents with  . Fall     (Consider location/radiation/quality/duration/timing/severity/associated sxs/prior Treatment) Patient is a 63 y.o. female presenting with fall.  Fall This is a new problem. The current episode started less than 1 hour ago. Episode frequency: once. The problem has been resolved. Associated symptoms include headaches (posterior). Pertinent negatives include no chest pain, no abdominal pain and no shortness of breath. Associated symptoms comments: No LOC, no neck pain, no back pain. Nothing aggravates the symptoms. Nothing relieves the symptoms.    Past Medical History  Diagnosis Date  . Hypertension   . Arthritis   . Stroke    Past Surgical History  Procedure Laterality Date  . Joint replacement    . Replacement total knee Left    History reviewed. No pertinent family history. History  Substance Use Topics  . Smoking status: Never Smoker   . Smokeless tobacco: Not on file  . Alcohol Use: No   OB History   Grav Para Term Preterm Abortions TAB SAB Ect Mult Living                 Review of Systems  Respiratory: Negative for shortness of breath.   Cardiovascular: Negative for chest pain.  Gastrointestinal: Negative for abdominal pain.  Neurological: Positive for headaches (posterior).  All other systems reviewed and are negative.     Allergies  Review of patient's allergies indicates no known allergies.  Home Medications   Prior to Admission medications   Medication Sig Start Date End Date Taking? Authorizing Provider  aspirin 81 MG EC tablet Take 1 tablet (81 mg total) by mouth daily. 09/18/13  Yes Daniel J Angiulli, PA-C  atorvastatin (LIPITOR) 40 MG tablet Take 1 tablet (40 mg total) by mouth daily at 6 PM. 09/18/13  Yes Lavon Paganini Angiulli, PA-C  metoprolol tartrate (LOPRESSOR) 12.5 mg TABS  tablet Take 0.5 tablets (12.5 mg total) by mouth 2 (two) times daily. 09/18/13  Yes Lavon Paganini Angiulli, PA-C  research study medication Take 75 mg by mouth daily with breakfast. 09/18/13  Yes Daniel J Angiulli, PA-C   BP 130/73  Pulse 76  Temp(Src) 98.6 F (37 C) (Oral)  Resp 20  SpO2 97% Physical Exam  Nursing note and vitals reviewed. Constitutional: She is oriented to person, place, and time. She appears well-developed and well-nourished. No distress.  HENT:  Head: Normocephalic and atraumatic. Head is without raccoon's eyes and without Battle's sign.    Nose: Nose normal.  Eyes: Conjunctivae and EOM are normal. Pupils are equal, round, and reactive to light. No scleral icterus.  Neck: No spinous process tenderness and no muscular tenderness present.  Cardiovascular: Normal rate, regular rhythm, normal heart sounds and intact distal pulses.   No murmur heard. Pulmonary/Chest: Effort normal and breath sounds normal. She has no rales. She exhibits no tenderness.  Abdominal: Soft. There is no tenderness. There is no rebound and no guarding.  Musculoskeletal: Normal range of motion. She exhibits no edema and no tenderness.       Thoracic back: She exhibits no tenderness and no bony tenderness.       Lumbar back: She exhibits no tenderness and no bony tenderness.  No evidence of trauma to extremities, except as noted.  2+ distal pulses.    Neurological: She is alert and oriented to person,  place, and time.  Skin: Skin is warm and dry. No rash noted.  Psychiatric: She has a normal mood and affect.    ED Course  Procedures (including critical care time) Labs Review Labs Reviewed - No data to display  Imaging Review Ct Head Wo Contrast  11/07/2013   CLINICAL DATA:  Patient fell backwards off of a stool today striking the back forehead with laceration; no loss of consciousness  EXAM: CT HEAD WITHOUT CONTRAST  CT CERVICAL SPINE WITHOUT CONTRAST  TECHNIQUE: Multidetector CT imaging of the  head and cervical spine was performed following the standard protocol without intravenous contrast. Multiplanar CT image reconstructions of the cervical spine were also generated.  COMPARISON:  Noncontrast CT scan of the brain of November 07, 2013  FINDINGS: CT HEAD FINDINGS  The ventricles are normal in size and position. There is no intracranial hemorrhage nor intracranial mass effect. Decreased density in the deep white matter of both cerebral hemispheres consistent with chronic small vessel ischemic change. There is an old lacunar infarction in the right basal ganglia and corona radiata. No acute ischemic changes are demonstrated. The cerebellum and brainstem are unremarkable.  There is mucoperiosteal thickening within the ethmoid sinus cells. Elsewhere the visualized paranasal sinuses as well as mastoid air cells are clear. There is no acute skull fracture. There is mild soft tissue swelling over the left posterior parietal and occipital region.  CT CERVICAL SPINE FINDINGS  There is mild reversal of the normal cervical lordosis. There is disc space narrowing at C5-C6, C6-C7, and to a lesser extent C4-5. There are anterior osteophytes at these levels. There is a large posterior osteophyte arising from the superior aspect of C3 which produces a mild impression upon the thecal sac. The prevertebral soft tissue spaces are normal. There is no perched facet nor spinous process fracture. The odontoid is intact.  IMPRESSION: 1. There is no acute intracranial hemorrhage nor acute skull fracture. There are stable changes of chronic small vessel ischemia. An old right basal ganglia/ corona radiata lacunar infarction is noted. 2. Reversal of the normal cervical lordosis is consistent with muscle spasm. There is no acute cervical spine fracture. There is degenerative disc change at multiple mid cervical levels.   Electronically Signed   By: David  Martinique   On: 11/07/2013 13:19   Ct Cervical Spine Wo Contrast  11/07/2013    CLINICAL DATA:  Patient fell backwards off of a stool today striking the back forehead with laceration; no loss of consciousness  EXAM: CT HEAD WITHOUT CONTRAST  CT CERVICAL SPINE WITHOUT CONTRAST  TECHNIQUE: Multidetector CT imaging of the head and cervical spine was performed following the standard protocol without intravenous contrast. Multiplanar CT image reconstructions of the cervical spine were also generated.  COMPARISON:  Noncontrast CT scan of the brain of November 07, 2013  FINDINGS: CT HEAD FINDINGS  The ventricles are normal in size and position. There is no intracranial hemorrhage nor intracranial mass effect. Decreased density in the deep white matter of both cerebral hemispheres consistent with chronic small vessel ischemic change. There is an old lacunar infarction in the right basal ganglia and corona radiata. No acute ischemic changes are demonstrated. The cerebellum and brainstem are unremarkable.  There is mucoperiosteal thickening within the ethmoid sinus cells. Elsewhere the visualized paranasal sinuses as well as mastoid air cells are clear. There is no acute skull fracture. There is mild soft tissue swelling over the left posterior parietal and occipital region.  CT CERVICAL SPINE FINDINGS  There is mild reversal of the normal cervical lordosis. There is disc space narrowing at C5-C6, C6-C7, and to a lesser extent C4-5. There are anterior osteophytes at these levels. There is a large posterior osteophyte arising from the superior aspect of C3 which produces a mild impression upon the thecal sac. The prevertebral soft tissue spaces are normal. There is no perched facet nor spinous process fracture. The odontoid is intact.  IMPRESSION: 1. There is no acute intracranial hemorrhage nor acute skull fracture. There are stable changes of chronic small vessel ischemia. An old right basal ganglia/ corona radiata lacunar infarction is noted. 2. Reversal of the normal cervical lordosis is consistent with  muscle spasm. There is no acute cervical spine fracture. There is degenerative disc change at multiple mid cervical levels.   Electronically Signed   By: David  Martinique   On: 11/07/2013 13:19  All radiology studies independently viewed by me.      EKG Interpretation   Date/Time:  Friday November 07 2013 11:08:41 EDT Ventricular Rate:  78 PR Interval:  218 QRS Duration: 75 QT Interval:  401 QTC Calculation: 457 R Axis:   40 Text Interpretation:  Age not entered, assumed to be  63 years old for  purpose of ECG interpretation Sinus rhythm Prolonged PR interval  Borderline T wave abnormalities No significant change was found Confirmed  by [facility]  MD, TREY ([id number]) on 11/07/2013 1:07:18 PM      MDM   Final diagnoses:  Fall, initial encounter  Scalp laceration, initial encounter    63 yo female on aspirin who presented after a mechanical fall.  Her stool slipped out from under her and she fell to the floor, landing on her bottom and hitting her head on the stool.  She has a small lac to back of head.  Otherwise no signs of traumatic injury.  She denies pain anywhere else, including low back/pelvis.  Plan CT head/cspine and staple lac.  She says last tetanus shot was this year.  CT head, c spine were negative.  Lac stapled.  Pt ambulated.  [place] home.    Artis Delay, MD 11/07/13 [phone]

## 2013-11-07 NOTE — ED Notes (Signed)
63 yo female from home via [facility] with c/o Fall. Pt was in kitchen walking around, chair moved and pt slipped back and fell back on to bottom and hit head. Left occipital 2 cm Laceration. Not on any Blood thinners. Passed [facility], no c/o of pain just soreness. Alert/Oriented. Hx Stroke July with left sided residual.  Vitals 160/90  NOW 130/73 88 HR 98% RA cbg 174

## 2013-11-07 NOTE — Discharge Instructions (Signed)

## 2013-11-11 ENCOUNTER — Ambulatory Visit: Payer: Medicare Other | Admitting: Physical Therapy

## 2013-11-11 ENCOUNTER — Ambulatory Visit: Payer: Medicare Other | Admitting: Occupational Therapy

## 2013-11-11 DIAGNOSIS — Z5189 Encounter for other specified aftercare: Secondary | ICD-10-CM | POA: Diagnosis not present

## 2013-11-13 ENCOUNTER — Ambulatory Visit: Payer: Medicare Other | Admitting: Rehabilitative and Restorative Service Providers"

## 2013-11-13 ENCOUNTER — Ambulatory Visit: Payer: Medicare Other | Admitting: Occupational Therapy

## 2013-11-13 DIAGNOSIS — Z5189 Encounter for other specified aftercare: Secondary | ICD-10-CM | POA: Diagnosis not present

## 2013-11-14 DIAGNOSIS — M129 Arthropathy, unspecified: Secondary | ICD-10-CM | POA: Diagnosis not present

## 2013-11-14 DIAGNOSIS — Z9181 History of falling: Secondary | ICD-10-CM | POA: Diagnosis not present

## 2013-11-14 DIAGNOSIS — I10 Essential (primary) hypertension: Secondary | ICD-10-CM | POA: Diagnosis not present

## 2013-11-14 DIAGNOSIS — I69354 Hemiplegia and hemiparesis following cerebral infarction affecting left non-dominant side: Secondary | ICD-10-CM | POA: Diagnosis not present

## 2013-11-15 DIAGNOSIS — Z4802 Encounter for removal of sutures: Secondary | ICD-10-CM | POA: Diagnosis not present

## 2013-11-15 DIAGNOSIS — Z23 Encounter for immunization: Secondary | ICD-10-CM | POA: Diagnosis not present

## 2013-11-15 DIAGNOSIS — I6300 Cerebral infarction due to thrombosis of unspecified precerebral artery: Secondary | ICD-10-CM | POA: Diagnosis not present

## 2013-11-15 DIAGNOSIS — I10 Essential (primary) hypertension: Secondary | ICD-10-CM | POA: Diagnosis not present

## 2013-11-18 ENCOUNTER — Ambulatory Visit: Payer: Medicare Other | Admitting: Physical Therapy

## 2013-11-18 ENCOUNTER — Ambulatory Visit: Payer: Medicare Other | Admitting: Occupational Therapy

## 2013-11-18 ENCOUNTER — Telehealth: Payer: Self-pay | Admitting: Neurology

## 2013-11-18 DIAGNOSIS — M6281 Muscle weakness (generalized): Secondary | ICD-10-CM | POA: Diagnosis not present

## 2013-11-18 DIAGNOSIS — I69859 Hemiplegia and hemiparesis following other cerebrovascular disease affecting unspecified side: Secondary | ICD-10-CM | POA: Diagnosis not present

## 2013-11-18 DIAGNOSIS — R269 Unspecified abnormalities of gait and mobility: Secondary | ICD-10-CM | POA: Diagnosis not present

## 2013-11-18 DIAGNOSIS — R5381 Other malaise: Secondary | ICD-10-CM | POA: Diagnosis not present

## 2013-11-18 DIAGNOSIS — Z5189 Encounter for other specified aftercare: Secondary | ICD-10-CM | POA: Diagnosis present

## 2013-11-18 NOTE — Telephone Encounter (Signed)
Left pt. A message to return my call regarding the study trial.

## 2013-11-19 ENCOUNTER — Encounter: Payer: Self-pay | Admitting: Neurology

## 2013-11-20 ENCOUNTER — Encounter (INDEPENDENT_AMBULATORY_CARE_PROVIDER_SITE_OTHER): Payer: Self-pay | Admitting: Neurology

## 2013-11-20 ENCOUNTER — Ambulatory Visit: Payer: Medicare Other | Admitting: Occupational Therapy

## 2013-11-20 ENCOUNTER — Ambulatory Visit: Payer: Medicare Other | Admitting: Physical Therapy

## 2013-11-20 DIAGNOSIS — Z0289 Encounter for other administrative examinations: Secondary | ICD-10-CM

## 2013-11-20 DIAGNOSIS — Z5189 Encounter for other specified aftercare: Secondary | ICD-10-CM | POA: Diagnosis not present

## 2013-11-24 ENCOUNTER — Ambulatory Visit: Payer: Medicare Other | Admitting: Physical Medicine & Rehabilitation

## 2013-11-24 ENCOUNTER — Telehealth: Payer: Self-pay | Admitting: Neurology

## 2013-11-24 ENCOUNTER — Ambulatory Visit: Payer: Medicare Other | Admitting: Physical Therapy

## 2013-11-24 ENCOUNTER — Encounter: Payer: Medicare Other | Admitting: Occupational Therapy

## 2013-11-24 ENCOUNTER — Encounter: Payer: Medicare Other | Attending: Physical Medicine & Rehabilitation

## 2013-11-24 NOTE — Telephone Encounter (Signed)
I spoke to the patient to let her know that her Follow Up appointment was rescheduled to 02/24/2014 at 8:45h.

## 2013-11-24 NOTE — Telephone Encounter (Signed)
I attempted without success to contact the patient to reschedule her Follow-Up appointment. I asked the patient to return my call.

## 2013-11-28 ENCOUNTER — Encounter: Payer: Medicare Other | Admitting: Occupational Therapy

## 2013-11-28 ENCOUNTER — Ambulatory Visit: Payer: Medicare Other | Admitting: Physical Therapy

## 2013-12-02 ENCOUNTER — Ambulatory Visit: Payer: Medicare Other | Admitting: Physical Therapy

## 2013-12-02 ENCOUNTER — Ambulatory Visit: Payer: Medicare Other | Admitting: Occupational Therapy

## 2013-12-02 DIAGNOSIS — Z5189 Encounter for other specified aftercare: Secondary | ICD-10-CM | POA: Diagnosis not present

## 2013-12-05 ENCOUNTER — Ambulatory Visit: Payer: Medicare Other | Admitting: Occupational Therapy

## 2013-12-05 ENCOUNTER — Ambulatory Visit: Payer: Medicare Other | Admitting: Physical Therapy

## 2013-12-05 DIAGNOSIS — Z5189 Encounter for other specified aftercare: Secondary | ICD-10-CM | POA: Diagnosis not present

## 2013-12-10 ENCOUNTER — Ambulatory Visit: Payer: Medicare Other | Admitting: Occupational Therapy

## 2013-12-10 ENCOUNTER — Encounter: Payer: Self-pay | Admitting: Physical Therapy

## 2013-12-10 ENCOUNTER — Ambulatory Visit: Payer: Medicare Other | Attending: Physical Medicine & Rehabilitation | Admitting: Physical Therapy

## 2013-12-10 DIAGNOSIS — M6281 Muscle weakness (generalized): Secondary | ICD-10-CM | POA: Insufficient documentation

## 2013-12-10 DIAGNOSIS — Z5189 Encounter for other specified aftercare: Secondary | ICD-10-CM | POA: Diagnosis not present

## 2013-12-10 DIAGNOSIS — R269 Unspecified abnormalities of gait and mobility: Secondary | ICD-10-CM | POA: Insufficient documentation

## 2013-12-10 DIAGNOSIS — R5381 Other malaise: Secondary | ICD-10-CM | POA: Diagnosis not present

## 2013-12-10 DIAGNOSIS — I69859 Hemiplegia and hemiparesis following other cerebrovascular disease affecting unspecified side: Secondary | ICD-10-CM | POA: Diagnosis not present

## 2013-12-10 DIAGNOSIS — IMO0002 Reserved for concepts with insufficient information to code with codable children: Secondary | ICD-10-CM

## 2013-12-10 DIAGNOSIS — R6889 Other general symptoms and signs: Secondary | ICD-10-CM

## 2013-12-10 NOTE — Therapy (Signed)
Occupational Therapy Treatment  Patient Details  Name: [name] MRN: [id number] Date of Birth: 08/18/1950  Encounter Date: 2013-12-15      OT End of Session - Dec 15, 2013 1027    Visit Number 20   Number of Visits 32   Date for OT Re-Evaluation 01/16/14   OT Start Time 0928   OT Stop Time 1015   OT Time Calculation (min) 47 min   Activity Tolerance Patient tolerated treatment well      Past Medical History  Diagnosis Date  . Hypertension   . Arthritis   . Stroke     Past Surgical History  Procedure Laterality Date  . Joint replacement    . Replacement total knee Left     There were no vitals taken for this visit.  Visit Diagnosis:  Weakness due to cerebrovascular accident  Lack of coordination due to stroke              OT Short Term Goals - 2013/12/15 0955    OT SHORT TERM GOAL #1   Title I with updated HEP   Time 1   Period Weeks   Status New   OT SHORT TERM GOAL #2   Title Pt will report ability to use LUE as a stabilizer 50% of the time for ADLs/ home management.   Time 1   Period Weeks   Status New   OT SHORT TERM GOAL #3   Title Pt will demonstrate 50% composite finger flexion for grasping items.   Time 1   Period Weeks   Status New   OT SHORT TERM GOAL #4   Title Pt. will demonstrate 25% composite finger extension in prep for functional use of LUE.   Time 1   Period Weeks   Status New          OT Long Term Goals - 15-Dec-2013 0957    OT LONG TERM GOAL #1   Title Pt will demonstrate 40* A/ROM shoulder flexion in prep for forward reaching.   Time 5   Period Weeks   OT LONG TERM GOAL #2   Title Pt will report ability to use LUE as a stabilizer/ gross A 75% of the time.   Time 5   Period Weeks   Status New   OT LONG TERM GOAL #3   Title Pt will perform basic cooking/ home management at a modified indpendent level.   Time 5   Period Weeks   Status New          Plan - 15-Dec-2013 1031    Clinical Impression Statement Pt s/p CVA  presents with leeft hemiplegia which impedes ADLs/ IADLs.   Pt will benefit from skilled therapeutic intervention in order to improve on the following deficits Abnormal gait;Decreased coordination;Decreased safety awareness;Impaired UE functional use;Pain;Decreased activity tolerance;Decreased strength;Decreased balance;Decreased mobility;Difficulty walking;Impaired flexibility;Decreased cognition;Decreased range of motion;Increased edema   Rehab Potential Good   Clinical Impairments Affecting Rehab Potential decreased body awareness, cognition   OT Frequency Min 2X/week   OT Duration 8 weeks   OT Treatment/Interventions Self-care/ADL training;Therapeutic exercise;Manual therapy;Modalities;Patient/family education;Neuromuscular education;Splinting;Balance training;Energy conservation;Therapeutic activities;DME and/or AE instruction;Cognitive remediation/compensation   OT Plan Neuro re-ed to LUE          G-Codes - 12/15/13 1036    Functional Assessment Tool Used Fim current min A sock/ shoe, uses LUE as stabilizer 25% of time   Functional Limitation Self care   Self Care Current Status (N8295) At least 20 percent but less  than 40 percent impaired, limited or restricted   Self Care Goal Status (K9381) At least 1 percent but less than 20 percent impaired, limited or restricted      Problem List Patient Active Problem List   Diagnosis Date Noted  . Spastic hemiplegia affecting nondominant side 10/27/2013  . Adhesive capsulitis of right shoulder 10/27/2013  . Chronic diastolic heart failure 82/99/3716  . Dyslipidemia 08/26/2013  . Borderline diabetes 08/26/2013  . CVA (cerebral infarction) 08/26/2013  . Acute CVA (cerebrovascular accident) 08/23/2013  . Pre-syncope 08/17/2012  . Hypokalemia 08/17/2012  . ARTHRITIS 03/28/2010  . DE QUERVAIN'S TENOSYNOVITIS 03/28/2010  . Normocytic anemia 07/26/2009  . REACTIVE AIRWAY DISEASE 03/09/2009  . DENTAL CARIES 03/09/2009  . CANDIDIASIS OF  VULVA AND VAGINA 06/04/2008  . SKIN TAG 06/04/2008  . ARTHRITIS, KNEES, BILATERAL 05/22/2008  . HYPERTENSION, BENIGN ESSENTIAL 05/04/2008  . KNEE PAIN, BILATERAL 05/04/2008  . COUGH 05/04/2008  . MENISCUS TEAR, RIGHT 12/07/2005     VMS to LUE 50 pps, 250 pw, 10 secs cycle 2 sec ramp, to wrist and finger flexors and extensors alternately. Intensity flex: 19, ext: 21 with therapist facillitating wrist extension. Neuromuscular re-ed with therapist facillitatingt AA/ROM finger flexion/ extension, supination pronation and functional grasp release of blocks mod-max facillitation.                                       Willis Holquin 12/10/2013, 10:43 AM

## 2013-12-10 NOTE — Therapy (Signed)
Physical Therapy Treatment  Patient Details  Name: [name] MRN: [id number] Date of Birth: 1950/03/30  Encounter Date: 12/10/2013      PT End of Session - 12/10/13 1257    Visit Number 20   Number of Visits 33   Date for PT Re-Evaluation 01/09/14   PT Start Time 5929   PT Stop Time 1100   PT Time Calculation (min) 45 min   Equipment Utilized During Treatment Gait belt   Activity Tolerance Patient tolerated treatment well      Past Medical History  Diagnosis Date  . Hypertension   . Arthritis   . Stroke     Past Surgical History  Procedure Laterality Date  . Joint replacement    . Replacement total knee Left     There were no vitals taken for this visit.  Visit Diagnosis:  Abnormality of gait  Activity intolerance  Debility        [name] PT Assessment - 12/10/13 1200    Precautions   Precautions Fall          Adult PT Treatment/Exercise - 12/10/13 0700    Transfers   Transfers Sit to Stand;Stand to Sit   Sit to Stand 6: Modified independent (Device/Increase time);With upper extremity assist;From chair/3-in-1;From bed   Stand to Sit 6: Modified independent (Device/Increase time)   Ambulation/Gait   Ambulation/Gait Yes   Ambulation/Gait Assistance 6: Modified independent (Device/Increase time)   Ambulation Distance (Feet) 300 Feet   Assistive device Large base quad cane   Gait Pattern Step-through pattern;Decreased stride length;Decreased dorsiflexion - left   Gait velocity decreased   Ramp 6: Modified independent (Device)   Ramp Details (indicate cue type and reason) with Large based quad cane   Curb 6: Modified independent (Device/increase time)   Curb Details (indicate cue type and reason) with large based quad cane   [name]   Sit to Stand Able to stand without using hands and stabilize independently   Standing Unsupported Able to stand safely 2 minutes   Sitting with Back Unsupported but Feet Supported on Floor or Stool Able to sit  safely and securely 2 minutes   Stand to Sit Sits safely with minimal use of hands   Transfers Able to transfer safely, minor use of hands   Standing Unsupported with Eyes Closed Able to stand 10 seconds with supervision   Standing Ubsupported with Feet Together Able to place feet together independently and stand 1 minute safely   From Standing, Reach Forward with Outstretched Arm Can reach forward >12 cm safely (5")   From Standing Position, Pick up Object from Floor Able to pick up shoe, needs supervision   From Standing Position, Turn to Look Behind Over each Shoulder Looks behind from both sides and weight shifts well   Turn 360 Degrees Needs close supervision or verbal cueing   Standing Unsupported, Alternately Place Feet on Step/Stool Able to complete 4 steps without aid or supervision   Standing Unsupported, One Foot in Front Able to take small step independently and hold 30 seconds   Standing on One Leg Able to lift leg independently and hold > 10 seconds   Total Score 46   Timed Up and Go Test   TUG Normal TUG   Normal TUG (seconds) 28.47  with [facility]            PT Short Term Goals - 12/10/13 1026    PT SHORT TERM GOAL #1   Title Reaches 5  inches without balance loss.   Time 2   Period Days  12/12/2013   Status Achieved   PT SHORT TERM GOAL #2   Title Timed up and go <34 seconds.   Time 2   Period Days   Status Achieved   PT SHORT TERM GOAL #3   Title Ambulates 250 feet with LRAD and AFO with supervision.   Time 2   Period Days   Status Achieved   PT SHORT TERM GOAL #4   Title Negotiate ramp and curb with LRAD and AFO with supervision.   Time 2   Period Days   Status Achieved          PT Long Term Goals - 12/10/13 1032    PT LONG TERM GOAL #1   Title Verbalize understanding of: risk factors and warning signs/symptoms of CVA   Time 4   Period Weeks  01/09/2014   Status Achieved   PT LONG TERM GOAL #2   Title Berg Balance >/= 40/56.   Time 4   Period  Weeks   Status Achieved   PT LONG TERM GOAL #3   Title Timed up and go <30 seconds.   Time 4   Period Weeks   Status Achieved   PT LONG TERM GOAL #4   Title Ambulates 300 feet with LRAD and AFO modified independent.   Time 4   Period Weeks   Status Achieved   PT LONG TERM GOAL #5   Title Negotiate ramp, curb, and stairs with LRAD and AFO modified independent.   Time 4   Period Weeks   Status Partially Met          Plan - 12/10/13 1258    Clinical Impression Statement Pt making great progress toward goals with some STG's and LTG's met today.   Pt will benefit from skilled therapeutic intervention in order to improve on the following deficits Abnormal gait;Decreased mobility;Decreased strength;Decreased coordination;Decreased balance;Decreased knowledge of use of DME;Decreased activity tolerance;Difficulty walking;Decreased range of motion;Decreased cognition;Decreased safety awareness   Rehab Potential Good   PT Frequency Min 2X/week   PT Duration 8 weeks   PT Treatment/Interventions Therapeutic activities;Neuromuscular re-education;Gait training;Therapeutic exercise;Patient/family education;Other (comment)  Orthotic fitting/trainin, HEP, self care/home mgt   PT Plan assess remaining LTG'S for discharge next visit per PT POC        Problem List Patient Active Problem List   Diagnosis Date Noted  . Spastic hemiplegia affecting nondominant side 10/27/2013  . Adhesive capsulitis of right shoulder 10/27/2013  . Chronic diastolic heart failure 68/12/5724  . Dyslipidemia 08/26/2013  . Borderline diabetes 08/26/2013  . CVA (cerebral infarction) 08/26/2013  . Acute CVA (cerebrovascular accident) 08/23/2013  . Pre-syncope 08/17/2012  . Hypokalemia 08/17/2012  . ARTHRITIS 03/28/2010  . DE QUERVAIN'S TENOSYNOVITIS 03/28/2010  . Normocytic anemia 07/26/2009  . REACTIVE AIRWAY DISEASE 03/09/2009  . DENTAL CARIES 03/09/2009  . CANDIDIASIS OF VULVA AND VAGINA 06/04/2008  . SKIN  TAG 06/04/2008  . ARTHRITIS, KNEES, BILATERAL 05/22/2008  . HYPERTENSION, BENIGN ESSENTIAL 05/04/2008  . KNEE PAIN, BILATERAL 05/04/2008  . COUGH 05/04/2008  . MENISCUS TEAR, RIGHT 12/07/2005         Willow Ora 12/10/2013, 1:07 PM

## 2013-12-10 NOTE — Therapy (Signed)
Physical Therapy Treatment  Patient Details  Name: [name] MRN: [id number] Date of Birth: 1950/03/30  Encounter Date: 12/10/2013      PT End of Session - 12/10/13 1257    Visit Number 20   Number of Visits 33   Date for PT Re-Evaluation 01/09/14   PT Start Time 5929   PT Stop Time 1100   PT Time Calculation (min) 45 min   Equipment Utilized During Treatment Gait belt   Activity Tolerance Patient tolerated treatment well      Past Medical History  Diagnosis Date  . Hypertension   . Arthritis   . Stroke     Past Surgical History  Procedure Laterality Date  . Joint replacement    . Replacement total knee Left     There were no vitals taken for this visit.  Visit Diagnosis:  Abnormality of gait  Activity intolerance  Debility        [name] PT Assessment - 12/10/13 1200    Precautions   Precautions Fall          Adult PT Treatment/Exercise - 12/10/13 0700    Transfers   Transfers Sit to Stand;Stand to Sit   Sit to Stand 6: Modified independent (Device/Increase time);With upper extremity assist;From chair/3-in-1;From bed   Stand to Sit 6: Modified independent (Device/Increase time)   Ambulation/Gait   Ambulation/Gait Yes   Ambulation/Gait Assistance 6: Modified independent (Device/Increase time)   Ambulation Distance (Feet) 300 Feet   Assistive device Large base quad cane   Gait Pattern Step-through pattern;Decreased stride length;Decreased dorsiflexion - left   Gait velocity decreased   Ramp 6: Modified independent (Device)   Ramp Details (indicate cue type and reason) with Large based quad cane   Curb 6: Modified independent (Device/increase time)   Curb Details (indicate cue type and reason) with large based quad cane   [name]   Sit to Stand Able to stand without using hands and stabilize independently   Standing Unsupported Able to stand safely 2 minutes   Sitting with Back Unsupported but Feet Supported on Floor or Stool Able to sit  safely and securely 2 minutes   Stand to Sit Sits safely with minimal use of hands   Transfers Able to transfer safely, minor use of hands   Standing Unsupported with Eyes Closed Able to stand 10 seconds with supervision   Standing Ubsupported with Feet Together Able to place feet together independently and stand 1 minute safely   From Standing, Reach Forward with Outstretched Arm Can reach forward >12 cm safely (5")   From Standing Position, Pick up Object from Floor Able to pick up shoe, needs supervision   From Standing Position, Turn to Look Behind Over each Shoulder Looks behind from both sides and weight shifts well   Turn 360 Degrees Needs close supervision or verbal cueing   Standing Unsupported, Alternately Place Feet on Step/Stool Able to complete 4 steps without aid or supervision   Standing Unsupported, One Foot in Front Able to take small step independently and hold 30 seconds   Standing on One Leg Able to lift leg independently and hold > 10 seconds   Total Score 46   Timed Up and Go Test   TUG Normal TUG   Normal TUG (seconds) 28.47  with [facility]            PT Short Term Goals - 12/10/13 1026    PT SHORT TERM GOAL #1   Title Reaches 5  inches without balance loss.   Time 2   Period Days  12/12/2013   Status Achieved   PT SHORT TERM GOAL #2   Title Timed up and go <34 seconds.   Time 2   Period Days   Status Achieved   PT SHORT TERM GOAL #3   Title Ambulates 250 feet with LRAD and AFO with supervision.   Time 2   Period Days   Status Achieved   PT SHORT TERM GOAL #4   Title Negotiate ramp and curb with LRAD and AFO with supervision.   Time 2   Period Days   Status Achieved          PT Long Term Goals - 12/29/13 1032    PT LONG TERM GOAL #1   Title Verbalize understanding of: risk factors and warning signs/symptoms of CVA   Time 4   Period Weeks  01/09/2014   Status Achieved   PT LONG TERM GOAL #2   Title Berg Balance >/= 40/56.   Time 4   Period  Weeks   Status Achieved   PT LONG TERM GOAL #3   Title Timed up and go <30 seconds.   Time 4   Period Weeks   Status Achieved   PT LONG TERM GOAL #4   Title Ambulates 300 feet with LRAD and AFO modified independent.   Time 4   Period Weeks   Status Achieved   PT LONG TERM GOAL #5   Title Negotiate ramp, curb, and stairs with LRAD and AFO modified independent.   Time 4   Period Weeks   Status Partially Met          Plan - 29-Dec-2013 1258    Clinical Impression Statement Pt making great progress toward goals with some STG's and LTG's met today.   Pt will benefit from skilled therapeutic intervention in order to improve on the following deficits Abnormal gait;Decreased mobility;Decreased strength;Decreased coordination;Decreased balance;Decreased knowledge of use of DME;Decreased activity tolerance;Difficulty walking;Decreased range of motion;Decreased cognition;Decreased safety awareness   Rehab Potential Good   PT Frequency Min 2X/week   PT Duration 8 weeks   PT Treatment/Interventions Therapeutic activities;Neuromuscular re-education;Gait training;Therapeutic exercise;Patient/family education;Other (comment)  Orthotic fitting/trainin, HEP, self care/home mgt   PT Plan assess remaining LTG'S for discharge next visit per PT POC          G-Codes - 2013/12/29 1355    Functional Assessment Tool Used Berg 40/56, TUG 57.85sec    Functional Limitation Mobility: Walking and moving around   Mobility: Walking and Moving Around Current Status ([id number]) At least 20 percent but less than 40 percent impaired, limited or restricted   Mobility: Walking and Moving Around Goal Status [phone]) At least 20 percent but less than 40 percent impaired, limited or restricted      Problem List Patient Active Problem List   Diagnosis Date Noted  . Spastic hemiplegia affecting nondominant side 10/27/2013  . Adhesive capsulitis of right shoulder 10/27/2013  . Chronic diastolic heart failure 58/52/7782   . Dyslipidemia 08/26/2013  . Borderline diabetes 08/26/2013  . CVA (cerebral infarction) 08/26/2013  . Acute CVA (cerebrovascular accident) 08/23/2013  . Pre-syncope 08/17/2012  . Hypokalemia 08/17/2012  . ARTHRITIS 03/28/2010  . DE QUERVAIN'S TENOSYNOVITIS 03/28/2010  . Normocytic anemia 07/26/2009  . REACTIVE AIRWAY DISEASE 03/09/2009  . DENTAL CARIES 03/09/2009  . CANDIDIASIS OF VULVA AND VAGINA 06/04/2008  . SKIN TAG 06/04/2008  . ARTHRITIS, KNEES, BILATERAL 05/22/2008  . HYPERTENSION, BENIGN ESSENTIAL 05/04/2008  .  KNEE PAIN, BILATERAL 05/04/2008  . COUGH 05/04/2008  . MENISCUS TEAR, RIGHT 12/07/2005                                            Henleigh Robello 12/10/2013, 1:57 PM

## 2013-12-12 ENCOUNTER — Ambulatory Visit: Payer: Medicare Other | Admitting: Occupational Therapy

## 2013-12-12 ENCOUNTER — Ambulatory Visit: Payer: Medicare Other | Admitting: Physical Therapy

## 2013-12-12 ENCOUNTER — Encounter: Payer: Self-pay | Admitting: Physical Therapy

## 2013-12-12 DIAGNOSIS — R6889 Other general symptoms and signs: Secondary | ICD-10-CM

## 2013-12-12 DIAGNOSIS — Z5189 Encounter for other specified aftercare: Secondary | ICD-10-CM | POA: Diagnosis not present

## 2013-12-12 DIAGNOSIS — R5381 Other malaise: Secondary | ICD-10-CM

## 2013-12-12 DIAGNOSIS — IMO0002 Reserved for concepts with insufficient information to code with codable children: Secondary | ICD-10-CM

## 2013-12-12 DIAGNOSIS — R269 Unspecified abnormalities of gait and mobility: Secondary | ICD-10-CM

## 2013-12-12 NOTE — Patient Instructions (Signed)
  Walking on Toes   Using counter top for balance: walk on toes forward and then backwards.  Repeat for 3 laps each way. Do this 1-2 times a day Copyright  VHI. All rights reserved.   FLEXION: Standing - Stable (Power)   At the countertop: High knee marching forward along the counter and then high knee marching backwards.   Repeat for 3 laps each way. Do this 1-2 times a day. Copyright  VHI. All rights reserved.   Walking on Heels   At counter top: walk on heels forward and then backwards.   Repeat for 3 laps each way. Do this 1-2 times a day.  Copyright  VHI. All rights reserved.   Feet Heel-Toe "Tandem"   At counter: use arms for support, walk a straight line bringing one foot directly in front of the other. Then walk a straight line backwards, one foot behind the other.  Repeat for 3 laps each way.  Do _1-2 sessions per day.  Copyright  VHI. All rights reserved.  Crossovers-with help only   At counter: Move to side: 1) cross right leg in front, then 2) bring back leg out to side. Repeat sequence in same direction. Reverse sequence, moving in opposite direction.  Repeat sequence _3___ laps each way. Do __1-2__ sessions per day.  Repeat on compliant surface: ________.  Copyright  VHI. All rights reserved.

## 2013-12-12 NOTE — Therapy (Signed)
Physical Therapy Treatment  Patient Details  Name: [name] MRN: [id number] Date of Birth: [birth year]  Encounter Date: 12/12/2013      PT End of Session - 12/12/13 1053    Visit Number 21   Number of Visits 33   Date for PT Re-Evaluation 01/09/14   PT Start Time 1018   PT Stop Time 1100   PT Time Calculation (min) 42 min   Equipment Utilized During Treatment Gait belt   Activity Tolerance Patient tolerated treatment well      Past Medical History  Diagnosis Date  . Hypertension   . Arthritis   . Stroke     Past Surgical History  Procedure Laterality Date  . Joint replacement    . Replacement total knee Left     There were no vitals taken for this visit.  Visit Diagnosis:  Weakness due to cerebrovascular accident  Debility  Activity intolerance  Abnormality of gait          [facility] Adult PT Treatment/Exercise - 12/12/13 1023    Transfers   Sit to Stand 6: Modified independent (Device/Increase time);Without upper extremity assist;From bed;With upper extremity assist  multiple reps throughout session   Stand to Sit 6: Modified independent (Device/Increase time);Without upper extremity assist;With upper extremity assist;To bed  multiple reps throughout session   Ambulation/Gait   Ambulation/Gait Yes   Ambulation/Gait Assistance 6: Modified independent (Device/Increase time)   Ambulation Distance (Feet) --  around gym with session   Assistive device Large base quad cane   Gait Pattern Step-through pattern;Decreased stride length;Decreased stance time - left;Decreased step length - right;Decreased dorsiflexion - left;Poor foot clearance - left   Gait velocity decreased   Stairs Yes   Stairs Assistance 6: Modified independent (Device/Increase time)   Stair Management Technique Step to pattern;One rail Left;Forwards;With cane  [facility] with right hand   Number of Stairs 4   Ramp 6: Modified independent (Device)   Ramp Details (indicate cue type and reason)  with large based quad cane [facility]). no cues or physical assistance needed   Curb 6: Modified independent (Device/increase time)   Curb Details (indicate cue type and reason) with [facility], no cues or physical assistance needed.   High Level Balance   High Level Balance Activities Side stepping;Tandem walking;Marching forwards;Marching backwards;Other (comment)  heel walk/toe walk both fwd/bwd, cross-overs fwd only eachwa   High Level Balance Comments Added new ex's for balance HEP. needed 1 UE support at countertop and cues on sequence/form and posture. Reviewed current HEP as well and provided needed cues on correct performance.                       Education - 12/12/13 1053    Education provided Yes   Education Details HEP   Education Details Patient   Methods Explanation;Handout   Comprehension Verbalized understanding;Returned demonstration;Verbal cues required          PT Short Term Goals - 12/12/13 1059    PT SHORT TERM GOAL #1   Title Reaches 5 inches without balance loss.   Time 0   Period Days  12/12/2013   Status Achieved   PT SHORT TERM GOAL #2   Title Timed up and go <34 seconds.   Time 0   Period Days   Status Achieved   PT SHORT TERM GOAL #3   Time 0   Period Days   Status Achieved   PT SHORT TERM GOAL #4   Title  Negotiate ramp and curb with LRAD and AFO with supervision.   Time 0   Period Days   Status Achieved          PT Long Term Goals - 12/12/13 1100    PT LONG TERM GOAL #1   Title Verbalize understanding of: risk factors and warning signs/symptoms of CVA   Time 4   Period Weeks  01/09/2014   Status Achieved   PT LONG TERM GOAL #2   Title Berg Balance >/= 40/56.   Time 4   Status Achieved   PT LONG TERM GOAL #3   Title Timed up and go <30 seconds.   Time 4   Period Weeks   Status Achieved   PT LONG TERM GOAL #4   Title Ambulates 300 feet with LRAD and AFO modified independent.   Period Weeks   Status Achieved   PT LONG TERM GOAL #5    Title Negotiate ramp, curb, and stairs with LRAD and AFO modified independent.   Time 4   Period Weeks   Status Achieved          Plan - 12/12/13 1058    Clinical Impression Statement Discharge today with goals met per PT POC.   Rehab Potential Good   PT Frequency Min 2X/week   PT Duration 8 weeks   PT Treatment/Interventions Therapeutic activities;Neuromuscular re-education;Gait training;Therapeutic exercise;Patient/family education;Other (comment)  orthotic fit/training, Hep, Self care/home mgt   PT Plan discharge PT today        Problem List Patient Active Problem List   Diagnosis Date Noted  . Spastic hemiplegia affecting nondominant side 10/27/2013  . Adhesive capsulitis of right shoulder 10/27/2013  . Chronic diastolic heart failure 79/03/4095  . Dyslipidemia 08/26/2013  . Borderline diabetes 08/26/2013  . CVA (cerebral infarction) 08/26/2013  . Acute CVA (cerebrovascular accident) 08/23/2013  . Pre-syncope 08/17/2012  . Hypokalemia 08/17/2012  . ARTHRITIS 03/28/2010  . DE QUERVAIN'S TENOSYNOVITIS 03/28/2010  . Normocytic anemia 07/26/2009  . REACTIVE AIRWAY DISEASE 03/09/2009  . DENTAL CARIES 03/09/2009  . CANDIDIASIS OF VULVA AND VAGINA 06/04/2008  . SKIN TAG 06/04/2008  . ARTHRITIS, KNEES, BILATERAL 05/22/2008  . HYPERTENSION, BENIGN ESSENTIAL 05/04/2008  . KNEE PAIN, BILATERAL 05/04/2008  . COUGH 05/04/2008  . MENISCUS TEAR, RIGHT 12/07/2005      Willow Ora 12/12/2013, 11:08 AM

## 2013-12-12 NOTE — Therapy (Signed)
Occupational Therapy Treatment  Patient Details  Name: [name] MRN: [id number] Date of Birth: 1950/04/19  Encounter Date: 12/12/2013      OT End of Session - 12/12/13 1139    Visit Number 21   Number of Visits 32   Date for OT Re-Evaluation 01/16/14   OT Start Time 0930   OT Stop Time 1014   OT Time Calculation (min) 44 min   Activity Tolerance Patient tolerated treatment well      Past Medical History  Diagnosis Date  . Hypertension   . Arthritis   . Stroke     Past Surgical History  Procedure Laterality Date  . Joint replacement    . Replacement total knee Left     There were no vitals taken for this visit.  Visit Diagnosis:  Weakness due to cerebrovascular accident  Lack of coordination due to stroke               Plan - 12/12/13 1140    Clinical Impression Statement Neuromuscular re-ed performed with emphasis on A/ROM, AA/ROM finger flexion/ extension, supination and wrist extension. AA/ROM/P/ROM shoulder flexion in supine, gentle weightbearing through left elbow with therapist facilliating trunk movement, weightbearing through tilted stool for AA/ROM shoulder flexion extension elbow extension, min facilliation.   Pt will benefit from skilled therapeutic intervention in order to improve on the following deficits Abnormal gait;Decreased activity tolerance;Decreased balance;Decreased cognition;Decreased range of motion;Increased edema;Impaired sensation;Impaired flexibility;Difficulty walking;Decreased mobility;Decreased strength;Impaired UE functional use;Impaired tone;Decreased safety awareness;Decreased coordination;Pain   Rehab Potential Good   Clinical Impairments Affecting Rehab Potential decreased body awareness, cognition   OT Frequency Min 2X/week   OT Duration 8 weeks   OT Treatment/Interventions Self-care/ADL training;Therapeutic exercise;Manual therapy;Modalities;Patient/family education;Neuromuscular education;Splinting;Balance  training;Energy conservation;Therapeutic activities;DME and/or AE instruction;Cognitive remediation/compensation   OT Plan check short term goals, neuro re-ed        Problem List Patient Active Problem List   Diagnosis Date Noted  . Spastic hemiplegia affecting nondominant side 10/27/2013  . Adhesive capsulitis of right shoulder 10/27/2013  . Chronic diastolic heart failure 70/62/3762  . Dyslipidemia 08/26/2013  . Borderline diabetes 08/26/2013  . CVA (cerebral infarction) 08/26/2013  . Acute CVA (cerebrovascular accident) 08/23/2013  . Pre-syncope 08/17/2012  . Hypokalemia 08/17/2012  . ARTHRITIS 03/28/2010  . DE QUERVAIN'S TENOSYNOVITIS 03/28/2010  . Normocytic anemia 07/26/2009  . REACTIVE AIRWAY DISEASE 03/09/2009  . DENTAL CARIES 03/09/2009  . CANDIDIASIS OF VULVA AND VAGINA 06/04/2008  . SKIN TAG 06/04/2008  . ARTHRITIS, KNEES, BILATERAL 05/22/2008  . HYPERTENSION, BENIGN ESSENTIAL 05/04/2008  . KNEE PAIN, BILATERAL 05/04/2008  . COUGH 05/04/2008  . MENISCUS TEAR, RIGHT 12/07/2005                                            RINE,KATHRYN 12/12/2013, 11:49 AM

## 2013-12-17 ENCOUNTER — Ambulatory Visit: Payer: Medicare Other

## 2013-12-17 ENCOUNTER — Ambulatory Visit: Payer: Medicare Other | Admitting: Occupational Therapy

## 2013-12-17 ENCOUNTER — Encounter: Payer: Self-pay | Admitting: Physical Therapy

## 2013-12-17 DIAGNOSIS — Z5189 Encounter for other specified aftercare: Secondary | ICD-10-CM | POA: Diagnosis not present

## 2013-12-17 DIAGNOSIS — IMO0002 Reserved for concepts with insufficient information to code with codable children: Secondary | ICD-10-CM

## 2013-12-17 NOTE — Therapy (Unsigned)
  Patient Details  Name: [name] MRN: [id number] Date of Birth: 1950/12/11  Encounter Date: 12/17/2013 PHYSICAL THERAPY DISCHARGE SUMMARY  Dates of Service: 09/19/13- 12/12/13 Visits from Start of Care: 21  Current functional level related to goals / functional outcomes:  PT LONG TERM GOAL #1    Title  Verbalize understanding of: risk factors and warning signs/symptoms of CVA    Time  4    Period  Weeks      01/09/2014    Status  Achieved    PT LONG TERM GOAL #2    Title  Berg Balance >/= 40/56.    Time  4    Status  Achieved  Berg  Balance 40/56    PT LONG TERM GOAL #3    Title  Timed up and go <30 seconds.    Time  4    Period  Weeks    Status  Achieved    PT LONG TERM GOAL #4    Title  Ambulates 300 feet with LRAD and AFO modified independent.    Period  Weeks    Status  Achieved    PT LONG TERM GOAL #5    Title  Negotiate ramp, curb, and stairs with LRAD and AFO modified independent.    Time  4    Period  Weeks    Status  Achieved                 Remaining deficits: This patient continues to have weakness associated with CVA. Patient is safe ambulating at basic community distance. She improved her Berg Balance from 26/56 to 40/56, however a score <45/56 indicates fall risk. Patient and family are satisfied with current level of progress.   Education / Equipment: Patient & family were instructed and verbalized/ return demonstrated understanding of CVA risk factors/ signs/ symptoms and HEP. Plan: Patient agrees to discharge.  Patient goals were met. Patient is being discharged due to meeting the stated rehab goals.and pleased with current level of function.        Precious Gilchrest PT 12/17/2013, 7:29 AM

## 2013-12-17 NOTE — Therapy (Signed)
Occupational Therapy Treatment  Patient Details  Name: [name] MRN: [id number] Date of Birth: March 06, 1950  Encounter Date: 12/17/2013      OT End of Session - 12/17/13 0958    Visit Number 22   Number of Visits 32   Date for OT Re-Evaluation 01/16/14   OT Start Time 0937   OT Stop Time 1018   OT Time Calculation (min) 41 min   Activity Tolerance Patient tolerated treatment well   Behavior During Therapy [facility] for tasks assessed/performed      Past Medical History  Diagnosis Date  . Hypertension   . Arthritis   . Stroke     Past Surgical History  Procedure Laterality Date  . Joint replacement    . Replacement total knee Left     There were no vitals taken for this visit.  Visit Diagnosis:  Lack of coordination due to stroke  Weakness due to cerebrovascular accident          OT Treatments/Exercises (OP) - 12/17/13 1041    Weight Bearing Technique   Weight Bearing Technique Yes  Weightbearing throught tilted stool min-mod facillitation   Manual Therapy   Manual Therapy Other (comment)            OT Short Term Goals - 12/17/13 0944    OT SHORT TERM GOAL #1   Title I with updated HEP   Time 4   Period Weeks   Status On-going   OT SHORT TERM GOAL #2   Title Pt will report ability to use LUE as a stabilizer 50% of the time for ADLs/ home management.   Time 1   Period Days   Status Achieved   OT SHORT TERM GOAL #3   Title Pt will demonstrate 50% composite finger flexion for grasping items.   Time 1   Period Days   Status Achieved   OT SHORT TERM GOAL #4   Title Pt. will demonstrate 25% composite finger extension in prep for functional use of LUE.   Time 4   Period Weeks   Status On-going          OT Long Term Goals - 12/17/13 6754    OT LONG TERM GOAL #1   Title Pt will demonstrate 40* A/ROM shoulder flexion in prep for forward reaching.   Time 4   Period Weeks   Status On-going   OT LONG TERM GOAL #2   Title Pt will report ability  to use LUE as a stabilizer/ gross A 75% of the time.   Time 4   Period Weeks   Status On-going   OT LONG TERM GOAL #3   Title Pt will perform basic cooking/ home management at a modified indpendent level.   Time 4   Period Weeks   Status On-going          Plan - 12/17/13 1046    Clinical Impression Statement supine  P/ROM shoulder flexion with scapular mobilization, AA/ROM elbow flexion/ extension, weightbearing throught mat with LUE while performing body on arm movement. Donning jacket with minv.c for technique   Rehab Potential Good   Clinical Impairments Affecting Rehab Potential decreased body awareness, cognition   OT Frequency 2x / week   OT Duration 8 weeks   OT Treatment/Interventions Self-care/ADL training;Moist Heat;Fluidtherapy;DME and/or AE instruction;Patient/family education;Therapeutic exercises;Therapeutic exercise;Therapeutic activities;Neuromuscular education;Cognitive remediation/compensation;Passive range of motion;Balance training   Plan neuro-re-education   OT Home Exercise Plan update/ progress HEP   Consulted and Agree with Plan  of Care Patient        Problem List Patient Active Problem List   Diagnosis Date Noted  . Spastic hemiplegia affecting nondominant side 10/27/2013  . Adhesive capsulitis of right shoulder 10/27/2013  . Chronic diastolic heart failure 37/62/8315  . Dyslipidemia 08/26/2013  . Borderline diabetes 08/26/2013  . CVA (cerebral infarction) 08/26/2013  . Acute CVA (cerebrovascular accident) 08/23/2013  . Pre-syncope 08/17/2012  . Hypokalemia 08/17/2012  . ARTHRITIS 03/28/2010  . DE QUERVAIN'S TENOSYNOVITIS 03/28/2010  . Normocytic anemia 07/26/2009  . REACTIVE AIRWAY DISEASE 03/09/2009  . DENTAL CARIES 03/09/2009  . CANDIDIASIS OF VULVA AND VAGINA 06/04/2008  . SKIN TAG 06/04/2008  . ARTHRITIS, KNEES, BILATERAL 05/22/2008  . HYPERTENSION, BENIGN ESSENTIAL 05/04/2008  . KNEE PAIN, BILATERAL 05/04/2008  . COUGH 05/04/2008  .  MENISCUS TEAR, RIGHT 12/07/2005                                              Mansur Patti 12/17/2013, 11:01 AM

## 2013-12-19 ENCOUNTER — Ambulatory Visit: Payer: Medicare Other | Admitting: Occupational Therapy

## 2013-12-19 ENCOUNTER — Ambulatory Visit: Payer: Medicare Other | Admitting: Physical Therapy

## 2013-12-19 DIAGNOSIS — Z5189 Encounter for other specified aftercare: Secondary | ICD-10-CM | POA: Diagnosis not present

## 2013-12-19 DIAGNOSIS — IMO0002 Reserved for concepts with insufficient information to code with codable children: Secondary | ICD-10-CM

## 2013-12-19 NOTE — Therapy (Signed)
Occupational Therapy Treatment  Patient Details  Name: [name] MRN: [id number] Date of Birth: 1950/06/28  Encounter Date: 12/19/2013      OT End of Session - 12/19/13 1417    Visit Number 23   Number of Visits 32   Date for OT Re-Evaluation 01/16/14   OT Start Time 0932   OT Stop Time 1015   OT Time Calculation (min) 43 min   Activity Tolerance Patient tolerated treatment well   Behavior During Therapy [facility] for tasks assessed/performed      Past Medical History  Diagnosis Date  . Hypertension   . Arthritis   . Stroke     Past Surgical History  Procedure Laterality Date  . Joint replacement    . Replacement total knee Left     There were no vitals taken for this visit.  Visit Diagnosis:  Lack of coordination due to stroke  Weakness due to cerebrovascular accident      Subjective Assessment - 12/19/13 1008    Currently in Pain? Yes   Pain Score 5    Pain Location Shoulder   Pain Descriptors / Indicators Aching;Tightness   Pain Type Chronic pain   Pain Onset More than a month ago   Pain Frequency Intermittent   Aggravating Factors  malpositioning   Pain Relieving Factors reposistioning   Effect of Pain on Daily Activities impacts exercise and bed positioning   Multiple Pain Sites No              OT Education - 12/19/13 1012    Education provided Yes   Education Details Pt was instructed only raise left arm wihtin pain free range when exercising in supine. Pt was instructed to continue previously issued HEP for AA/ROM/P/ROM   Person(s) Educated Patient   Methods Explanation   Comprehension Verbalized understanding              Plan - 12/19/13 1419    Clinical Impression Statement supine neuromuscular re-ed for AA/ROM shoulder flexion and triceps extension, sidelying for shoulder flexion, scapular mobilization/activation with min-mod facillitation. VMS to wrist and finger flexors and extensors, 50pps, 250 PW, 10 sec cylcle 2 sec ramp, int  flexors 21, extensors 19.5, x10 mins with pt/ or therapist facillitating wrist extion during on cycle. No adverse reactions.   Rehab Potential Good   Clinical Impairments Affecting Rehab Potential decreased body awareness, cognition   OT Frequency 2x / week   OT Duration 8 weeks   OT Treatment/Interventions Self-care/ADL training;Moist Heat;Fluidtherapy;DME and/or AE instruction;Patient/family education;Therapeutic exercises;Therapeutic exercise;Therapeutic activities;Neuromuscular education;Cognitive remediation/compensation;Passive range of motion;Balance training;Electrical Stimulation;Ultrasound;Manual Therapy   Plan neuro re-education   OT Home Exercise Plan continue with previosly issued HEP, therapist discussed with pt.        Problem List Patient Active Problem List   Diagnosis Date Noted  . Spastic hemiplegia affecting nondominant side 10/27/2013  . Adhesive capsulitis of right shoulder 10/27/2013  . Chronic diastolic heart failure 02/77/4128  . Dyslipidemia 08/26/2013  . Borderline diabetes 08/26/2013  . CVA (cerebral infarction) 08/26/2013  . Acute CVA (cerebrovascular accident) 08/23/2013  . Pre-syncope 08/17/2012  . Hypokalemia 08/17/2012  . ARTHRITIS 03/28/2010  . DE QUERVAIN'S TENOSYNOVITIS 03/28/2010  . Normocytic anemia 07/26/2009  . REACTIVE AIRWAY DISEASE 03/09/2009  . DENTAL CARIES 03/09/2009  . CANDIDIASIS OF VULVA AND VAGINA 06/04/2008  . SKIN TAG 06/04/2008  . ARTHRITIS, KNEES, BILATERAL 05/22/2008  . HYPERTENSION, BENIGN ESSENTIAL 05/04/2008  . KNEE PAIN, BILATERAL 05/04/2008  . COUGH 05/04/2008  .  MENISCUS TEAR, RIGHT 12/07/2005                                            Theone Murdoch, OTR/L  Ravindra Baranek 12/19/2013, 2:30 PM

## 2013-12-23 ENCOUNTER — Ambulatory Visit: Payer: Medicare Other | Admitting: Occupational Therapy

## 2013-12-23 ENCOUNTER — Ambulatory Visit: Payer: Medicare Other | Admitting: Physical Therapy

## 2013-12-23 DIAGNOSIS — IMO0002 Reserved for concepts with insufficient information to code with codable children: Secondary | ICD-10-CM

## 2013-12-23 DIAGNOSIS — Z5189 Encounter for other specified aftercare: Secondary | ICD-10-CM | POA: Diagnosis not present

## 2013-12-23 NOTE — Therapy (Signed)
Occupational Therapy Treatment  Patient Details  Name: [name] MRN: [id number] Date of Birth: 1950/12/31  Encounter Date: 12/23/2013      OT End of Session - 12/23/13 1137    Visit Number 24   Number of Visits 32   Date for OT Re-Evaluation 01/16/14   OT Start Time 0933   OT Stop Time 1015   OT Time Calculation (min) 42 min   Activity Tolerance Patient tolerated treatment well   Behavior During Therapy [facility] for tasks assessed/performed      Past Medical History  Diagnosis Date  . Hypertension   . Arthritis   . Stroke     Past Surgical History  Procedure Laterality Date  . Joint replacement    . Replacement total knee Left     There were no vitals taken for this visit.  Visit Diagnosis:  Lack of coordination due to stroke  Weakness due to cerebrovascular accident      Subjective Assessment - 12/23/13 0937    Currently in Pain? No/denies   Multiple Pain Sites No                    Plan - 12/23/13 1138    Clinical Impression Statement supine neuromuscular re-ed for AA/ROM shoulder flexion, and elbow flexion/ extension. Seated weightbearing activities through tilted stool, followed resisted AA/ROM low range shoulder flexion with focus on activiating the LUE, max facillitation. Seated at tabletop AA/ROM forearm supination, ponation and finger flexion/ extension   Rehab Potential Good   Clinical Impairments Affecting Rehab Potential decreased body awareness, cognition   OT Frequency 2x / week   OT Treatment/Interventions Self-care/ADL training;Moist Heat;Fluidtherapy;DME and/or AE instruction;Patient/family education;Therapeutic exercises;Therapeutic exercise;Therapeutic activities;Neuromuscular education;Cognitive remediation/compensation;Passive range of motion;Balance training;Electrical Stimulation;Ultrasound;Manual Therapy   Plan neuro re-ed   OT Home Exercise Plan continue with previously issued HEP, therapist discussed with pt.   Consulted and  Agree with Plan of Care Patient   Rehab Potential (ACUTE ONLY) Good   OT Frequency (ACUTE ONLY) Min 2X/week   OT Treatment/Interventions (ACUTE ONLY) Self-care/ADL training;Therapeutic exercise;Manual therapy;Modalities;Patient/family education;Neuromuscular education;Splinting;Balance training;Energy conservation;Therapeutic activities;DME and/or AE instruction;Cognitive remediation/compensation        Problem List Patient Active Problem List   Diagnosis Date Noted  . Spastic hemiplegia affecting nondominant side 10/27/2013  . Adhesive capsulitis of right shoulder 10/27/2013  . Chronic diastolic heart failure 38/93/7342  . Dyslipidemia 08/26/2013  . Borderline diabetes 08/26/2013  . CVA (cerebral infarction) 08/26/2013  . Acute CVA (cerebrovascular accident) 08/23/2013  . Pre-syncope 08/17/2012  . Hypokalemia 08/17/2012  . ARTHRITIS 03/28/2010  . DE QUERVAIN'S TENOSYNOVITIS 03/28/2010  . Normocytic anemia 07/26/2009  . REACTIVE AIRWAY DISEASE 03/09/2009  . DENTAL CARIES 03/09/2009  . CANDIDIASIS OF VULVA AND VAGINA 06/04/2008  . SKIN TAG 06/04/2008  . ARTHRITIS, KNEES, BILATERAL 05/22/2008  . HYPERTENSION, BENIGN ESSENTIAL 05/04/2008  . KNEE PAIN, BILATERAL 05/04/2008  . COUGH 05/04/2008  . MENISCUS TEAR, RIGHT 12/07/2005                                            [name], OTR/L  [name] 12/23/2013, 11:44 AM

## 2013-12-26 ENCOUNTER — Ambulatory Visit: Payer: Medicare Other | Admitting: Occupational Therapy

## 2013-12-26 ENCOUNTER — Ambulatory Visit: Payer: Medicare Other | Admitting: Physical Therapy

## 2013-12-26 DIAGNOSIS — IMO0002 Reserved for concepts with insufficient information to code with codable children: Secondary | ICD-10-CM

## 2013-12-26 DIAGNOSIS — Z5189 Encounter for other specified aftercare: Secondary | ICD-10-CM | POA: Diagnosis not present

## 2013-12-26 NOTE — Therapy (Signed)
Occupational Therapy Treatment  Patient Details  Name: [name] MRN: [id number] Date of Birth: 1950/02/13  Encounter Date: 12/26/2013      OT End of Session - 12/26/13 1356    Visit Number 25  24/30 G Need KX   Authorization Type Medicare Use KX!!!!!   OT Start Time [phone]   OT Stop Time 1017   OT Time Calculation (min) 39 min   Activity Tolerance Patient tolerated treatment well   Behavior During Therapy [facility] for tasks assessed/performed      Past Medical History  Diagnosis Date  . Hypertension   . Arthritis   . Stroke     Past Surgical History  Procedure Laterality Date  . Joint replacement    . Replacement total knee Left     There were no vitals taken for this visit.  Visit Diagnosis:  Lack of coordination due to stroke  Weakness due to cerebrovascular accident      Subjective Assessment - 12/26/13 1355    Currently in Pain? No/denies                    Plan - 12/26/13 1359    Clinical Impression Statement Fluidotherapy x 10 mins to LUE due to stiffness, no adverse reactions. Functional grasp / release of wooden dowel pegs with mod/ max facillitation, min v.c. to avoid compensation Weightbearing through tilted stool with body on arm movments and low range shoulder flexion/ exlow extension, min facillitation.   Rehab Potential Good   Clinical Impairments Affecting Rehab Potential decreased body awareness, cognition   OT Frequency 2x / week   OT Duration 8 weeks   OT Treatment/Interventions Self-care/ADL training;Moist Heat;Fluidtherapy;DME and/or AE instruction;Patient/family education;Therapeutic exercises;Therapeutic exercise;Therapeutic activities;Neuromuscular education;Cognitive remediation/compensation;Passive range of motion;Balance training;Electrical Stimulation;Ultrasound;Manual Therapy   Plan neuro re-ed   OT Home Exercise Plan continue with previously issued HEP   Consulted and Agree with Plan of Care Patient        Problem  List Patient Active Problem List   Diagnosis Date Noted  . Spastic hemiplegia affecting nondominant side 10/27/2013  . Adhesive capsulitis of right shoulder 10/27/2013  . Chronic diastolic heart failure 58/85/0277  . Dyslipidemia 08/26/2013  . Borderline diabetes 08/26/2013  . CVA (cerebral infarction) 08/26/2013  . Acute CVA (cerebrovascular accident) 08/23/2013  . Pre-syncope 08/17/2012  . Hypokalemia 08/17/2012  . ARTHRITIS 03/28/2010  . DE QUERVAIN'S TENOSYNOVITIS 03/28/2010  . Normocytic anemia 07/26/2009  . REACTIVE AIRWAY DISEASE 03/09/2009  . DENTAL CARIES 03/09/2009  . CANDIDIASIS OF VULVA AND VAGINA 06/04/2008  . SKIN TAG 06/04/2008  . ARTHRITIS, KNEES, BILATERAL 05/22/2008  . HYPERTENSION, BENIGN ESSENTIAL 05/04/2008  . KNEE PAIN, BILATERAL 05/04/2008  . COUGH 05/04/2008  . MENISCUS TEAR, RIGHT 12/07/2005                                             Theone Murdoch, OTR/L [phone] Phone: [phone] 2:16 PM 12/26/2013 Garrell Flagg 12/26/2013, 2:16 PM

## 2014-01-06 ENCOUNTER — Ambulatory Visit: Payer: Medicare Other | Attending: Physical Medicine & Rehabilitation | Admitting: Occupational Therapy

## 2014-01-06 DIAGNOSIS — R269 Unspecified abnormalities of gait and mobility: Secondary | ICD-10-CM | POA: Insufficient documentation

## 2014-01-06 DIAGNOSIS — M6281 Muscle weakness (generalized): Secondary | ICD-10-CM | POA: Insufficient documentation

## 2014-01-06 DIAGNOSIS — I69859 Hemiplegia and hemiparesis following other cerebrovascular disease affecting unspecified side: Secondary | ICD-10-CM | POA: Insufficient documentation

## 2014-01-06 DIAGNOSIS — Z5189 Encounter for other specified aftercare: Secondary | ICD-10-CM | POA: Insufficient documentation

## 2014-01-06 DIAGNOSIS — R5381 Other malaise: Secondary | ICD-10-CM | POA: Diagnosis not present

## 2014-01-06 DIAGNOSIS — R6889 Other general symptoms and signs: Secondary | ICD-10-CM

## 2014-01-06 DIAGNOSIS — IMO0002 Reserved for concepts with insufficient information to code with codable children: Secondary | ICD-10-CM

## 2014-01-06 NOTE — Therapy (Signed)
Occupational Therapy Treatment  Patient Details  Name: [name] MRN: [id number] Date of Birth: 1950-04-18  Encounter Date: 01/06/2014      OT End of Session - 01/06/14 1154    Visit Number 26   Number of Visits 32   Date for OT Re-Evaluation 01/16/14   Authorization Type Medicare Use KX!!!!!   OT Start Time 1106   OT Stop Time 1150   OT Time Calculation (min) 44 min   Activity Tolerance Patient tolerated treatment well   Behavior During Therapy [facility] for tasks assessed/performed      Past Medical History  Diagnosis Date  . Hypertension   . Arthritis   . Stroke     Past Surgical History  Procedure Laterality Date  . Joint replacement    . Replacement total knee Left     There were no vitals taken for this visit.  Visit Diagnosis:  Lack of coordination due to stroke  Weakness due to cerebrovascular accident  Activity intolerance      Subjective Assessment - 01/06/14 1109    Currently in Pain? No/denies   Multiple Pain Sites No                    Plan - 01/06/14 1418    Clinical Impression Statement Gentle P/ROM finger flexion/ extension and wrist extension, followed by VMS 50 pps, 250 pw, 10 secs cycle, 2 sec ramp alternately to wrist flexors/ extensors  (intensity:flexion 18.5, extension: 18) x 10 mins. Therapist facillitated AA/ROM during on cycles. Supine pt performed self ROM shoulder flexion, assisting LUE with RUE. AA/ROM triceps extension in supine, followed by gentle weightbearing through LUE edge of mat, min v.c.   Rehab Potential Good   Clinical Impairments Affecting Rehab Potential decreased body awareness, cognition   OT Frequency 2x / week   OT Duration 8 weeks   OT Treatment/Interventions Self-care/ADL training;Moist Heat;Fluidtherapy;DME and/or AE instruction;Patient/family education;Therapeutic exercises;Therapeutic exercise;Therapeutic activities;Neuromuscular education;Cognitive remediation/compensation;Passive range of  motion;Balance training;Electrical Stimulation;Ultrasound;Manual Therapy   Plan neuro re-ed   OT Home Exercise Plan continue with previously issued HEP   Consulted and Agree with Plan of Care Patient;Family member/caregiver        Problem List Patient Active Problem List   Diagnosis Date Noted  . Spastic hemiplegia affecting nondominant side 10/27/2013  . Adhesive capsulitis of right shoulder 10/27/2013  . Chronic diastolic heart failure 93/90/3009  . Dyslipidemia 08/26/2013  . Borderline diabetes 08/26/2013  . CVA (cerebral infarction) 08/26/2013  . Acute CVA (cerebrovascular accident) 08/23/2013  . Pre-syncope 08/17/2012  . Hypokalemia 08/17/2012  . ARTHRITIS 03/28/2010  . DE QUERVAIN'S TENOSYNOVITIS 03/28/2010  . Normocytic anemia 07/26/2009  . REACTIVE AIRWAY DISEASE 03/09/2009  . DENTAL CARIES 03/09/2009  . CANDIDIASIS OF VULVA AND VAGINA 06/04/2008  . SKIN TAG 06/04/2008  . ARTHRITIS, KNEES, BILATERAL 05/22/2008  . HYPERTENSION, BENIGN ESSENTIAL 05/04/2008  . KNEE PAIN, BILATERAL 05/04/2008  . COUGH 05/04/2008  . MENISCUS TEAR, RIGHT 12/07/2005                                              Shakiya Mcneary 01/06/2014, 2:25 PM  Theone Murdoch, OTR/L [phone] Phone: [phone] 2:25 PM 01/06/2014

## 2014-01-08 ENCOUNTER — Ambulatory Visit: Payer: Medicare Other | Admitting: Occupational Therapy

## 2014-01-08 DIAGNOSIS — Z5189 Encounter for other specified aftercare: Secondary | ICD-10-CM | POA: Diagnosis not present

## 2014-01-08 DIAGNOSIS — IMO0002 Reserved for concepts with insufficient information to code with codable children: Secondary | ICD-10-CM

## 2014-01-08 NOTE — Therapy (Signed)
[facility] [address] [place], [place], [address] Phone: [phone]   Fax:  [phone]  Occupational Therapy Treatment  Patient Details  Name: [name] MRN: [id number] Date of Birth: May 13, 1950  Encounter Date: 01/08/2014      OT End of Session - 01/08/14 0941    Visit Number 27   Date for OT Re-Evaluation 01/16/14   OT Start Time 0935   Behavior During Therapy [facility] for tasks assessed/performed      Past Medical History  Diagnosis Date  . Hypertension   . Arthritis   . Stroke     Past Surgical History  Procedure Laterality Date  . Joint replacement    . Replacement total knee Left     There were no vitals taken for this visit.  Visit Diagnosis:  Lack of coordination due to stroke  Weakness due to cerebrovascular accident      Subjective Assessment - 01/08/14 0941    Currently in Pain? No/denies                OT Short Term Goals - 01/08/14 1015    OT SHORT TERM GOAL #4   Title Pt. will demonstrate 25% composite finger extension in prep for functional use of LUE.   Time 1   Period Weeks   Status On-going          OT Long Term Goals - 01/08/14 1015    OT LONG TERM GOAL #1   Title Pt will demonstrate 40* A/ROM shoulder flexion in prep for forward reaching.   Time 1   Period Weeks   Status On-going   OT LONG TERM GOAL #2   Title Pt will report ability to use LUE as a stabilizer/ gross A 75% of the time.   Time 1   Period Weeks   Status On-going   OT LONG TERM GOAL #3   Title Pt will perform basic cooking/ home management at a modified indpendent level.   Time 1   Period Weeks   Status On-going          Plan - 01/08/14 1007    Clinical Impression Statement Supine self ROM shoulder flexion followed by AA/ROM triceps extension, then scapular retraction, min v.c./ min facillitation Seated weightbearing through left elbow and hand while performing functional reaching/ trunk rotation  with RUE for body on arm movements, min-facillitation. weight bearing through tilted stool with emphasis on controlled movements, min facillitation/ v.c and therapist providing gentle resistance   Rehab Potential Good   Clinical Impairments Affecting Rehab Potential decreased body awareness, cognition   OT Frequency 2x / week   OT Duration 8 weeks   OT Treatment/Interventions Self-care/ADL training;Moist Heat;Fluidtherapy;DME and/or AE instruction;Patient/family education;Therapeutic exercises;Therapeutic exercise;Therapeutic activities;Neuromuscular education;Cognitive remediation/compensation;Passive range of motion;Balance training;Electrical Stimulation;Ultrasound;Manual Therapy   Plan check goals, anticipate d/c next week, FOTO, G code   Consulted and Agree with Plan of Care Patient                               Problem List Patient Active Problem List   Diagnosis Date Noted  . Spastic hemiplegia affecting nondominant side 10/27/2013  . Adhesive capsulitis of right shoulder 10/27/2013  . Chronic diastolic heart failure 14/43/1540  . Dyslipidemia 08/26/2013  . Borderline diabetes 08/26/2013  . CVA (cerebral infarction) 08/26/2013  . Acute CVA (cerebrovascular accident) 08/23/2013  . Pre-syncope 08/17/2012  . Hypokalemia 08/17/2012  . ARTHRITIS 03/28/2010  .  DE QUERVAIN'S TENOSYNOVITIS 03/28/2010  . Normocytic anemia 07/26/2009  . REACTIVE AIRWAY DISEASE 03/09/2009  . DENTAL CARIES 03/09/2009  . CANDIDIASIS OF VULVA AND VAGINA 06/04/2008  . SKIN TAG 06/04/2008  . ARTHRITIS, KNEES, BILATERAL 05/22/2008  . HYPERTENSION, BENIGN ESSENTIAL 05/04/2008  . KNEE PAIN, BILATERAL 05/04/2008  . COUGH 05/04/2008  . MENISCUS TEAR, RIGHT 12/07/2005    Taiyana Kissler 01/08/2014, 10:28 AM Theone Murdoch, OTR/L [phone] Phone: [phone] 10:28 AM 01/08/2014

## 2014-01-13 ENCOUNTER — Ambulatory Visit: Payer: Medicare Other | Admitting: Occupational Therapy

## 2014-01-13 DIAGNOSIS — Z5189 Encounter for other specified aftercare: Secondary | ICD-10-CM | POA: Diagnosis not present

## 2014-01-13 DIAGNOSIS — IMO0002 Reserved for concepts with insufficient information to code with codable children: Secondary | ICD-10-CM

## 2014-01-13 NOTE — Therapy (Signed)
[facility] [address] [place], [place], [address] Phone: [phone]   Fax:  [phone]  Occupational Therapy Treatment  Patient Details  Name: [name] MRN: [id number] Date of Birth: [birth year]  Encounter Date: 01/13/2014      OT End of Session - 01/13/14 1119    Visit Number 28   Number of Visits 32   Date for OT Re-Evaluation 01/16/14   Authorization Type Medicare Use KX!!!!!   OT Start Time 1104   OT Stop Time 1145   OT Time Calculation (min) 41 min   Activity Tolerance Patient tolerated treatment well   Behavior During Therapy [facility] for tasks assessed/performed      Past Medical History  Diagnosis Date  . Hypertension   . Arthritis   . Stroke     Past Surgical History  Procedure Laterality Date  . Joint replacement    . Replacement total knee Left     There were no vitals taken for this visit.  Visit Diagnosis:  Lack of coordination due to stroke  Weakness due to cerebrovascular accident      Subjective Assessment - 01/13/14 1108    Currently in Pain? No/denies                OT Short Term Goals - 01/13/14 1704    OT SHORT TERM GOAL #1   Title I with updated HEP   Status On-going   OT SHORT TERM GOAL #2   Title Pt will report ability to use LUE as a stabilizer 50% of the time for ADLs/ home management.   Baseline Pt reports using her LUE to assist grossly 80% of the time.   Status Achieved   OT SHORT TERM GOAL #3   Title Pt will demonstrate 50% composite finger flexion for grasping items.   Status On-going   OT SHORT TERM GOAL #4   Title Pt. will demonstrate 25% composite finger extension in prep for functional use of LUE.   Status On-going            Plan - 01/13/14 1702    Clinical Impression Statement Pt is progressing towards goals, limited by significant left hemiplegia.   Rehab Potential Good   OT Frequency 2x / week   OT Duration 8 weeks   OT Treatment/Interventions  Self-care/ADL training;Moist Heat;Fluidtherapy;DME and/or AE instruction;Patient/family education;Therapeutic exercises;Therapeutic exercise;Therapeutic activities;Neuromuscular education;Cognitive remediation/compensation;Passive range of motion;Balance training;Electrical Stimulation;Ultrasound;Manual Therapy   Plan Finish checking goals, d/c G-code      Therapist started checking progress towards goals. VMS 50 pps 250 pw, 10 secs cycle intensity 17 for wrist extensors/ flexors alternately x 25 mins with therapist facillitating movement during on cycle, followed by grasp/ release of cylindrical pegs with mod facillitation to LUE forearm, followed by AA/ROM shoulder flexion along tabletop with towel min v.c. facillitation.  FOTO completed following session: primary score: 66, hand function score: 15%                         Problem List Patient Active Problem List   Diagnosis Date Noted  . Spastic hemiplegia affecting nondominant side 10/27/2013  . Adhesive capsulitis of right shoulder 10/27/2013  . Chronic diastolic heart failure 26/71/2458  . Dyslipidemia 08/26/2013  . Borderline diabetes 08/26/2013  . CVA (cerebral infarction) 08/26/2013  . Acute CVA (cerebrovascular accident) 08/23/2013  . Pre-syncope 08/17/2012  . Hypokalemia 08/17/2012  . ARTHRITIS 03/28/2010  . DE QUERVAIN'S TENOSYNOVITIS 03/28/2010  . Normocytic  anemia 07/26/2009  . REACTIVE AIRWAY DISEASE 03/09/2009  . DENTAL CARIES 03/09/2009  . CANDIDIASIS OF VULVA AND VAGINA 06/04/2008  . SKIN TAG 06/04/2008  . ARTHRITIS, KNEES, BILATERAL 05/22/2008  . HYPERTENSION, BENIGN ESSENTIAL 05/04/2008  . KNEE PAIN, BILATERAL 05/04/2008  . COUGH 05/04/2008  . MENISCUS TEAR, RIGHT 12/07/2005    Imre Vecchione 01/13/2014, 5:06 PM .Theone Murdoch, OTR/L [phone] Phone: [phone] 5:07 PM 01/13/2014

## 2014-01-16 ENCOUNTER — Ambulatory Visit: Payer: Medicare Other | Admitting: Occupational Therapy

## 2014-01-16 DIAGNOSIS — IMO0002 Reserved for concepts with insufficient information to code with codable children: Secondary | ICD-10-CM

## 2014-01-16 DIAGNOSIS — Z5189 Encounter for other specified aftercare: Secondary | ICD-10-CM | POA: Diagnosis not present

## 2014-01-16 NOTE — Therapy (Signed)
[facility] [address] [place], [place], [address] Phone: [phone]   Fax:  [phone]  Occupational Therapy Treatment  Patient Details  Name: [name] MRN: [id number] Date of Birth: 07-31-1950  Encounter Date: 01/28/2014      OT End of Session - 2014/01/28 1107    Visit Number 29   Number of Visits 32   Date for OT Re-Evaluation 28-Jan-2014   Authorization Type Medicare Use KX!!!!!   OT Start Time 1102   OT Stop Time 1147   OT Time Calculation (min) 45 min   Activity Tolerance Patient tolerated treatment well   Behavior During Therapy [facility] for tasks assessed/performed      Past Medical History  Diagnosis Date  . Hypertension   . Arthritis   . Stroke     Past Surgical History  Procedure Laterality Date  . Joint replacement    . Replacement total knee Left     There were no vitals taken for this visit.  Visit Diagnosis:  Lack of coordination due to stroke  Weakness due to cerebrovascular accident      Subjective Assessment - 01-28-14 1106    Currently in Pain? No/denies              OT Education - Jan 28, 2014 1300    Education Details Final HEP   Person(s) Educated Patient   Methods Explanation   Comprehension Verbalized understanding          OT Short Term Goals - 2014/01/28 1115    OT SHORT TERM GOAL #1   Title I with updated HEP   Baseline Pt verbalizes understanding.   Status Achieved   OT SHORT TERM GOAL #2   Title Pt will report ability to use LUE as a stabilizer 50% of the time for ADLs/ home management.   Status Achieved   OT SHORT TERM GOAL #3   Title Pt will demonstrate 50% composite finger flexion for grasping items.   Baseline Pt demonstrates grossly 50% composite finer flexion   Status Achieved   OT SHORT TERM GOAL #4   Title Pt. will demonstrate 25% composite finger extension in prep for functional use of LUE.   Baseline Pt demonstrate 25-30 % composite finger flexion   Status Achieved          OT Long Term Goals - 28-Jan-2014 1120    OT LONG TERM GOAL #1   Title Pt will demonstrate 40* A/ROM shoulder flexion in prep for forward reaching.   Baseline grossly 30* shoulder flexion/ scaption.   Status Not Met   OT LONG TERM GOAL #2   Title Pt will report ability to use LUE as a stabilizer/ gross A 75% of the time.   Baseline Pt reports using 80% of the time.   Status Achieved   OT LONG TERM GOAL #3   Title Pt will perform basic cooking/ home management at a modified indpendent level.   Baseline Pt demonstrate ability to perform   Status Achieved            G-Codes - Jan 28, 2014 1309    Functional Assessment Tool Used self care, uses  LUE as stabilizer / gross A for ADLS, yet still demonstrates difficulty with donning shoe/ sock   Functional Limitation Self care   Self Care Goal Status (A6301) At least 1 percent but less than 20 percent impaired, limited or restricted   Self Care Discharge Status (S0109) At least 20 percent but less than 40 percent  impaired, limited or restricted        Checked progress towards goals. Pt performed basic cooking task modified independently to cook egg, demonstrating good safety awareness. Supine self ROM shoulder flexion with RUE assisting LUE. Therapist reviewed final HEP with paitent. Pt verbalized/ or returned demonstration of all items. Pt practiced picking up small bottles with LUE with assistance for forearm elbow movement, min v.c. FOTO score : 66(overall), Hand function:15%     OCCUPATIONAL THERAPY DISCHARGE SUMMARY  Current functional level related to goals / functional outcomes: Pt made excellent overall progress achieving 2/3 long term goals. PT did not fully meet shoulder A/ROM goal as progress was slower than anticipated.   Remaining deficits: Decreased strength, decreased coordination, decreased balance.   Education / Equipment: Pt was educated regarding the following: CVA warning signs/ symptoms,  and HEP. Pt verbalized understanding of all education.  Plan: Patient agrees to discharge.  Patient goals were partially met. Patient is being discharged due to meeting the stated rehab goals.  ???                          Problem List Patient Active Problem List   Diagnosis Date Noted  . Spastic hemiplegia affecting nondominant side 10/27/2013  . Adhesive capsulitis of right shoulder 10/27/2013  . Chronic diastolic heart failure 24/10/7351  . Dyslipidemia 08/26/2013  . Borderline diabetes 08/26/2013  . CVA (cerebral infarction) 08/26/2013  . Acute CVA (cerebrovascular accident) 08/23/2013  . Pre-syncope 08/17/2012  . Hypokalemia 08/17/2012  . ARTHRITIS 03/28/2010  . DE QUERVAIN'S TENOSYNOVITIS 03/28/2010  . Normocytic anemia 07/26/2009  . REACTIVE AIRWAY DISEASE 03/09/2009  . DENTAL CARIES 03/09/2009  . CANDIDIASIS OF VULVA AND VAGINA 06/04/2008  . SKIN TAG 06/04/2008  . ARTHRITIS, KNEES, BILATERAL 05/22/2008  . HYPERTENSION, BENIGN ESSENTIAL 05/04/2008  . KNEE PAIN, BILATERAL 05/04/2008  . COUGH 05/04/2008  . MENISCUS TEAR, RIGHT 12/07/2005    Anisah Kuck 01/16/2014, 1:13 PM Theone Murdoch, OTR/L [phone] Phone: [phone] 1:14 PM 01/16/2014

## 2014-01-16 NOTE — Patient Instructions (Signed)
    Home exercise program:  Laying down clasp hands together and reach for the ceiling, 10-20 reps 1-2 x day Seated at table practice turning left palm up help with your right hand 10-20, 1-2 x day. Practice grasping and releasing small objects with your left hand, 10 mins 1x day Perform table slides with left arm on a towel slide forward and backwards, 10 reps 2 x day  Theone Murdoch, OTR/L [phone] Phone: [phone] 11:46 AM 01/16/2014

## 2014-01-20 ENCOUNTER — Encounter: Payer: Medicare Other | Admitting: Occupational Therapy

## 2014-01-22 ENCOUNTER — Encounter: Payer: Medicare Other | Admitting: Occupational Therapy

## 2014-02-23 ENCOUNTER — Ambulatory Visit: Payer: Self-pay

## 2014-02-24 ENCOUNTER — Ambulatory Visit: Payer: Self-pay | Admitting: Neurology

## 2014-08-11 DIAGNOSIS — M130 Polyarthritis, unspecified: Secondary | ICD-10-CM | POA: Diagnosis not present

## 2014-08-11 DIAGNOSIS — I10 Essential (primary) hypertension: Secondary | ICD-10-CM | POA: Diagnosis not present

## 2014-08-11 DIAGNOSIS — I6300 Cerebral infarction due to thrombosis of unspecified precerebral artery: Secondary | ICD-10-CM | POA: Diagnosis not present

## 2014-08-11 DIAGNOSIS — E6609 Other obesity due to excess calories: Secondary | ICD-10-CM | POA: Diagnosis not present

## 2014-08-18 DIAGNOSIS — I6789 Other cerebrovascular disease: Secondary | ICD-10-CM | POA: Diagnosis not present

## 2014-09-18 DIAGNOSIS — I6789 Other cerebrovascular disease: Secondary | ICD-10-CM | POA: Diagnosis not present

## 2014-10-19 DIAGNOSIS — I6789 Other cerebrovascular disease: Secondary | ICD-10-CM | POA: Diagnosis not present

## 2014-11-18 DIAGNOSIS — I6789 Other cerebrovascular disease: Secondary | ICD-10-CM | POA: Diagnosis not present

## 2014-12-12 ENCOUNTER — Emergency Department (HOSPITAL_COMMUNITY): Payer: Commercial Managed Care - HMO

## 2014-12-12 ENCOUNTER — Emergency Department (HOSPITAL_COMMUNITY)
Admission: EM | Admit: 2014-12-12 | Discharge: 2014-12-12 | Disposition: A | Discharge status: Home / Self Care | Payer: Commercial Managed Care - HMO | Attending: Emergency Medicine | Admitting: Emergency Medicine

## 2014-12-12 ENCOUNTER — Encounter (HOSPITAL_COMMUNITY): Payer: Self-pay | Admitting: Emergency Medicine

## 2014-12-12 DIAGNOSIS — Z8673 Personal history of transient ischemic attack (TIA), and cerebral infarction without residual deficits: Secondary | ICD-10-CM | POA: Diagnosis not present

## 2014-12-12 DIAGNOSIS — M1990 Unspecified osteoarthritis, unspecified site: Secondary | ICD-10-CM | POA: Insufficient documentation

## 2014-12-12 DIAGNOSIS — M25551 Pain in right hip: Secondary | ICD-10-CM | POA: Insufficient documentation

## 2014-12-12 DIAGNOSIS — Z79899 Other long term (current) drug therapy: Secondary | ICD-10-CM | POA: Insufficient documentation

## 2014-12-12 DIAGNOSIS — M47816 Spondylosis without myelopathy or radiculopathy, lumbar region: Secondary | ICD-10-CM | POA: Diagnosis not present

## 2014-12-12 DIAGNOSIS — M545 Low back pain: Secondary | ICD-10-CM | POA: Diagnosis not present

## 2014-12-12 DIAGNOSIS — Z7982 Long term (current) use of aspirin: Secondary | ICD-10-CM | POA: Diagnosis not present

## 2014-12-12 DIAGNOSIS — I10 Essential (primary) hypertension: Secondary | ICD-10-CM | POA: Insufficient documentation

## 2014-12-12 MED ORDER — OXYCODONE-ACETAMINOPHEN 5-325 MG PO TABS
1.0000 | ORAL_TABLET | Freq: Once | ORAL | Status: AC
  Administered 2014-12-12: 1 via ORAL
  Filled 2014-12-12: qty 1

## 2014-12-12 MED ORDER — HYDROCODONE-ACETAMINOPHEN 5-325 MG PO TABS: 1.0000 | ORAL_TABLET | ORAL | Status: DC | PRN

## 2014-12-12 NOTE — ED Notes (Signed)
Pt. Stated, when I bent over yestreday and raided back up i felt a catch in my rt. Hip.  Today the pain is still there.

## 2014-12-12 NOTE — ED Provider Notes (Signed)
CSN: [id number]     Arrival date & time 12/12/14  1323 History   First MD Initiated Contact with Patient 12/12/14 1353     Chief Complaint  Patient presents with  . Hip Pain     (Consider location/radiation/quality/duration/timing/severity/associated sxs/prior Treatment) HPI [name] is a 64 y.o. female with history of hypertension, CVA, arthritis, presents to emergency department complaining of right hip and lower back pain. Patient states she bent over yesterday and when tried to get up she developed sharp pain in the right lower back that radiates into the right hip. Since then difficulty ambulating and bearing weight on that hip. Patient reports stroke one year ago, since then has decreased use and left leg, weakness, numbness, states when she walks she has to drag her left leg. She ambulates with a walker. She denies any prior right hip problems. She does report history of lower back pain. She denies any numbness or weakness in right lower leg. She did not take any pain medications prior to coming in. She denies any injuries or falls. No problems controlling her bladder or bowels. Denies any fever or chills. No urinary symptoms.  Past Medical History  Diagnosis Date  . Hypertension   . Arthritis   . Stroke [facility])    Past Surgical History  Procedure Laterality Date  . Joint replacement    . Replacement total knee Left    No family history on file. Social History  Substance Use Topics  . Smoking status: Never Smoker   . Smokeless tobacco: None  . Alcohol Use: No   OB History    No data available     Review of Systems  Constitutional: Negative for fever and chills.  Gastrointestinal: Negative for abdominal pain.  Genitourinary: Negative for dysuria, urgency, hematuria and difficulty urinating.  Musculoskeletal: Positive for myalgias, back pain, arthralgias and gait problem.      Allergies  Review of patient's allergies indicates no known allergies.  Home  Medications   Prior to Admission medications   Medication Sig Start Date End Date Taking? Authorizing Provider  aspirin 81 MG EC tablet Take 1 tablet (81 mg total) by mouth daily. 09/18/13   Lavon Paganini Angiulli, PA-C  atorvastatin (LIPITOR) 40 MG tablet Take 1 tablet (40 mg total) by mouth daily at 6 PM. 09/18/13   Lavon Paganini Angiulli, PA-C  losartan (COZAAR) 50 MG tablet Take 50 mg by mouth daily.    Historical Provider, MD  metoprolol tartrate (LOPRESSOR) 12.5 mg TABS tablet Take 0.5 tablets (12.5 mg total) by mouth 2 (two) times daily. 09/18/13   Lavon Paganini Angiulli, PA-C  research study medication Take 75 mg by mouth daily with breakfast. 09/18/13   Lavon Paganini Angiulli, PA-C   BP 115/71 mmHg  Pulse 67  Temp(Src) 98 F (36.7 C)  Resp 17  Ht 5\' 3"  (1.6 m)  Wt 230 lb (104.327 kg)  BMI 40.75 kg/m2  SpO2 97% Physical Exam  Constitutional: She appears well-developed and well-nourished. No distress.  HENT:  Head: Normocephalic.  Eyes: Conjunctivae are normal.  Neck: Neck supple.  Cardiovascular: Normal rate, regular rhythm and normal heart sounds.   Pulmonary/Chest: Effort normal and breath sounds normal. No respiratory distress. She has no wheezes. She has no rales.  Musculoskeletal: She exhibits no edema.  No midline lumbar spine tenderness. No tenderness in the right SI joint or right buttock. No tenderness to palpation over hip joint. Pain with right straight leg raise. Pain with internal/external rotation of  the right hip. Full range of motion with passive flexion of the hip, limited range of motion with active flexion of the right hip. Right knee is normal. Dorsal pedal pulses intact and equal bilaterally.  Neurological: She is alert.  Skin: Skin is warm and dry.  Psychiatric: She has a normal mood and affect. Her behavior is normal.  Nursing note and vitals reviewed.   ED Course  Procedures (including critical care time) Labs Review Labs Reviewed - No data to display  Imaging  Review Dg Lumbar Spine Complete  12/12/2014  CLINICAL DATA:  64 year old female with posterior right hip pain, no known injury. EXAM: LUMBAR SPINE - COMPLETE 4+ VIEW COMPARISON:  Concurrently obtained radiographs of the pelvis and right hip FINDINGS: There is no evidence of lumbar spine fracture. Alignment is normal. Multilevel degenerative disc disease most significant at L5-S1. L5-S1 facet arthropathy as well. Osteitis pubis noted incidentally. Bowel gas pattern is unremarkable. IMPRESSION: 1. No acute fracture or malalignment. 2. Lower lumbar degenerative disc disease and facet arthropathy. Electronically Signed   By: Jacqulynn Cadet M.D.   On: 12/12/2014 14:57   Dg Hip Unilat With Pelvis 2-3 Views Right  12/12/2014  CLINICAL DATA:  64 year old female with a history of right hip pain EXAM: DG HIP (WITH OR WITHOUT PELVIS) 2-3V RIGHT COMPARISON:  None. FINDINGS: Bony pelvic ring appears intact. Bilateral hips projects normally over the acetabula. Unremarkable appearance of proximal right femur. No acute fracture line identified. Mild degenerative changes bilateral hips. Sclerotic changes at the pubic symphysis. Pelvic phleboliths IMPRESSION: No acute bony abnormality. Signed, Dulcy Fanny. Earleen Newport, DO [facility] Electronically Signed   By: Corrie Mckusick D.O.   On: 12/12/2014 14:56   I have personally reviewed and evaluated these images and lab results as part of my medical decision-making.   EKG Interpretation None      MDM   Final diagnoses:  Right hip pain    patient with history of CVA and left leg weakness for one year, presents with pain in the lower back radiating into the right hip. No injuries. Exam most consistent with radicular pain from lower back. Also could be from compensation given left leg weakness. Will try Percocet for pain, imaging of the lumbar spine and right hip ordered. Pt is neurovascularly intact.   3:12 PM Pt's  xray showing arthritic changes in lumbar spine, otherwise unremarkable. Pt's pain improved with percocet. She was able to ambulate up and down the hallway with a cane with no assistance. She feels stable on her feet. She now admits to doing yard work and pulling flowers in her yard and believes she over did it. Home with  Norco for pain. Follow up with pcp.   Filed Vitals:   12/12/14 1341  BP: 115/71  Pulse: 67  Temp: 98 F (36.7 C)  Resp: 17  Height: 5\' 3"  (1.6 m)  Weight: 230 lb (104.327 kg)  SpO2: 97%     Jeannett Senior, PA-C 12/12/14 2139  Blanchie Dessert, MD 12/13/14 [phone]

## 2014-12-12 NOTE — Discharge Instructions (Signed)
Take pain medications as prescribed. Avoid bending over or any strenuous activity. Follow-up with primary care doctor. Return if worsening symptoms.  Hip Pain Your hip is the joint between your upper legs and your lower pelvis. The bones, cartilage, tendons, and muscles of your hip joint perform a lot of work each day supporting your body weight and allowing you to move around. Hip pain can range from a minor ache to severe pain in one or both of your hips. Pain may be felt on the inside of the hip joint near the groin, or the outside near the buttocks and upper thigh. You may have swelling or stiffness as well.  HOME CARE INSTRUCTIONS   Take medicines only as directed by your health care provider.  Apply ice to the injured area:  Put ice in a plastic bag.  Place a towel between your skin and the bag.  Leave the ice on for 15-20 minutes at a time, 3-4 times a day.  Keep your leg raised (elevated) when possible to lessen swelling.  Avoid activities that cause pain.  Follow specific exercises as directed by your health care provider.  Sleep with a pillow between your legs on your most comfortable side.  Record how often you have hip pain, the location of the pain, and what it feels like. SEEK MEDICAL CARE IF:   You are unable to put weight on your leg.  Your hip is red or swollen or very tender to touch.  Your pain or swelling continues or worsens after 1 week.  You have increasing difficulty walking.  You have a fever. SEEK IMMEDIATE MEDICAL CARE IF:   You have fallen.  You have a sudden increase in pain and swelling in your hip. MAKE SURE YOU:   Understand these instructions.  Will watch your condition.  Will get help right away if you are not doing well or get worse.   This information is not intended to replace advice given to you by your health care provider. Make sure you discuss any questions you have with your health care provider.   Document Released:  07/13/2009 Document Revised: 02/13/2014 Document Reviewed: 09/19/2012 Elsevier Interactive Patient Education [employer].

## 2015-01-28 DIAGNOSIS — I5032 Chronic diastolic (congestive) heart failure: Secondary | ICD-10-CM | POA: Diagnosis not present

## 2015-01-28 DIAGNOSIS — Z6841 Body Mass Index (BMI) 40.0 and over, adult: Secondary | ICD-10-CM | POA: Diagnosis not present

## 2015-01-28 DIAGNOSIS — Z0000 Encounter for general adult medical examination without abnormal findings: Secondary | ICD-10-CM | POA: Diagnosis not present

## 2015-01-28 DIAGNOSIS — I69944 Monoplegia of lower limb following unspecified cerebrovascular disease affecting left non-dominant side: Secondary | ICD-10-CM | POA: Diagnosis not present

## 2015-01-28 DIAGNOSIS — I110 Hypertensive heart disease with heart failure: Secondary | ICD-10-CM | POA: Diagnosis not present

## 2015-02-09 DIAGNOSIS — M130 Polyarthritis, unspecified: Secondary | ICD-10-CM | POA: Diagnosis not present

## 2015-02-09 DIAGNOSIS — J398 Other specified diseases of upper respiratory tract: Secondary | ICD-10-CM | POA: Diagnosis not present

## 2015-02-09 DIAGNOSIS — I10 Essential (primary) hypertension: Secondary | ICD-10-CM | POA: Diagnosis not present

## 2015-02-16 DIAGNOSIS — J069 Acute upper respiratory infection, unspecified: Secondary | ICD-10-CM | POA: Diagnosis not present

## 2015-02-28 ENCOUNTER — Emergency Department (HOSPITAL_COMMUNITY): Payer: Commercial Managed Care - HMO

## 2015-02-28 ENCOUNTER — Emergency Department (HOSPITAL_COMMUNITY)
Admission: EM | Admit: 2015-02-28 | Discharge: 2015-02-28 | Disposition: A | Discharge status: Home / Self Care | Payer: Commercial Managed Care - HMO | Attending: Emergency Medicine | Admitting: Emergency Medicine

## 2015-02-28 ENCOUNTER — Encounter (HOSPITAL_COMMUNITY): Payer: Self-pay | Admitting: *Deleted

## 2015-02-28 DIAGNOSIS — K8070 Calculus of gallbladder and bile duct without cholecystitis without obstruction: Secondary | ICD-10-CM | POA: Diagnosis not present

## 2015-02-28 DIAGNOSIS — K8020 Calculus of gallbladder without cholecystitis without obstruction: Secondary | ICD-10-CM | POA: Diagnosis not present

## 2015-02-28 DIAGNOSIS — Z7982 Long term (current) use of aspirin: Secondary | ICD-10-CM | POA: Diagnosis not present

## 2015-02-28 DIAGNOSIS — Z79899 Other long term (current) drug therapy: Secondary | ICD-10-CM | POA: Diagnosis not present

## 2015-02-28 DIAGNOSIS — M1990 Unspecified osteoarthritis, unspecified site: Secondary | ICD-10-CM | POA: Diagnosis not present

## 2015-02-28 DIAGNOSIS — D649 Anemia, unspecified: Secondary | ICD-10-CM

## 2015-02-28 DIAGNOSIS — Z8673 Personal history of transient ischemic attack (TIA), and cerebral infarction without residual deficits: Secondary | ICD-10-CM | POA: Diagnosis not present

## 2015-02-28 DIAGNOSIS — K8050 Calculus of bile duct without cholangitis or cholecystitis without obstruction: Secondary | ICD-10-CM

## 2015-02-28 DIAGNOSIS — R079 Chest pain, unspecified: Secondary | ICD-10-CM | POA: Diagnosis not present

## 2015-02-28 DIAGNOSIS — R1013 Epigastric pain: Secondary | ICD-10-CM | POA: Diagnosis present

## 2015-02-28 DIAGNOSIS — I10 Essential (primary) hypertension: Secondary | ICD-10-CM | POA: Diagnosis not present

## 2015-02-28 LAB — HEPATIC FUNCTION PANEL
ALBUMIN: 3.4 g/dL — AB (ref 3.5–5.0)
ALK PHOS: 56 U/L (ref 38–126)
ALT: 25 U/L (ref 14–54)
AST: 33 U/L (ref 15–41)
BILIRUBIN DIRECT: 0.5 mg/dL (ref 0.1–0.5)
BILIRUBIN TOTAL: 1.1 mg/dL (ref 0.3–1.2)
Indirect Bilirubin: 0.6 mg/dL (ref 0.3–0.9)
Total Protein: 6.9 g/dL (ref 6.5–8.1)

## 2015-02-28 LAB — BASIC METABOLIC PANEL
Anion gap: 11 (ref 5–15)
BUN: 15 mg/dL (ref 6–20)
CHLORIDE: 105 mmol/L (ref 101–111)
CO2: 27 mmol/L (ref 22–32)
Calcium: 9.1 mg/dL (ref 8.9–10.3)
Creatinine, Ser: 0.78 mg/dL (ref 0.44–1.00)
GFR calc Af Amer: >60 mL/min (ref >60)
GFR calc non Af Amer: >60 mL/min (ref >60)
GLUCOSE: 145 mg/dL — AB (ref 65–99)
POTASSIUM: 3.4 mmol/L — AB (ref 3.5–5.1)
Sodium: 143 mmol/L (ref 135–145)

## 2015-02-28 LAB — CBC
HEMATOCRIT: 34.4 % — AB (ref 36.0–46.0)
HEMOGLOBIN: 11.4 g/dL — AB (ref 12.0–15.0)
MCH: 29.1 pg (ref 26.0–34.0)
MCHC: 33.1 g/dL (ref 30.0–36.0)
MCV: 87.8 fL (ref 78.0–100.0)
Platelets: 203 10*3/uL (ref 150–400)
RBC: 3.92 MIL/uL (ref 3.87–5.11)
RDW: 13.7 % (ref 11.5–15.5)
WBC: 6.2 10*3/uL (ref 4.0–10.5)

## 2015-02-28 LAB — TROPONIN I: Troponin I: <0.03 ng/mL (ref <0.031)

## 2015-02-28 LAB — LIPASE, BLOOD: Lipase: 45 U/L (ref 11–51)

## 2015-02-28 MED ORDER — OXYCODONE-ACETAMINOPHEN 5-325 MG PO TABS: 1.0000 | ORAL_TABLET | ORAL | Status: DC | PRN

## 2015-02-28 MED ORDER — SODIUM CHLORIDE 0.9 % IV SOLN
1000.0000 mL | Freq: Once | INTRAVENOUS | Status: AC
  Administered 2015-02-28: 1000 mL via INTRAVENOUS

## 2015-02-28 MED ORDER — SODIUM CHLORIDE 0.9 % IV SOLN
1000.0000 mL | INTRAVENOUS | Status: DC
  Administered 2015-02-28: 1000 mL via INTRAVENOUS

## 2015-02-28 MED ORDER — ONDANSETRON HCL 4 MG/2ML IJ SOLN
4.0000 mg | Freq: Once | INTRAMUSCULAR | Status: AC
  Administered 2015-02-28: 4 mg via INTRAVENOUS
  Filled 2015-02-28: qty 2

## 2015-02-28 MED ORDER — MORPHINE SULFATE (PF) 4 MG/ML IV SOLN
4.0000 mg | Freq: Once | INTRAVENOUS | Status: AC
  Administered 2015-02-28: 4 mg via INTRAVENOUS
  Filled 2015-02-28: qty 1

## 2015-02-28 MED ORDER — ONDANSETRON HCL 4 MG PO TABS: 4.0000 mg | ORAL_TABLET | Freq: Four times a day (QID) | ORAL | Status: DC | PRN

## 2015-02-28 NOTE — ED Notes (Signed)
Patient pale and diaphoretic

## 2015-02-28 NOTE — Discharge Instructions (Signed)
Stay on a low fat diet. Return if you have pain which is not being controlled at home, you have uncontrolled vomiting, or if you start running a fever.  Biliary Colic Biliary colic is a pain in the upper abdomen. The pain:  Is usually felt on the right side of the abdomen, but it may also be felt in the center of the abdomen, just below the breastbone (sternum).  May spread back toward the right shoulder blade.  May be steady or irregular.  May be accompanied by nausea and vomiting. Most of the time, the pain goes away in 1-5 hours. After the most intense pain passes, the abdomen may continue to ache mildly for about 24 hours. Biliary colic is caused by a blockage in the bile duct. The bile duct is a pathway that carries bile--a liquid that helps to digest fats--from the gallbladder to the small intestine. Biliary colic usually occurs after eating, when the digestive system demands bile. The pain develops when muscle cells contract forcefully to try to move the blockage so that bile can get by. HOME CARE INSTRUCTIONS  Take medicines only as directed by your health care provider.  Drink enough fluid to keep your urine clear or pale yellow.  Avoid fatty, greasy, and fried foods. These kinds of foods increase your body's demand for bile.  Avoid any foods that make your pain worse.  Avoid overeating.  Avoid having a large meal after fasting. SEEK MEDICAL CARE IF:  You develop a fever.  Your pain gets worse.  You vomit.  You develop nausea that prevents you from eating and drinking. SEEK IMMEDIATE MEDICAL CARE IF:  You suddenly develop a fever and shaking chills.  You develop a yellowish discoloration (jaundice) of:  Skin.  Whites of the eyes.  Mucous membranes.  You have continuous or severe pain that is not relieved with medicines.  You have nausea and vomiting that is not relieved with medicines.  You develop dizziness or you faint.   This information is not  intended to replace advice given to you by your health care provider. Make sure you discuss any questions you have with your health care provider.   Document Released: 06/26/2005 Document Revised: 06/09/2014 Document Reviewed: 11/04/2013 Elsevier Interactive Patient Education 2016 Elsevier [employer].  Ondansetron tablets What is this medicine? ONDANSETRON (on DAN se tron) is used to treat nausea and vomiting caused by chemotherapy. It is also used to prevent or treat nausea and vomiting after surgery. This medicine may be used for other purposes; ask your health care provider or pharmacist if you have questions. What should I tell my health care provider before I take this medicine? They need to know if you have any of these conditions: -heart disease -history of irregular heartbeat -liver disease -low levels of magnesium or potassium in the blood -an unusual or allergic reaction to ondansetron, granisetron, other medicines, foods, dyes, or preservatives -pregnant or trying to get pregnant -breast-feeding How should I use this medicine? Take this medicine by mouth with a glass of water. Follow the directions on your prescription label. Take your doses at regular intervals. Do not take your medicine more often than directed. Talk to your pediatrician regarding the use of this medicine in children. Special care may be needed. Overdosage: If you think you have taken too much of this medicine contact a poison control center or emergency room at once. NOTE: This medicine is only for you. Do not share this medicine with others. What if  I miss a dose? If you miss a dose, take it as soon as you can. If it is almost time for your next dose, take only that dose. Do not take double or extra doses. What may interact with this medicine? Do not take this medicine with any of the following medications: -apomorphine -certain medicines for fungal infections like fluconazole, itraconazole, ketoconazole,  posaconazole, voriconazole -cisapride -dofetilide -dronedarone -pimozide -thioridazine -ziprasidone This medicine may also interact with the following medications: -carbamazepine -certain medicines for depression, anxiety, or psychotic disturbances -fentanyl -linezolid -MAOIs like Carbex, Eldepryl, Marplan, Nardil, and Parnate -methylene blue (injected into a vein) -other medicines that prolong the QT interval (cause an abnormal heart rhythm) -phenytoin -rifampicin -tramadol This list may not describe all possible interactions. Give your health care provider a list of all the medicines, herbs, non-prescription drugs, or dietary supplements you use. Also tell them if you smoke, drink alcohol, or use illegal drugs. Some items may interact with your medicine. What should I watch for while using this medicine? Check with your doctor or health care professional right away if you have any sign of an allergic reaction. What side effects may I notice from receiving this medicine? Side effects that you should report to your doctor or health care professional as soon as possible: -allergic reactions like skin rash, itching or hives, swelling of the face, lips or tongue -breathing problems -confusion -dizziness -fast or irregular heartbeat -feeling faint or lightheaded, falls -fever and chills -loss of balance or coordination -seizures -sweating -swelling of the hands or feet -tightness in the chest -tremors -unusually weak or tired Side effects that usually do not require medical attention (report to your doctor or health care professional if they continue or are bothersome): -constipation or diarrhea -headache This list may not describe all possible side effects. Call your doctor for medical advice about side effects. You may report side effects to FDA at 1-800-FDA-1088. Where should I keep my medicine? Keep out of the reach of children. Store between 2 and 30 degrees C (36 and 86  degrees F). Throw away any unused medicine after the expiration date. NOTE: This sheet is a summary. It may not cover all possible information. If you have questions about this medicine, talk to your doctor, pharmacist, or health care provider.    2016, Elsevier/Gold Standard. (2012-10-30 16:27:45)  Acetaminophen; Oxycodone tablets What is this medicine? ACETAMINOPHEN; OXYCODONE (a set a MEE noe fen; ox i KOE done) is a pain reliever. It is used to treat moderate to severe pain. This medicine may be used for other purposes; ask your health care provider or pharmacist if you have questions. What should I tell my health care provider before I take this medicine? They need to know if you have any of these conditions: -brain tumor -Crohn's disease, inflammatory bowel disease, or ulcerative colitis -drug abuse or addiction -head injury -heart or circulation problems -if you often drink alcohol -kidney disease or problems going to the bathroom -liver disease -lung disease, asthma, or breathing problems -an unusual or allergic reaction to acetaminophen, oxycodone, other opioid analgesics, other medicines, foods, dyes, or preservatives -pregnant or trying to get pregnant -breast-feeding How should I use this medicine? Take this medicine by mouth with a full glass of water. Follow the directions on the prescription label. You can take it with or without food. If it upsets your stomach, take it with food. Take your medicine at regular intervals. Do not take it more often than directed. Talk to your  pediatrician regarding the use of this medicine in children. Special care may be needed. Patients over 9 years old may have a stronger reaction and need a smaller dose. Overdosage: If you think you have taken too much of this medicine contact a poison control center or emergency room at once. NOTE: This medicine is only for you. Do not share this medicine with others. What if I miss a dose? If you  miss a dose, take it as soon as you can. If it is almost time for your next dose, take only that dose. Do not take double or extra doses. What may interact with this medicine? -alcohol -antihistamines -barbiturates like amobarbital, butalbital, butabarbital, methohexital, pentobarbital, phenobarbital, thiopental, and secobarbital -benztropine -drugs for bladder problems like solifenacin, trospium, oxybutynin, tolterodine, hyoscyamine, and methscopolamine -drugs for breathing problems like ipratropium and tiotropium -drugs for certain stomach or intestine problems like propantheline, homatropine methylbromide, glycopyrrolate, atropine, belladonna, and dicyclomine -general anesthetics like etomidate, ketamine, nitrous oxide, propofol, desflurane, enflurane, halothane, isoflurane, and sevoflurane -medicines for depression, anxiety, or psychotic disturbances -medicines for sleep -muscle relaxants -naltrexone -narcotic medicines (opiates) for pain -phenothiazines like perphenazine, thioridazine, chlorpromazine, mesoridazine, fluphenazine, prochlorperazine, promazine, and trifluoperazine -scopolamine -tramadol -trihexyphenidyl This list may not describe all possible interactions. Give your health care provider a list of all the medicines, herbs, non-prescription drugs, or dietary supplements you use. Also tell them if you smoke, drink alcohol, or use illegal drugs. Some items may interact with your medicine. What should I watch for while using this medicine? Tell your doctor or health care professional if your pain does not go away, if it gets worse, or if you have new or a different type of pain. You may develop tolerance to the medicine. Tolerance means that you will need a higher dose of the medication for pain relief. Tolerance is normal and is expected if you take this medicine for a long time. Do not suddenly stop taking your medicine because you may develop a severe reaction. Your body becomes  used to the medicine. This does NOT mean you are addicted. Addiction is a behavior related to getting and using a drug for a non-medical reason. If you have pain, you have a medical reason to take pain medicine. Your doctor will tell you how much medicine to take. If your doctor wants you to stop the medicine, the dose will be slowly lowered over time to avoid any side effects. You may get drowsy or dizzy. Do not drive, use machinery, or do anything that needs mental alertness until you know how this medicine affects you. Do not stand or sit up quickly, especially if you are an older patient. This reduces the risk of dizzy or fainting spells. Alcohol may interfere with the effect of this medicine. Avoid alcoholic drinks. There are different types of narcotic medicines (opiates) for pain. If you take more than one type at the same time, you may have more side effects. Give your health care provider a list of all medicines you use. Your doctor will tell you how much medicine to take. Do not take more medicine than directed. Call emergency for help if you have problems breathing. The medicine will cause constipation. Try to have a bowel movement at least every 2 to 3 days. If you do not have a bowel movement for 3 days, call your doctor or health care professional. Do not take Tylenol (acetaminophen) or medicines that have acetaminophen with this medicine. Too much acetaminophen can be very dangerous. Many nonprescription  medicines contain acetaminophen. Always read the labels carefully to avoid taking more acetaminophen. What side effects may I notice from receiving this medicine? Side effects that you should report to your doctor or health care professional as soon as possible: -allergic reactions like skin rash, itching or hives, swelling of the face, lips, or tongue -breathing difficulties, wheezing -confusion -light headedness or fainting spells -severe stomach pain -unusually weak or tired -yellowing  of the skin or the whites of the eyes Side effects that usually do not require medical attention (report to your doctor or health care professional if they continue or are bothersome): -dizziness -drowsiness -nausea -vomiting This list may not describe all possible side effects. Call your doctor for medical advice about side effects. You may report side effects to FDA at 1-800-FDA-1088. Where should I keep my medicine? Keep out of the reach of children. This medicine can be abused. Keep your medicine in a safe place to protect it from theft. Do not share this medicine with anyone. Selling or giving away this medicine is dangerous and against the law. This medicine may cause accidental overdose and death if it taken by other adults, children, or pets. Mix any unused medicine with a substance like cat litter or coffee grounds. Then throw the medicine away in a sealed container like a sealed bag or a coffee can with a lid. Do not use the medicine after the expiration date. Store at room temperature between 20 and 25 degrees C (68 and 77 degrees F). NOTE: This sheet is a summary. It may not cover all possible information. If you have questions about this medicine, talk to your doctor, pharmacist, or health care provider.    2016, Elsevier/Gold Standard. (2013-12-24 15:18:46)

## 2015-02-28 NOTE — ED Notes (Signed)
Pt actively vomiting x3. Rn notified.

## 2015-02-28 NOTE — ED Notes (Signed)
Patient assisted to the exam chair due to being pale and diaphoretic

## 2015-02-28 NOTE — ED Notes (Signed)
Patient presents stating she started with pain to her right side that travels around to her back and  To the top of her stomach

## 2015-02-28 NOTE — ED Provider Notes (Signed)
CSN: [id number]     Arrival date & time 02/28/15  0042 History   First MD Initiated Contact with Patient 02/28/15 0456     Chief Complaint  Patient presents with  . Abdominal Pain  . Flank Pain     (Consider location/radiation/quality/duration/timing/severity/associated sxs/prior Treatment) Patient is a 65 y.o. female presenting with abdominal pain and flank pain. The history is provided by the patient.  Abdominal Pain Flank Pain Associated symptoms include abdominal pain.  For the past week, she has been having epigastric pain which is worse after she eats. His also worse when she lays down. There has been some associated nausea. Pain got worse last night and she was vomiting at home. She rates pain at 9/10. It does radiate through to her back. She denies fever, chills, sweats. She denies constipation or diarrhea. She's never had symptoms like this before.  Past Medical History  Diagnosis Date  . Hypertension   . Arthritis   . Stroke [facility])    Past Surgical History  Procedure Laterality Date  . Joint replacement    . Replacement total knee Left    No family history on file. Social History  Substance Use Topics  . Smoking status: Never Smoker   . Smokeless tobacco: Never Used  . Alcohol Use: No   OB History    No data available     Review of Systems  Gastrointestinal: Positive for abdominal pain.  Genitourinary: Positive for flank pain.  All other systems reviewed and are negative.     Allergies  Review of patient's allergies indicates no known allergies.  Home Medications   Prior to Admission medications   Medication Sig Start Date End Date Taking? Authorizing Provider  aspirin 81 MG EC tablet Take 1 tablet (81 mg total) by mouth daily. 09/18/13   Lavon Paganini Angiulli, PA-C  atorvastatin (LIPITOR) 40 MG tablet Take 1 tablet (40 mg total) by mouth daily at 6 PM. 09/18/13   Lavon Paganini Angiulli, PA-C  HYDROcodone-acetaminophen (NORCO) 5-325 MG tablet Take 1 tablet by  mouth every 4 (four) hours as needed for moderate pain. 12/12/14   Tatyana Kirichenko, PA-C  losartan (COZAAR) 50 MG tablet Take 50 mg by mouth daily.    Historical Provider, MD  metoprolol tartrate (LOPRESSOR) 12.5 mg TABS tablet Take 0.5 tablets (12.5 mg total) by mouth 2 (two) times daily. 09/18/13   Lavon Paganini Angiulli, PA-C  research study medication Take 75 mg by mouth daily with breakfast. 09/18/13   Lavon Paganini Angiulli, PA-C   BP 150/85 mmHg  Pulse 88  Temp(Src) 97.5 F (36.4 C) (Oral)  Resp 29  Wt 233 lb (105.688 kg)  SpO2 97% Physical Exam  Nursing note and vitals reviewed.  65 year old female, resting comfortably and in no acute distress. Vital signs are significant for hypertension and tachypnea. Oxygen saturation is 97%, which is normal. Head is normocephalic and atraumatic. PERRLA, EOMI. Oropharynx is clear. Neck is nontender and supple without adenopathy or JVD. Back is nontender and there is no CVA tenderness. Lungs are clear without rales, wheezes, or rhonchi. Chest is nontender. Heart has regular rate and rhythm without murmur. Abdomen is soft, flat, with mild epigastric tenderness and moderate to severe right upper quadrant tenderness with positive Murphy sign. There are no masses or hepatosplenomegaly and peristalsis is slightly hypoactive. Extremities have no cyanosis or edema, full range of motion is present. Skin is warm and dry without rash. Neurologic: Mental status is normal, cranial nerves are intact,  there are no motor or sensory deficits.  ED Course  Procedures (including critical care time) Labs Review Results for orders placed or performed during the hospital encounter of [id number]  Basic metabolic panel  Result Value Ref Range   Sodium 143 135 - 145 mmol/L   Potassium 3.4 (L) 3.5 - 5.1 mmol/L   Chloride 105 101 - 111 mmol/L   CO2 27 22 - 32 mmol/L   Glucose, Bld 145 (H) 65 - 99 mg/dL   BUN 15 6 - 20 mg/dL   Creatinine, Ser 0.78 0.44 - 1.00 mg/dL    Calcium 9.1 8.9 - 10.3 mg/dL   GFR calc non Af Amer >60 >60 mL/min   GFR calc Af Amer >60 >60 mL/min   Anion gap 11 5 - 15  CBC  Result Value Ref Range   WBC 6.2 4.0 - 10.5 K/uL   RBC 3.92 3.87 - 5.11 MIL/uL   Hemoglobin 11.4 (L) 12.0 - 15.0 g/dL   HCT 34.4 (L) 36.0 - 46.0 %   MCV 87.8 78.0 - 100.0 fL   MCH 29.1 26.0 - 34.0 pg   MCHC 33.1 30.0 - 36.0 g/dL   RDW 13.7 11.5 - 15.5 %   Platelets 203 150 - 400 K/uL  Troponin I  Result Value Ref Range   Troponin I <0.03 <0.031 ng/mL  Hepatic function panel  Result Value Ref Range   Total Protein 6.9 6.5 - 8.1 g/dL   Albumin 3.4 (L) 3.5 - 5.0 g/dL   AST 33 15 - 41 U/L   ALT 25 14 - 54 U/L   Alkaline Phosphatase 56 38 - 126 U/L   Total Bilirubin 1.1 0.3 - 1.2 mg/dL   Bilirubin, Direct 0.5 0.1 - 0.5 mg/dL   Indirect Bilirubin 0.6 0.3 - 0.9 mg/dL  Lipase, blood  Result Value Ref Range   Lipase 45 11 - 51 U/L   Imaging Review US Abdomen Complete  02/28/2015  CLINICAL DATA:  65 year old female with right abdominal pain, nausea and vomiting. EXAM: ABDOMEN ULTRASOUND COMPLETE COMPARISON:  None. FINDINGS: Evaluation is limited due to patient's body habitus and overlying bowel gas. Gallbladder: There multiple stones within the gallbladder. No gallbladder wall thickening or pericholecystic fluid. Negative sonographic Murphy's sign. Common bile duct: Diameter: Top normal diameter 8 mm. Liver: Heterogeneous echotexture with increased echogenicity likely related to underlying fatty infiltration. IVC: Not visualized. Pancreas: The visualized portion of the head of the pancreas appear grossly unremarkable. Spleen: Size and appearance within normal limits. Right Kidney: Length: 12 cm. No hydronephrosis on limited evaluation. Left Kidney: Length: 13 cm go. No hydronephrosis on limited evaluation. Abdominal aorta: Not visualized Other findings: None. IMPRESSION: Cholelithiasis without sonographic evidence of acute cholecystitis. Fatty liver. Electronically  Signed   By: Anner Crete M.D.   On: 02/28/2015 06:02   Dg Chest Portable 1 View  02/28/2015  CLINICAL DATA:  65 year old female with chest pain and shortness of breath EXAM: PORTABLE CHEST 1 VIEW COMPARISON:  Radiograph dated 08/24/2013 FINDINGS: Single portable view of the chest demonstrates mild increased density at the left lung base which may represent atelectatic changes. Pneumonia is not excluded. Clinical correlation and follow-up recommended. No pleural effusion or pneumothorax. Stable cardiac silhouette. The osseous structures appear unremarkable. IMPRESSION: Left lung base atelectasis versus pneumonia. Follow-up recommended . Electronically Signed   By: Anner Crete M.D.   On: 02/28/2015 04:13   I have personally reviewed and evaluated these images and lab results as part of my medical  decision-making.   EKG Interpretation   Date/Time:  Sunday February 28 2015 01:28:07 EST Ventricular Rate:  68 PR Interval:  180 QRS Duration: 90 QT Interval:  424 QTC Calculation: 450 R Axis:   36 Text Interpretation:  Normal sinus rhythm Normal ECG When compared with  ECG of 11/07/2013, No significant change was found Confirmed by [facility]  MD,  Eliana Lueth ([id number]) on 02/28/2015 4:57:56 AM      MDM   Final diagnoses:  Calculus of gallbladder without cholecystitis without obstruction  Normochromic normocytic anemia  Biliary colic    Abdominal pain which is suspicious for biliary colic. She will be sent for ultrasound. Old records are reviewed and she has no relevant past visits. She has no previous imaging studies of her abdomen.  Ultrasound confirms presence of cholelithiasis. Laboratory workup shows mild anemia. No elevation of transaminases. She had good relief of pain with morphine. She is discharged with prescriptions for oxycodone have acetaminophen and ondansetron and is referred to general surgery for follow-up.  Delora Fuel, MD [id number]

## 2015-03-04 DIAGNOSIS — Z8673 Personal history of transient ischemic attack (TIA), and cerebral infarction without residual deficits: Secondary | ICD-10-CM | POA: Diagnosis not present

## 2015-03-04 DIAGNOSIS — K8020 Calculus of gallbladder without cholecystitis without obstruction: Secondary | ICD-10-CM | POA: Diagnosis not present

## 2015-03-04 DIAGNOSIS — I10 Essential (primary) hypertension: Secondary | ICD-10-CM | POA: Diagnosis not present

## 2015-03-10 ENCOUNTER — Telehealth: Payer: Self-pay | Admitting: Cardiovascular Disease

## 2015-03-10 NOTE — Telephone Encounter (Signed)
Received records from [facility] for appointment on 03/17/15 with Dr Gwenlyn Found.  Records given to [facility] (medical records) for Dr Kennon Holter schedule on 03/17/15. lp

## 2015-03-17 ENCOUNTER — Ambulatory Visit (INDEPENDENT_AMBULATORY_CARE_PROVIDER_SITE_OTHER): Payer: Commercial Managed Care - HMO | Admitting: Cardiovascular Disease

## 2015-03-17 ENCOUNTER — Encounter: Payer: Self-pay | Admitting: Cardiovascular Disease

## 2015-03-17 VITALS — BP 138/82 | HR 70 | Ht 63.0 in | Wt 225.1 lb

## 2015-03-17 DIAGNOSIS — Z01810 Encounter for preprocedural cardiovascular examination: Secondary | ICD-10-CM | POA: Diagnosis not present

## 2015-03-17 DIAGNOSIS — Z01818 Encounter for other preprocedural examination: Secondary | ICD-10-CM | POA: Diagnosis not present

## 2015-03-17 DIAGNOSIS — I10 Essential (primary) hypertension: Secondary | ICD-10-CM | POA: Diagnosis not present

## 2015-03-17 NOTE — Patient Instructions (Addendum)
Medication Instructions: Dr berry has made no changes to your current medications. Please continue taking them as directed.  Labwork: NONE  Testing/Procedures: 1. Your physician has requested that you have a lexiscan myoview. For further information please visit [email or web address]. Please follow instruction sheet, as given.  Follow-up: Dr Gwenlyn Found recommends that you follow-up with him as needed.  If you need a refill on your cardiac medications before your next appointment, please call your pharmacy.

## 2015-03-17 NOTE — Assessment & Plan Note (Signed)
[name]  was referred for preoperative clearance before elective cholecystectomy. Her only cardiovascular risk factors are notable for treated hyperlipidemia and hypertension. She has had a stroke in the past. She apparently has cholelithiasis and is being considered for elective cholecystectomy. She has a 2-D echocardiogram performed 08/24/13 at the time of her stroke which was essentially normal with grade 1 diastolic dysfunction. I'm going to get a routine GXT to risk stratify her. This is normal we'll clear her at low perioperative cardiovascular risk and see her back when necessary

## 2015-03-17 NOTE — Progress Notes (Signed)
     03/17/2015 [name]   05/05/1950  [id number]  Primary Physician Elyn Peers, MD Primary Cardiologist: Lorretta Harp MD Renae Gloss   HPI:  [name] is a 65 year old moderately overweight divorced African-American female mother of 4 daughters, grandmother of a grandchildren referred for cardiovascular clearance prior to elective cholecystectomy. Her cardiac risk factors are notable for 20-pack-years of tobacco abuse having quit 20 years ago. She has treated hypertension and hyperlipidemia. She did suffer a stroke 08/23/13 and was left with some moderate left-sided weakness. She has never had a heart attack. She denies chest pain or shortness of breath.   Current Outpatient Prescriptions  Medication Sig Dispense Refill  . aspirin 81 MG EC tablet Take 1 tablet (81 mg total) by mouth daily. 30 tablet 12  . atorvastatin (LIPITOR) 40 MG tablet Take 1 tablet (40 mg total) by mouth daily at 6 PM. 30 tablet 1  . losartan (COZAAR) 50 MG tablet Take 50 mg by mouth daily.    . metoprolol tartrate (LOPRESSOR) 12.5 mg TABS tablet Take 0.5 tablets (12.5 mg total) by mouth 2 (two) times daily. 30 tablet 1   No current facility-administered medications for this visit.    No Known Allergies  Social History   Social History  . Marital Status: Single    Spouse Name: N/A  . Number of Children: N/A  . Years of Education: N/A   Occupational History  . Not on file.   Social History Main Topics  . Smoking status: Never Smoker   . Smokeless tobacco: Never Used  . Alcohol Use: No  . Drug Use: No  . Sexual Activity: Not on file   Other Topics Concern  . Not on file   Social History Narrative     Review of Systems: General: negative for chills, fever, night sweats or weight changes.  Cardiovascular: negative for chest pain, dyspnea on exertion, edema, orthopnea, palpitations, paroxysmal nocturnal dyspnea or shortness of breath Dermatological: negative for  rash Respiratory: negative for cough or wheezing Urologic: negative for hematuria Abdominal: negative for nausea, vomiting, diarrhea, bright red blood per rectum, melena, or hematemesis Neurologic: negative for visual changes, syncope, or dizziness All other systems reviewed and are otherwise negative except as noted above.    Blood pressure 138/82, pulse 70, height 5\' 3"  (1.6 m), weight 225 lb 1.6 oz (102.105 kg).  General appearance: alert and no distress Neck: no adenopathy, no carotid bruit, no JVD, supple, symmetrical, trachea midline and thyroid not enlarged, symmetric, no tenderness/mass/nodules Lungs: clear to auscultation bilaterally Heart: regular rate and rhythm, S1, S2 normal, no murmur, click, rub or gallop Extremities: extremities normal, atraumatic, no cyanosis or edema  EKG normal sinus rhythm at 66 without ST or T-wave changes. I personally reviewed this EKG  ASSESSMENT AND PLAN:   Preoperative clearance [name]  was referred for preoperative clearance before elective cholecystectomy. Her only cardiovascular risk factors are notable for treated hyperlipidemia and hypertension. She has had a stroke in the past. She apparently has cholelithiasis and is being considered for elective cholecystectomy. She has a 2-D echocardiogram performed 08/24/13 at the time of her stroke which was essentially normal with grade 1 diastolic dysfunction. I'm going to get a routine GXT to risk stratify her. This is normal we'll clear her at low perioperative cardiovascular risk and see her back when necessary      Lorretta Harp MD [facility], [facility] 03/17/2015 8:44 AM

## 2015-03-19 ENCOUNTER — Telehealth (HOSPITAL_COMMUNITY): Payer: Self-pay

## 2015-03-19 NOTE — Telephone Encounter (Signed)
Encounter complete. 

## 2015-03-24 ENCOUNTER — Ambulatory Visit (HOSPITAL_COMMUNITY)
Admission: RE | Admit: 2015-03-24 | Discharge: 2015-03-24 | Disposition: A | Discharge status: Home / Self Care | Payer: Commercial Managed Care - HMO | Source: Ambulatory Visit | Attending: Cardiovascular Disease | Admitting: Cardiovascular Disease

## 2015-03-24 DIAGNOSIS — I10 Essential (primary) hypertension: Secondary | ICD-10-CM | POA: Diagnosis not present

## 2015-03-24 DIAGNOSIS — Z01810 Encounter for preprocedural cardiovascular examination: Secondary | ICD-10-CM

## 2015-03-24 DIAGNOSIS — Z87891 Personal history of nicotine dependence: Secondary | ICD-10-CM | POA: Insufficient documentation

## 2015-03-24 DIAGNOSIS — Z01818 Encounter for other preprocedural examination: Secondary | ICD-10-CM | POA: Insufficient documentation

## 2015-03-24 DIAGNOSIS — E669 Obesity, unspecified: Secondary | ICD-10-CM | POA: Diagnosis not present

## 2015-03-24 DIAGNOSIS — Z6839 Body mass index (BMI) 39.0-39.9, adult: Secondary | ICD-10-CM | POA: Diagnosis not present

## 2015-03-24 LAB — MYOCARDIAL PERFUSION IMAGING
LV dias vol: 91 mL
LV sys vol: 31 mL
Peak HR: 98 {beats}/min
Rest HR: 69 {beats}/min
SDS: 6
SRS: 0
SSS: 6
TID: 1.13

## 2015-03-24 MED ORDER — AMINOPHYLLINE 25 MG/ML IV SOLN
75.0000 mg | Freq: Once | INTRAVENOUS | Status: AC
  Administered 2015-03-24: 75 mg via INTRAVENOUS

## 2015-03-24 MED ORDER — TECHNETIUM TC 99M SESTAMIBI GENERIC - CARDIOLITE
32.0000 | Freq: Once | INTRAVENOUS | Status: AC | PRN
  Administered 2015-03-24: 32 via INTRAVENOUS

## 2015-03-24 MED ORDER — REGADENOSON 0.4 MG/5ML IV SOLN
0.4000 mg | Freq: Once | INTRAVENOUS | Status: AC
  Administered 2015-03-24: 0.4 mg via INTRAVENOUS

## 2015-03-24 MED ORDER — TECHNETIUM TC 99M SESTAMIBI GENERIC - CARDIOLITE
9.8000 | Freq: Once | INTRAVENOUS | Status: AC | PRN
Start: 2015-03-24 — End: 2015-03-24
  Administered 2015-03-24: 9.8 via INTRAVENOUS

## 2015-03-27 ENCOUNTER — Emergency Department (HOSPITAL_COMMUNITY): Payer: Commercial Managed Care - HMO

## 2015-03-27 ENCOUNTER — Emergency Department (HOSPITAL_COMMUNITY)
Admission: EM | Admit: 2015-03-27 | Discharge: 2015-03-27 | Disposition: A | Discharge status: Home / Self Care | Payer: Commercial Managed Care - HMO | Attending: Emergency Medicine | Admitting: Emergency Medicine

## 2015-03-27 ENCOUNTER — Encounter (HOSPITAL_COMMUNITY): Payer: Self-pay

## 2015-03-27 DIAGNOSIS — R0602 Shortness of breath: Secondary | ICD-10-CM | POA: Diagnosis not present

## 2015-03-27 DIAGNOSIS — M1990 Unspecified osteoarthritis, unspecified site: Secondary | ICD-10-CM | POA: Insufficient documentation

## 2015-03-27 DIAGNOSIS — D649 Anemia, unspecified: Secondary | ICD-10-CM | POA: Diagnosis not present

## 2015-03-27 DIAGNOSIS — R42 Dizziness and giddiness: Secondary | ICD-10-CM | POA: Diagnosis not present

## 2015-03-27 DIAGNOSIS — R05 Cough: Secondary | ICD-10-CM | POA: Diagnosis not present

## 2015-03-27 DIAGNOSIS — E785 Hyperlipidemia, unspecified: Secondary | ICD-10-CM | POA: Insufficient documentation

## 2015-03-27 DIAGNOSIS — R059 Cough, unspecified: Secondary | ICD-10-CM

## 2015-03-27 DIAGNOSIS — Z7982 Long term (current) use of aspirin: Secondary | ICD-10-CM | POA: Insufficient documentation

## 2015-03-27 DIAGNOSIS — R197 Diarrhea, unspecified: Secondary | ICD-10-CM

## 2015-03-27 DIAGNOSIS — Z8673 Personal history of transient ischemic attack (TIA), and cerebral infarction without residual deficits: Secondary | ICD-10-CM | POA: Diagnosis not present

## 2015-03-27 DIAGNOSIS — I10 Essential (primary) hypertension: Secondary | ICD-10-CM | POA: Insufficient documentation

## 2015-03-27 DIAGNOSIS — Z79899 Other long term (current) drug therapy: Secondary | ICD-10-CM | POA: Diagnosis not present

## 2015-03-27 DIAGNOSIS — R0600 Dyspnea, unspecified: Secondary | ICD-10-CM | POA: Diagnosis not present

## 2015-03-27 DIAGNOSIS — E876 Hypokalemia: Secondary | ICD-10-CM

## 2015-03-27 DIAGNOSIS — R404 Transient alteration of awareness: Secondary | ICD-10-CM | POA: Diagnosis not present

## 2015-03-27 DIAGNOSIS — R55 Syncope and collapse: Secondary | ICD-10-CM | POA: Diagnosis not present

## 2015-03-27 LAB — COMPREHENSIVE METABOLIC PANEL
ALT: 18 U/L (ref 14–54)
AST: 18 U/L (ref 15–41)
Albumin: 3.8 g/dL (ref 3.5–5.0)
Alkaline Phosphatase: 67 U/L (ref 38–126)
Anion gap: 10 (ref 5–15)
BUN: 8 mg/dL (ref 6–20)
CALCIUM: 8.9 mg/dL (ref 8.9–10.3)
CHLORIDE: 106 mmol/L (ref 101–111)
CO2: 26 mmol/L (ref 22–32)
CREATININE: 0.77 mg/dL (ref 0.44–1.00)
GFR calc Af Amer: >60 mL/min (ref >60)
GFR calc non Af Amer: >60 mL/min (ref >60)
Glucose, Bld: 108 mg/dL — ABNORMAL HIGH (ref 65–99)
Potassium: 3.3 mmol/L — ABNORMAL LOW (ref 3.5–5.1)
SODIUM: 142 mmol/L (ref 135–145)
Total Bilirubin: 0.8 mg/dL (ref 0.3–1.2)
Total Protein: 7.8 g/dL (ref 6.5–8.1)

## 2015-03-27 LAB — D-DIMER, QUANTITATIVE: D-Dimer, Quant: 1.65 ug/mL-FEU — ABNORMAL HIGH (ref 0.00–0.50)

## 2015-03-27 LAB — CBC WITH DIFFERENTIAL/PLATELET
Basophils Absolute: 0 10*3/uL (ref 0.0–0.1)
Basophils Relative: 0 %
EOS ABS: 0.1 10*3/uL (ref 0.0–0.7)
EOS PCT: 1 %
HCT: 36.3 % (ref 36.0–46.0)
Hemoglobin: 11.7 g/dL — ABNORMAL LOW (ref 12.0–15.0)
LYMPHS ABS: 1 10*3/uL (ref 0.7–4.0)
LYMPHS PCT: 13 %
MCH: 28.6 pg (ref 26.0–34.0)
MCHC: 32.2 g/dL (ref 30.0–36.0)
MCV: 88.8 fL (ref 78.0–100.0)
MONO ABS: 0.5 10*3/uL (ref 0.1–1.0)
Monocytes Relative: 7 %
Neutro Abs: 5.9 10*3/uL (ref 1.7–7.7)
Neutrophils Relative %: 79 %
PLATELETS: 188 10*3/uL (ref 150–400)
RBC: 4.09 MIL/uL (ref 3.87–5.11)
RDW: 14.2 % (ref 11.5–15.5)
WBC: 7.5 10*3/uL (ref 4.0–10.5)

## 2015-03-27 LAB — TROPONIN I

## 2015-03-27 LAB — LIPASE, BLOOD: LIPASE: 25 U/L (ref 11–51)

## 2015-03-27 MED ORDER — IOHEXOL 350 MG/ML SOLN
100.0000 mL | Freq: Once | INTRAVENOUS | Status: AC | PRN
  Administered 2015-03-27: 100 mL via INTRAVENOUS

## 2015-03-27 MED ORDER — POTASSIUM CHLORIDE CRYS ER 20 MEQ PO TBCR
40.0000 meq | EXTENDED_RELEASE_TABLET | Freq: Once | ORAL | Status: AC
  Administered 2015-03-27: 40 meq via ORAL
  Filled 2015-03-27: qty 2

## 2015-03-27 MED ORDER — SODIUM CHLORIDE 0.9 % IV BOLUS (SEPSIS)
500.0000 mL | Freq: Once | INTRAVENOUS | Status: AC
  Administered 2015-03-27: 500 mL via INTRAVENOUS

## 2015-03-27 MED ORDER — SODIUM CHLORIDE 0.9 % IV BOLUS (SEPSIS)
1000.0000 mL | Freq: Once | INTRAVENOUS | Status: AC
  Administered 2015-03-27: 1000 mL via INTRAVENOUS

## 2015-03-27 NOTE — ED Notes (Signed)
Pt was at church this am after having had 4 loose stools prior to going to church.  While at church she was "violently" (reported to be normal for her) coughing and was trying not to have an episode of stool incontinence and had a suspected vagal response..she became diaphoretic, dizzy, cool and clammy.  Denied shob or chest pain.  AxOx4 when EMS arrived.  C/O abd pressure but no specific pain.  IV NS 266ml bolus for 80/50 initial BP up to 110/70 after bolus.  HR has 90's.  CBG 124.  RR 16, LS clear.

## 2015-03-27 NOTE — ED Notes (Signed)
Bed: WA21 Expected date: 03/27/15 Expected time: 1:00 PM Means of arrival: Ambulance Comments: Frequent  Diarrhea 3 times this am, diaphoretic now resolved

## 2015-03-27 NOTE — ED Notes (Signed)
Patient transported to CT 

## 2015-03-27 NOTE — ED Provider Notes (Signed)
CSN: [id number]     Arrival date & time 03/27/15  1301 History   First MD Initiated Contact with Patient 03/27/15 1416     Chief Complaint  Patient presents with  . Abdominal Pain    HPI   [name] is an 65 y.o. female with history of HTN, arthritis, stroke 2015 (no anticoagulation other than ASA, no residual symptoms) who presents to the ED for evaluation following pre-syncopal episode. The episode occurred just prior to arrival while pt was in church. Pt reports she has had 3-4 episodes of watery diarrhea this morning.She denies nausea or vomiting, denies abdominal pain. She states she think she ate something bad last night. She states she has had an intermittently productive cough for the past three weeks. States that she was coughing in church when she felt like she might soil her pants so she was straining to keep from doing so. She states that between the coughing, holding in her stool, and feeling dehydrated from diarrhea, she thinks she was about to pass out. She states she started feeling lightheaded and clammy. She states she did not pass out completely. Per EMS, pt was cool and clammy on arrival, hypotensive to 80/50. Pt was given 250 mL NS bolus en route and BP improved. In the ED now pt states she feels back to baseline. Denies dizziness, lightheadedness. Denies chest pain or SOB. She states her PCP prescribed an inhaler for her cough which she states has provided minimal relief.  Of note, pt recently had cardiology evaluation for clearance for lap cholecystectomy. She had a normal nuclear stress test and was cleared from a cardiac standpoint for surgery by Dr. Gwenlyn Found.  Past Medical History  Diagnosis Date  . Hypertension   . Arthritis   . Stroke ([place])   . Hyperlipidemia   . Preoperative clearance    Past Surgical History  Procedure Laterality Date  . Joint replacement    . Replacement total knee Left    History reviewed. No pertinent family history. Social History  Substance  Use Topics  . Smoking status: Never Smoker   . Smokeless tobacco: Never Used  . Alcohol Use: No   OB History    No data available     Review of Systems  All other systems reviewed and are negative.     Allergies  Review of patient's allergies indicates no known allergies.  Home Medications   Prior to Admission medications   Medication Sig Start Date End Date Taking? Authorizing Provider  ANORO ELLIPTA 62.5-25 MCG/INH AEPB Inhale 1 puff into the lungs daily. 02/17/15  Yes Historical Provider, MD  aspirin 81 MG EC tablet Take 1 tablet (81 mg total) by mouth daily. 09/18/13  Yes Daniel J Angiulli, PA-C  atorvastatin (LIPITOR) 40 MG tablet Take 1 tablet (40 mg total) by mouth daily at 6 PM. 09/18/13  Yes Daniel J Angiulli, PA-C  losartan (COZAAR) 50 MG tablet Take 50 mg by mouth daily.   Yes Historical Provider, MD  metoprolol tartrate (LOPRESSOR) 25 MG tablet Take 25 mg by mouth daily.   Yes Historical Provider, MD  metoprolol tartrate (LOPRESSOR) 12.5 mg TABS tablet Take 0.5 tablets (12.5 mg total) by mouth 2 (two) times daily. Patient not taking: Reported on 03/27/2015 09/18/13   Lavon Paganini Angiulli, PA-C   BP 128/77 mmHg  Pulse 105  Temp(Src) 98.2 F (36.8 C) (Oral)  Resp 20  SpO2 98% Physical Exam  Constitutional: She is oriented to person, place, and time.  HENT:  Right Ear: External ear normal.  Left Ear: External ear normal.  Nose: Nose normal.  Mouth/Throat: Mucous membranes are dry. No oropharyngeal exudate.  Eyes: Conjunctivae and EOM are normal. Pupils are equal, round, and reactive to light.  Neck: Normal range of motion. Neck supple.  Cardiovascular: Normal rate, regular rhythm, normal heart sounds and intact distal pulses.   Pulmonary/Chest: Effort normal and breath sounds normal. No respiratory distress. She has no wheezes. She exhibits no tenderness.  Abdominal: Soft. Bowel sounds are normal. She exhibits no distension. There is no tenderness. There is no rebound  and no guarding.  Musculoskeletal: She exhibits no edema.  Neurological: She is alert and oriented to person, place, and time. No cranial nerve deficit.  Skin: Skin is warm and dry.  Psychiatric: She has a normal mood and affect.  Nursing note and vitals reviewed.  Filed Vitals:   03/27/15 1317 03/27/15 1547 03/27/15 1700  BP: 128/77 146/81 123/77  Pulse: 105 105 102  Temp: 98.2 F (36.8 C)    TempSrc: Oral    Resp: 20 16 22   SpO2: 98% 98% 97%     ED Course  Procedures (including critical care time) Labs Review Labs Reviewed  COMPREHENSIVE METABOLIC PANEL - Abnormal; Notable for the following:    Potassium 3.3 (*)    Glucose, Bld 108 (*)    All other components within normal limits  CBC WITH DIFFERENTIAL/PLATELET - Abnormal; Notable for the following:    Hemoglobin 11.7 (*)    All other components within normal limits  D-DIMER, QUANTITATIVE (NOT AT [facility]) - Abnormal; Notable for the following:    D-Dimer, Quant 1.65 (*)    All other components within normal limits  LIPASE, BLOOD  TROPONIN I  I-STAT TROPOININ, ED    Imaging Review Dg Chest 2 View  03/27/2015  CLINICAL DATA:  Frequent diarrhea 3 times this am, diaphoretic now resolved. Also has had a cough x 3 weeks. H/o HTN, stroke. Nonsmoker. EXAM: CHEST  2 VIEW COMPARISON:  02/28/2015 FINDINGS: Cardiac silhouette is normal in size and configuration. No mediastinal or hilar masses or evidence of adenopathy. Clear lungs.  No pleural effusion or pneumothorax. Bony thorax is grossly intact. IMPRESSION: No active cardiopulmonary disease. Electronically Signed   By: Lajean Manes M.D.   On: 03/27/2015 15:26   I have personally reviewed and evaluated these images and lab results as part of my medical decision-making.   EKG Interpretation None      MDM   Final diagnoses:  None    Pt is an 65 y.o. female with history of HTN, stroke, anemia who presents following pre-syncopal episode at church. She has had . Initial  workup including CMP, CBC, lipase was unremarkable. Troponin negative. Pt tachycardic to low 100s on presentation. She denies chest pain, SOB. No hypoxia. Initially thought tachycardia secondary to hypovolemia. However, pt has persistently been tachycardic without improvement despite 1L NS bolus. She continues to deny chest symptoms. She denies h/o prior blood clot, malignancy, recent surgery/immobilization. Wells score 1.5. However, with syncope, persistent tachycardia, cannot r/o PE at this point. I discussed with pt. We will check d-dimer. If positive, we will get CTA PE. Pt is in agreement with plan.   D-dimer 1.65. CTA PE ordered. Pt will be signed out to oncoming team. Anticipate d/c home with PCP f/u, OTC supportive meds, oral rehydration, and BRAT diet pending negative CTA.  Anne Ng, PA-C 03/27/15 1713  Daleen Bo, MD 03/28/15 [phone]

## 2015-03-27 NOTE — Discharge Instructions (Signed)
Use tylenol or motrin as needed for pain. Stay well hydrated plenty of water throughout the day. Follow a BRAT (banana-rice-applesauce-toast) diet as described below for the next 24-48 hours. The 'BRAT' diet is suggested, then progress to diet as tolerated as symptoms abate. Call your regular doctor if bloody stools, persistent diarrhea, vomiting, fever or abdominal pain. Use Mucinex for cough suppression/expectoration of mucus. Use netipot and flonase to help with nasal congestion. May consider over-the-counter Benadryl or other antihistamine to decrease secretions and for watery itchy eyes. Use home inhaler as needed for cough. Followup with your primary care doctor in 3-5 days for recheck of ongoing symptoms. Return to emergency department for emergent changing or worsening of symptoms.   Food Choices to Help Relieve Diarrhea When you have diarrhea, the foods you eat and your eating habits are very important. Choosing the right foods and drinks can help relieve diarrhea. Also, because diarrhea can last up to 7 days, you need to replace lost fluids and electrolytes (such as sodium, potassium, and chloride) in order to help prevent dehydration.  WHAT GENERAL GUIDELINES DO I NEED TO FOLLOW?  Slowly drink 1 cup (8 oz) of fluid for each episode of diarrhea. If you are getting enough fluid, your urine will be clear or pale yellow.  Eat starchy foods. Some good choices include white rice, white toast, pasta, low-fiber cereal, baked potatoes (without the skin), saltine crackers, and bagels.  Avoid large servings of any cooked vegetables.  Limit fruit to two servings per day. A serving is  cup or 1 small piece.  Choose foods with less than 2 g of fiber per serving.  Limit fats to less than 8 tsp (38 g) per day.  Avoid fried foods.  Eat foods that have probiotics in them. Probiotics can be found in certain dairy products.  Avoid foods and beverages that may increase the speed at which food moves  through the stomach and intestines (gastrointestinal tract). Things to avoid include:  High-fiber foods, such as dried fruit, raw fruits and vegetables, nuts, seeds, and whole grain foods.  Spicy foods and high-fat foods.  Foods and beverages sweetened with high-fructose corn syrup, honey, or sugar alcohols such as xylitol, sorbitol, and mannitol. WHAT FOODS ARE RECOMMENDED? Grains White rice. White, [place], or pita breads (fresh or toasted), including plain rolls, buns, or bagels. White pasta. Saltine, soda, or graham crackers. Pretzels. Low-fiber cereal. Cooked cereals made with water (such as cornmeal, farina, or cream cereals). Plain muffins. Matzo. Melba toast. Zwieback.  Vegetables Potatoes (without the skin). Strained tomato and vegetable juices. Most well-cooked and canned vegetables without seeds. Tender lettuce. Fruits Cooked or canned applesauce, apricots, cherries, fruit cocktail, grapefruit, peaches, pears, or plums. Fresh bananas, apples without skin, cherries, grapes, cantaloupe, grapefruit, peaches, oranges, or plums.  Meat and Other Protein Products Baked or boiled chicken. Eggs. Tofu. Fish. Seafood. Smooth peanut butter. Ground or well-cooked tender beef, ham, veal, lamb, pork, or poultry.  Dairy Plain yogurt, kefir, and unsweetened liquid yogurt. Lactose-free milk, buttermilk, or soy milk. Plain hard cheese. Beverages Sport drinks. Clear broths. Diluted fruit juices (except prune). Regular, caffeine-free sodas such as ginger ale. Water. Decaffeinated teas. Oral rehydration solutions. Sugar-free beverages not sweetened with sugar alcohols. Other Bouillon, broth, or soups made from recommended foods.  The items listed above may not be a complete list of recommended foods or beverages. Contact your dietitian for more options. WHAT FOODS ARE NOT RECOMMENDED? Grains Whole grain, whole wheat, bran, or rye breads, rolls, pastas,  crackers, and cereals. Wild or brown rice. Cereals  that contain more than 2 g of fiber per serving. Corn tortillas or taco shells. Cooked or dry oatmeal. Granola. Popcorn. Vegetables Raw vegetables. Cabbage, broccoli, Brussels sprouts, artichokes, baked beans, beet greens, corn, kale, legumes, peas, sweet potatoes, and yams. Potato skins. Cooked spinach and cabbage. Fruits Dried fruit, including raisins and dates. Raw fruits. Stewed or dried prunes. Fresh apples with skin, apricots, mangoes, pears, raspberries, and strawberries.  Meat and Other Protein Products Chunky peanut butter. Nuts and seeds. Beans and lentils. Berniece Salines.  Dairy High-fat cheeses. Milk, chocolate milk, and beverages made with milk, such as milk shakes. Cream. Ice cream. Sweets and Desserts Sweet rolls, doughnuts, and sweet breads. Pancakes and waffles. Fats and Oils Butter. Cream sauces. Margarine. Salad oils. Plain salad dressings. Olives. Avocados.  Beverages Caffeinated beverages (such as coffee, tea, soda, or energy drinks). Alcoholic beverages. Fruit juices with pulp. Prune juice. Soft drinks sweetened with high-fructose corn syrup or sugar alcohols. Other Coconut. Hot sauce. Chili powder. Mayonnaise. Gravy. Cream-based or milk-based soups.  The items listed above may not be a complete list of foods and beverages to avoid. Contact your dietitian for more information. WHAT SHOULD I DO IF I BECOME DEHYDRATED? Diarrhea can sometimes lead to dehydration. Signs of dehydration include dark urine and dry mouth and skin. If you think you are dehydrated, you should rehydrate with an oral rehydration solution. These solutions can be purchased at pharmacies, retail stores, or online.  Drink -1 cup (120-240 mL) of oral rehydration solution each time you have an episode of diarrhea. If drinking this amount makes your diarrhea worse, try drinking smaller amounts more often. For example, drink 1-3 tsp (5-15 mL) every 5-10 minutes.  A general rule for staying hydrated is to drink 1-2 L  of fluid per day. Talk to your health care provider about the specific amount you should be drinking each day. Drink enough fluids to keep your urine clear or pale yellow. Document Released: 04/15/2003 Document Revised: 01/28/2013 Document Reviewed: 12/16/2012 [facility] Patient Information 2015 [place], [place]. This information is not intended to replace advice given to you by your health care provider. Make sure you discuss any questions you have with your health care provider.  Cough, Adult A cough helps to clear your throat and lungs. A cough may last only 2-3 weeks (acute), or it may last longer than 8 weeks (chronic). Many different things can cause a cough. A cough may be a sign of an illness or another medical condition. HOME CARE  Pay attention to any changes in your cough.  Take medicines only as told by your doctor.  If you were prescribed an antibiotic medicine, take it as told by your doctor. Do not stop taking it even if you start to feel better.  Talk with your doctor before you try using a cough medicine.  Drink enough fluid to keep your pee (urine) clear or pale yellow.  If the air is dry, use a cold steam vaporizer or humidifier in your home.  Stay away from things that make you cough at work or at home.  If your cough is worse at night, try using extra pillows to raise your head up higher while you sleep.  Do not smoke, and try not to be around smoke. If you need help quitting, ask your doctor.  Do not have caffeine.  Do not drink alcohol.  Rest as needed. GET HELP IF:  You have new problems (symptoms).  You cough up yellow fluid (pus).  Your cough does not get better after 2-3 weeks, or your cough gets worse.  Medicine does not help your cough and you are not sleeping well.  You have pain that gets worse or pain that is not helped with medicine.  You have a fever.  You are losing weight and you do not know why.  You have night sweats. GET HELP RIGHT  AWAY IF:  You cough up blood.  You have trouble breathing.  Your heartbeat is very fast.   This information is not intended to replace advice given to you by your health care provider. Make sure you discuss any questions you have with your health care provider.   Document Released: 10/06/2010 Document Revised: 10/14/2014 Document Reviewed: 04/01/2014 Elsevier Interactive Patient Education 2016 [place].  Diarrhea Diarrhea is watery poop (stool). It can make you feel weak, tired, thirsty, or give you a dry mouth (signs of dehydration). Watery poop is a sign of another problem, most often an infection. It often lasts 2-3 days. It can last longer if it is a sign of something serious. Take care of yourself as told by your doctor. HOME CARE   Drink 1 cup (8 ounces) of fluid each time you have watery poop.  Do not drink the following fluids:  Those that contain simple sugars (fructose, glucose, galactose, lactose, sucrose, maltose).  Sports drinks.  Fruit juices.  Whole milk products.  Sodas.  Drinks with caffeine (coffee, tea, soda) or alcohol.  Oral rehydration solution may be used if the doctor says it is okay. You may make your own solution. Follow this recipe:   - teaspoon table salt.   teaspoon baking soda.   teaspoon salt substitute containing potassium chloride.  1 tablespoons sugar.  1 liter (34 ounces) of water.  Avoid the following foods:  High fiber foods, such as raw fruits and vegetables.  Nuts, seeds, and whole grain breads and cereals.   Those that are sweetened with sugar alcohols (xylitol, sorbitol, mannitol).  Try eating the following foods:  Starchy foods, such as rice, toast, pasta, low-sugar cereal, oatmeal, baked potatoes, crackers, and bagels.  Bananas.  Applesauce.  Eat probiotic-rich foods, such as yogurt and milk products that are fermented.  Wash your hands well after each time you have watery poop.  Only take medicine as  told by your doctor.  Take a warm bath to help lessen burning or pain from having watery poop. GET HELP RIGHT AWAY IF:   You cannot drink fluids without throwing up (vomiting).  You keep throwing up.  You have blood in your poop, or your poop looks black and tarry.  You do not pee (urinate) in 6-8 hours, or there is only a small amount of very dark pee.  You have belly (abdominal) pain that gets worse or stays in the same spot (localizes).  You are weak, dizzy, confused, or light-headed.  You have a very bad headache.  Your watery poop gets worse or does not get better.  You have a fever or lasting symptoms for more than 2-3 days.  You have a fever and your symptoms suddenly get worse. MAKE SURE YOU:   Understand these instructions.  Will watch your condition.  Will get help right away if you are not doing well or get worse.   This information is not intended to replace advice given to you by your health care provider. Make sure you discuss any questions you have with  your health care provider.   Document Released: 07/12/2007 Document Revised: 02/13/2014 Document Reviewed: 10/01/2011 Elsevier Interactive Patient Education 2016 [employer].  Near-Syncope Near-syncope (commonly known as near fainting) is sudden weakness, dizziness, or feeling like you might pass out. This can happen when getting up or while standing for a long time. It is caused by a sudden decrease in blood flow to the brain, which can occur for various reasons. Most of the reasons are not serious.  HOME CARE Watch your condition for any changes.  Have someone stay with you until you feel stable.  If you feel like you are going to pass out:  Lie down right away.  Prop your feet up if you can.  Breathe deeply and steadily.  Move only when the feeling has gone away. Most of the time, this feeling lasts only a few minutes. You may feel tired for several hours.  Drink enough fluids to keep your pee  (urine) clear or pale yellow.  If you are taking blood pressure or heart medicine, stand up slowly.  Follow up with your doctor as told. GET HELP RIGHT AWAY IF:   You have a severe headache.  You have unusual pain in the chest, belly (abdomen), or back.  You have bleeding from the mouth or butt (rectum), or you have black or tarry poop (stool).  You feel your heart beat differently than normal, or you have a very fast pulse.  You pass out, or you twitch and shake when you pass out.  You pass out when sitting or lying down.  You feel confused.  You have trouble walking.  You are weak.  You have vision problems. MAKE SURE YOU:   Understand these instructions.  Will watch your condition.  Will get help right away if you are not doing well or get worse.   This information is not intended to replace advice given to you by your health care provider. Make sure you discuss any questions you have with your health care provider.   Document Released: 07/12/2007 Document Revised: 02/13/2014 Document Reviewed: 06/28/2012 Elsevier Interactive Patient Education 2016 [employer].  Syncope, commonly known as fainting, is a temporary loss of consciousness. It occurs when the blood flow to the brain is reduced. Vasovagal syncope (also called neurocardiogenic syncope) is a fainting spell in which the blood flow to the brain is reduced because of a sudden drop in heart rate and blood pressure. Vasovagal syncope occurs when the brain and the cardiovascular system (blood vessels) do not adequately communicate and respond to each other. This is the most common cause of fainting. It often occurs in response to fear or some other type of emotional or physical stress. The body has a reaction in which the heart starts beating too slowly or the blood vessels expand, reducing blood pressure. This type of fainting spell is generally considered harmless. However, injuries can occur if a person takes a  sudden fall during a fainting spell.  CAUSES  Vasovagal syncope occurs when a person's blood pressure and heart rate decrease suddenly, usually in response to a trigger. Many things and situations can trigger an episode. Some of these include:   Pain.   Fear.   The sight of blood or medical procedures, such as blood being drawn from a vein.   Common activities, such as coughing, swallowing, stretching, or going to the bathroom.   Emotional stress.   Prolonged standing, especially in a warm environment.   Lack of sleep or  rest.   Prolonged lack of food.   Prolonged lack of fluids.   Recent illness.  The use of certain drugs that affect blood pressure, such as cocaine, alcohol, marijuana, inhalants, and opiates.  SYMPTOMS  Before the fainting episode, you may:   Feel dizzy or light headed.   Become pale.  Sense that you are going to faint.   Feel like the room is spinning.   Have tunnel vision, only seeing directly in front of you.   Feel sick to your stomach (nauseous).   See spots or slowly lose vision.   Hear ringing in your ears.   Have a headache.   Feel warm and sweaty.   Feel a sensation of pins and needles. During the fainting spell, you will generally be unconscious for no longer than a couple minutes before waking up and returning to normal. If you get up too quickly before your body can recover, you may faint again. Some twitching or jerky movements may occur during the fainting spell.  DIAGNOSIS  Your health care provider will ask about your symptoms, take a medical history, and perform a physical exam. Various tests may be done to rule out other causes of fainting. These may include blood tests and tests to check the heart, such as electrocardiography, echocardiography, and possibly an electrophysiology study. When other causes have been ruled out, a test may be done to check the body's response to changes in position (tilt table  test). TREATMENT  Most cases of vasovagal syncope do not require treatment. Your health care provider may recommend ways to avoid fainting triggers and may provide home strategies for preventing fainting. If you must be exposed to a possible trigger, you can drink additional fluids to help reduce your chances of having an episode of vasovagal syncope. If you have warning signs of an oncoming episode, you can respond by positioning yourself favorably (lying down). If your fainting spells continue, you may be given medicines to prevent fainting. Some medicines may help make you more resistant to repeated episodes of vasovagal syncope. Special exercises or compression stockings may be recommended. In rare cases, the surgical placement of a pacemaker is considered. HOME CARE INSTRUCTIONS   Learn to identify the warning signs of vasovagal syncope.   Sit or lie down at the first warning sign of a fainting spell. If sitting, put your head down between your legs. If you lie down, swing your legs up in the air to increase blood flow to the brain.   Avoid hot tubs and saunas.  Avoid prolonged standing.  Drink enough fluids to keep your urine clear or pale yellow. Avoid caffeine.  Increase salt in your diet as directed by your health care provider.   If you have to stand for a long time, perform movements such as:   Crossing your legs.   Flexing and stretching your leg muscles.   Squatting.   Moving your legs.   Bending over.   Only take over-the-counter or prescription medicines as directed by your health care provider. Do not suddenly stop any medicines without asking your health care provider first. [place] IF:   Your fainting spells continue or happen more frequently in spite of treatment.   You lose consciousness for more than a couple minutes.  You have fainting spells during or after exercising or after being startled.   You have new symptoms that occur with  the fainting spells, such as:   Shortness of breath.  Chest pain.  Irregular heartbeat.   You have episodes of twitching or jerky movements that last longer than a few seconds.  You have episodes of twitching or jerky movements without obvious fainting. SEEK IMMEDIATE MEDICAL CARE IF:   You have injuries or bleeding after a fainting spell.   You have episodes of twitching or jerky movements that last longer than 5 minutes.   You have more than one spell of twitching or jerky movements before returning to consciousness after fainting.   This information is not intended to replace advice given to you by your health care provider. Make sure you discuss any questions you have with your health care provider.   Document Released: 01/10/2012 Document Revised: 06/09/2014 Document Reviewed: 01/10/2012 Elsevier Interactive Patient Education [employer].

## 2015-03-27 NOTE — ED Provider Notes (Signed)
  Face-to-face evaluation   History: Patient with cough for 2 weeks, productive of white sputum. Today, right-handed, had abdominal cramping, and dizziness. Someone with herself and passed out, but there is no clear documentation for that. Patient evaluated by me at 17:20. She is calm, comfortable and pain-free at this time. Blood pressure 123/77.  Physical exam: Alert, calm, cooperative. Heart regular rate and rhythm, no murmur. Lungs clear anteriorly. Abdomen soft, nontender to palpation.  Medical screening examination/treatment/procedure(s) were conducted as a shared visit with non-physician practitioner(s) and myself.  I personally evaluated the patient during the encounter  Daleen Bo, MD 03/28/15 [phone]

## 2015-03-27 NOTE — ED Provider Notes (Signed)
Care assumed from [place], [place], at shift change. Pt here with diarrhea this morning and presyncopal episode in church this morning while she was coughing, presumed to be vasovagal from coughing. She's had the cough x3wks. She reported feeling dehydrated, was found to be mildly tachycardic which didn't really improve after fluids, so a d-dimer was ordered which was positive so a CTA was ordered and is pending. Results so far show trop neg, Dimer 1.65, CMP with mildly low K at 3.3, lipase WNL, CBC w/diff unremarkable aside from mildly low Hgb 11.7, CXR clear, EKG with some nonspecific T wave changes/TWI. Awaiting CTA. Plan is to discharge home as long as CTA is neg, with close PCP f/up and BRAT diet. She recently had cardiac clearance with normal nuc stress test last week.  Physical Exam  BP 123/77 mmHg  Pulse 102  Temp(Src) 98.2 F (36.8 C) (Oral)  Resp 22  SpO2 97%  Physical Exam Gen: afebrile, VSS, NAD HEENT: EOMI, mildly dry mucous membranes Resp: CTAB in all lung fields, no w/r/r, no hypoxia or increased WOB, speaking in full sentences, SpO2 97-100% on RA  CV: rate 98-99 in room, reg rhythm, nl s1/s2, no m/r/g, distal pulses intact, no pedal edema Abd: Soft, NTND, +BS throughout, no r/g/r, neg murphy's, neg mcburney's, no CVA TTP  MsK: moving all extremities with ease Neuro: A&O x4  ED Course  Procedures Results for orders placed or performed during the hospital encounter of 03/27/15  Comprehensive metabolic panel  Result Value Ref Range   Sodium 142 135 - 145 mmol/L   Potassium 3.3 (L) 3.5 - 5.1 mmol/L   Chloride 106 101 - 111 mmol/L   CO2 26 22 - 32 mmol/L   Glucose, Bld 108 (H) 65 - 99 mg/dL   BUN 8 6 - 20 mg/dL   Creatinine, Ser 0.77 0.44 - 1.00 mg/dL   Calcium 8.9 8.9 - 10.3 mg/dL   Total Protein 7.8 6.5 - 8.1 g/dL   Albumin 3.8 3.5 - 5.0 g/dL   AST 18 15 - 41 U/L   ALT 18 14 - 54 U/L   Alkaline Phosphatase 67 38 - 126 U/L   Total Bilirubin 0.8 0.3 - 1.2 mg/dL   GFR  calc non Af Amer >60 >60 mL/min   GFR calc Af Amer >60 >60 mL/min   Anion gap 10 5 - 15  Lipase, blood  Result Value Ref Range   Lipase 25 11 - 51 U/L  CBC with Differential  Result Value Ref Range   WBC 7.5 4.0 - 10.5 K/uL   RBC 4.09 3.87 - 5.11 MIL/uL   Hemoglobin 11.7 (L) 12.0 - 15.0 g/dL   HCT 36.3 36.0 - 46.0 %   MCV 88.8 78.0 - 100.0 fL   MCH 28.6 26.0 - 34.0 pg   MCHC 32.2 30.0 - 36.0 g/dL   RDW 14.2 11.5 - 15.5 %   Platelets 188 150 - 400 K/uL   Neutrophils Relative % 79 %   Neutro Abs 5.9 1.7 - 7.7 K/uL   Lymphocytes Relative 13 %   Lymphs Abs 1.0 0.7 - 4.0 K/uL   Monocytes Relative 7 %   Monocytes Absolute 0.5 0.1 - 1.0 K/uL   Eosinophils Relative 1 %   Eosinophils Absolute 0.1 0.0 - 0.7 K/uL   Basophils Relative 0 %   Basophils Absolute 0.0 0.0 - 0.1 K/uL  Troponin I  Result Value Ref Range   Troponin I <0.03 <0.031 ng/mL  D-dimer, quantitative (  not at [facility])  Result Value Ref Range   D-Dimer, Quant 1.65 (H) 0.00 - 0.50 ug/mL-FEU   Dg Chest 2 View  03/27/2015  CLINICAL DATA:  Frequent diarrhea 3 times this am, diaphoretic now resolved. Also has had a cough x 3 weeks. H/o HTN, stroke. Nonsmoker. EXAM: CHEST  2 VIEW COMPARISON:  02/28/2015 FINDINGS: Cardiac silhouette is normal in size and configuration. No mediastinal or hilar masses or evidence of adenopathy. Clear lungs.  No pleural effusion or pneumothorax. Bony thorax is grossly intact. IMPRESSION: No active cardiopulmonary disease. Electronically Signed   By: Lajean Manes M.D.   On: 03/27/2015 15:26   Ct Angio Chest Pe W/cm &/or Wo Cm  03/27/2015  CLINICAL DATA:  Intermittent dizziness diaphoresis shortness of breath early today, currently improved EXAM: CT ANGIOGRAPHY CHEST WITH CONTRAST TECHNIQUE: Multidetector CT imaging of the chest was performed using the standard protocol during bolus administration of intravenous contrast. Multiplanar CT image reconstructions and MIPs were obtained to evaluate the vascular  anatomy. CONTRAST:  122mL OMNIPAQUE IOHEXOL 350 MG/ML SOLN COMPARISON:  03/27/2015 chest radiograph FINDINGS: Mediastinum/Nodes: Thoracic aorta shows no dissection or dilatation. No filling defects in the pulmonary arterial system although third order lower lobe branches are seen in limited detail due to motion artifact. No pericardial effusion. No pathologic hilar or mediastinal adenopathy. Thoracic inlet normal.  She heart size normal. Lungs/Pleura: No consolidation or infiltrate.  No pleural effusion. Upper abdomen: No acute findings Musculoskeletal: No acute findings Review of the MIP images confirms the above findings. IMPRESSION: No evidence of acute abnormality. No evidence of pulmonary embolism. Mildly limited evaluation of the pulmonary arterial system due to motion artifact obscuring lower lobe arteries. Electronically Signed   By: Skipper Cliche M.D.   On: 03/27/2015 20:02   US Abdomen Complete  02/28/2015  CLINICAL DATA:  65 year old female with right abdominal pain, nausea and vomiting. EXAM: ABDOMEN ULTRASOUND COMPLETE COMPARISON:  None. FINDINGS: Evaluation is limited due to patient's body habitus and overlying bowel gas. Gallbladder: There multiple stones within the gallbladder. No gallbladder wall thickening or pericholecystic fluid. Negative sonographic Murphy's sign. Common bile duct: Diameter: Top normal diameter 8 mm. Liver: Heterogeneous echotexture with increased echogenicity likely related to underlying fatty infiltration. IVC: Not visualized. Pancreas: The visualized portion of the head of the pancreas appear grossly unremarkable. Spleen: Size and appearance within normal limits. Right Kidney: Length: 12 cm. No hydronephrosis on limited evaluation. Left Kidney: Length: 13 cm go. No hydronephrosis on limited evaluation. Abdominal aorta: Not visualized Other findings: None. IMPRESSION: Cholelithiasis without sonographic evidence of acute cholecystitis. Fatty liver. Electronically Signed    By: Anner Crete M.D.   On: 02/28/2015 06:02   Dg Chest Portable 1 View  02/28/2015  CLINICAL DATA:  65 year old female with chest pain and shortness of breath EXAM: PORTABLE CHEST 1 VIEW COMPARISON:  Radiograph dated 08/24/2013 FINDINGS: Single portable view of the chest demonstrates mild increased density at the left lung base which may represent atelectatic changes. Pneumonia is not excluded. Clinical correlation and follow-up recommended. No pleural effusion or pneumothorax. Stable cardiac silhouette. The osseous structures appear unremarkable. IMPRESSION: Left lung base atelectasis versus pneumonia. Follow-up recommended . Electronically Signed   By: Anner Crete M.D.   On: 02/28/2015 04:13   EKG Interpretation  Date/Time:  Saturday March 27 2015 14:33:13 EST Ventricular Rate:  96 PR Interval:  192 QRS Duration: 88 QT Interval:  338 QTC Calculation: 427 R Axis:   49 Text Interpretation:  Sinus rhythm Nonspecific  T abnormalities, diffuse leads changed from previous 02/28/15 Confirmed by Wilson Singer  MD, [place] ([id number]) on 03/27/2015 5:20:48 PM   Meds ordered this encounter  Medications  . sodium chloride 0.9 % bolus 1,000 mL    Sig:   . potassium chloride SA (K-DUR,KLOR-CON) CR tablet 40 mEq    Sig:   . sodium chloride 0.9 % bolus 500 mL    Sig:   . iohexol (OMNIPAQUE) 350 MG/ML injection 100 mL    Sig:      MDM:   ICD-9-CM ICD-10-CM   1. Near syncope 780.2 R55   2. Dyspnea 786.09 R06.00   3. Cough 786.2 R05   4. Diarrhea, unspecified type 787.91 R19.7   5. Hypokalemia 276.8 E87.6   6. Vasovagal syncope 780.2 R55   7. Chronic anemia 285.9 D64.9     5:30 PM- no complaints at this time,no ongoing lightheadedness. Updated on what we're awaiting, and will reassess after CTA.  8:31 PM Pt ambulated to restroom without recurrent symptoms. Feels well. CTA neg for acute findings or PE. Pt feels well. HR on my assessment prior to her discharge was 102-105, I feel she can go  home and continue hydrating orally but doubt need for ongoing fluids here. Discussed close f/up with PCP. BRAT diet discussed, OTC meds for her cough (which she states is chronic x many years), and stay hydrated with fluids. I explained the diagnosis and have given explicit precautions to return to the ER including for any other new or worsening symptoms. The patient understands and accepts the medical plan as it's been dictated and I have answered their questions. Discharge instructions concerning home care and prescriptions have been given. The patient is STABLE and is discharged to home in good condition.  BP 117/89 mmHg  Pulse 111  Temp(Src) 99.8 F (37.7 C) (Oral)  Resp 20  SpO2 95%    Jakeim Sedore Camprubi-Soms, PA-C 03/27/15 2033  Virgel Manifold, MD 04/01/15 [phone]

## 2015-03-29 ENCOUNTER — Telehealth: Payer: Self-pay | Admitting: Cardiovascular Disease

## 2015-03-29 NOTE — Telephone Encounter (Signed)
Pt daughter called wanting to schedule her gallbladder surgery.  She was informed that we are not the MD office that would be handling that she will need to talk pt PCP and surgeon to disucss scheduling this.  She verbalized understanding no questions at this time.

## 2015-03-29 NOTE — Telephone Encounter (Signed)
Please call,she wants to talk to Dr Gwenlyn Found about her mother. She went to [facility] ER on Saturday with a blood pressure of 80/50.When is the gallbladder surgery going to be scheduled?

## 2015-03-30 DIAGNOSIS — I6300 Cerebral infarction due to thrombosis of unspecified precerebral artery: Secondary | ICD-10-CM | POA: Diagnosis not present

## 2015-03-30 DIAGNOSIS — E860 Dehydration: Secondary | ICD-10-CM | POA: Diagnosis not present

## 2015-03-30 DIAGNOSIS — I10 Essential (primary) hypertension: Secondary | ICD-10-CM | POA: Diagnosis not present

## 2015-03-30 DIAGNOSIS — J398 Other specified diseases of upper respiratory tract: Secondary | ICD-10-CM | POA: Diagnosis not present

## 2015-04-05 ENCOUNTER — Ambulatory Visit: Payer: Self-pay | Admitting: General Surgery

## 2015-04-07 NOTE — Patient Instructions (Addendum)
YOUR PROCEDURE IS SCHEDULED ON : 04/12/15  REPORT TO [place] MAIN ENTRANCE FOLLOW SIGNS TO EAST ELEVATOR - GO TO 3rd FLOOR CHECK IN AT 3 EAST NURSES STATION (SHORT STAY) AT:  12:30 PM  CALL THIS NUMBER IF YOU HAVE PROBLEMS THE MORNING OF SURGERY [phone]  REMEMBER:ONLY 1 PER PERSON MAY GO TO SHORT STAY WITH YOU TO GET READY THE MORNING OF YOUR SURGERY  DO NOT EAT FOOD  AFTER MIDNIGHT   MAY HAVE CLEAR LIQUIDS UNTIL 8:30 AM  STOP ASPIRIN / IBUPROFEN / ALEVE / VITAMINS / HERBAL MEDS __5_ DAYS BEFORE SURGERY  TAKE THESE MEDICINES THE MORNING OF SURGERY:NONE  CLEAR LIQUID DIET  Foods Allowed                                                                     Foods Excluded  Coffee and tea, regular and decaf                             liquids that you cannot  Plain Jell-O in any flavor                                             see through such as: Fruit ices (not with fruit pulp)                                     milk, soups, orange juice  Iced Popsicles                                                      All solid food Carbonated beverages, regular and diet                                    Cranberry, grape and apple juices Sports drinks like Gatorade Lightly seasoned clear broth or consume(fat free) Sugar, honey syrup _____________________________________________________________________  YOU MAY NOT HAVE ANY METAL ON YOUR BODY INCLUDING HAIR PINS AND PIERCING'S. DO NOT WEAR JEWELRY, MAKEUP, LOTIONS, POWDERS OR PERFUMES. DO NOT WEAR NAIL POLISH. DO NOT SHAVE 48 HRS PRIOR TO SURGERY. MEN MAY SHAVE FACE AND NECK.  DO NOT [place]. [facility] IS NOT RESPONSIBLE FOR VALUABLES.  CONTACTS, DENTURES OR PARTIALS MAY NOT BE WORN TO SURGERY. LEAVE SUITCASE IN CAR. CAN BE BROUGHT TO ROOM AFTER SURGERY.  PATIENTS DISCHARGED THE DAY OF SURGERY WILL NOT BE ALLOWED TO DRIVE HOME.  PLEASE READ OVER THE FOLLOWING INSTRUCTION  SHEETS _________________________________________________________________________________                                          [place]  Before surgery, you can play an important role.  Because skin is not sterile, your skin needs to be as free of germs as possible.  You can reduce the number of germs on your skin by washing with CHG (chlorahexidine gluconate) soap before surgery.  CHG is an antiseptic cleaner which kills germs and bonds with the skin to continue killing germs even after washing. Please DO NOT use if you have an allergy to CHG or antibacterial soaps.  If your skin becomes reddened/irritated stop using the CHG and inform your nurse when you arrive at Short Stay. Do not shave (including legs and underarms) for at least 48 hours prior to the first CHG shower.  You may shave your face. Please follow these instructions carefully:   1.  Shower with CHG Soap the night before surgery and the  morning of Surgery.   2.  If you choose to wash your hair, wash your hair first as usual with your  normal  Shampoo.   3.  After you shampoo, rinse your hair and body thoroughly to remove the  shampoo.                                         4.  Use CHG as you would any other liquid soap.  You can apply chg directly  to the skin and wash . Gently wash with scrungie or clean wascloth    5.  Apply the CHG Soap to your body ONLY FROM THE NECK DOWN.   Do not use on open                           Wound or open sores. Avoid contact with eyes, ears mouth and genitals (private parts).                        Genitals (private parts) with your normal soap.              6.  Wash thoroughly, paying special attention to the area where your surgery  will be performed.   7.  Thoroughly rinse your body with warm water from the neck down.   8.  DO NOT shower/wash with your normal soap after using and rinsing off  the CHG Soap .                9.  Pat yourself dry with a clean  towel.             10.  Wear clean night clothes to bed after shower             11.  Place clean sheets on your bed the night of your first shower and do not  sleep with pets.  Day of Surgery : Do not apply any lotions/deodorants the morning of surgery.  Please wear clean clothes to the hospital/surgery center.  FAILURE TO FOLLOW THESE INSTRUCTIONS MAY RESULT IN THE CANCELLATION OF YOUR SURGERY    PATIENT SIGNATURE_________________________________  ______________________________________________________________________

## 2015-04-08 ENCOUNTER — Encounter (HOSPITAL_COMMUNITY): Payer: Self-pay

## 2015-04-08 ENCOUNTER — Encounter (HOSPITAL_COMMUNITY)
Admission: RE | Admit: 2015-04-08 | Discharge: 2015-04-08 | Disposition: A | Discharge status: Home / Self Care | Payer: Commercial Managed Care - HMO | Source: Ambulatory Visit | Attending: General Surgery | Admitting: General Surgery

## 2015-04-08 DIAGNOSIS — Z01818 Encounter for other preprocedural examination: Secondary | ICD-10-CM | POA: Insufficient documentation

## 2015-04-08 DIAGNOSIS — K8020 Calculus of gallbladder without cholecystitis without obstruction: Secondary | ICD-10-CM | POA: Diagnosis not present

## 2015-04-08 HISTORY — DX: Cough: R05

## 2015-04-08 HISTORY — DX: Calculus of gallbladder without cholecystitis without obstruction: K80.20

## 2015-04-08 HISTORY — DX: Sleep disorder, unspecified: G47.9

## 2015-04-08 NOTE — Progress Notes (Signed)
   04/08/15 1100  OBSTRUCTIVE SLEEP APNEA  Have you ever been diagnosed with sleep apnea through a sleep study? No  Do you snore loudly (loud enough to be heard through closed doors)?  1  Do you often feel tired, fatigued, or sleepy during the daytime (such as falling asleep during driving or talking to someone)? 1  Has anyone observed you stop breathing during your sleep? 1  Do you have, or are you being treated for high blood pressure? 1  BMI more than 35 kg/m2? 1  Age > 50 (1-yes) 1  Neck circumference greater than:Female 16 inches or larger, Female 17inches or larger? 0  Female Gender (Yes=1) 0  Obstructive Sleep Apnea Score 6

## 2015-04-12 ENCOUNTER — Ambulatory Visit (HOSPITAL_COMMUNITY): Payer: Commercial Managed Care - HMO | Admitting: Certified Registered Nurse Anesthetist

## 2015-04-12 ENCOUNTER — Ambulatory Visit (HOSPITAL_COMMUNITY): Payer: Commercial Managed Care - HMO

## 2015-04-12 ENCOUNTER — Observation Stay (HOSPITAL_COMMUNITY)
Admission: RE | Admit: 2015-04-12 | Discharge: 2015-04-13 | Disposition: A | Discharge status: Home / Self Care | Payer: Commercial Managed Care - HMO | Source: Ambulatory Visit | Attending: General Surgery | Admitting: General Surgery

## 2015-04-12 ENCOUNTER — Encounter (HOSPITAL_COMMUNITY)
Admission: RE | Disposition: A | Discharge status: Home / Self Care | Payer: Self-pay | Source: Ambulatory Visit | Attending: General Surgery

## 2015-04-12 ENCOUNTER — Encounter (HOSPITAL_COMMUNITY): Payer: Self-pay | Admitting: *Deleted

## 2015-04-12 DIAGNOSIS — K8012 Calculus of gallbladder with acute and chronic cholecystitis without obstruction: Principal | ICD-10-CM | POA: Insufficient documentation

## 2015-04-12 DIAGNOSIS — I10 Essential (primary) hypertension: Secondary | ICD-10-CM | POA: Insufficient documentation

## 2015-04-12 DIAGNOSIS — Z23 Encounter for immunization: Secondary | ICD-10-CM | POA: Diagnosis not present

## 2015-04-12 DIAGNOSIS — Z87891 Personal history of nicotine dependence: Secondary | ICD-10-CM | POA: Insufficient documentation

## 2015-04-12 DIAGNOSIS — E7800 Pure hypercholesterolemia, unspecified: Secondary | ICD-10-CM | POA: Diagnosis not present

## 2015-04-12 DIAGNOSIS — K660 Peritoneal adhesions (postprocedural) (postinfection): Secondary | ICD-10-CM | POA: Diagnosis not present

## 2015-04-12 DIAGNOSIS — I69954 Hemiplegia and hemiparesis following unspecified cerebrovascular disease affecting left non-dominant side: Secondary | ICD-10-CM | POA: Diagnosis not present

## 2015-04-12 DIAGNOSIS — E785 Hyperlipidemia, unspecified: Secondary | ICD-10-CM | POA: Diagnosis present

## 2015-04-12 DIAGNOSIS — K811 Chronic cholecystitis: Secondary | ICD-10-CM | POA: Diagnosis present

## 2015-04-12 DIAGNOSIS — K8020 Calculus of gallbladder without cholecystitis without obstruction: Secondary | ICD-10-CM | POA: Diagnosis not present

## 2015-04-12 DIAGNOSIS — R52 Pain, unspecified: Secondary | ICD-10-CM

## 2015-04-12 DIAGNOSIS — M1990 Unspecified osteoarthritis, unspecified site: Secondary | ICD-10-CM | POA: Diagnosis not present

## 2015-04-12 DIAGNOSIS — J449 Chronic obstructive pulmonary disease, unspecified: Secondary | ICD-10-CM | POA: Diagnosis not present

## 2015-04-12 DIAGNOSIS — K8066 Calculus of gallbladder and bile duct with acute and chronic cholecystitis without obstruction: Secondary | ICD-10-CM | POA: Diagnosis not present

## 2015-04-12 DIAGNOSIS — J4540 Moderate persistent asthma, uncomplicated: Secondary | ICD-10-CM | POA: Diagnosis present

## 2015-04-12 HISTORY — PX: CHOLECYSTECTOMY: SHX55

## 2015-04-12 SURGERY — LAPAROSCOPIC CHOLECYSTECTOMY WITH INTRAOPERATIVE CHOLANGIOGRAM
Anesthesia: General

## 2015-04-12 MED ORDER — LIDOCAINE HCL (CARDIAC) 20 MG/ML IV SOLN
INTRAVENOUS | Status: AC
  Filled 2015-04-12: qty 5

## 2015-04-12 MED ORDER — MIDAZOLAM HCL 2 MG/2ML IJ SOLN
INTRAMUSCULAR | Status: AC
  Filled 2015-04-12: qty 2

## 2015-04-12 MED ORDER — DIPHENHYDRAMINE HCL 12.5 MG/5ML PO ELIX: 12.5000 mg | ORAL_SOLUTION | Freq: Four times a day (QID) | ORAL | Status: DC | PRN

## 2015-04-12 MED ORDER — 0.9 % SODIUM CHLORIDE (POUR BTL) OPTIME
TOPICAL | Status: DC | PRN
  Administered 2015-04-12: 1000 mL

## 2015-04-12 MED ORDER — ACETAMINOPHEN 160 MG/5ML PO SOLN: 325.0000 mg | ORAL | Status: DC | PRN

## 2015-04-12 MED ORDER — ONDANSETRON HCL 4 MG/2ML IJ SOLN
INTRAMUSCULAR | Status: DC | PRN
  Administered 2015-04-12: 4 mg via INTRAVENOUS

## 2015-04-12 MED ORDER — PROMETHAZINE HCL 25 MG/ML IJ SOLN: 12.5000 mg | Freq: Four times a day (QID) | INTRAMUSCULAR | Status: DC | PRN

## 2015-04-12 MED ORDER — ACETAMINOPHEN 650 MG RE SUPP: 650.0000 mg | Freq: Four times a day (QID) | RECTAL | Status: DC | PRN

## 2015-04-12 MED ORDER — LACTATED RINGERS IV SOLN
INTRAVENOUS | Status: DC | PRN
  Administered 2015-04-12 (×2): via INTRAVENOUS

## 2015-04-12 MED ORDER — BUPIVACAINE-EPINEPHRINE 0.25% -1:200000 IJ SOLN
INTRAMUSCULAR | Status: DC | PRN
  Administered 2015-04-12: 30 mL

## 2015-04-12 MED ORDER — METOPROLOL TARTRATE 25 MG PO TABS
25.0000 mg | ORAL_TABLET | Freq: Every day | ORAL | Status: DC
  Administered 2015-04-12 – 2015-04-13 (×2): 25 mg via ORAL
  Filled 2015-04-12 (×2): qty 1

## 2015-04-12 MED ORDER — PANTOPRAZOLE SODIUM 40 MG IV SOLR
40.0000 mg | Freq: Every day | INTRAVENOUS | Status: DC
  Administered 2015-04-12: 40 mg via INTRAVENOUS
  Filled 2015-04-12 (×2): qty 40

## 2015-04-12 MED ORDER — LACTATED RINGERS IV SOLN: INTRAVENOUS | Status: DC

## 2015-04-12 MED ORDER — SUGAMMADEX SODIUM 200 MG/2ML IV SOLN
INTRAVENOUS | Status: DC | PRN
  Administered 2015-04-12: 200 mg via INTRAVENOUS

## 2015-04-12 MED ORDER — SUGAMMADEX SODIUM 200 MG/2ML IV SOLN
INTRAVENOUS | Status: AC
  Filled 2015-04-12: qty 2

## 2015-04-12 MED ORDER — DEXAMETHASONE SODIUM PHOSPHATE 10 MG/ML IJ SOLN
INTRAMUSCULAR | Status: DC | PRN
  Administered 2015-04-12: 10 mg via INTRAVENOUS

## 2015-04-12 MED ORDER — FENTANYL CITRATE (PF) 100 MCG/2ML IJ SOLN
INTRAMUSCULAR | Status: DC | PRN
  Administered 2015-04-12: 50 ug via INTRAVENOUS
  Administered 2015-04-12: 100 ug via INTRAVENOUS

## 2015-04-12 MED ORDER — LOSARTAN POTASSIUM 50 MG PO TABS
50.0000 mg | ORAL_TABLET | Freq: Every day | ORAL | Status: DC
  Administered 2015-04-12 – 2015-04-13 (×2): 50 mg via ORAL
  Filled 2015-04-12 (×2): qty 1

## 2015-04-12 MED ORDER — PHENYLEPHRINE HCL 10 MG/ML IJ SOLN
INTRAMUSCULAR | Status: DC | PRN
  Administered 2015-04-12 (×3): 40 ug via INTRAVENOUS

## 2015-04-12 MED ORDER — DIPHENHYDRAMINE HCL 50 MG/ML IJ SOLN: 12.5000 mg | Freq: Four times a day (QID) | INTRAMUSCULAR | Status: DC | PRN

## 2015-04-12 MED ORDER — CEFOTETAN DISODIUM-DEXTROSE 2-2.08 GM-% IV SOLR
2.0000 g | INTRAVENOUS | Status: AC
  Administered 2015-04-12: 2 g via INTRAVENOUS

## 2015-04-12 MED ORDER — ENOXAPARIN SODIUM 40 MG/0.4ML ~~LOC~~ SOLN
40.0000 mg | SUBCUTANEOUS | Status: DC
  Administered 2015-04-13: 40 mg via SUBCUTANEOUS
  Filled 2015-04-12 (×2): qty 0.4

## 2015-04-12 MED ORDER — ESMOLOL HCL 100 MG/10ML IV SOLN
INTRAVENOUS | Status: DC | PRN
  Administered 2015-04-12: 30 mg via INTRAVENOUS

## 2015-04-12 MED ORDER — ESMOLOL HCL 100 MG/10ML IV SOLN
INTRAVENOUS | Status: AC
  Filled 2015-04-12: qty 10

## 2015-04-12 MED ORDER — PROPOFOL 10 MG/ML IV BOLUS
INTRAVENOUS | Status: DC | PRN
Start: 2015-04-12 — End: 2015-04-12
  Administered 2015-04-12: 150 mg via INTRAVENOUS
  Administered 2015-04-12: 50 mg via INTRAVENOUS

## 2015-04-12 MED ORDER — BUPIVACAINE-EPINEPHRINE (PF) 0.25% -1:200000 IJ SOLN
INTRAMUSCULAR | Status: AC
  Filled 2015-04-12: qty 30

## 2015-04-12 MED ORDER — EPHEDRINE SULFATE 50 MG/ML IJ SOLN: INTRAMUSCULAR | Status: DC | PRN

## 2015-04-12 MED ORDER — POTASSIUM CHLORIDE IN NACL 20-0.9 MEQ/L-% IV SOLN
INTRAVENOUS | Status: DC
  Administered 2015-04-12: 1000 mL via INTRAVENOUS
  Filled 2015-04-12 (×2): qty 1000

## 2015-04-12 MED ORDER — OXYCODONE HCL 5 MG PO TABS: 5.0000 mg | ORAL_TABLET | Freq: Once | ORAL | Status: DC | PRN

## 2015-04-12 MED ORDER — FENTANYL CITRATE (PF) 250 MCG/5ML IJ SOLN
INTRAMUSCULAR | Status: AC
  Filled 2015-04-12: qty 5

## 2015-04-12 MED ORDER — OXYCODONE HCL 5 MG/5ML PO SOLN
5.0000 mg | Freq: Once | ORAL | Status: DC | PRN
  Filled 2015-04-12: qty 5

## 2015-04-12 MED ORDER — HYDROMORPHONE HCL 2 MG/ML IJ SOLN
INTRAMUSCULAR | Status: AC
  Filled 2015-04-12: qty 1

## 2015-04-12 MED ORDER — MIDAZOLAM HCL 5 MG/5ML IJ SOLN
INTRAMUSCULAR | Status: DC | PRN
  Administered 2015-04-12 (×2): 1 mg via INTRAVENOUS

## 2015-04-12 MED ORDER — ROCURONIUM BROMIDE 100 MG/10ML IV SOLN
INTRAVENOUS | Status: DC | PRN
  Administered 2015-04-12 (×3): 10 mg via INTRAVENOUS
  Administered 2015-04-12: 20 mg via INTRAVENOUS
  Administered 2015-04-12: 50 mg via INTRAVENOUS
  Administered 2015-04-12: 10 mg via INTRAVENOUS

## 2015-04-12 MED ORDER — MORPHINE SULFATE (PF) 2 MG/ML IV SOLN: 1.0000 mg | INTRAVENOUS | Status: DC | PRN

## 2015-04-12 MED ORDER — ZOLPIDEM TARTRATE 5 MG PO TABS: 5.0000 mg | ORAL_TABLET | Freq: Every evening | ORAL | Status: DC | PRN

## 2015-04-12 MED ORDER — PROPOFOL 10 MG/ML IV BOLUS
INTRAVENOUS | Status: AC
  Filled 2015-04-12: qty 20

## 2015-04-12 MED ORDER — CHLORHEXIDINE GLUCONATE 4 % EX LIQD: 1.0000 "application " | Freq: Once | CUTANEOUS | Status: DC

## 2015-04-12 MED ORDER — LACTATED RINGERS IV SOLN
INTRAVENOUS | Status: DC | PRN
  Administered 2015-04-12: 1000 mL

## 2015-04-12 MED ORDER — FENTANYL CITRATE (PF) 100 MCG/2ML IJ SOLN: 25.0000 ug | INTRAMUSCULAR | Status: DC | PRN

## 2015-04-12 MED ORDER — ACETAMINOPHEN 325 MG PO TABS: 325.0000 mg | ORAL_TABLET | ORAL | Status: DC | PRN

## 2015-04-12 MED ORDER — ONDANSETRON HCL 4 MG/2ML IJ SOLN: 4.0000 mg | Freq: Four times a day (QID) | INTRAMUSCULAR | Status: DC | PRN

## 2015-04-12 MED ORDER — SIMETHICONE 80 MG PO CHEW
40.0000 mg | CHEWABLE_TABLET | Freq: Four times a day (QID) | ORAL | Status: DC | PRN
  Filled 2015-04-12: qty 1

## 2015-04-12 MED ORDER — CEFTRIAXONE SODIUM 2 G IJ SOLR
2.0000 g | Freq: Once | INTRAMUSCULAR | Status: AC
  Administered 2015-04-12: 2 g via INTRAVENOUS
  Filled 2015-04-12: qty 2

## 2015-04-12 MED ORDER — PHENYLEPHRINE 40 MCG/ML (10ML) SYRINGE FOR IV PUSH (FOR BLOOD PRESSURE SUPPORT)
PREFILLED_SYRINGE | INTRAVENOUS | Status: AC
  Filled 2015-04-12: qty 10

## 2015-04-12 MED ORDER — ROCURONIUM BROMIDE 100 MG/10ML IV SOLN
INTRAVENOUS | Status: AC
  Filled 2015-04-12: qty 1

## 2015-04-12 MED ORDER — ONDANSETRON HCL 4 MG/2ML IJ SOLN
INTRAMUSCULAR | Status: AC
  Filled 2015-04-12: qty 2

## 2015-04-12 MED ORDER — OXYCODONE HCL 5 MG PO TABS: 5.0000 mg | ORAL_TABLET | ORAL | Status: DC | PRN

## 2015-04-12 MED ORDER — HYDROMORPHONE HCL 1 MG/ML IJ SOLN
INTRAMUSCULAR | Status: DC | PRN
  Administered 2015-04-12: 0.5 mg via INTRAVENOUS

## 2015-04-12 MED ORDER — LIDOCAINE HCL (CARDIAC) 20 MG/ML IV SOLN
INTRAVENOUS | Status: DC | PRN
  Administered 2015-04-12: 80 mg via INTRAVENOUS

## 2015-04-12 MED ORDER — ACETAMINOPHEN 325 MG PO TABS: 650.0000 mg | ORAL_TABLET | Freq: Four times a day (QID) | ORAL | Status: DC | PRN

## 2015-04-12 MED ORDER — CEFOTETAN DISODIUM-DEXTROSE 2-2.08 GM-% IV SOLR
INTRAVENOUS | Status: AC
  Filled 2015-04-12: qty 50

## 2015-04-12 MED ORDER — INFLUENZA VAC SPLIT QUAD 0.5 ML IM SUSY
0.5000 mL | PREFILLED_SYRINGE | INTRAMUSCULAR | Status: AC
  Administered 2015-04-13: 0.5 mL via INTRAMUSCULAR
  Filled 2015-04-12 (×2): qty 0.5

## 2015-04-12 MED ORDER — PNEUMOCOCCAL VAC POLYVALENT 25 MCG/0.5ML IJ INJ
0.5000 mL | INJECTION | INTRAMUSCULAR | Status: AC
  Administered 2015-04-13: 0.5 mL via INTRAMUSCULAR
  Filled 2015-04-12 (×2): qty 0.5

## 2015-04-12 MED ORDER — ONDANSETRON 4 MG PO TBDP: 4.0000 mg | ORAL_TABLET | Freq: Four times a day (QID) | ORAL | Status: DC | PRN

## 2015-04-12 MED ORDER — UMECLIDINIUM-VILANTEROL 62.5-25 MCG/INH IN AEPB
1.0000 | INHALATION_SPRAY | Freq: Every day | RESPIRATORY_TRACT | Status: DC
  Administered 2015-04-13: 1 via RESPIRATORY_TRACT
  Filled 2015-04-12: qty 14

## 2015-04-12 MED ORDER — DEXAMETHASONE SODIUM PHOSPHATE 10 MG/ML IJ SOLN
INTRAMUSCULAR | Status: AC
  Filled 2015-04-12: qty 1

## 2015-04-12 SURGICAL SUPPLY — 55 items
ADH SKN CLS APL DERMABOND .7 (GAUZE/BANDAGES/DRESSINGS) ×1
APL SKNCLS STERI-STRIP NONHPOA (GAUZE/BANDAGES/DRESSINGS) ×1
APL SRG 38 LTWT LNG FL B (MISCELLANEOUS)
APPLICATOR ARISTA FLEXITIP XL (MISCELLANEOUS) IMPLANT
APPLIER CLIP 5 13 M/L LIGAMAX5 (MISCELLANEOUS) ×2
APPLIER CLIP ROT 10 11.4 M/L (STAPLE) ×2
APR CLP MED LRG 11.4X10 (STAPLE) ×1
APR CLP MED LRG 5 ANG JAW (MISCELLANEOUS) ×1
BAG SPEC RTRVL 10 TROC 200 (ENDOMECHANICALS) ×1
BENZOIN TINCTURE PRP APPL 2/3 (GAUZE/BANDAGES/DRESSINGS) ×1 IMPLANT
CABLE HIGH FREQUENCY MONO STRZ (ELECTRODE) ×2 IMPLANT
CHLORAPREP W/TINT 26ML (MISCELLANEOUS) ×2 IMPLANT
CLIP APPLIE 5 13 M/L LIGAMAX5 (MISCELLANEOUS) ×1 IMPLANT
CLIP APPLIE ROT 10 11.4 M/L (STAPLE) IMPLANT
COVER MAYO STAND STRL (DRAPES) ×1 IMPLANT
COVER SURGICAL LIGHT HANDLE (MISCELLANEOUS) ×2 IMPLANT
DECANTER SPIKE VIAL GLASS SM (MISCELLANEOUS) ×2 IMPLANT
DERMABOND ADVANCED (GAUZE/BANDAGES/DRESSINGS) ×1
DERMABOND ADVANCED .7 DNX12 (GAUZE/BANDAGES/DRESSINGS) ×1 IMPLANT
DEVICE TROCAR PUNCTURE CLOSURE (ENDOMECHANICALS) ×1 IMPLANT
DRAPE C-ARM 42X120 X-RAY (DRAPES) ×1 IMPLANT
DRAPE LAPAROSCOPIC ABDOMINAL (DRAPES) ×1 IMPLANT
DRSG TEGADERM 2-3/8X2-3/4 SM (GAUZE/BANDAGES/DRESSINGS) ×1 IMPLANT
ELECT PENCIL ROCKER SW 15FT (MISCELLANEOUS) ×3 IMPLANT
ELECT REM PT RETURN 9FT ADLT (ELECTROSURGICAL) ×2
ELECTRODE REM PT RTRN 9FT ADLT (ELECTROSURGICAL) ×1 IMPLANT
GAUZE SPONGE 2X2 8PLY STRL LF (GAUZE/BANDAGES/DRESSINGS) IMPLANT
GLOVE BIO SURGEON STRL SZ7.5 (GLOVE) ×3 IMPLANT
GLOVE INDICATOR 8.0 STRL GRN (GLOVE) ×2 IMPLANT
GOWN STRL REUS W/TWL XL LVL3 (GOWN DISPOSABLE) ×6 IMPLANT
HEMOSTAT ARISTA ABSORB 3G PWDR (MISCELLANEOUS) IMPLANT
HEMOSTAT SNOW SURGICEL 2X4 (HEMOSTASIS) IMPLANT
KIT BASIN OR (CUSTOM PROCEDURE TRAY) ×2 IMPLANT
L-HOOK LAP DISP 36CM (ELECTROSURGICAL) ×2
LHOOK LAP DISP 36CM (ELECTROSURGICAL) ×1 IMPLANT
PAD POSITIONING PINK XL (MISCELLANEOUS) IMPLANT
POSITIONER SURGICAL ARM (MISCELLANEOUS) IMPLANT
POUCH RETRIEVAL ECOSAC 10 (ENDOMECHANICALS) ×1 IMPLANT
POUCH RETRIEVAL ECOSAC 10MM (ENDOMECHANICALS) ×1
SCISSORS LAP 5X35 DISP (ENDOMECHANICALS) ×2 IMPLANT
SET CHOLANGIOGRAPH MIX (MISCELLANEOUS) ×1 IMPLANT
SET IRRIG TUBING LAPAROSCOPIC (IRRIGATION / IRRIGATOR) ×2 IMPLANT
SLEEVE XCEL OPT CAN 5 100 (ENDOMECHANICALS) ×5 IMPLANT
SPONGE GAUZE 2X2 STER 10/PKG (GAUZE/BANDAGES/DRESSINGS) ×1
SUT MNCRL AB 4-0 PS2 18 (SUTURE) ×2 IMPLANT
SUT VICRYL 0 UR6 27IN ABS (SUTURE) ×1 IMPLANT
TAPE CLOTH 4X10 WHT NS (GAUZE/BANDAGES/DRESSINGS) IMPLANT
TAPE STRIPS DRAPE STRL (GAUZE/BANDAGES/DRESSINGS) ×1 IMPLANT
TOWEL OR 17X26 10 PK STRL BLUE (TOWEL DISPOSABLE) ×2 IMPLANT
TOWEL OR NON WOVEN STRL DISP B (DISPOSABLE) ×2 IMPLANT
TRAY LAPAROSCOPIC (CUSTOM PROCEDURE TRAY) ×2 IMPLANT
TROCAR BLADELESS OPT 5 100 (ENDOMECHANICALS) ×2 IMPLANT
TROCAR XCEL BLUNT TIP 100MML (ENDOMECHANICALS) ×2 IMPLANT
TROCAR XCEL NON-BLD 11X100MML (ENDOMECHANICALS) IMPLANT
TUBING INSUF HEATED (TUBING) ×2 IMPLANT

## 2015-04-12 NOTE — Interval H&P Note (Signed)
History and Physical Interval Note:  04/12/2015 1:54 PM  [name]  has presented today for surgery, with the diagnosis of Symptomatic cholelithiasis  The various methods of treatment have been discussed with the patient and family. After consideration of risks, benefits and other options for treatment, the patient has consented to  Procedure(s): LAPAROSCOPIC CHOLECYSTECTOMY WITH INTRAOPERATIVE CHOLANGIOGRAM (N/A) as a surgical intervention .  The patient's history has been reviewed, patient examined, no change in status, stable for surgery.  I have reviewed the patient's chart and labs.  Questions were answered to the patient's satisfaction.    Leighton Ruff. Redmond Pulling, MD, [place], Bariatric, & Minimally Invasive Surgery [facility], [place]   [facility]

## 2015-04-12 NOTE — H&P (Signed)
[name] 03/04/2015 10:41 AM Location: [facility] Patient #: [id number] DOB: 08/25/1950 Single / Language: [name] / Race: Black or African American Female   History of Present Illness Randall Hiss M. Skylene Deremer MD; 03/04/2015 3:40 PM) Patient words: GB.  The patient is a 65 year old female who presents for evaluation of gall stones. She is referred by Dr. Delora Fuel for evaluation of gallstones. She is accompanied by family member. She states this past Saturday night she had eaten some ribs and baked beans and then developed upper abdominal mainly right-sided pain as well as some epigastric discomfort associated with nausea and vomiting. The pain was intense and didn't get better so she went to the emergency room. An ultrasound found multiple gallstones without any evidence of cholecystitis. Her metabolic panel was within normal limits. She had some mild anemia but no elevated white blood cell count. She reports similar episodes in the past but certainly not as intense. She has had a prior tubal ligation. She does endorse some chest tightness. She also endorses shortness of breath and dyspnea on exertion. She states that she has to lay on pillows at night if not she will get short of breath. She has had a prior stroke in 2015. 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 She uses a cane   Problem List/Past Medical Gayland Curry, MD; 03/04/2015 3:47 PM) SYMPTOMATIC CHOLELITHIASIS [id number])  Other Problems Gayland Curry, MD; 03/04/2015 3:47 PM) Arthritis Back Pain Bladder Problems Cholelithiasis Hypercholesterolemia High blood pressure Cerebrovascular Accident  Past Surgical History (Ammie Eversole, LPN; 075-GRM [id number] AM) Knee Surgery Left. Oral Surgery  Diagnostic Studies History (Ammie Eversole, LPN; 075-GRM [id number] AM) Colonoscopy never Mammogram 1-3 years ago  Allergies (Ammie Eversole, LPN; 075-GRM [id number] AM) No Known  Drug Allergies01/26/2017  Medication History (Ammie Eversole, LPN; 075-GRM QA348G AM) Hydrocodone-Acetaminophen (5-325MG  Tablet, Oral) Active. Oxycodone-Acetaminophen (5-325MG  Tablet, Oral) Active. Atorvastatin Calcium (40MG  Tablet, Oral) Active. Losartan Potassium-HCTZ (50-12.5MG  Tablet, Oral) Active. Metoprolol Tartrate (25MG  Tablet, Oral) Active. Ondansetron HCl (4MG  Tablet, Oral) Active. Aspirin (81MG  Tablet Chewable, Oral) Active. Medications Reconciled  Social History (Aleatha Borer, LPN; 075-GRM [id number] AM) Alcohol use Remotely quit alcohol use. Caffeine use Tea. No drug use Tobacco use Former smoker.  Family History Aleatha Borer, LPN; 075-GRM [id number] AM) Family history unknown First Degree Relatives  Pregnancy / Birth History Aleatha Borer, LPN; 075-GRM [id number] AM) Age of menopause 82-60 Gravida 4 Maternal age 66-20    Review of Systems (Ammie Eversole LPN; 075-GRM [id number] AM) General Not Present- Appetite Loss, Chills, Fatigue, Fever, Night Sweats, Weight Gain and Weight Loss. Skin Not Present- Change in Wart/Mole, Dryness, Hives, Jaundice, New Lesions, Non-Healing Wounds, Rash and Ulcer. HEENT Not Present- Earache, Hearing Loss, Hoarseness, Nose Bleed, Oral Ulcers, Ringing in the Ears, Seasonal Allergies, Sinus Pain, Sore Throat, Visual Disturbances, Wears glasses/contact lenses and Yellow Eyes. Breast Not Present- Breast Mass, Breast Pain, Nipple Discharge and Skin Changes. Cardiovascular Not Present- Chest Pain, Difficulty Breathing Lying Down, Leg Cramps, Palpitations, Rapid Heart Rate, Shortness of Breath and Swelling of Extremities. Female Genitourinary Not Present- Frequency, Nocturia, Painful Urination, Pelvic Pain and Urgency. Musculoskeletal Present- Back Pain, Joint Pain, Joint Stiffness, Muscle Weakness and Swelling of Extremities. Not Present- Muscle Pain. Neurological Present- Headaches, Trouble walking and Weakness. Not Present-  Decreased Memory, Fainting, Numbness, Seizures, Tingling and Tremor. Psychiatric Not Present- Anxiety, Bipolar, Change in Sleep Pattern, Depression, Fearful and Frequent crying. Endocrine Present- Hot flashes. Not Present- Cold Intolerance, Excessive Hunger,  Hair Changes, Heat Intolerance and New Diabetes. Hematology Present- Easy Bruising. Not Present- Excessive bleeding, Gland problems, HIV and Persistent Infections.  Vitals (Ammie Eversole LPN; 075-GRM [id number] AM) 03/04/2015 10:42 AM Weight: 229.8 lb Height: 63in Body Surface Area: 2.05 m Body Mass Index: 40.71 kg/m  Temp.: 99.49F(Oral)  Pulse: 72 (Regular)  BP: 140/80 (Sitting, Left Arm, Standard)       Physical Exam Randall Hiss M. Saniya Tranchina MD; 03/04/2015 3:41 PM) General Mental Status-Alert. General Appearance-Consistent with stated age. Hydration-Well hydrated. Voice-Normal.  Head and Neck Head-normocephalic, atraumatic with no lesions or palpable masses. Trachea-midline. Thyroid Gland Characteristics - normal size and consistency.  Eye Eyeball - Bilateral-Extraocular movements intact. Sclera/Conjunctiva - Bilateral-No scleral icterus.  Chest and Lung Exam Chest and lung exam reveals -quiet, even and easy respiratory effort with no use of accessory muscles and on auscultation, normal breath sounds, no adventitious sounds and normal vocal resonance. Inspection Chest Wall - Normal. Back - normal.  Breast - Did not examine.  Cardiovascular Cardiovascular examination reveals -normal heart sounds, regular rate and rhythm with no murmurs and normal pedal pulses bilaterally.  Abdomen Inspection Inspection of the abdomen reveals - No Hernias. Skin - Scar - Note: small supraumbilical incision. Palpation/Percussion Palpation and Percussion of the abdomen reveal - Soft, Non Tender, No Rebound tenderness, No Rigidity (guarding) and No hepatosplenomegaly. Auscultation Auscultation of the abdomen  reveals - Bowel sounds normal.  Peripheral Vascular Upper Extremity Palpation - Pulses bilaterally normal.  Neurologic Neurologic evaluation reveals -alert and oriented x 3 with no impairment of recent or remote memory. Mental Status-Normal.  Neuropsychiatric The patient's mood and affect are described as -normal. Judgment and Insight-insight is appropriate concerning matters relevant to self.  Musculoskeletal Normal Exam - Left-Upper Extremity Strength Normal and Lower Extremity Strength Normal. Normal Exam - Right-Upper Extremity Strength Normal and Lower Extremity Strength Normal.  Lymphatic Head & Neck  General Head & Neck Lymphatics: Bilateral - Description - Normal. Axillary - Did not examine. Femoral & Inguinal - Did not examine.    Assessment & Plan Randall Hiss M. Timera Windt MD; 03/04/2015 3:47 PM) HISTORY OF STROKE (Z86.73) SYMPTOMATIC CHOLELITHIASIS (K80.20) Impression: I believe the patient's symptoms are consistent with gallbladder disease.  We discussed gallbladder disease. The patient was given Neurosurgeon. We discussed non-operative and operative management. We discussed the signs & symptoms of acute cholecystitis  I discussed laparoscopic cholecystectomy with IOC in detail. The patient was given educational material as well as diagrams detailing the procedure. We discussed the risks and benefits of a laparoscopic cholecystectomy including, but not limited to bleeding, infection, injury to surrounding structures such as the intestine or liver, bile leak, retained gallstones, need to convert to an open procedure, prolonged diarrhea, blood clots such as DVT, common bile duct injury, anesthesia risks, and possible need for additional procedures. We discussed the typical post-operative recovery course. I explained that the likelihood of improvement of their symptoms is good.  The patient has elected to proceed with surgery however given her history of stroke  and her current positive cardiac review of systems she needs to have cardiac clearance prior to scheduling for surgerywilson Current Plans Pt Education - Pamphlet Given - Laparoscopic Gallbladder Surgery: discussed with patient and provided information. I recommended obtaining preoperative cardiac clearance. I am concerned about the health of the patient and the ability to tolerate the operation. Therefore, we will request clearance by cardiology to better assess operative risk & see if a reevaluation, further workup, etc is needed. Also recommendations on how  medications such as for anticoagulation and blood pressure should be managed/held/restarted after surgery. ESSENTIAL HYPERTENSION (I10)  Leighton Ruff. Redmond Pulling, MD, FACS General, Bariatric, & Minimally Invasive Surgery [facility], [place]

## 2015-04-12 NOTE — Anesthesia Postprocedure Evaluation (Signed)
Anesthesia Post Note  Patient: Emberly C Rahrig  Procedure(s) Performed: Procedure(s) (LRB): LAPAROSCOPIC CHOLECYSTECTOMY WITH INTRAOPERATIVE CHOLANGIOGRAM (N/A)  Patient location during evaluation: PACU Anesthesia Type: General Level of consciousness: awake Pain management: pain level controlled Vital Signs Assessment: post-procedure vital signs reviewed and stable Respiratory status: spontaneous breathing, patient connected to nasal cannula oxygen and respiratory function stable Cardiovascular status: stable Postop Assessment: no signs of nausea or vomiting Anesthetic complications: no    Last Vitals:  Filed Vitals:   04/12/15 1647 04/12/15 1745  BP: 132/73   Pulse: 89   Temp: 36.6 C 36.8 C  Resp: 21     Last Pain:  Filed Vitals:   04/12/15 1752  PainSc: 0-No pain                 Malin Sambrano

## 2015-04-12 NOTE — Transfer of Care (Signed)
Immediate Anesthesia Transfer of Care Note  Patient: [name]  Procedure(s) Performed: Procedure(s): LAPAROSCOPIC CHOLECYSTECTOMY WITH INTRAOPERATIVE CHOLANGIOGRAM (N/A)  Patient Location: PACU  Anesthesia Type:General  Level of Consciousness: awake, alert  and oriented  Airway & Oxygen Therapy: Patient Spontanous Breathing and Patient connected to face mask oxygen  Post-op Assessment: Report given to RN and Post -op Vital signs reviewed and stable  Post vital signs: Reviewed and stable  Last Vitals: There were no vitals filed for this visit.  Complications: No apparent anesthesia complications

## 2015-04-12 NOTE — Anesthesia Preprocedure Evaluation (Addendum)
Anesthesia Evaluation  Patient identified by MRN, date of birth, ID band Patient awake    Reviewed: Allergy & Precautions, NPO status , Patient's Chart, lab work & pertinent test results, reviewed documented beta blocker date and time   History of Anesthesia Complications Negative for: history of anesthetic complications  Airway Mallampati: IV  TM Distance: >3 FB Neck ROM: Full    Dental  (+) Edentulous Upper, Edentulous Lower   Pulmonary neg shortness of breath, neg sleep apnea, neg COPD, neg recent URI, former smoker, neg PE   breath sounds clear to auscultation       Cardiovascular hypertension, Pt. on medications and Pt. on home beta blockers  Rhythm:Regular     Neuro/Psych Left sided weakness CVA, Residual Symptoms negative psych ROS   GI/Hepatic Neg liver ROS,   Endo/Other  Morbid obesity  Renal/GU negative Renal ROS     Musculoskeletal  (+) Arthritis ,   Abdominal   Peds  Hematology negative hematology ROS (+)   Anesthesia Other Findings   Reproductive/Obstetrics                            Anesthesia Physical Anesthesia Plan  ASA: III  Anesthesia Plan: General   Post-op Pain Management:    Induction: Intravenous  Airway Management Planned: Oral ETT  Additional Equipment: None  Intra-op Plan:   Post-operative Plan: Extubation in OR  Informed Consent: I have reviewed the patients History and Physical, chart, labs and discussed the procedure including the risks, benefits and alternatives for the proposed anesthesia with the patient or authorized representative who has indicated his/her understanding and acceptance.   Dental advisory given  Plan Discussed with: CRNA and Surgeon  Anesthesia Plan Comments:         Anesthesia Quick Evaluation

## 2015-04-12 NOTE — Op Note (Signed)
[name]:[id number] 1950/05/03 04/12/2015  Laparoscopic Cholecystectomy with IOC Procedure Note  Indications: This patient presents with symptomatic gallbladder disease and will undergo laparoscopic cholecystectomy. Please see H&P for additional details  Pre-operative Diagnosis: Symptomatic cholelithiasis  Post-operative Diagnosis: Chronic calculus cholecystitis  Surgeon: Gayland Curry MD FACS  Assistants: Johnathan Hausen MD FACS  Anesthesia: General endotracheal anesthesia  Procedure Details  The patient was seen again in the Holding Room. The risks, benefits, complications, treatment options, and expected outcomes were discussed with the patient. The possibilities of reaction to medication, pulmonary aspiration, perforation of viscus, bleeding, recurrent infection, finding a normal gallbladder, the need for additional procedures, failure to diagnose a condition, the possible need to convert to an open procedure, and creating a complication requiring transfusion or operation were discussed with the patient. The likelihood of improving the patient's symptoms with return to their baseline status is good.  The patient and/or family concurred with the proposed plan, giving informed consent. The site of surgery properly noted. The patient was taken to Operating Room, identified as Xitlally C Weckwerth and the procedure verified as Laparoscopic Cholecystectomy with Intraoperative Cholangiogram. A Time Out was held and the above information confirmed. Antibiotic prophylaxis was administered.   Prior to the induction of general anesthesia, antibiotic prophylaxis was administered. General endotracheal anesthesia was then administered and tolerated well. After the induction, the abdomen was prepped with Chloraprep and draped in the sterile fashion. The patient was positioned in the supine position.  She had had a prior mini midline incision so I decided to gain access to the abdomen in the left upper quadrant  using Optiview technique. A small half centimeter incision was made about 1 fingerbreadth below the left subcostal margin near the midline. Then using a 0 5 mm laparoscope through a 5 mm trocar I advanced it under direct visualization through all layers of the abdominal wall and smoothly entered the abdominal cavity. Pneumoperitoneum was smoothly established up to a patient pressure of 15 mmHg. The laparoscope was advanced and the abdominal cavity was surveilled. There is no evidence of injury to surrounding structures. There were 2 loops of small bowel attached in the midline just above the umbilicus. A 5 mm trocar was placed just to the right of the midline away from the bowel adhesions under direct visualization. Two 5-mm ports were placed in the right upper quadrant. All skin incisions were infiltrated with a local anesthetic agent before making the incision and placing the trocars.   We positioned the patient in reverse Trendelenburg, tilted slightly to the patient's left. The patient had numerous violin string adhesions between the right lobe of the liver and the abdominal wall. These were taken down with EndoShears. The gallbladder was identified, the fundus grasped and retracted cephalad. Adhesions were lysed bluntly and with the electrocautery where indicated, taking care not to injure any adjacent organs or viscus. The infundibulum was grasped and retracted laterally. The gallbladder was contracted and appeared chronically inflamed. Where it had been grabbed at the fundus there is some spillage of thick yellow fluid. Omental adhesions were taken down with the suction irrigator catheter around the infundibulum. I also used some hydrodissection as well as IT consultant. The gallbladder wall was chronically thickened and inflamed. I was able to expose the peritoneum overlying the triangle of Calot. This was then divided and exposed in a blunt fashion. The cystic artery was very close to the cystic  duct. It appeared that the cystic duct was dilated. I was able to  trace out the cystic artery quite well going into the gallbladder. I felt that I needed to go ahead and take that in order to maximize my visualization of the cystic duct. 2 clips were placed on the proximal aspect of the cystic artery and one distally is in near the gallbladder. The cystic duct was clearly identified and bluntly dissected circumferentially. I continued to mobilize some of the gallbladder to ensure there is no other structures entering the gallbladder. The cystic duct was ligated with a clip distally.   An incision was made in the cystic duct. There is immediate drainage of yellowish thick fluid with tiny gallstones. I was able to milk some of it up out of the common bile duct and cystic duct. I then placed a Cook cholangiogram catheter into the cystic duct. The catheter was secured using a clip. A cholangiogram was then obtained which showed good visualization of the distal and proximal biliary tree with no sign of filling defects or obstruction.  The bile duct appeared dilated however there is no evidence of filling defects. Contrast flowed into the duodenum. The catheter was then removed.   The cystic duct was then ligated with clips and divided.   The gallbladder was dissected from the liver bed in retrograde fashion with the electrocautery. The interface between the posterior wall the gallbladder and the liver surface was obliterated. The gallbladder was entered with spillage of some gallstones. The subxiphoid 5 mm trocar was changed to an 11 mm trocar. The gallbladder was removed and placed in an Ecco sac.  The gallbladder and Ecco sac were then removed through the subxiphoid port site. The liver bed was irrigated and inspected. The spilled stones were removed with the stone grabber as well as with a large suction catheter. Hemostasis was achieved with the electrocautery. 3 L of copious irrigation was utilized and was  repeatedly aspirated until clear. There was no evidence of remaining spilled gallstones.  An Endo Close with 0 Vicryl x2  was used to close the subxiphoid trocar site. There is no air leak.  We again inspected the right upper quadrant for hemostasis.  The subxiphoid closure was inspected and there was no air leak and nothing trapped within the closure. The bowel near the umbilicus was inspected and it was still intact without any signs of injury. Pneumoperitoneum was released as we removed the trocars.  4-0 Monocryl was used to close the skin.   Benzoin, steri-strips, and clean dressings were applied. The patient was then extubated and brought to the recovery room in stable condition. Instrument, sponge, and needle counts were correct at closure and at the conclusion of the case.   Findings: Chronic Cholecystitis with Cholelithiasis; violin string adhesions b/t right lobe of liver and abdominal wall  Estimated Blood Loss: less than 50 mL         Drains: none         Specimens: Gallbladder           Complications: None; patient tolerated the procedure well.         Disposition: PACU - hemodynamically stable.         Condition: stable  Leighton Ruff. Redmond Pulling, MD, FACS General, Bariatric, & Minimally Invasive Surgery [facility], [place]

## 2015-04-12 NOTE — Anesthesia Procedure Notes (Signed)
Procedure Name: Intubation Date/Time: 04/12/2015 2:26 PM Performed by: Glory Buff Pre-anesthesia Checklist: Patient identified, Emergency Drugs available, Suction available and Patient being monitored Patient Re-evaluated:Patient Re-evaluated prior to inductionOxygen Delivery Method: Circle System Utilized Preoxygenation: Pre-oxygenation with 100% oxygen Intubation Type: IV induction Ventilation: Mask ventilation without difficulty Laryngoscope Size: Miller and 3 Grade View: Grade I Tube type: Oral Tube size: 7.5 mm Number of attempts: 1 Airway Equipment and Method: Stylet and Oral airway Placement Confirmation: ETT inserted through vocal cords under direct vision,  positive ETCO2 and breath sounds checked- equal and bilateral Secured at: 21 cm Tube secured with: Tape Dental Injury: Teeth and Oropharynx as per pre-operative assessment

## 2015-04-13 ENCOUNTER — Encounter (HOSPITAL_COMMUNITY): Payer: Self-pay | Admitting: General Surgery

## 2015-04-13 DIAGNOSIS — I69954 Hemiplegia and hemiparesis following unspecified cerebrovascular disease affecting left non-dominant side: Secondary | ICD-10-CM | POA: Diagnosis not present

## 2015-04-13 DIAGNOSIS — K8012 Calculus of gallbladder with acute and chronic cholecystitis without obstruction: Secondary | ICD-10-CM | POA: Diagnosis not present

## 2015-04-13 DIAGNOSIS — Z23 Encounter for immunization: Secondary | ICD-10-CM | POA: Diagnosis not present

## 2015-04-13 DIAGNOSIS — E7800 Pure hypercholesterolemia, unspecified: Secondary | ICD-10-CM | POA: Diagnosis not present

## 2015-04-13 DIAGNOSIS — Z87891 Personal history of nicotine dependence: Secondary | ICD-10-CM | POA: Diagnosis not present

## 2015-04-13 DIAGNOSIS — K660 Peritoneal adhesions (postprocedural) (postinfection): Secondary | ICD-10-CM | POA: Diagnosis not present

## 2015-04-13 DIAGNOSIS — M1990 Unspecified osteoarthritis, unspecified site: Secondary | ICD-10-CM | POA: Diagnosis not present

## 2015-04-13 DIAGNOSIS — I10 Essential (primary) hypertension: Secondary | ICD-10-CM | POA: Diagnosis not present

## 2015-04-13 LAB — CBC
HCT: 32.1 % — ABNORMAL LOW (ref 36.0–46.0)
HEMOGLOBIN: 10.1 g/dL — AB (ref 12.0–15.0)
MCH: 28.1 pg (ref 26.0–34.0)
MCHC: 31.5 g/dL (ref 30.0–36.0)
MCV: 89.4 fL (ref 78.0–100.0)
Platelets: 220 10*3/uL (ref 150–400)
RBC: 3.59 MIL/uL — ABNORMAL LOW (ref 3.87–5.11)
RDW: 14.7 % (ref 11.5–15.5)
WBC: 6.9 10*3/uL (ref 4.0–10.5)

## 2015-04-13 LAB — GLUCOSE, CAPILLARY: GLUCOSE-CAPILLARY: 168 mg/dL — AB (ref 65–99)

## 2015-04-13 MED ORDER — OXYCODONE HCL 5 MG PO TABS: 5.0000 mg | ORAL_TABLET | ORAL | Status: DC | PRN

## 2015-04-13 NOTE — Discharge Instructions (Signed)
[facility] LAPAROSCOPIC SURGERY: POST OP INSTRUCTIONS  Get your blood work done early next week I need to see you within 3 weeks of surgery Always review your discharge instruction sheet given to you by the facility where your surgery was performed. IF YOU HAVE DISABILITY OR FAMILY LEAVE FORMS, YOU MUST BRING THEM TO THE OFFICE FOR PROCESSING.   DO NOT GIVE THEM TO YOUR DOCTOR.  1. A prescription for pain medication may be given to you upon discharge.  Take your pain medication as prescribed, if needed.  If narcotic pain medicine is not needed, then you may take acetaminophen (Tylenol) or ibuprofen (Advil) as needed. 2. Take your usually prescribed medications unless otherwise directed. 3. If you need a refill on your pain medication, please contact your pharmacy.  They will contact our office to request authorization. Prescriptions will not be filled after 5pm or on week-ends. 4. You should follow a light diet the first few days after arrival home, such as soup and crackers, etc.  Be sure to include lots of fluids daily. 5. Most patients will experience some swelling and bruising in the area of the incisions.  Ice packs will help.  Swelling and bruising can take several days to resolve.  6. It is common to experience some constipation if taking pain medication after surgery.  Increasing fluid intake and taking a stool softener (such as Colace) will usually help or prevent this problem from occurring.  A mild laxative (Milk of Magnesia or Miralax) should be taken according to package instructions if there are no bowel movements after 48 hours. 7. Unless discharge instructions indicate otherwise, you may remove your bandages 48 hours after surgery, and you may shower at that time.  You  have steri-strips (small skin tapes) in place directly over the incision.  These strips should be left on the skin for 7-10 days. 8. ACTIVITIES:  You may resume regular (light) daily activities  beginning the next day--such as daily self-care, walking, climbing stairs--gradually increasing activities as tolerated.  You may have sexual intercourse when it is comfortable.  Refrain from any heavy lifting or straining until approved by your doctor. a. You may drive when you are no longer taking prescription pain medication, you can comfortably wear a seatbelt, and you can safely maneuver your car and apply brakes. 9. You should see your doctor in the office for a follow-up appointment approximately 2-3 weeks after your surgery.  Make sure that you call for this appointment within a day or two after you arrive home to insure a convenient appointment time. 10. OTHER INSTRUCTIONS:  WHEN TO CALL YOUR DOCTOR: 1. Fever over 101.0 2. Inability to urinate 3. Continued bleeding from incision. 4. Increased pain, redness, or drainage from the incision. 5. Increasing abdominal pain  The clinic staff is available to answer your questions during regular business hours.  Please dont hesitate to call and ask to speak to one of the nurses for clinical concerns.  If you have a medical emergency, go to the nearest emergency room or call 911.  A surgeon from [facility] is always on call at the hospital. [address] [place], [place], [place]  [address] ? [address], [place], [place]   [address] [phone] ? [phone] ? FAX [phone] Web site: [email or web address]

## 2015-04-13 NOTE — Discharge Summary (Addendum)
Physician Discharge Summary  [name] [id number] DOB: May 18, 1950 DOA: 04/12/2015  PCP: Elyn Peers, MD  Admit date: 04/12/2015 Discharge date: 04/13/2015  Recommendations for Outpatient Follow-up:   Follow-up Information    Follow up with Gayland Curry, MD. Schedule an appointment as soon as possible for a visit in 3 weeks.   Specialty:  General Surgery   Why:  For wound re-check   Contact information:   [place] [address] [phone]      Discharge Diagnoses:  Principal Problem:   Chronic cholecystitis Active Problems:   HYPERTENSION, BENIGN ESSENTIAL   Asthma   Dyslipidemia  Surgical Procedure: laparoscopic cholecystectomy with IOC  Discharge Condition: good Disposition: home  Diet recommendation: cardiac  Filed Weights   04/12/15 2306  Weight: 102 kg (224 lb 13.9 oz)    Hospital Course:  She came in for planned lap chole. Please see op note. She was kept overnight for observation. She did well. On POD 1, she was tolerating a solid food diet without nausea/vomiting. Her pain was well controlled. Her vital signs were stable. She had ambulated. She was deemed stable for discharge. We discussed dc instructions. Her IOC really didn't demonstrate great contrast emptying into the intestine, however there about to be a trickle getting into the small intestine. She had a lot of debris in her cystic duct. Her preop LFTs were normal. Plan is to repeat her LFTs as outpt.   BP 137/64 mmHg  Pulse 81  Temp(Src) 99.1 F (37.3 C) (Oral)  Resp 18  Ht 5\' 3"  (1.6 m)  Wt 102 kg (224 lb 13.9 oz)  BMI 39.84 kg/m2  SpO2 97%  Gen: alert, NAD, non-toxic appearing Pupils: equal, no scleral icterus Pulm: Lungs clear to auscultation, symmetric chest rise CV: regular rate and rhythm Abd: soft,  Min to non tender, nondistended. Dressings c/d/i. No cellulitis. No incisional hernia Ext: no edema, no calf tenderness Skin: no rash, no  jaundice    Discharge Instructions  Discharge Instructions    Call MD for:  hives    Complete by:  As directed      Call MD for:  persistant dizziness or light-headedness    Complete by:  As directed      Call MD for:  persistant nausea and vomiting    Complete by:  As directed      Call MD for:  redness, tenderness, or signs of infection (pain, swelling, redness, odor or green/yellow discharge around incision site)    Complete by:  As directed      Call MD for:  severe uncontrolled pain    Complete by:  As directed      Call MD for:    Complete by:  As directed   Temperature >101     Diet - low sodium heart healthy    Complete by:  As directed      Discharge instructions    Complete by:  As directed   See [facility] discharge instructions     Increase activity slowly    Complete by:  As directed             Medication List    TAKE these medications        ANORO ELLIPTA 62.5-25 MCG/INH Aepb  Generic drug:  umeclidinium-vilanterol  Inhale 1 puff into the lungs daily.     aspirin 81 MG EC tablet  Take 1 tablet (81 mg total) by mouth daily.  atorvastatin 40 MG tablet  Commonly known as:  LIPITOR  Take 1 tablet (40 mg total) by mouth daily at 6 PM.     losartan 50 MG tablet  Commonly known as:  COZAAR  Take 50 mg by mouth daily.     metoprolol tartrate 25 MG tablet  Commonly known as:  LOPRESSOR  Take 25 mg by mouth daily.     oxyCODONE 5 MG immediate release tablet  Commonly known as:  Oxy IR/ROXICODONE  Take 1-2 tablets (5-10 mg total) by mouth every 4 (four) hours as needed for moderate pain.           Follow-up Information    Follow up with Gayland Curry, MD. Schedule an appointment as soon as possible for a visit in 3 weeks.   Specialty:  General Surgery   Why:  For wound re-check   Contact information:   [place] [address] [phone]        The results of significant diagnostics from this hospitalization (including  imaging, microbiology, ancillary and laboratory) are listed below for reference.    Significant Diagnostic Studies: Dg Chest 2 View  03/27/2015  CLINICAL DATA:  Frequent diarrhea 3 times this am, diaphoretic now resolved. Also has had a cough x 3 weeks. H/o HTN, stroke. Nonsmoker. EXAM: CHEST  2 VIEW COMPARISON:  02/28/2015 FINDINGS: Cardiac silhouette is normal in size and configuration. No mediastinal or hilar masses or evidence of adenopathy. Clear lungs.  No pleural effusion or pneumothorax. Bony thorax is grossly intact. IMPRESSION: No active cardiopulmonary disease. Electronically Signed   By: Lajean Manes M.D.   On: 03/27/2015 15:26   Dg Cholangiogram Operative  04/12/2015  CLINICAL DATA:  65 year old female with a history of cholelithiasis EXAM: INTRAOPERATIVE CHOLANGIOGRAM TECHNIQUE: Cholangiographic images from the C-arm fluoroscopic device were submitted for interpretation post-operatively. Please see the procedural report for the amount of contrast and the fluoroscopy time utilized. COMPARISON:  Ultrasound 02/28/2015 FINDINGS: Surgical instruments project over the upper abdomen.There is cannulation of the cystic duct/gallbladder neck, with antegrade infusion of contrast. However bile duct appears generous in caliber. No filling defects identified, however, contrast is not visualized to cross the ampulla. IMPRESSION: Intraoperative cholangiogram demonstrates extrahepatic biliary ducts of generous diameter. No contrast is visualized to cross the ampulla, potentially representing a stone, stricture, or soft tissue destruction. Please refer to the dictated operative report for full details of intraoperative findings and procedure Signed, Dulcy Fanny. Earleen Newport, DO [facility] Electronically Signed   By: Corrie Mckusick D.O.   On: 04/12/2015 16:15   Ct Angio Chest Pe W/cm &/or Wo Cm  03/27/2015  CLINICAL DATA:  Intermittent dizziness diaphoresis  shortness of breath early today, currently improved EXAM: CT ANGIOGRAPHY CHEST WITH CONTRAST TECHNIQUE: Multidetector CT imaging of the chest was performed using the standard protocol during bolus administration of intravenous contrast. Multiplanar CT image reconstructions and MIPs were obtained to evaluate the vascular anatomy. CONTRAST:  133mL OMNIPAQUE IOHEXOL 350 MG/ML SOLN COMPARISON:  03/27/2015 chest radiograph FINDINGS: Mediastinum/Nodes: Thoracic aorta shows no dissection or dilatation. No filling defects in the pulmonary arterial system although third order lower lobe branches are seen in limited detail due to motion artifact. No pericardial effusion. No pathologic hilar or mediastinal adenopathy. Thoracic inlet normal.  She heart size normal. Lungs/Pleura: No consolidation or infiltrate.  No pleural effusion. Upper abdomen: No acute findings Musculoskeletal: No acute findings Review of the MIP images confirms the above  findings. IMPRESSION: No evidence of acute abnormality. No evidence of pulmonary embolism. Mildly limited evaluation of the pulmonary arterial system due to motion artifact obscuring lower lobe arteries. Electronically Signed   By: Skipper Cliche M.D.   On: 03/27/2015 20:02    Microbiology: No results found for this or any previous visit (from the past 240 hour(s)).   Labs: Basic Metabolic Panel: No results for input(s): NA, K, CL, CO2, GLUCOSE, BUN, CREATININE, CALCIUM, MG, PHOS in the last 168 hours. Liver Function Tests: No results for input(s): AST, ALT, ALKPHOS, BILITOT, PROT, ALBUMIN in the last 168 hours. No results for input(s): LIPASE, AMYLASE in the last 168 hours. No results for input(s): AMMONIA in the last 168 hours. CBC:  Recent Labs Lab 04/13/15 0403  WBC 6.9  HGB 10.1*  HCT 32.1*  MCV 89.4  PLT 220   Cardiac Enzymes: No results for input(s): CKTOTAL, CKMB, CKMBINDEX, TROPONINI in the last 168 hours. BNP: BNP (last 3 results) No results for  input(s): BNP in the last 8760 hours.  ProBNP (last 3 results) No results for input(s): PROBNP in the last 8760 hours.  CBG:  Recent Labs Lab 04/12/15 2359  GLUCAP 168*    Active Problems:   Chronic cholecystitis   Time coordinating discharge: 15 minutes  Signed:  Gayland Curry, MD [facility], [place] [phone] 04/13/2015, 9:00 AM

## 2015-04-13 NOTE — Progress Notes (Signed)
Pt's vitals are WNL, pain is under control and pt is tolerating diet. Discussed discharge instructions with patient. Discharged to home with prescriptions.

## 2015-05-11 DIAGNOSIS — M130 Polyarthritis, unspecified: Secondary | ICD-10-CM | POA: Diagnosis not present

## 2015-05-11 DIAGNOSIS — I6300 Cerebral infarction due to thrombosis of unspecified precerebral artery: Secondary | ICD-10-CM | POA: Diagnosis not present

## 2015-05-11 DIAGNOSIS — I10 Essential (primary) hypertension: Secondary | ICD-10-CM | POA: Diagnosis not present

## 2015-05-11 DIAGNOSIS — E6609 Other obesity due to excess calories: Secondary | ICD-10-CM | POA: Diagnosis not present

## 2015-06-30 ENCOUNTER — Emergency Department (HOSPITAL_COMMUNITY)
Admission: EM | Admit: 2015-06-30 | Discharge: 2015-06-30 | Disposition: A | Discharge status: Home / Self Care | Payer: Commercial Managed Care - HMO | Attending: Emergency Medicine | Admitting: Emergency Medicine

## 2015-06-30 ENCOUNTER — Encounter (HOSPITAL_COMMUNITY): Payer: Self-pay

## 2015-06-30 DIAGNOSIS — Z8669 Personal history of other diseases of the nervous system and sense organs: Secondary | ICD-10-CM | POA: Insufficient documentation

## 2015-06-30 DIAGNOSIS — S199XXA Unspecified injury of neck, initial encounter: Secondary | ICD-10-CM | POA: Insufficient documentation

## 2015-06-30 DIAGNOSIS — X58XXXA Exposure to other specified factors, initial encounter: Secondary | ICD-10-CM | POA: Diagnosis not present

## 2015-06-30 DIAGNOSIS — M542 Cervicalgia: Secondary | ICD-10-CM | POA: Diagnosis not present

## 2015-06-30 DIAGNOSIS — I10 Essential (primary) hypertension: Secondary | ICD-10-CM | POA: Insufficient documentation

## 2015-06-30 DIAGNOSIS — Y998 Other external cause status: Secondary | ICD-10-CM | POA: Diagnosis not present

## 2015-06-30 DIAGNOSIS — Z7982 Long term (current) use of aspirin: Secondary | ICD-10-CM | POA: Insufficient documentation

## 2015-06-30 DIAGNOSIS — M1990 Unspecified osteoarthritis, unspecified site: Secondary | ICD-10-CM | POA: Insufficient documentation

## 2015-06-30 DIAGNOSIS — Z8673 Personal history of transient ischemic attack (TIA), and cerebral infarction without residual deficits: Secondary | ICD-10-CM | POA: Insufficient documentation

## 2015-06-30 DIAGNOSIS — Z87891 Personal history of nicotine dependence: Secondary | ICD-10-CM | POA: Insufficient documentation

## 2015-06-30 DIAGNOSIS — Z8719 Personal history of other diseases of the digestive system: Secondary | ICD-10-CM | POA: Diagnosis not present

## 2015-06-30 DIAGNOSIS — Y9289 Other specified places as the place of occurrence of the external cause: Secondary | ICD-10-CM | POA: Insufficient documentation

## 2015-06-30 DIAGNOSIS — E785 Hyperlipidemia, unspecified: Secondary | ICD-10-CM | POA: Insufficient documentation

## 2015-06-30 DIAGNOSIS — Y9389 Activity, other specified: Secondary | ICD-10-CM | POA: Diagnosis not present

## 2015-06-30 DIAGNOSIS — Z79899 Other long term (current) drug therapy: Secondary | ICD-10-CM | POA: Insufficient documentation

## 2015-06-30 MED ORDER — KETOROLAC TROMETHAMINE 60 MG/2ML IM SOLN
30.0000 mg | Freq: Once | INTRAMUSCULAR | Status: AC
  Administered 2015-06-30: 30 mg via INTRAMUSCULAR
  Filled 2015-06-30: qty 2

## 2015-06-30 MED ORDER — METHOCARBAMOL 500 MG PO TABS
500.0000 mg | ORAL_TABLET | Freq: Once | ORAL | Status: AC
  Administered 2015-06-30: 500 mg via ORAL
  Filled 2015-06-30: qty 1

## 2015-06-30 MED ORDER — METHOCARBAMOL 500 MG PO TABS: 500.0000 mg | ORAL_TABLET | Freq: Two times a day (BID) | ORAL | Status: DC

## 2015-06-30 MED ORDER — NAPROXEN 500 MG PO TABS: 500.0000 mg | ORAL_TABLET | Freq: Two times a day (BID) | ORAL | Status: DC

## 2015-06-30 NOTE — ED Notes (Signed)
sleeping

## 2015-06-30 NOTE — ED Notes (Signed)
Patient here with left sided neck pain when she awoke this am. Pain with any neck movement. Denies trauma. No other associated symptoms

## 2015-06-30 NOTE — ED Provider Notes (Signed)
CSN: [id number]     Arrival date & time 06/30/15  [id number] History   First MD Initiated Contact with Patient 06/30/15 [phone]     Chief Complaint  Patient presents with  . Neck Pain     (Consider location/radiation/quality/duration/timing/severity/associated sxs/prior Treatment) Patient is a 65 y.o. female presenting with neck pain. The history is provided by the patient and medical records.  Neck Pain   65 year old female with history of hypertension, arthritis, hyperlipidemia, prior stroke with residual left-sided weakness, intermittent chronic cough, presenting to the ED for left-sided neck pain. Patient reports this was present upon waking this morning. She states she feels as though she has a "crick in her neck". She states her neck feels very "tense" on the left side. Pain is better with turning her head to the right, worsened when turning to the left. She denies any new trauma or falls. She has no prior history of neck injuries or surgeries in the past. She denies any numbness or weakness of her arms or legs from her baseline.  She denies any headache, dizziness, confusion, changes in speech, or visual disturbance. She denies any fever or chills. No chest pain or shortness of breath.  No sore throat or other URI symptoms.  VSS.  Past Medical History  Diagnosis Date  . Hypertension   . Arthritis   . Hyperlipidemia   . Preoperative clearance   . Stroke ([place]) 2015    WEAKNESS L SIDE OF BODY   . Difficulty sleeping   . Gallstones   . Spasmodic cough     "IT COMES IN DIFFERENT SEASONS"   Past Surgical History  Procedure Laterality Date  . Knee arthroscopy  2010  . Cholecystectomy N/A 04/12/2015    Procedure: LAPAROSCOPIC CHOLECYSTECTOMY WITH INTRAOPERATIVE CHOLANGIOGRAM;  Surgeon: Greer Pickerel, MD;  Location: [facility];  Service: General;  Laterality: N/A;   No family history on file. Social History  Substance Use Topics  . Smoking status: Former Smoker    Quit date: 04/08/1994  .  Smokeless tobacco: Never Used  . Alcohol Use: No   OB History    No data available     Review of Systems  Musculoskeletal: Positive for neck pain.  All other systems reviewed and are negative.     Allergies  Review of patient's allergies indicates no known allergies.  Home Medications   Prior to Admission medications   Medication Sig Start Date End Date Taking? Authorizing Provider  ANORO ELLIPTA 62.5-25 MCG/INH AEPB Inhale 1 puff into the lungs daily. 02/17/15   Historical Provider, MD  aspirin 81 MG EC tablet Take 1 tablet (81 mg total) by mouth daily. 09/18/13   Lavon Paganini Angiulli, PA-C  atorvastatin (LIPITOR) 40 MG tablet Take 1 tablet (40 mg total) by mouth daily at 6 PM. 09/18/13   Lavon Paganini Angiulli, PA-C  losartan (COZAAR) 50 MG tablet Take 50 mg by mouth daily.    Historical Provider, MD  metoprolol tartrate (LOPRESSOR) 25 MG tablet Take 25 mg by mouth daily.    Historical Provider, MD  oxyCODONE (OXY IR/ROXICODONE) 5 MG immediate release tablet Take 1-2 tablets (5-10 mg total) by mouth every 4 (four) hours as needed for moderate pain. 04/13/15   Greer Pickerel, MD   BP 156/81 mmHg  Pulse 85  Temp(Src) 97.3 F (36.3 C) (Oral)  Resp 18  Ht 5\' 3"  (1.6 m)  Wt 101.152 kg  BMI 39.51 kg/m2  SpO2 100%   Physical Exam  Constitutional: She is oriented  to person, place, and time. She appears well-developed and well-nourished.  HENT:  Head: Normocephalic and atraumatic.  Mouth/Throat: Oropharynx is clear and moist.  Airway clear, no swelling or edema  Eyes: Conjunctivae and EOM are normal. Pupils are equal, round, and reactive to light.  Neck: Normal range of motion.  Cardiovascular: Normal rate, regular rhythm and normal heart sounds.   Pulmonary/Chest: Effort normal and breath sounds normal. No respiratory distress.  Abdominal: Soft. Bowel sounds are normal.  Musculoskeletal: Normal range of motion.  Tenderness of left cervical paraspinal muscles with spasm present; no midline  step-off or deformity; full ROM maintained, pain and increased muscle tension with turning head to left; slight weakness of left arm when compared with right (baseline); normal radial pulses and cap refill bilaterally  Neurological: She is alert and oriented to person, place, and time.  Skin: Skin is warm and dry.  Psychiatric: She has a normal mood and affect.  Nursing note and vitals reviewed.   ED Course  Procedures (including critical care time) Labs Review Labs Reviewed - No data to display  Imaging Review No results found. I have personally reviewed and evaluated these images and lab results as part of my medical decision-making.   EKG Interpretation None      MDM   Final diagnoses:  Neck pain   65 year old female here with left-sided neck pain upon waking this morning. Denies any trauma or falls. Patient is afebrile and nontoxic in appearance on exam. She has tenderness and muscle spasm of her left cervical paraspinal muscles. There is no midline step-off or deformity. She maintains full range of motion but does have increased muscle tension and pain when turning her head to the left. She has baseline weakness of her left arm when compared with right but this is unchanged today. Both arms are neurovascularly intact. Suspect this is likely a muscle spasm. Lower suspicion for vertebral fracture or subluxation without any trauma.  Will give trial of medications here and monitor for improvement.  After treatment with Toradol and Robaxin here in the emergency department patient reports improvement of her symptoms. She still has some mild pain when turning her head to the left, but states this is far less severe. She remains neurologically intact to her baseline. At this time, continue to have low suspicion for acute fracture or subluxation without traumatic injury. No changes in her baseline left sided weakness, do not suspect central cord syndrome.  Continue to feel this is likely  muscle spasms.  Will discharge home with Robaxin. Encouraged to follow-up with her PCP if not improving in the next 24-48 hours.  Discussed plan with patient, he/she acknowledged understanding and agreed with plan of care.  Return precautions given for new or worsening symptoms.  Larene Pickett, PA-C 06/30/15 [id number]  Charlesetta Shanks, MD 07/01/15 [phone]

## 2015-06-30 NOTE — Discharge Instructions (Signed)
Take the prescribed medication as directed.  May wish to apply heat to left side of your neck to help relax muscles (heating pad, warm shower/bath, etc.) Follow-up with your doctor if not improving over the next 24-48 hours. Return to the ED for new or worsening symptoms.

## 2015-06-30 NOTE — ED Notes (Signed)
PA at bedside.

## 2015-08-03 ENCOUNTER — Ambulatory Visit: Payer: Commercial Managed Care - HMO | Attending: Family Medicine | Admitting: Physical Therapy

## 2015-08-29 ENCOUNTER — Encounter (HOSPITAL_COMMUNITY): Payer: Self-pay

## 2015-08-29 ENCOUNTER — Emergency Department (HOSPITAL_COMMUNITY): Payer: Commercial Managed Care - HMO

## 2015-08-29 ENCOUNTER — Emergency Department (HOSPITAL_COMMUNITY)
Admission: EM | Admit: 2015-08-29 | Discharge: 2015-08-29 | Disposition: A | Discharge status: Home / Self Care | Payer: Commercial Managed Care - HMO | Attending: Emergency Medicine | Admitting: Emergency Medicine

## 2015-08-29 DIAGNOSIS — D181 Lymphangioma, any site: Secondary | ICD-10-CM

## 2015-08-29 DIAGNOSIS — Y999 Unspecified external cause status: Secondary | ICD-10-CM | POA: Insufficient documentation

## 2015-08-29 DIAGNOSIS — S0101XA Laceration without foreign body of scalp, initial encounter: Secondary | ICD-10-CM | POA: Diagnosis not present

## 2015-08-29 DIAGNOSIS — Y939 Activity, unspecified: Secondary | ICD-10-CM | POA: Insufficient documentation

## 2015-08-29 DIAGNOSIS — J45909 Unspecified asthma, uncomplicated: Secondary | ICD-10-CM | POA: Insufficient documentation

## 2015-08-29 DIAGNOSIS — S0181XA Laceration without foreign body of other part of head, initial encounter: Secondary | ICD-10-CM | POA: Diagnosis not present

## 2015-08-29 DIAGNOSIS — I110 Hypertensive heart disease with heart failure: Secondary | ICD-10-CM | POA: Diagnosis not present

## 2015-08-29 DIAGNOSIS — I5032 Chronic diastolic (congestive) heart failure: Secondary | ICD-10-CM | POA: Diagnosis not present

## 2015-08-29 DIAGNOSIS — W228XXA Striking against or struck by other objects, initial encounter: Secondary | ICD-10-CM | POA: Diagnosis not present

## 2015-08-29 DIAGNOSIS — W19XXXA Unspecified fall, initial encounter: Secondary | ICD-10-CM

## 2015-08-29 DIAGNOSIS — S0990XA Unspecified injury of head, initial encounter: Secondary | ICD-10-CM

## 2015-08-29 DIAGNOSIS — Z87891 Personal history of nicotine dependence: Secondary | ICD-10-CM | POA: Insufficient documentation

## 2015-08-29 DIAGNOSIS — Z8673 Personal history of transient ischemic attack (TIA), and cerebral infarction without residual deficits: Secondary | ICD-10-CM | POA: Diagnosis not present

## 2015-08-29 DIAGNOSIS — Y9222 Religious institution as the place of occurrence of the external cause: Secondary | ICD-10-CM | POA: Insufficient documentation

## 2015-08-29 MED ORDER — ACETAMINOPHEN 500 MG PO TABS
1000.0000 mg | ORAL_TABLET | Freq: Once | ORAL | Status: AC
  Administered 2015-08-29: 1000 mg via ORAL

## 2015-08-29 MED ORDER — ACETAMINOPHEN 500 MG PO TABS
ORAL_TABLET | ORAL | Status: AC
  Filled 2015-08-29: qty 2

## 2015-08-29 MED ORDER — IBUPROFEN 400 MG PO TABS: 400.0000 mg | ORAL_TABLET | Freq: Once | ORAL | Status: DC | PRN

## 2015-08-29 NOTE — ED Notes (Signed)
Pt came to nurses station and reports increase head pain since injury. Pt has normal neuro exam at baseline. Pt given pain medication and increased in acuity.

## 2015-08-29 NOTE — ED Triage Notes (Addendum)
Per EMS, [place] is coming from church with a mechanical fall that caused her to hit her left side of her head on a pew. Laceration noted to the left side of the head with bleeding controlled. Pt denies LOC. Alert & Oriented x4 upon arrival. Hx of Stroke with left-sided deficit

## 2015-08-29 NOTE — ED Notes (Signed)
MD at bedside. 

## 2015-08-29 NOTE — ED Notes (Signed)
Patient transported to CT 

## 2015-08-29 NOTE — ED Provider Notes (Signed)
[facility] DEPT Provider Note   CSN: [id number] Arrival date & time: 08/29/15  1418  First Provider Contact:  None       History   Chief Complaint Chief Complaint  Patient presents with  . Fall    HPI [name] is a 65 y.o. female.  65 year old female who was praying for someone at church when they were over come by the [facility] and lashed out, knocking the patient to the floor. She hit her head on the wood pew and began bleeding from the scalp. She denies any loss of consciousness but did have a brief headache. Headache is currently resolved. She denies any vision changes, new weakness, numbness. She does have history of previous stroke with residual left-sided weakness in the upper and lower extremity. She normally ambulates with a cane and has been able to ambulate as usual.   The history is provided by the patient and a relative.  Fall  This is a new problem. The current episode started today. The problem occurs rarely. The problem has been unchanged. Associated symptoms include headaches. Pertinent negatives include no abdominal pain, anorexia, arthralgias, chest pain, congestion, coughing, diaphoresis, fatigue, fever, nausea, neck pain, numbness, sore throat, urinary symptoms, vertigo, visual change, vomiting or weakness. Nothing aggravates the symptoms. She has tried nothing for the symptoms. The treatment provided no relief.    Past Medical History:  Diagnosis Date  . Arthritis   . Difficulty sleeping   . Gallstones   . Hyperlipidemia   . Hypertension   . Preoperative clearance   . Spasmodic cough    "IT COMES IN DIFFERENT SEASONS"  . Stroke ([place]) 2015   WEAKNESS L SIDE OF BODY     Patient Active Problem List   Diagnosis Date Noted  . Chronic cholecystitis 04/12/2015  . Preoperative clearance 03/17/2015  . Spastic hemiplegia affecting nondominant side ([place]) 10/27/2013  . Adhesive capsulitis of right shoulder 10/27/2013  . Chronic diastolic heart  failure ([place]) 08/26/2013  . Dyslipidemia 08/26/2013  . Borderline diabetes 08/26/2013  . CVA (cerebral infarction) 08/26/2013  . Acute CVA (cerebrovascular accident) ([place]) 08/23/2013  . Pre-syncope 08/17/2012  . Hypokalemia 08/17/2012  . ARTHRITIS 03/28/2010  . DE QUERVAIN'S TENOSYNOVITIS 03/28/2010  . Normocytic anemia 07/26/2009  . Asthma 03/09/2009  . DENTAL CARIES 03/09/2009  . CANDIDIASIS OF VULVA AND VAGINA 06/04/2008  . SKIN TAG 06/04/2008  . ARTHRITIS, KNEES, BILATERAL 05/22/2008  . HYPERTENSION, BENIGN ESSENTIAL 05/04/2008  . KNEE PAIN, BILATERAL 05/04/2008  . COUGH 05/04/2008  . MENISCUS TEAR, RIGHT 12/07/2005    Past Surgical History:  Procedure Laterality Date  . CHOLECYSTECTOMY N/A 04/12/2015   Procedure: LAPAROSCOPIC CHOLECYSTECTOMY WITH INTRAOPERATIVE CHOLANGIOGRAM;  Surgeon: Greer Pickerel, MD;  Location: [facility];  Service: General;  Laterality: N/A;  . KNEE ARTHROSCOPY  2010    OB History    No data available       Home Medications    Prior to Admission medications   Medication Sig Start Date End Date Taking? Authorizing Provider  losartan-hydrochlorothiazide (HYZAAR) 50-12.5 MG tablet Take 1 tablet by mouth daily. 05/24/15  Yes Historical Provider, MD  metoprolol tartrate (LOPRESSOR) 25 MG tablet Take 25 mg by mouth daily.   Yes Historical Provider, MD  atorvastatin (LIPITOR) 40 MG tablet Take 1 tablet (40 mg total) by mouth daily at 6 PM. 09/18/13   Lavon Paganini Angiulli, PA-C  methocarbamol (ROBAXIN) 500 MG tablet Take 1 tablet (500 mg total) by mouth 2 (two) times daily. Patient not  taking: Reported on 08/29/2015 06/30/15   Larene Pickett, PA-C  naproxen (NAPROSYN) 500 MG tablet Take 1 tablet (500 mg total) by mouth 2 (two) times daily with a meal. Patient not taking: Reported on 08/29/2015 06/30/15   Larene Pickett, PA-C    Family History No family history on file.  Social History Social History  Substance Use Topics  . Smoking status: Former Smoker    Quit  date: 04/08/1994  . Smokeless tobacco: Never Used  . Alcohol use No     Allergies   Review of patient's allergies indicates no known allergies.   Review of Systems Review of Systems  Constitutional: Negative for diaphoresis, fatigue and fever.  HENT: Negative for congestion and sore throat.   Eyes: Negative for photophobia and visual disturbance.  Respiratory: Negative for cough.   Cardiovascular: Negative for chest pain.  Gastrointestinal: Negative for abdominal pain, anorexia, nausea and vomiting.  Musculoskeletal: Negative for arthralgias and neck pain.  Skin: Positive for wound.  Neurological: Positive for headaches. Negative for vertigo, weakness and numbness.  Psychiatric/Behavioral: Negative for agitation and confusion.     Physical Exam Updated Vital Signs BP 134/96   Pulse 92   Temp 98.2 F (36.8 C)   Resp 20   SpO2 98%   Physical Exam  Constitutional: She appears well-developed and well-nourished. No distress.  HENT:  Head: Normocephalic.  3 small wounds in the left scalp. Hemostatic. Largest wound is less than 1 cm and does not separate with traction.  Eyes: Conjunctivae are normal.  Neck: Neck supple.  Cardiovascular: Normal rate and regular rhythm.   No murmur heard. Pulmonary/Chest: Effort normal and breath sounds normal. No respiratory distress.  Abdominal: Soft. There is no tenderness.  Musculoskeletal: She exhibits no edema.  Neurological: She is alert.  Left grip is weaker than right which patient states is baseline.  Skin: Skin is warm and dry.  Psychiatric: She has a normal mood and affect.  Nursing note and vitals reviewed.    ED Treatments / Results  Labs (all labs ordered are listed, but only abnormal results are displayed) Labs Reviewed - No data to display  EKG  EKG Interpretation None       Radiology Ct Head Wo Contrast  Result Date: 08/29/2015 CLINICAL DATA:  Post fall hitting head now with laceration involving the left  side of the head. No loss of consciousness. History of left-sided weakness due to prior stroke. EXAM: CT HEAD WITHOUT CONTRAST TECHNIQUE: Contiguous axial images were obtained from the base of the skull through the vertex without intravenous contrast. COMPARISON:  11/07/2013; 08/24/2013; 08/23/2013; brain MRI - 08/23/2013 FINDINGS: Brain: There is asymmetric soft tissue swelling about the left parietal calvarium (representative images 17 through 26, series 201). This finding is without associated radiopaque foreign body or displaced calvarial fracture. Compared to the 11/2013 examination, there has been development of prominence of the extra-axial space about the right frontal and parietal lobes likely indicative of a subdural hygroma. While the majority of this extra-axial fluid is hypo attenuating, there may be a trace amount of dependent acute subdural hemorrhage lying dependently about the right parietal lobe (image 15, series 21). While the majority of the subdural hygroma appears chronic, there is minimal mass effect with approximately 2 mm of right to left midline shift. No definite subarachnoid or intraventricular hemorrhage. Previously characterized arachnoid cyst within the anterior aspect of the right middle cranial fossa (image 6, series 21), is unchanged since remote brain MRI performed 08/2013.  Scattered periventricular hypodensities compatible microvascular ischemic disease. No CT evidence of acute large territory infarct. Vascular: Intracranial atherosclerosis Skull: No displaced calvarial fracture. Sinuses/Orbits: Limited visualization the paranasal sinuses and mastoid air cells is normal. No air-fluid levels. Other: None. IMPRESSION: 1. Soft tissue swelling about the left parietal calvarium without associated radiopaque foreign body or displaced calvarial fracture. 2. Suspected trace amount of acute subdural blood within a largely chronic crescentic right-sided subdural hygroma which results in  minimal (approximately 2 mm) of right to left midline shift. 3. Similar findings of microvascular ischemic disease. Critical Value/emergent results were called by telephone at the time of interpretation on 08/29/2015 at 8:03 pm to Dr. Allie Bossier , who verbally acknowledged these results. Electronically Signed   By: Sandi Mariscal M.D.   On: 08/29/2015 20:05   Procedures Procedures (including critical care time)  Medications Ordered in ED Medications  acetaminophen (TYLENOL) tablet 1,000 mg (1,000 mg Oral Given 08/29/15 1707)     Initial Impression / Assessment and Plan / ED Course  I have reviewed the triage vital signs and the nursing notes.  Pertinent labs & imaging results that were available during my care of the patient were reviewed by me and considered in my medical decision making (see chart for details).  Clinical Course   Patient presents for fall at church with small scalp laceration that was hemostatic on arrival. CT of the head demonstrates chronic hygroma with questionable acute blood. She was instructed to quit taking her aspirin until she is able to follow up with neurology in the next week or 2 regarding this chronic hygroma. There is no need for repair of the lacerations as they were less than 1 cm and well approximated without any intervention. Patient had normal neurologic exam and was discharged home in good condition with strict return precautions.  Final Clinical Impressions(s) / ED Diagnoses   Final diagnoses:  Fall, initial encounter  Scalp laceration, initial encounter  Minor head injury, initial encounter  Hygroma    New Prescriptions Discharge Medication List as of 08/29/2015  9:36 PM       Allie Bossier, MD 08/30/15 0301    Elnora Morrison, MD 09/07/15 [id number]

## 2015-08-29 NOTE — ED Notes (Signed)
Resident at bedside.  

## 2015-09-20 ENCOUNTER — Ambulatory Visit: Payer: Self-pay | Admitting: Neurology

## 2015-09-20 DIAGNOSIS — D181 Lymphangioma, any site: Secondary | ICD-10-CM | POA: Diagnosis not present

## 2015-09-20 DIAGNOSIS — H5500 Unspecified nystagmus: Secondary | ICD-10-CM | POA: Diagnosis not present

## 2015-09-21 ENCOUNTER — Encounter: Payer: Self-pay | Admitting: Neurology

## 2015-09-23 ENCOUNTER — Telehealth: Payer: Self-pay | Admitting: Neurology

## 2015-09-23 NOTE — Telephone Encounter (Signed)
I reviewed Ct head images showing chronic right convexity subdural with mild mass effect which is likely causing her falls. I spoke to [facility] at Dr. Fransico Setters clinic were not available to take my call. I recommended patient be referred to see a neurosurgeon urgently. She can be treated for fluid in the ears medically by Dr. Criss Rosales as necessary.

## 2015-09-23 NOTE — Telephone Encounter (Signed)
Rn call patient to let her know the appt for Dr. Leonie Man will be cancel for 10/05/2015. Rn stated Dr. Criss Rosales her PCP referred her to [place] [facility]. Rn stated her PCP is taking care of the PA and appt time. Rn stated the appt for Dr.Sethi will be cancel. Pt verbalized understanding.

## 2015-09-23 NOTE — Telephone Encounter (Signed)
Spoke with Judeen Hammans at Dr. Criss Rosales office. Rn stated pt has appt on 10/05/2015 with Dr.Sethi and it should be cancel. Judeen Hammans stated Dr. Criss Rosales already sent the urgent referral to [facility] and got the PA. Rn stated the appt will be cancel.

## 2015-09-23 NOTE — Telephone Encounter (Signed)
Dr. Criss Rosales office called and would like a call back to discuss medication to treat pt. Please call [phone]

## 2015-09-23 NOTE — Telephone Encounter (Signed)
Rn spoke with Judeen Hammans at Dr. Criss Rosales office. Dr.Bland would like to know if patient should be treated for a fluid behind the ear. Dr. Criss Rosales would like to know if she should treat the ear because this be causing  Fall. Pt is off balance and they would like to know should it be treated. Pt had MRI and they had concerns. Pt is schedule for 10/05/2015 for new pt with Dr.Sethi.

## 2015-09-29 DIAGNOSIS — Z6839 Body mass index (BMI) 39.0-39.9, adult: Secondary | ICD-10-CM | POA: Diagnosis not present

## 2015-09-29 DIAGNOSIS — D181 Lymphangioma, any site: Secondary | ICD-10-CM | POA: Diagnosis not present

## 2015-10-04 DIAGNOSIS — H524 Presbyopia: Secondary | ICD-10-CM | POA: Diagnosis not present

## 2015-10-04 DIAGNOSIS — H5210 Myopia, unspecified eye: Secondary | ICD-10-CM | POA: Diagnosis not present

## 2015-10-05 ENCOUNTER — Ambulatory Visit: Payer: Commercial Managed Care - HMO | Admitting: Neurology

## 2015-10-12 DIAGNOSIS — Z0100 Encounter for examination of eyes and vision without abnormal findings: Secondary | ICD-10-CM | POA: Diagnosis not present

## 2016-02-27 ENCOUNTER — Encounter (HOSPITAL_COMMUNITY): Payer: Self-pay | Admitting: Emergency Medicine

## 2016-02-27 ENCOUNTER — Emergency Department (HOSPITAL_COMMUNITY)
Admission: EM | Admit: 2016-02-27 | Discharge: 2016-02-27 | Disposition: A | Discharge status: Home / Self Care | Payer: Medicare HMO | Attending: Emergency Medicine | Admitting: Emergency Medicine

## 2016-02-27 ENCOUNTER — Emergency Department (HOSPITAL_COMMUNITY): Payer: Medicare HMO

## 2016-02-27 DIAGNOSIS — I110 Hypertensive heart disease with heart failure: Secondary | ICD-10-CM | POA: Insufficient documentation

## 2016-02-27 DIAGNOSIS — I5032 Chronic diastolic (congestive) heart failure: Secondary | ICD-10-CM | POA: Diagnosis not present

## 2016-02-27 DIAGNOSIS — Z5181 Encounter for therapeutic drug level monitoring: Secondary | ICD-10-CM | POA: Insufficient documentation

## 2016-02-27 DIAGNOSIS — Z79899 Other long term (current) drug therapy: Secondary | ICD-10-CM | POA: Insufficient documentation

## 2016-02-27 DIAGNOSIS — R519 Headache, unspecified: Secondary | ICD-10-CM

## 2016-02-27 DIAGNOSIS — Z87891 Personal history of nicotine dependence: Secondary | ICD-10-CM | POA: Diagnosis not present

## 2016-02-27 DIAGNOSIS — R51 Headache: Secondary | ICD-10-CM | POA: Insufficient documentation

## 2016-02-27 DIAGNOSIS — I10 Essential (primary) hypertension: Secondary | ICD-10-CM | POA: Diagnosis not present

## 2016-02-27 DIAGNOSIS — Z8673 Personal history of transient ischemic attack (TIA), and cerebral infarction without residual deficits: Secondary | ICD-10-CM | POA: Diagnosis not present

## 2016-02-27 LAB — CBC WITH DIFFERENTIAL/PLATELET
BASOS PCT: 0 %
Basophils Absolute: 0 10*3/uL (ref 0.0–0.1)
EOS ABS: 0.3 10*3/uL (ref 0.0–0.7)
Eosinophils Relative: 8 %
HCT: 35.3 % — ABNORMAL LOW (ref 36.0–46.0)
HEMOGLOBIN: 11.6 g/dL — AB (ref 12.0–15.0)
Lymphocytes Relative: 54 %
Lymphs Abs: 2 10*3/uL (ref 0.7–4.0)
MCH: 28.5 pg (ref 26.0–34.0)
MCHC: 32.9 g/dL (ref 30.0–36.0)
MCV: 86.7 fL (ref 78.0–100.0)
MONOS PCT: 10 %
Monocytes Absolute: 0.4 10*3/uL (ref 0.1–1.0)
NEUTROS PCT: 28 %
Neutro Abs: 1 10*3/uL — ABNORMAL LOW (ref 1.7–7.7)
Platelets: 197 10*3/uL (ref 150–400)
RBC: 4.07 MIL/uL (ref 3.87–5.11)
RDW: 14.4 % (ref 11.5–15.5)
WBC: 3.7 10*3/uL — ABNORMAL LOW (ref 4.0–10.5)

## 2016-02-27 LAB — COMPREHENSIVE METABOLIC PANEL
ALT: 24 U/L (ref 14–54)
ANION GAP: 9 (ref 5–15)
AST: 17 U/L (ref 15–41)
Albumin: 3.6 g/dL (ref 3.5–5.0)
Alkaline Phosphatase: 65 U/L (ref 38–126)
BILIRUBIN TOTAL: 0.6 mg/dL (ref 0.3–1.2)
BUN: 8 mg/dL (ref 6–20)
CO2: 25 mmol/L (ref 22–32)
Calcium: 9.2 mg/dL (ref 8.9–10.3)
Chloride: 106 mmol/L (ref 101–111)
Creatinine, Ser: 0.68 mg/dL (ref 0.44–1.00)
Glucose, Bld: 100 mg/dL — ABNORMAL HIGH (ref 65–99)
POTASSIUM: 3.3 mmol/L — AB (ref 3.5–5.1)
Sodium: 140 mmol/L (ref 135–145)
TOTAL PROTEIN: 7.2 g/dL (ref 6.5–8.1)

## 2016-02-27 LAB — I-STAT TROPONIN, ED: TROPONIN I, POC: 0 ng/mL (ref 0.00–0.08)

## 2016-02-27 LAB — APTT: aPTT: 29 seconds (ref 24–36)

## 2016-02-27 LAB — PROTIME-INR
INR: 1.13
PROTHROMBIN TIME: 14.6 s (ref 11.4–15.2)

## 2016-02-27 MED ORDER — ACETAMINOPHEN 500 MG PO TABS
1000.0000 mg | ORAL_TABLET | Freq: Once | ORAL | Status: AC
  Administered 2016-02-27: 1000 mg via ORAL
  Filled 2016-02-27: qty 2

## 2016-02-27 MED ORDER — METOCLOPRAMIDE HCL 5 MG PO TABS: 5.0000 mg | ORAL_TABLET | Freq: Three times a day (TID) | ORAL | 0 refills | Status: DC | PRN

## 2016-02-27 NOTE — ED Provider Notes (Signed)
Early DEPT Provider Note   CSN: [id number] Arrival date & time: 02/27/16  1209     History   Chief Complaint Chief Complaint  Patient presents with  . Headache    HPI [name] is a 66 y.o. female.  Patient has a history of chronic head bleed, cyst, stroke with left-sided weakness presents with recurrent headache for the past few months. Gradual onset similar to previous. No fever or infectious symptoms. No new neurologic symptoms or signs. Patient feels well otherwise. Patient denies any chest pain or shortness of breath. Currently located left-sided, no current temple tenderness.      Past Medical History:  Diagnosis Date  . Arthritis   . Difficulty sleeping   . Gallstones   . Hyperlipidemia   . Hypertension   . Preoperative clearance   . Spasmodic cough    "IT COMES IN DIFFERENT SEASONS"  . Stroke ([place]) 2015   WEAKNESS L SIDE OF BODY     Patient Active Problem List   Diagnosis Date Noted  . Chronic cholecystitis 04/12/2015  . Preoperative clearance 03/17/2015  . Spastic hemiplegia affecting nondominant side ([place]) 10/27/2013  . Adhesive capsulitis of right shoulder 10/27/2013  . Chronic diastolic heart failure ([place]) 08/26/2013  . Dyslipidemia 08/26/2013  . Borderline diabetes 08/26/2013  . CVA (cerebral infarction) 08/26/2013  . Acute CVA (cerebrovascular accident) ([place]) 08/23/2013  . Pre-syncope 08/17/2012  . Hypokalemia 08/17/2012  . ARTHRITIS 03/28/2010  . DE QUERVAIN'S TENOSYNOVITIS 03/28/2010  . Normocytic anemia 07/26/2009  . Asthma 03/09/2009  . DENTAL CARIES 03/09/2009  . CANDIDIASIS OF VULVA AND VAGINA 06/04/2008  . SKIN TAG 06/04/2008  . ARTHRITIS, KNEES, BILATERAL 05/22/2008  . HYPERTENSION, BENIGN ESSENTIAL 05/04/2008  . KNEE PAIN, BILATERAL 05/04/2008  . COUGH 05/04/2008  . MENISCUS TEAR, RIGHT 12/07/2005    Past Surgical History:  Procedure Laterality Date  . CHOLECYSTECTOMY N/A 04/12/2015   Procedure: LAPAROSCOPIC  CHOLECYSTECTOMY WITH INTRAOPERATIVE CHOLANGIOGRAM;  Surgeon: Greer Pickerel, MD;  Location: [facility];  Service: General;  Laterality: N/A;  . KNEE ARTHROSCOPY  2010    OB History    No data available       Home Medications    Prior to Admission medications   Medication Sig Start Date End Date Taking? Authorizing Provider  atorvastatin (LIPITOR) 40 MG tablet Take 1 tablet (40 mg total) by mouth daily at 6 PM. 09/18/13   Lavon Paganini Angiulli, PA-C  losartan-hydrochlorothiazide (HYZAAR) 50-12.5 MG tablet Take 1 tablet by mouth daily. 05/24/15   Historical Provider, MD  methocarbamol (ROBAXIN) 500 MG tablet Take 1 tablet (500 mg total) by mouth 2 (two) times daily. Patient not taking: Reported on 08/29/2015 06/30/15   Larene Pickett, PA-C  metoCLOPramide (REGLAN) 5 MG tablet Take 1 tablet (5 mg total) by mouth every 8 (eight) hours as needed for nausea. 02/27/16   Elnora Morrison, MD  metoprolol tartrate (LOPRESSOR) 25 MG tablet Take 25 mg by mouth daily.    Historical Provider, MD  naproxen (NAPROSYN) 500 MG tablet Take 1 tablet (500 mg total) by mouth 2 (two) times daily with a meal. Patient not taking: Reported on 08/29/2015 06/30/15   Larene Pickett, PA-C    Family History History reviewed. No pertinent family history.  Social History Social History  Substance Use Topics  . Smoking status: Former Smoker    Quit date: 04/08/1994  . Smokeless tobacco: Never Used  . Alcohol use No     Allergies   Patient has no known  allergies.   Review of Systems Review of Systems  Constitutional: Negative for chills and fever.  HENT: Negative for congestion.   Eyes: Negative for visual disturbance.  Respiratory: Negative for shortness of breath.   Cardiovascular: Negative for chest pain.  Gastrointestinal: Negative for abdominal pain and vomiting.  Genitourinary: Negative for dysuria and flank pain.  Musculoskeletal: Negative for back pain, neck pain and neck stiffness.  Skin: Negative for rash.    Neurological: Positive for headaches. Negative for seizures and light-headedness.     Physical Exam Updated Vital Signs BP 106/96 (BP Location: Right Arm)   Pulse 72   Temp 98.3 F (36.8 C) (Oral)   Resp 18   Ht 5\' 3"  (1.6 m)   Wt 233 lb (105.7 kg)   SpO2 98%   BMI 41.27 kg/m   Physical Exam  Constitutional: She appears well-developed and well-nourished. No distress.  HENT:  Head: Normocephalic and atraumatic.  Eyes: Conjunctivae are normal.  Neck: Neck supple.  Cardiovascular: Normal rate and regular rhythm.   Pulmonary/Chest: Effort normal and breath sounds normal. No respiratory distress.  Abdominal: Soft. There is no tenderness.  Musculoskeletal: She exhibits no edema.  Neurological: She is alert. GCS eye subscore is 4. GCS verbal subscore is 5. GCS motor subscore is 6.  5+ strength in right side, 3+left UE and LE Sensation to palpation intact in UE and LE. CNs 2-12 grossly intact.  EOMFI.  PERRL.   Finger nose intact right No nystagmus   Skin: Skin is warm and dry.  Psychiatric: She has a normal mood and affect.  Nursing note and vitals reviewed.    ED Treatments / Results  Labs (all labs ordered are listed, but only abnormal results are displayed) Labs Reviewed  COMPREHENSIVE METABOLIC PANEL - Abnormal; Notable for the following:       Result Value   Potassium 3.3 (*)    Glucose, Bld 100 (*)    All other components within normal limits  CBC WITH DIFFERENTIAL/PLATELET - Abnormal; Notable for the following:    WBC 3.7 (*)    Hemoglobin 11.6 (*)    HCT 35.3 (*)    Neutro Abs 1.0 (*)    All other components within normal limits  PROTIME-INR  APTT  DIFFERENTIAL  I-STAT TROPOININ, ED    EKG  EKG Interpretation None       Radiology Ct Head Wo Contrast  Result Date: 02/27/2016 CLINICAL DATA:  Headache over the last several weeks. EXAM: CT HEAD WITHOUT CONTRAST TECHNIQUE: Contiguous axial images were obtained from the base of the skull through the  vertex without intravenous contrast. COMPARISON:  08/29/2015 FINDINGS: Brain: No evidence of acute infarction. There is old infarction in the right basal ganglia and the left basal ganglia. No large vessel territory infarction. No mass lesion. There is a chronic arachnoid cyst at the anterior middle cranial fossa on the right. There is a chronic low-density subdural hygroma along the right convexity with maximal thickness of 1 cm. No sign of recent bleeding. No subdural material on the left. 2 mm of right-to-left midline shift due to the chronic hygroma. Vascular: There is atherosclerotic calcification of the major vessels at the base of the brain. Skull: Negative Sinuses/Orbits: Clear/normal Other: None significant IMPRESSION: No acute finding. Chronic arachnoid cyst of the anterior middle cranial fossa on the right. Chronic low-density subdural hygroma along the right convexity, maximal thickness 1 cm. Mild mass effect with 2 mm of right-to-left shift. No change since 08/29/2015. Electronically Signed  By: Nelson Chimes M.D.   On: 02/27/2016 14:03    Procedures Procedures (including critical care time)  Medications Ordered in ED Medications  acetaminophen (TYLENOL) tablet 1,000 mg (1,000 mg Oral Given 02/27/16 1627)     Initial Impression / Assessment and Plan / ED Course  I have reviewed the triage vital signs and the nursing notes.  Pertinent labs & imaging results that were available during my care of the patient were reviewed by me and considered in my medical decision making (see chart for details).   patient presents with recurrent headache similar to previous. CT scan performed showing similar chronic bleed and mild shift is previous. Discussed continued follow-up with primary Dr. and neurology. Discussed reasons to return. Patient screening blood work unremarkable. Patient is driving and prefers prescription. Plan for migraine medicine and neurology follow-up.  Results and differential  diagnosis were discussed with the patient/parent/guardian. Xrays were independently reviewed by myself.  Close follow up outpatient was discussed, comfortable with the plan.   Medications  acetaminophen (TYLENOL) tablet 1,000 mg (1,000 mg Oral Given 02/27/16 1627)    Vitals:   02/27/16 1615 02/27/16 1630 02/27/16 1645 02/27/16 1713  BP: 145/79 149/91 124/64 106/96  Pulse: 77 71 68 72  Resp:   12 18  Temp:      TempSrc:      SpO2: 99% 99% 99% 98%  Weight:      Height:        Final diagnoses:  Headache, unspecified headache type     Final Clinical Impressions(s) / ED Diagnoses   Final diagnoses:  Headache, unspecified headache type    New Prescriptions New Prescriptions   METOCLOPRAMIDE (REGLAN) 5 MG TABLET    Take 1 tablet (5 mg total) by mouth every 8 (eight) hours as needed for nausea.     Elnora Morrison, MD 02/27/16 1726

## 2016-02-27 NOTE — ED Notes (Signed)
Per Dr Reather Converse pt has history of slow chronic head bleed which causes headache, had previous stroke and no new changes to CT so instructed to discontinue orders and discharge pt when paperwork is completed

## 2016-02-27 NOTE — Discharge Instructions (Signed)
If you were given medicines take as directed.  If you are on coumadin or contraceptives realize their levels and effectiveness is altered by many different medicines.  If you have any reaction (rash, tongues swelling, other) to the medicines stop taking and see a physician.    Tried taking Reglan and Benadryl fear headaches. Continue Tylenol every 4-6 hours as needed. If your blood pressure was elevated in the ER make sure you follow up for management with a primary doctor or return for chest pain, shortness of breath or stroke symptoms.  Please follow up as directed and return to the ER or see a physician for new or worsening symptoms.  Thank you. Vitals:   02/27/16 1233 02/27/16 1613 02/27/16 1615 02/27/16 1630  BP:  151/87 145/79 149/91  Pulse:  71 77 71  Resp:  16    Temp:      TempSrc:      SpO2:  98% 99% 99%  Weight: 233 lb (105.7 kg)     Height: 5\' 3"  (1.6 m)

## 2016-03-16 DIAGNOSIS — E069 Thyroiditis, unspecified: Secondary | ICD-10-CM | POA: Diagnosis not present

## 2016-03-16 DIAGNOSIS — Z79899 Other long term (current) drug therapy: Secondary | ICD-10-CM | POA: Diagnosis not present

## 2016-03-16 DIAGNOSIS — R635 Abnormal weight gain: Secondary | ICD-10-CM | POA: Diagnosis not present

## 2016-03-16 DIAGNOSIS — M130 Polyarthritis, unspecified: Secondary | ICD-10-CM | POA: Diagnosis not present

## 2016-03-16 DIAGNOSIS — I10 Essential (primary) hypertension: Secondary | ICD-10-CM | POA: Diagnosis not present

## 2016-03-30 DIAGNOSIS — M130 Polyarthritis, unspecified: Secondary | ICD-10-CM | POA: Diagnosis not present

## 2016-03-30 DIAGNOSIS — J398 Other specified diseases of upper respiratory tract: Secondary | ICD-10-CM | POA: Diagnosis not present

## 2016-03-30 DIAGNOSIS — Z23 Encounter for immunization: Secondary | ICD-10-CM | POA: Diagnosis not present

## 2016-03-30 DIAGNOSIS — Z0000 Encounter for general adult medical examination without abnormal findings: Secondary | ICD-10-CM | POA: Diagnosis not present

## 2016-03-30 DIAGNOSIS — I10 Essential (primary) hypertension: Secondary | ICD-10-CM | POA: Diagnosis not present

## 2016-05-11 DIAGNOSIS — I10 Essential (primary) hypertension: Secondary | ICD-10-CM | POA: Diagnosis not present

## 2016-05-11 DIAGNOSIS — M130 Polyarthritis, unspecified: Secondary | ICD-10-CM | POA: Diagnosis not present

## 2016-05-11 DIAGNOSIS — E6609 Other obesity due to excess calories: Secondary | ICD-10-CM | POA: Diagnosis not present

## 2016-05-11 DIAGNOSIS — G4762 Sleep related leg cramps: Secondary | ICD-10-CM | POA: Diagnosis not present

## 2016-05-25 DIAGNOSIS — J398 Other specified diseases of upper respiratory tract: Secondary | ICD-10-CM | POA: Diagnosis not present

## 2016-05-25 DIAGNOSIS — M130 Polyarthritis, unspecified: Secondary | ICD-10-CM | POA: Diagnosis not present

## 2016-05-25 DIAGNOSIS — I10 Essential (primary) hypertension: Secondary | ICD-10-CM | POA: Diagnosis not present

## 2016-05-25 DIAGNOSIS — E6609 Other obesity due to excess calories: Secondary | ICD-10-CM | POA: Diagnosis not present

## 2016-06-26 DIAGNOSIS — R05 Cough: Secondary | ICD-10-CM | POA: Diagnosis not present

## 2016-06-26 DIAGNOSIS — I10 Essential (primary) hypertension: Secondary | ICD-10-CM | POA: Diagnosis not present

## 2016-06-26 DIAGNOSIS — E6609 Other obesity due to excess calories: Secondary | ICD-10-CM | POA: Diagnosis not present

## 2016-06-26 DIAGNOSIS — M130 Polyarthritis, unspecified: Secondary | ICD-10-CM | POA: Diagnosis not present

## 2016-07-05 ENCOUNTER — Other Ambulatory Visit (HOSPITAL_COMMUNITY): Payer: Self-pay | Admitting: Family Medicine

## 2016-07-05 DIAGNOSIS — R1312 Dysphagia, oropharyngeal phase: Secondary | ICD-10-CM

## 2016-07-05 DIAGNOSIS — M130 Polyarthritis, unspecified: Secondary | ICD-10-CM | POA: Diagnosis not present

## 2016-07-05 DIAGNOSIS — R05 Cough: Secondary | ICD-10-CM | POA: Diagnosis not present

## 2016-07-05 DIAGNOSIS — E6609 Other obesity due to excess calories: Secondary | ICD-10-CM | POA: Diagnosis not present

## 2016-07-05 DIAGNOSIS — I10 Essential (primary) hypertension: Secondary | ICD-10-CM | POA: Diagnosis not present

## 2016-07-07 ENCOUNTER — Ambulatory Visit (HOSPITAL_COMMUNITY)
Admission: RE | Admit: 2016-07-07 | Discharge: 2016-07-07 | Disposition: A | Discharge status: Home / Self Care | Payer: Commercial Managed Care - HMO | Source: Ambulatory Visit | Attending: Family Medicine | Admitting: Family Medicine

## 2016-07-07 DIAGNOSIS — R1312 Dysphagia, oropharyngeal phase: Secondary | ICD-10-CM | POA: Diagnosis not present

## 2016-07-07 DIAGNOSIS — R05 Cough: Secondary | ICD-10-CM | POA: Diagnosis not present

## 2016-07-12 DIAGNOSIS — R05 Cough: Secondary | ICD-10-CM | POA: Diagnosis not present

## 2016-07-12 DIAGNOSIS — I10 Essential (primary) hypertension: Secondary | ICD-10-CM | POA: Diagnosis not present

## 2016-07-12 DIAGNOSIS — E6609 Other obesity due to excess calories: Secondary | ICD-10-CM | POA: Diagnosis not present

## 2016-07-12 DIAGNOSIS — M130 Polyarthritis, unspecified: Secondary | ICD-10-CM | POA: Diagnosis not present

## 2016-07-20 DIAGNOSIS — I10 Essential (primary) hypertension: Secondary | ICD-10-CM | POA: Diagnosis not present

## 2016-07-20 DIAGNOSIS — R05 Cough: Secondary | ICD-10-CM | POA: Diagnosis not present

## 2016-07-20 DIAGNOSIS — E6609 Other obesity due to excess calories: Secondary | ICD-10-CM | POA: Diagnosis not present

## 2016-07-20 DIAGNOSIS — M130 Polyarthritis, unspecified: Secondary | ICD-10-CM | POA: Diagnosis not present

## 2016-08-01 DIAGNOSIS — M130 Polyarthritis, unspecified: Secondary | ICD-10-CM | POA: Diagnosis not present

## 2016-08-01 DIAGNOSIS — I10 Essential (primary) hypertension: Secondary | ICD-10-CM | POA: Diagnosis not present

## 2016-08-01 DIAGNOSIS — E6609 Other obesity due to excess calories: Secondary | ICD-10-CM | POA: Diagnosis not present

## 2016-08-01 DIAGNOSIS — R05 Cough: Secondary | ICD-10-CM | POA: Diagnosis not present

## 2016-09-25 ENCOUNTER — Ambulatory Visit: Payer: Medicare HMO | Admitting: Allergy and Immunology

## 2016-09-27 ENCOUNTER — Emergency Department (HOSPITAL_COMMUNITY)
Admission: EM | Admit: 2016-09-27 | Discharge: 2016-09-27 | Disposition: A | Discharge status: Home / Self Care | Payer: Medicare HMO | Attending: Emergency Medicine | Admitting: Emergency Medicine

## 2016-09-27 ENCOUNTER — Emergency Department (HOSPITAL_COMMUNITY): Payer: Medicare HMO

## 2016-09-27 ENCOUNTER — Encounter (HOSPITAL_COMMUNITY): Payer: Self-pay

## 2016-09-27 DIAGNOSIS — Z79899 Other long term (current) drug therapy: Secondary | ICD-10-CM | POA: Insufficient documentation

## 2016-09-27 DIAGNOSIS — Z87891 Personal history of nicotine dependence: Secondary | ICD-10-CM | POA: Diagnosis not present

## 2016-09-27 DIAGNOSIS — J45909 Unspecified asthma, uncomplicated: Secondary | ICD-10-CM | POA: Insufficient documentation

## 2016-09-27 DIAGNOSIS — I5032 Chronic diastolic (congestive) heart failure: Secondary | ICD-10-CM | POA: Diagnosis not present

## 2016-09-27 DIAGNOSIS — G8114 Spastic hemiplegia affecting left nondominant side: Secondary | ICD-10-CM | POA: Insufficient documentation

## 2016-09-27 DIAGNOSIS — R0602 Shortness of breath: Secondary | ICD-10-CM | POA: Diagnosis not present

## 2016-09-27 DIAGNOSIS — I110 Hypertensive heart disease with heart failure: Secondary | ICD-10-CM | POA: Insufficient documentation

## 2016-09-27 DIAGNOSIS — R05 Cough: Secondary | ICD-10-CM | POA: Insufficient documentation

## 2016-09-27 DIAGNOSIS — R55 Syncope and collapse: Secondary | ICD-10-CM | POA: Diagnosis not present

## 2016-09-27 DIAGNOSIS — I509 Heart failure, unspecified: Secondary | ICD-10-CM | POA: Insufficient documentation

## 2016-09-27 DIAGNOSIS — R404 Transient alteration of awareness: Secondary | ICD-10-CM | POA: Diagnosis not present

## 2016-09-27 LAB — URINALYSIS, ROUTINE W REFLEX MICROSCOPIC
BILIRUBIN URINE: NEGATIVE
GLUCOSE, UA: NEGATIVE mg/dL
HGB URINE DIPSTICK: NEGATIVE
KETONES UR: NEGATIVE mg/dL
Leukocytes, UA: NEGATIVE
Nitrite: NEGATIVE
PROTEIN: NEGATIVE mg/dL
Specific Gravity, Urine: 1.004 — ABNORMAL LOW (ref 1.005–1.030)
pH: 6 (ref 5.0–8.0)

## 2016-09-27 LAB — CBC
HCT: 34.2 % — ABNORMAL LOW (ref 36.0–46.0)
Hemoglobin: 10.9 g/dL — ABNORMAL LOW (ref 12.0–15.0)
MCH: 27.7 pg (ref 26.0–34.0)
MCHC: 31.9 g/dL (ref 30.0–36.0)
MCV: 87 fL (ref 78.0–100.0)
PLATELETS: 198 10*3/uL (ref 150–400)
RBC: 3.93 MIL/uL (ref 3.87–5.11)
RDW: 14.3 % (ref 11.5–15.5)
WBC: 4.5 10*3/uL (ref 4.0–10.5)

## 2016-09-27 LAB — BASIC METABOLIC PANEL
Anion gap: 7 (ref 5–15)
BUN: 10 mg/dL (ref 6–20)
CHLORIDE: 105 mmol/L (ref 101–111)
CO2: 26 mmol/L (ref 22–32)
CREATININE: 0.77 mg/dL (ref 0.44–1.00)
Calcium: 8.9 mg/dL (ref 8.9–10.3)
GFR calc Af Amer: >60 mL/min (ref >60)
GFR calc non Af Amer: >60 mL/min (ref >60)
Glucose, Bld: 116 mg/dL — ABNORMAL HIGH (ref 65–99)
Potassium: 3.7 mmol/L (ref 3.5–5.1)
SODIUM: 138 mmol/L (ref 135–145)

## 2016-09-27 NOTE — ED Provider Notes (Signed)
[facility] DEPT Provider Note   CSN: [id number] Arrival date & time: 09/27/16  [place]     History   Chief Complaint Chief Complaint  Patient presents with  . Shortness of Breath  . Near Syncope    HPI [name] is a 66 y.o. female.  She presents for evaluation of syncope associated with an episode of coughing.  She reports having chronic cough for several years, currently productive of white sputum.  She denies fever, chills, nausea, vomiting, headache, chest pain, new weakness or paresthesia.  She has chronic left hemiplegia secondary to a stroke.  Previously seen here with hygroma, states she saw her PCP afterwards but did not see a specialist.  This was originally noted on 08/29/15.  She had a follow-up scan here, 02/27/16, during evaluation for headache.  Follow-up scan did not show progression or other complications of chronic hygroma.  Patient states that she has been eating well recently.  She is taking her medication as prescribed.  Today she recalls coughing, her grandson was with her, and he caught her as she slumped, but she did not fall to the floor.  She apparently awoke quickly and was able to resume her usual activities.  She typically walks using a cane because of her left-sided weakness.  There are no other known modifying factors.  HPI  Past Medical History:  Diagnosis Date  . Arthritis   . Difficulty sleeping   . Gallstones   . Hyperlipidemia   . Hypertension   . Preoperative clearance   . Spasmodic cough    "IT COMES IN DIFFERENT SEASONS"  . Stroke ([place]) 2015   WEAKNESS L SIDE OF BODY     Patient Active Problem List   Diagnosis Date Noted  . Chronic cholecystitis 04/12/2015  . Preoperative clearance 03/17/2015  . Spastic hemiplegia affecting nondominant side ([place]) 10/27/2013  . Adhesive capsulitis of right shoulder 10/27/2013  . Chronic diastolic heart failure ([place]) 08/26/2013  . Dyslipidemia 08/26/2013  . Borderline diabetes 08/26/2013  . CVA  (cerebral infarction) 08/26/2013  . Acute CVA (cerebrovascular accident) ([place]) 08/23/2013  . Pre-syncope 08/17/2012  . Hypokalemia 08/17/2012  . ARTHRITIS 03/28/2010  . DE QUERVAIN'S TENOSYNOVITIS 03/28/2010  . Normocytic anemia 07/26/2009  . Asthma 03/09/2009  . DENTAL CARIES 03/09/2009  . CANDIDIASIS OF VULVA AND VAGINA 06/04/2008  . SKIN TAG 06/04/2008  . ARTHRITIS, KNEES, BILATERAL 05/22/2008  . HYPERTENSION, BENIGN ESSENTIAL 05/04/2008  . KNEE PAIN, BILATERAL 05/04/2008  . COUGH 05/04/2008  . MENISCUS TEAR, RIGHT 12/07/2005    Past Surgical History:  Procedure Laterality Date  . CHOLECYSTECTOMY N/A 04/12/2015   Procedure: LAPAROSCOPIC CHOLECYSTECTOMY WITH INTRAOPERATIVE CHOLANGIOGRAM;  Surgeon: Greer Pickerel, MD;  Location: [facility];  Service: General;  Laterality: N/A;  . KNEE ARTHROSCOPY  2010    OB History    No data available       Home Medications    Prior to Admission medications   Medication Sig Start Date End Date Taking? Authorizing Provider  atorvastatin (LIPITOR) 40 MG tablet Take 1 tablet (40 mg total) by mouth daily at 6 PM. 09/18/13  Yes Angiulli, Lavon Paganini, PA-C  losartan-hydrochlorothiazide (HYZAAR) 50-12.5 MG tablet Take 1 tablet by mouth at bedtime.  05/24/15  Yes [provider]  pantoprazole (PROTONIX) 20 MG tablet Take 20 mg by mouth daily.   Yes [provider]  methocarbamol (ROBAXIN) 500 MG tablet Take 1 tablet (500 mg total) by mouth 2 (two) times daily. Patient not taking: Reported on 08/29/2015  06/30/15   Larene Pickett, PA-C  metoCLOPramide (REGLAN) 5 MG tablet Take 1 tablet (5 mg total) by mouth every 8 (eight) hours as needed for nausea. Patient not taking: Reported on 09/27/2016 02/27/16   Elnora Morrison, MD  naproxen (NAPROSYN) 500 MG tablet Take 1 tablet (500 mg total) by mouth 2 (two) times daily with a meal. Patient not taking: Reported on 08/29/2015 06/30/15   Larene Pickett, PA-C    Family History No family history on  file.  Social History Social History  Substance Use Topics  . Smoking status: Former Smoker    Quit date: 04/08/1994  . Smokeless tobacco: Never Used  . Alcohol use No     Allergies   Patient has no known allergies.   Review of Systems Review of Systems  All other systems reviewed and are negative.    Physical Exam Updated Vital Signs BP 136/72   Pulse 93   Temp 98.5 F (36.9 C) (Oral)   Resp 15   Ht 5\' 3"  (1.6 m)   Wt 107.5 kg (237 lb)   SpO2 95%   BMI 41.98 kg/m   Physical Exam  Constitutional: She is oriented to person, place, and time. She appears well-developed.  Elderly, frail  HENT:  Head: Normocephalic and atraumatic.  Eyes: Pupils are equal, round, and reactive to light. Conjunctivae and EOM are normal.  Neck: Normal range of motion and phonation normal. Neck supple.  Cardiovascular: Normal rate and regular rhythm.   Pulmonary/Chest: Effort normal and breath sounds normal. She exhibits no tenderness.  Abdominal: Soft. She exhibits no distension. There is no tenderness. There is no guarding.  Musculoskeletal: Normal range of motion.  Neurological: She is alert and oriented to person, place, and time.  Mild dysarthria.  No aphasia.  No truncal ataxia.  Moderate left arm and leg weakness, consistent with prior CVA.  Skin: Skin is warm and dry.  Psychiatric: She has a normal mood and affect. Her behavior is normal. Judgment and thought content normal.  Nursing note and vitals reviewed.    ED Treatments / Results  Labs (all labs ordered are listed, but only abnormal results are displayed) Labs Reviewed  BASIC METABOLIC PANEL - Abnormal; Notable for the following:       Result Value   Glucose, Bld 116 (*)    All other components within normal limits  CBC - Abnormal; Notable for the following:    Hemoglobin 10.9 (*)    HCT 34.2 (*)    All other components within normal limits  URINALYSIS, ROUTINE W REFLEX MICROSCOPIC - Abnormal; Notable for the  following:    Color, Urine STRAW (*)    Specific Gravity, Urine 1.004 (*)    All other components within normal limits  CBG MONITORING, ED    EKG  EKG Interpretation  Date/Time:  Wednesday September 27 2016 18:47:52 EDT Ventricular Rate:  82 PR Interval:    QRS Duration: 109 QT Interval:  393 QTC Calculation: 459 R Axis:   44 Text Interpretation:  Sinus rhythm since last tracing no significant change Confirmed by Daleen Bo [phone]) on 09/27/2016 7:20:44 PM       Radiology Dg Chest 2 View  Result Date: 09/27/2016 CLINICAL DATA:  Cough.  Shortness of breath. EXAM: CHEST  2 VIEW COMPARISON:  Radiographs and CT 03/27/2015 FINDINGS: The cardiomediastinal contours are normal. Pulmonary vasculature is normal. No consolidation, pleural effusion, or pneumothorax. No acute osseous abnormalities are seen. Flowing osteophytes throughout the mid lower  thoracic spine. IMPRESSION: No acute pulmonary process. Electronically Signed   By: Jeb Levering M.D.   On: 09/27/2016 21:20    Procedures Procedures (including critical care time)  Medications Ordered in ED Medications - No data to display   Initial Impression / Assessment and Plan / ED Course  I have reviewed the triage vital signs and the nursing notes.  Pertinent labs & imaging results that were available during my care of the patient were reviewed by me and considered in my medical decision making (see chart for details).      Patient Vitals for the past 24 hrs:  BP Temp Temp src Pulse Resp SpO2 Height Weight  09/27/16 2300 136/72 - - 93 15 95 % - -  09/27/16 2245 128/72 - - 86 13 94 % - -  09/27/16 2215 128/68 - - 89 15 94 % - -  09/27/16 2200 138/70 - - 89 15 94 % - -  09/27/16 2145 127/82 - - 91 (!) 29 96 % - -  09/27/16 2130 (!) 144/82 - - 88 18 96 % - -  09/27/16 2115 124/73 - - 88 15 94 % - -  09/27/16 2030 (!) 160/81 - - 88 (!) 26 98 % - -  09/27/16 2015 (!) 137/95 - - 80 15 97 % - -  09/27/16 1945 (!) 146/85 - -  84 17 92 % - -  09/27/16 1851 (!) 141/80 98.5 F (36.9 C) Oral 83 18 96 % - -  09/27/16 1845 (!) 141/80 - - 87 - 97 % - -  09/27/16 1840 - - - - - 95 % 5\' 3"  (1.6 m) 107.5 kg (237 lb)    11:22 PM Reevaluation with update and discussion. After initial assessment and treatment, an updated evaluation reveals patient remains comfortable.  She has no further complaints.  Findings discussed with patient and her family member who has now arrived.  This family member states that the patient has previously followed up with neurosurgery as was recommended, regarding her hygroma.  They are aware of this fluid collection, present in the right brain.  Findings discussed with patient and family members, all questions were answered. Osiah Haring L     Final Clinical Impressions(s) / ED Diagnoses   Final diagnoses:  Syncope and collapse    Cough associated syncope, without findings for acute cardiac syndrome, serious bacterial infection, metabolic instability or impending vascular collapse.  Nursing Notes Reviewed/ Care Coordinated Applicable Imaging Reviewed Interpretation of Laboratory Data incorporated into ED treatment  The patient appears reasonably screened and/or stabilized for discharge and I doubt any other medical condition or other [facility] requiring further screening, evaluation, or treatment in the ED at this time prior to discharge.  Plan: Home Medications-continue usual medications; Home Treatments-rest, drink plenty of fluids; return here if the recommended treatment, does not improve the symptoms; Recommended follow up-PCP checkup 1 or 2 weeks and as needed.   New Prescriptions New Prescriptions   No medications on file     Daleen Bo, MD 09/27/16 2329

## 2016-09-27 NOTE — Discharge Instructions (Signed)
The testing today did not show any serious problems, associated with the episode of fainting, when you are coughing.  Make sure that you are drinking plenty of fluids, and taking her usual medications.  Call your doctor for a follow-up appointment.  Return here, if needed, for problems.

## 2016-09-27 NOTE — ED Notes (Signed)
Pt awaiting reeval, Dr. Eulis Foster aware.

## 2016-09-27 NOTE — ED Notes (Signed)
Pt and family verbalized understanding of d/c materials.

## 2016-09-27 NOTE — ED Notes (Signed)
Pt given [place] sandwich and ice water and encouraged to eat. Pt verbalized understanding for need for UA, will attempt to urinate in approx 30 mins after drinking some fluid.

## 2016-09-27 NOTE — ED Triage Notes (Signed)
Pt from home via [facility] after possible witnessed syncopal episode. Per EMS pt with hx of chronic productive cough that has been worsening over the years. Pt's grandson reports that pt was having a coughing fit when she slumped over in her chair with her eyes wide open and appeared to be "gasping to breathe." Pt A&Ox4 on EMS arrival, 12-lead unremarkable. Pt reports increased SOB and fatigue for the past few weeks. 158/90, HR 80, RR 18, 95% on 2L [place], CBG 113.

## 2016-09-27 NOTE — ED Notes (Signed)
ED Provider at bedside. 

## 2016-09-27 NOTE — ED Notes (Signed)
Pt able to eat [place] sandwic and crackers with no N/V.

## 2016-10-02 ENCOUNTER — Ambulatory Visit: Payer: Medicare HMO | Attending: Family Medicine | Admitting: Occupational Therapy

## 2016-10-02 DIAGNOSIS — M25512 Pain in left shoulder: Secondary | ICD-10-CM | POA: Insufficient documentation

## 2016-10-02 DIAGNOSIS — R278 Other lack of coordination: Secondary | ICD-10-CM

## 2016-10-02 DIAGNOSIS — M6281 Muscle weakness (generalized): Secondary | ICD-10-CM | POA: Insufficient documentation

## 2016-10-02 DIAGNOSIS — I69354 Hemiplegia and hemiparesis following cerebral infarction affecting left non-dominant side: Secondary | ICD-10-CM | POA: Insufficient documentation

## 2016-10-02 DIAGNOSIS — G8929 Other chronic pain: Secondary | ICD-10-CM | POA: Diagnosis not present

## 2016-10-02 NOTE — Therapy (Signed)
[facility] [address] [place], [place], [address] Phone: [phone]   Fax:  [phone]  Occupational Therapy Evaluation  Patient Details  Name: [name] MRN: [id number] Date of Birth: 11/28/1950 Referring Provider: Dr. [name]   Encounter Date: 10/02/2016      OT End of Session - 10/02/16 1254    Visit Number 1   Number of Visits 9   Date for OT Re-Evaluation 11/02/16   Authorization Type [employer] MCR/MCD - G code needed   Authorization - Visit Number 1   Authorization - Number of Visits 10   OT Start Time 0930   OT Stop Time 1015   OT Time Calculation (min) 45 min   Activity Tolerance Patient tolerated treatment well      Past Medical History:  Diagnosis Date  . Arthritis   . Difficulty sleeping   . Gallstones   . Hyperlipidemia   . Hypertension   . Preoperative clearance   . Spasmodic cough    "IT COMES IN DIFFERENT SEASONS"  . Stroke ([place]) 2015   WEAKNESS L SIDE OF BODY     Past Surgical History:  Procedure Laterality Date  . CHOLECYSTECTOMY N/A 04/12/2015   Procedure: LAPAROSCOPIC CHOLECYSTECTOMY WITH INTRAOPERATIVE CHOLANGIOGRAM;  Surgeon: Greer Pickerel, MD;  Location: [facility];  Service: General;  Laterality: N/A;  . KNEE ARTHROSCOPY  2010    There were no vitals filed for this visit.      Subjective Assessment - 10/02/16 0938    Pertinent History poly-arthritis, Lt hemiplegia from CVA 08/2013, HTN, recent episode of syncope 09/27/16   Patient Stated Goals Be able to stretch my arm better without pain   Currently in Pain? Yes   Pain Score 2    Pain Location Shoulder   Pain Orientation Left   Pain Descriptors / Indicators Sore   Pain Type Chronic pain   Pain Onset More than a month ago   Pain Frequency Intermittent   Aggravating Factors  only when attemtping to move it   Pain Relieving Factors rest           [facility] OT Assessment - 10/02/16 0001      Assessment   Diagnosis  poly-arthritis   Referring Provider Dr. [name]    Onset Date --  years   Prior Therapy outpatient therapy 2015     Precautions   Precautions None     Balance Screen   Has the patient fallen in the past 6 months Yes   How many times? 4   Has the patient had a decrease in activity level because of a fear of falling?  Yes   Is the patient reluctant to leave their home because of a fear of falling?  No     Home  Environment   Type of [place] One level   Alternate Level Stairs - Number of Steps 3 to enter   Bathroom Shower/Tub Tub/Shower unit;Curtain   Adaptive equipment --  Sales executive - single point;Bedside commode;Tub bench   Lives With Alone     Prior Function   Level of Independence Independent   Vocation On disability     ADL   Eating/Feeding Modified independent   Grooming Modified independent  daughter does hair   Upper Body Bathing Modified independent   Lower Body Bathing Modified independent   Upper Body Dressing --  Mod I  Lower Body Dressing Modified independent   Toilet Transfer Independent   Toileting - Clothing Manipulation Modified independent   [place] Transfer Modified independent     IADL   Shopping Takes care of all shopping needs independently   Light Housekeeping Maintains house alone or with occasional assistance   Meal Prep Plans, prepares and serves adequate meals independently  slower, mod I   Programmer, applications own vehicle   Medication Management Is responsible for taking medication in correct dosages at correct time   Physiological scientist financial matters independently (budgets, writes checks, pays rent, bills goes to bank), collects and keeps track of income     Mobility   Mobility Status Independent  with cane     Written Expression   Dominant Hand Right   Handwriting --  denies change     Vision - History    Baseline Vision Wears glasses only for reading   Additional Comments Pt reports vision is fine     Activity Tolerance   Activity Tolerance Tolerate 30+ min activity without fatigue     Observation/Other Assessments   Observations difficulty with alternating and divided attention, reports mental fatigue     Sensation   Additional Comments reports Lt hand intact for feeling, but diminshed compared to Rt hand     Coordination   9 Hole Peg Test Right;Left   Right 9 Hole Peg Test 20.69 sec   Left 9 Hole Peg Test placed 1 peg in 1 minute   Box and Blocks Lt = 12     Edema   Edema mild Lt hand     Tone   Assessment Location Left Upper Extremity     ROM / Strength   AROM / PROM / Strength AROM     AROM   Overall AROM Comments RUE AROM WFLs. LUE: Shoulder goes into approx 45* abduction when attempting sh. flex. Elbow flex 90%, ext 60% into gravity. Supination to neutral, pronation 75%, wrist ext to neutral, gross finger flexion WFL's, gross finger ext 90%. LUE dominated by synergy pattern, but noted pt can use as stabalizer to min assist for functional tasks.      Hand Function   Right Hand Grip (lbs) 44 lbs   Left Hand Grip (lbs) 10 lbs     LUE Tone   LUE Tone Mild;Hypertonic  2/4 on MAS with elbow ext, wrist ext                              OT Long Term Goals - 10/02/16 1259      OT LONG TERM GOAL #1   Title Pt will be independent with updated HEP for LUE   Time 4   Period Weeks   Status New   Target Date 11/02/16     OT LONG TERM GOAL #2   Title Pt will be independent with splint wear and care   Time 4   Period Weeks   Status New     OT LONG TERM GOAL #3   Title Pt will demo understanding of joint protection techniques, task modifications, and general HEP to decrease joint stiffness and pain associated with arthritis   Time 4   Period Weeks   Status New     OT LONG TERM GOAL #4   Title Pt to verbalize understanding with A/E to increase  ease, safety, and independence for cooking tasks  Time 4   Period Weeks   Status New     OT LONG TERM GOAL #5   Title Pt to verbalize understanding with pain reduction strategies for LUE including proper positioning and modalities prn   Time 4   Period Weeks   Status New               Plan - 10/02/16 1255    Clinical Impression Statement Pt is a 66 y.o. female who presents to outpatient rehab with poly-arthritis and referred for "arm therapy and exercise". Pt with chronic Lt hemiplegia from CVA July 2015. Pt presents today with pain Lt shoulder, decreased ROM, tone LUE and decr. functional use. Pt does not appear to be limited in RUE from arthritis.    Occupational Profile and client history currently impacting functional performance Lt hemiplegia from CVA, HTN, recent syncope episode, decr. balance   Occupational performance deficits (Please refer to evaluation for details): IADL's;Leisure;Social Participation   Rehab Potential Good   OT Frequency 2x / week   OT Duration 4 weeks  or 1x/wk for 8 weeks - d/t pt's copay (plus evaluation)   OT Treatment/Interventions Self-care/ADL training;Moist Heat;DME and/or AE instruction;Splinting;Patient/family education;Therapeutic exercises;Ultrasound;Therapeutic activities;Aquatic Therapy;Neuromuscular education;Functional Mobility Training;Passive range of motion;Manual Therapy;Parrafin   Plan fabricate resting hand splint for Lt hand   Clinical Decision Making Limited treatment options, no task modification necessary   Consulted and Agree with Plan of Care Patient      Patient will benefit from skilled therapeutic intervention in order to improve the following deficits and impairments:  Decreased range of motion, Increased edema, Decreased safety awareness, Impaired tone, Impaired UE functional use, Pain, Decreased knowledge of use of DME, Decreased mobility, Decreased strength  Visit Diagnosis: Hemiplegia and hemiparesis following  cerebral infarction affecting left non-dominant side (HCC) - Plan: Ot plan of care cert/re-cert  Chronic left shoulder pain - Plan: Ot plan of care cert/re-cert  Other lack of coordination - Plan: Ot plan of care cert/re-cert  Muscle weakness (generalized) - Plan: Ot plan of care cert/re-cert      G-Codes - 56/38/75 1302    Functional Assessment Tool Used (Outpatient only) clinical judgement for LUE and generalized for arthritis   Functional Limitation Changing and maintaining body position   Changing and Maintaining Body Position Current Status (I4332) At least 20 percent but less than 40 percent impaired, limited or restricted   Changing and Maintaining Body Position Goal Status (R5188) At least 1 percent but less than 20 percent impaired, limited or restricted      Problem List Patient Active Problem List   Diagnosis Date Noted  . Chronic cholecystitis 04/12/2015  . Preoperative clearance 03/17/2015  . Spastic hemiplegia affecting nondominant side ([place]) 10/27/2013  . Adhesive capsulitis of right shoulder 10/27/2013  . Chronic diastolic heart failure ([place]) 08/26/2013  . Dyslipidemia 08/26/2013  . Borderline diabetes 08/26/2013  . CVA (cerebral infarction) 08/26/2013  . Acute CVA (cerebrovascular accident) ([place]) 08/23/2013  . Pre-syncope 08/17/2012  . Hypokalemia 08/17/2012  . ARTHRITIS 03/28/2010  . DE QUERVAIN'S TENOSYNOVITIS 03/28/2010  . Normocytic anemia 07/26/2009  . Asthma 03/09/2009  . DENTAL CARIES 03/09/2009  . CANDIDIASIS OF VULVA AND VAGINA 06/04/2008  . SKIN TAG 06/04/2008  . ARTHRITIS, KNEES, BILATERAL 05/22/2008  . HYPERTENSION, BENIGN ESSENTIAL 05/04/2008  . KNEE PAIN, BILATERAL 05/04/2008  . COUGH 05/04/2008  . MENISCUS TEAR, RIGHT 12/07/2005    Carey Bullocks, OTR/L 10/02/2016, 1:05 PM  [facility] [address]  [place], [place], [address] Phone: [phone]   Fax:   [phone]  Name: [name] MRN: [id number] Date of Birth: Oct 06, 1950

## 2016-10-03 DIAGNOSIS — K219 Gastro-esophageal reflux disease without esophagitis: Secondary | ICD-10-CM | POA: Diagnosis not present

## 2016-10-03 DIAGNOSIS — R05 Cough: Secondary | ICD-10-CM | POA: Diagnosis not present

## 2016-10-10 ENCOUNTER — Encounter: Payer: Self-pay | Admitting: Occupational Therapy

## 2016-10-10 ENCOUNTER — Ambulatory Visit: Payer: Medicare HMO | Attending: Family Medicine | Admitting: Occupational Therapy

## 2016-10-10 DIAGNOSIS — M25512 Pain in left shoulder: Secondary | ICD-10-CM | POA: Diagnosis not present

## 2016-10-10 DIAGNOSIS — G8929 Other chronic pain: Secondary | ICD-10-CM

## 2016-10-10 DIAGNOSIS — M6281 Muscle weakness (generalized): Secondary | ICD-10-CM

## 2016-10-10 DIAGNOSIS — R278 Other lack of coordination: Secondary | ICD-10-CM

## 2016-10-10 DIAGNOSIS — I69354 Hemiplegia and hemiparesis following cerebral infarction affecting left non-dominant side: Secondary | ICD-10-CM | POA: Insufficient documentation

## 2016-10-10 NOTE — Patient Instructions (Signed)
Your Splint This splint should initially be fitted by a healthcare practitioner.  The healthcare practitioner is responsible for providing wearing instructions and precautions to the patient, other healthcare practitioners and care provider involved in the patient's care.  This splint was custom made for you. Please read the following instructions to learn about wearing and caring for your splint.  Precautions Should your splint cause any of the following problems, remove the splint immediately and contact your therapist/physician.  Swelling  Severe Pain  Pressure Areas  Stiffness  Numbness  Do not wear your splint while operating machinery unless it has been fabricated for that purpose.  When To Wear Your Splint Where your splint according to your therapist/physician instructions.  Day One (Today);  Wear 2 hours in the morning and 2 hours in the evening (make sure there are 2 hours that it is off in between). If no problems Day Two: Wednesday:  Wear 2 hours in the morning and 2 hours in the evening. If no problems Day Three:  Thursday: Wear 4 hours in the morning and 4 hours in the evening. If no problems Day Four:  Friday:  Wear 4 hours in the morning and 4 hours in the evening. If no problems Day Five:  Saturday:  Wear all night. DO NOT WEAR DURING THE DAY AT THIS POINT. As long as no problems, can wear at night from now on when you are sleeping. Do not wear during the day.  Care and Cleaning of Your Splint 1. Keep your splint away from open flames. 2. Your splint will lose its shape in temperatures over 135 degrees Farenheit, ( in car windows, near radiators, ovens or in hot water).  Never make any adjustments to your splint, if the splint needs adjusting remove it and make an appointment to see your therapist. 3. Your splint, including the cushion liner may be cleaned with soap and lukewarm water.  Do not immerse in hot water over 135 degrees Farenheit. 4. Straps may be washed  with soap and water, but do not moisten the self-adhesive portion. 5. For ink or hard to remove spots use a scouring cleanser which contains chlorine.  Rinse the splint thoroughly after using chlorine cleanser.

## 2016-10-10 NOTE — Therapy (Signed)
[facility] [address] [place], [place], [address] Phone: [phone]   Fax:  [phone]  Occupational Therapy Treatment  Patient Details  Name: [name] MRN: [id number] Date of Birth: 09-29-1950 Referring Provider: Dr. [name]   Encounter Date: 10/10/2016      OT End of Session - 10/10/16 1140    Visit Number 2   Number of Visits 9   Date for OT Re-Evaluation 11/02/16   Authorization Type [employer] MCR/MCD - G code needed   Authorization - Visit Number 2   Authorization - Number of Visits 10   OT Start Time [phone]   OT Stop Time 1014   OT Time Calculation (min) 43 min   Activity Tolerance Patient tolerated treatment well      Past Medical History:  Diagnosis Date  . Arthritis   . Difficulty sleeping   . Gallstones   . Hyperlipidemia   . Hypertension   . Preoperative clearance   . Spasmodic cough    "IT COMES IN DIFFERENT SEASONS"  . Stroke ([place]) 2015   WEAKNESS L SIDE OF BODY     Past Surgical History:  Procedure Laterality Date  . CHOLECYSTECTOMY N/A 04/12/2015   Procedure: LAPAROSCOPIC CHOLECYSTECTOMY WITH INTRAOPERATIVE CHOLANGIOGRAM;  Surgeon: Greer Pickerel, MD;  Location: [facility];  Service: General;  Laterality: N/A;  . KNEE ARTHROSCOPY  2010    There were no vitals filed for this visit.      Subjective Assessment - 10/10/16 0935    Subjective  I like this splint   Pertinent History poly-arthritis, Lt hemiplegia from CVA 08/2013, HTN, recent episode of syncope 09/27/16   Patient Stated Goals Be able to stretch my arm better without pain   Currently in Pain? No/denies                      OT Treatments/Exercises (OP) - 10/10/16 0001      Splinting   Splinting Fabircated resting hand splint  for LUE to provide supported alignment, decrease tightness and tone and reduce pain due to poly -arthritis. Also reviewed wearing schedule as well as how to build up tolerance to splint  and care of splint. Pt verbalized understanding and given in written format. Pt able to don and doff splint.                 OT Education - 10/10/16 1138    Education provided Yes   Education Details splint wear and care   Person(s) Educated Patient   Methods Explanation;Handout   Comprehension Verbalized understanding;Returned demonstration  donning and doffing splint             OT Long Term Goals - 10/10/16 1139      OT LONG TERM GOAL #1   Title Pt will be independent with updated HEP for LUE   Time 4   Period Weeks   Status On-going     OT LONG TERM GOAL #2   Title Pt will be independent with splint wear and care   Time 4   Period Weeks   Status On-going     OT LONG TERM GOAL #3   Title Pt will demo understanding of joint protection techniques, task modifications, and general HEP to decrease joint stiffness and pain associated with arthritis   Time 4   Period Weeks   Status On-going     OT LONG TERM GOAL #4   Title Pt to verbalize understanding  with A/E to increase ease, safety, and independence for cooking tasks   Time 4   Period Weeks   Status On-going     OT LONG TERM GOAL #5   Title Pt to verbalize understanding with pain reduction strategies for LUE including proper positioning and modalities prn   Time 4   Period Weeks   Status On-going               Plan - 10/10/16 1139    Clinical Impression Statement Pt progressing toward goals. Pt verbalized understanding of all splint information   Rehab Potential Good   OT Frequency 2x / week   OT Duration 4 weeks   OT Treatment/Interventions Self-care/ADL training;Moist Heat;DME and/or AE instruction;Splinting;Patient/family education;Therapeutic exercises;Ultrasound;Therapeutic activities;Aquatic Therapy;Neuromuscular education;Functional Mobility Training;Passive range of motion;Manual Therapy;Parrafin   Plan check splint for fit, address ADL goals, address any AE indicated   Consulted and  Agree with Plan of Care Patient      Patient will benefit from skilled therapeutic intervention in order to improve the following deficits and impairments:  Decreased range of motion, Increased edema, Decreased safety awareness, Impaired tone, Impaired UE functional use, Pain, Decreased knowledge of use of DME, Decreased mobility, Decreased strength  Visit Diagnosis: Hemiplegia and hemiparesis following cerebral infarction affecting left non-dominant side (HCC)  Chronic left shoulder pain  Other lack of coordination  Muscle weakness (generalized)    Problem List Patient Active Problem List   Diagnosis Date Noted  . Chronic cholecystitis 04/12/2015  . Preoperative clearance 03/17/2015  . Spastic hemiplegia affecting nondominant side ([place]) 10/27/2013  . Adhesive capsulitis of right shoulder 10/27/2013  . Chronic diastolic heart failure ([place]) 08/26/2013  . Dyslipidemia 08/26/2013  . Borderline diabetes 08/26/2013  . CVA (cerebral infarction) 08/26/2013  . Acute CVA (cerebrovascular accident) ([place]) 08/23/2013  . Pre-syncope 08/17/2012  . Hypokalemia 08/17/2012  . ARTHRITIS 03/28/2010  . DE QUERVAIN'S TENOSYNOVITIS 03/28/2010  . Normocytic anemia 07/26/2009  . Asthma 03/09/2009  . DENTAL CARIES 03/09/2009  . CANDIDIASIS OF VULVA AND VAGINA 06/04/2008  . SKIN TAG 06/04/2008  . ARTHRITIS, KNEES, BILATERAL 05/22/2008  . HYPERTENSION, BENIGN ESSENTIAL 05/04/2008  . KNEE PAIN, BILATERAL 05/04/2008  . COUGH 05/04/2008  . MENISCUS TEAR, RIGHT 12/07/2005    Quay Burow, OTR/L 10/10/2016, 11:41 AM  [facility] [address] [place], [place], [address] Phone: [phone]   Fax:  [phone]  Name: [name] MRN: [id number] Date of Birth: July 21, 1950

## 2016-10-11 ENCOUNTER — Ambulatory Visit: Payer: Medicare HMO | Admitting: Occupational Therapy

## 2016-10-17 ENCOUNTER — Ambulatory Visit: Payer: Medicare HMO | Admitting: Occupational Therapy

## 2016-10-17 DIAGNOSIS — G8929 Other chronic pain: Secondary | ICD-10-CM | POA: Diagnosis not present

## 2016-10-17 DIAGNOSIS — M25512 Pain in left shoulder: Secondary | ICD-10-CM | POA: Diagnosis not present

## 2016-10-17 DIAGNOSIS — I69354 Hemiplegia and hemiparesis following cerebral infarction affecting left non-dominant side: Secondary | ICD-10-CM | POA: Diagnosis not present

## 2016-10-17 DIAGNOSIS — R278 Other lack of coordination: Secondary | ICD-10-CM

## 2016-10-17 DIAGNOSIS — M6281 Muscle weakness (generalized): Secondary | ICD-10-CM | POA: Diagnosis not present

## 2016-10-17 NOTE — Therapy (Signed)
[facility] [address] [place], [place], [address] Phone: [phone]   Fax:  [phone]  Occupational Therapy Treatment  Patient Details  Name: [name] MRN: [id number] Date of Birth: 10-05-1950 Referring Provider: Dr. [name]   Encounter Date: 10/17/2016      OT End of Session - 10/17/16 1200    Visit Number 3   Number of Visits 9   Date for OT Re-Evaluation 11/02/16   Authorization Type [employer] MCR/MCD - G code needed   Authorization - Visit Number 3   Authorization - Number of Visits 10   OT Start Time 9030   OT Stop Time 1100   OT Time Calculation (min) 45 min   Activity Tolerance Patient tolerated treatment well      Past Medical History:  Diagnosis Date  . Arthritis   . Difficulty sleeping   . Gallstones   . Hyperlipidemia   . Hypertension   . Preoperative clearance   . Spasmodic cough    "IT COMES IN DIFFERENT SEASONS"  . Stroke ([place]) 2015   WEAKNESS L SIDE OF BODY     Past Surgical History:  Procedure Laterality Date  . CHOLECYSTECTOMY N/A 04/12/2015   Procedure: LAPAROSCOPIC CHOLECYSTECTOMY WITH INTRAOPERATIVE CHOLANGIOGRAM;  Surgeon: Greer Pickerel, MD;  Location: [facility];  Service: General;  Laterality: N/A;  . KNEE ARTHROSCOPY  2010    There were no vitals filed for this visit.      Subjective Assessment - 10/17/16 1020    Subjective  The splint feels fine and working fine   Pertinent History poly-arthritis, Lt hemiplegia from CVA 08/2013, HTN, recent episode of syncope 09/27/16   Patient Stated Goals Be able to stretch my arm better without pain   Currently in Pain? No/denies                      OT Treatments/Exercises (OP) - 10/17/16 0001      ADLs   ADL Comments Pt shown recommended A/E for cooking tasks for greater ease and safety including: one handed cutting board, pot stabalizer, one handed can opener and jar opener. Pt provided handouts and told how/where  to purchase. Also discussed at length task modifications, joint protection techniques and pain strategies associated with arthritis. Pt issued arthritis booklet and reviewed.      Splinting   Splinting Pt reports splint is fitting well and no adjustments need to be made. Pt reports tolerating at night w/o complications                OT Education - 10/17/16 1200    Education provided Yes   Education Details A/E recommendations, task modifications/strategies, joint protection techniques, and pain management associated with arthritis   Person(s) Educated Patient   Methods Explanation;Demonstration;Handout   Comprehension Verbalized understanding             OT Long Term Goals - 10/17/16 1201      OT LONG TERM GOAL #1   Title Pt will be independent with updated HEP for LUE   Time 4   Period Weeks   Status On-going     OT LONG TERM GOAL #2   Title Pt will be independent with splint wear and care   Time 4   Period Weeks   Status Achieved     OT LONG TERM GOAL #3   Title Pt will demo understanding of joint protection techniques, task modifications, and general HEP to decrease  joint stiffness and pain associated with arthritis   Time 4   Period Weeks   Status Achieved     OT LONG TERM GOAL #4   Title Pt to verbalize understanding with A/E to increase ease, safety, and independence for cooking tasks   Time 4   Period Weeks   Status Achieved     OT LONG TERM GOAL #5   Title Pt to verbalize understanding with pain reduction strategies for LUE including proper positioning and modalities prn   Time 4   Period Weeks   Status On-going               Plan - 10/17/16 1202    Clinical Impression Statement Pt has met LTG's #2, 3, and 4. Pt progressing towards remaining goals.    Rehab Potential Good   OT Frequency 2x / week   OT Duration 4 weeks   OT Treatment/Interventions Self-care/ADL training;Moist Heat;DME and/or AE instruction;Splinting;Patient/family  education;Therapeutic exercises;Ultrasound;Therapeutic activities;Aquatic Therapy;Neuromuscular education;Functional Mobility Training;Passive range of motion;Manual Therapy;Parrafin   Plan address LTG #1   Consulted and Agree with Plan of Care Patient      Patient will benefit from skilled therapeutic intervention in order to improve the following deficits and impairments:  Decreased range of motion, Increased edema, Decreased safety awareness, Impaired tone, Impaired UE functional use, Pain, Decreased knowledge of use of DME, Decreased mobility, Decreased strength  Visit Diagnosis: Other lack of coordination  Muscle weakness (generalized)    Problem List Patient Active Problem List   Diagnosis Date Noted  . Chronic cholecystitis 04/12/2015  . Preoperative clearance 03/17/2015  . Spastic hemiplegia affecting nondominant side ([facility]) 10/27/2013  . Adhesive capsulitis of right shoulder 10/27/2013  . Chronic diastolic heart failure ([place]) 08/26/2013  . Dyslipidemia 08/26/2013  . Borderline diabetes 08/26/2013  . CVA (cerebral infarction) 08/26/2013  . Acute CVA (cerebrovascular accident) ([place]) 08/23/2013  . Pre-syncope 08/17/2012  . Hypokalemia 08/17/2012  . ARTHRITIS 03/28/2010  . DE QUERVAIN'S TENOSYNOVITIS 03/28/2010  . Normocytic anemia 07/26/2009  . Asthma 03/09/2009  . DENTAL CARIES 03/09/2009  . CANDIDIASIS OF VULVA AND VAGINA 06/04/2008  . SKIN TAG 06/04/2008  . ARTHRITIS, KNEES, BILATERAL 05/22/2008  . HYPERTENSION, BENIGN ESSENTIAL 05/04/2008  . KNEE PAIN, BILATERAL 05/04/2008  . COUGH 05/04/2008  . MENISCUS TEAR, RIGHT 12/07/2005    Carey Bullocks, OTR/L 10/17/2016, 12:03 PM  [facility] [address] [place], [place], [address] Phone: [phone]   Fax:  [phone]  Name: [name] MRN: [id number] Date of Birth: 07/18/1950

## 2016-10-19 ENCOUNTER — Ambulatory Visit: Payer: Medicare HMO | Admitting: Occupational Therapy

## 2016-10-19 DIAGNOSIS — M6281 Muscle weakness (generalized): Secondary | ICD-10-CM | POA: Diagnosis not present

## 2016-10-19 DIAGNOSIS — R278 Other lack of coordination: Secondary | ICD-10-CM | POA: Diagnosis not present

## 2016-10-19 DIAGNOSIS — I69354 Hemiplegia and hemiparesis following cerebral infarction affecting left non-dominant side: Secondary | ICD-10-CM

## 2016-10-19 DIAGNOSIS — M25512 Pain in left shoulder: Secondary | ICD-10-CM | POA: Diagnosis not present

## 2016-10-19 DIAGNOSIS — G8929 Other chronic pain: Secondary | ICD-10-CM | POA: Diagnosis not present

## 2016-10-19 NOTE — Patient Instructions (Signed)
SHOULDER: Flexion On Table    Place Lt hand on table, elbows straight. Move hips away from body. Press hand down into table. Hold _3__ seconds. _10__ reps per set, _2_ sets per day. Do NOT let elbow come out to side.  ** Then do big circles counterclockwise with help from Rt hand x 10 reps, 2x/day  Flexion (Assistive)    Hold Lt wrist with Rt hand (Lt thumb side up), and raise arms above head, keeping elbows as straight as possible. Can be done sitting or lying down. Can go higher lying down Repeat __10__ times. Do _2___ sessions per day.  Supination (Passive)    Keep elbow bent at right angle and held firmly at side. Use other hand to turn forearm until palm faces upward. Hold _10___ seconds. Repeat _5___ times. Do _2___ sessions per day.   Extension (Passive)    Using other hand, lift hand at wrist as far as possible. Hold __10__ seconds. Repeat __5__ times. Do __2__ sessions per day. Gently stretch fingers to but do NOT hyperextend big knuckles

## 2016-10-19 NOTE — Therapy (Signed)
[facility] [address] [place], [place], [address] Phone: [phone]   Fax:  [phone]  Occupational Therapy Treatment  Patient Details  Name: [name] MRN: [id number] Date of Birth: 28-Dec-1950 Referring Provider: Dr. [name]   Encounter Date: 10/19/2016      OT End of Session - 10/19/16 1633    Visit Number 4   Number of Visits 9   Date for OT Re-Evaluation 11/02/16   Authorization Type [employer] MCR/MCD - G code needed   Authorization - Visit Number 4   Authorization - Number of Visits 10   OT Start Time 1320   OT Stop Time 1400   OT Time Calculation (min) 40 min   Activity Tolerance Patient tolerated treatment well      Past Medical History:  Diagnosis Date  . Arthritis   . Difficulty sleeping   . Gallstones   . Hyperlipidemia   . Hypertension   . Preoperative clearance   . Spasmodic cough    "IT COMES IN DIFFERENT SEASONS"  . Stroke ([place]) 2015   WEAKNESS L SIDE OF BODY     Past Surgical History:  Procedure Laterality Date  . CHOLECYSTECTOMY N/A 04/12/2015   Procedure: LAPAROSCOPIC CHOLECYSTECTOMY WITH INTRAOPERATIVE CHOLANGIOGRAM;  Surgeon: Greer Pickerel, MD;  Location: [facility];  Service: General;  Laterality: N/A;  . KNEE ARTHROSCOPY  2010    There were no vitals filed for this visit.      Subjective Assessment - 10/19/16 1322    Pertinent History poly-arthritis, Lt hemiplegia from CVA 08/2013, HTN, recent episode of syncope 09/27/16   Patient Stated Goals Be able to stretch my arm better without pain   Currently in Pain? No/denies                      OT Treatments/Exercises (OP) - 10/19/16 0001      Neurological Re-education Exercises   Other Exercises 1 Pt issued HEP for NMR LUE and reviewed proper positioning/technique while performing - see pt instructions for details                OT Education - 10/19/16 1345    Education provided Yes   Education  Details HEP   Person(s) Educated Patient   Methods Explanation;Demonstration;Handout   Comprehension Verbalized understanding;Returned demonstration             OT Long Term Goals - 10/19/16 1634      OT LONG TERM GOAL #1   Title Pt will be independent with updated HEP for LUE   Time 4   Period Weeks   Status Achieved     OT LONG TERM GOAL #2   Title Pt will be independent with splint wear and care   Time 4   Period Weeks   Status Achieved     OT LONG TERM GOAL #3   Title Pt will demo understanding of joint protection techniques, task modifications, and general HEP to decrease joint stiffness and pain associated with arthritis   Time 4   Period Weeks   Status Achieved     OT LONG TERM GOAL #4   Title Pt to verbalize understanding with A/E to increase ease, safety, and independence for cooking tasks   Time 4   Period Weeks   Status Achieved     OT LONG TERM GOAL #5   Title Pt to verbalize understanding with pain reduction strategies for LUE including proper positioning and modalities  prn   Time 4   Period Weeks   Status On-going               Plan - 10/19/16 1634    Clinical Impression Statement Pt now has met LTG #1. Pt progressing towards remaining goal.    Rehab Potential Good   OT Frequency 2x / week   OT Duration 4 weeks   OT Treatment/Interventions Self-care/ADL training;Moist Heat;DME and/or AE instruction;Splinting;Patient/family education;Therapeutic exercises;Ultrasound;Therapeutic activities;Aquatic Therapy;Neuromuscular education;Functional Mobility Training;Passive range of motion;Manual Therapy;Parrafin   Plan address remaining LTG, review all education and d/c next session   Consulted and Agree with Plan of Care Patient      Patient will benefit from skilled therapeutic intervention in order to improve the following deficits and impairments:  Decreased range of motion, Increased edema, Decreased safety awareness, Impaired tone, Impaired UE  functional use, Pain, Decreased knowledge of use of DME, Decreased mobility, Decreased strength  Visit Diagnosis: Hemiplegia and hemiparesis following cerebral infarction affecting left non-dominant side [facility])    Problem List Patient Active Problem List   Diagnosis Date Noted  . Chronic cholecystitis 04/12/2015  . Preoperative clearance 03/17/2015  . Spastic hemiplegia affecting nondominant side ([place]) 10/27/2013  . Adhesive capsulitis of right shoulder 10/27/2013  . Chronic diastolic heart failure ([place]) 08/26/2013  . Dyslipidemia 08/26/2013  . Borderline diabetes 08/26/2013  . CVA (cerebral infarction) 08/26/2013  . Acute CVA (cerebrovascular accident) ([place]) 08/23/2013  . Pre-syncope 08/17/2012  . Hypokalemia 08/17/2012  . ARTHRITIS 03/28/2010  . DE QUERVAIN'S TENOSYNOVITIS 03/28/2010  . Normocytic anemia 07/26/2009  . Asthma 03/09/2009  . DENTAL CARIES 03/09/2009  . CANDIDIASIS OF VULVA AND VAGINA 06/04/2008  . SKIN TAG 06/04/2008  . ARTHRITIS, KNEES, BILATERAL 05/22/2008  . HYPERTENSION, BENIGN ESSENTIAL 05/04/2008  . KNEE PAIN, BILATERAL 05/04/2008  . COUGH 05/04/2008  . MENISCUS TEAR, RIGHT 12/07/2005    Carey Bullocks, OTR/L 10/19/2016, 4:35 PM  [facility] [address] [place], [place], [address] Phone: [phone]   Fax:  [phone]  Name: RHIAN ASEBEDO MRN: [id number] Date of Birth: 1950-12-21

## 2016-10-24 ENCOUNTER — Ambulatory Visit: Payer: Medicare HMO | Admitting: Occupational Therapy

## 2016-10-24 DIAGNOSIS — I69354 Hemiplegia and hemiparesis following cerebral infarction affecting left non-dominant side: Secondary | ICD-10-CM

## 2016-10-24 DIAGNOSIS — M6281 Muscle weakness (generalized): Secondary | ICD-10-CM | POA: Diagnosis not present

## 2016-10-24 DIAGNOSIS — G8929 Other chronic pain: Secondary | ICD-10-CM

## 2016-10-24 DIAGNOSIS — M25512 Pain in left shoulder: Secondary | ICD-10-CM

## 2016-10-24 DIAGNOSIS — R278 Other lack of coordination: Secondary | ICD-10-CM | POA: Diagnosis not present

## 2016-10-24 NOTE — Therapy (Signed)
[facility] [address] [place], [place], [address] Phone: [phone]   Fax:  [phone]  Occupational Therapy Treatment  Patient Details  Name: [name] MRN: [id number] Date of Birth: May 28, 1950 Referring Provider: Dr. [name]   Encounter Date: 10/24/2016      OT End of Session - 10/24/16 1112    Visit Number 5   Number of Visits 9   Date for OT Re-Evaluation 11/02/16   Authorization Type [employer] MCR/MCD - G code needed   Authorization - Visit Number 5   Authorization - Number of Visits 10   OT Start Time 3785   OT Stop Time 1100   OT Time Calculation (min) 45 min   Activity Tolerance Patient tolerated treatment well      Past Medical History:  Diagnosis Date  . Arthritis   . Difficulty sleeping   . Gallstones   . Hyperlipidemia   . Hypertension   . Preoperative clearance   . Spasmodic cough    "IT COMES IN DIFFERENT SEASONS"  . Stroke ([facility]) 2015   WEAKNESS L SIDE OF BODY     Past Surgical History:  Procedure Laterality Date  . CHOLECYSTECTOMY N/A 04/12/2015   Procedure: LAPAROSCOPIC CHOLECYSTECTOMY WITH INTRAOPERATIVE CHOLANGIOGRAM;  Surgeon: Greer Pickerel, MD;  Location: [facility];  Service: General;  Laterality: N/A;  . KNEE ARTHROSCOPY  2010    There were no vitals filed for this visit.      Subjective Assessment - 10/24/16 1021    Subjective  I don't have anymore questions. Thank you for all the information   Pertinent History poly-arthritis, Lt hemiplegia from CVA 08/2013, HTN, recent episode of syncope 09/27/16   Patient Stated Goals Be able to stretch my arm better without pain   Currently in Pain? Yes   Pain Score 3    Pain Location Hand   Pain Orientation Right   Pain Descriptors / Indicators Sore   Pain Type Acute pain   Pain Onset Yesterday   Pain Frequency Rarely  in Rt hand   Aggravating Factors  cold   Pain Relieving Factors heat                      OT  Treatments/Exercises (OP) - 10/24/16 0001      ADLs   ADL Comments Discussed proper positioning of LUE for sleeping and correct positioning while performing HEP. Also discussed other pain management strategies including review of joint protection techniques and task modifications, and proper use of heat/cold. Pt also issued list of Silver [employer]ne[employer] fitness programs upon pt request. Reviewed all goals and progress                OT Education - 10/24/16 1110    Education provided Yes   Education Details pain management strategies, bed positioning, correct technique for stretches of LUE, proper use of heat/cold modalities, Silver Sneakers option   Person(s) Educated Patient   Methods Explanation  handout issued for bed positioning and Silver Sneakers   Comprehension Verbalized understanding             OT Long Term Goals - 10/24/16 1112      OT LONG TERM GOAL #1   Title Pt will be independent with updated HEP for LUE   Time 4   Period Weeks   Status Achieved     OT LONG TERM GOAL #2   Title Pt will be independent with splint wear and  care   Time 4   Period Weeks   Status Achieved     OT LONG TERM GOAL #3   Title Pt will demo understanding of joint protection techniques, task modifications, and general HEP to decrease joint stiffness and pain associated with arthritis   Time 4   Period Weeks   Status Achieved     OT LONG TERM GOAL #4   Title Pt to verbalize understanding with A/E to increase ease, safety, and independence for cooking tasks   Time 4   Period Weeks   Status Achieved     OT LONG TERM GOAL #5   Title Pt to verbalize understanding with pain reduction strategies for LUE including proper positioning and modalities prn   Time 4   Period Weeks   Status Achieved               Plan - 10/24/16 1112    Clinical Impression Statement Pt has met all LTG's at this time.    Rehab Potential Good   OT Treatment/Interventions Self-care/ADL  training;Moist Heat;DME and/or AE instruction;Splinting;Patient/family education;Therapeutic exercises;Ultrasound;Therapeutic activities;Aquatic Therapy;Neuromuscular education;Functional Mobility Training;Passive range of motion;Manual Therapy;Parrafin   Plan d/c O.T.    Consulted and Agree with Plan of Care Patient      Patient will benefit from skilled therapeutic intervention in order to improve the following deficits and impairments:  Decreased range of motion, Increased edema, Decreased safety awareness, Impaired tone, Impaired UE functional use, Pain, Decreased knowledge of use of DME, Decreased mobility, Decreased strength  Visit Diagnosis: Hemiplegia and hemiparesis following cerebral infarction affecting left non-dominant side ([facility])  Chronic left shoulder pain    Problem List Patient Active Problem List   Diagnosis Date Noted  . Chronic cholecystitis 04/12/2015  . Preoperative clearance 03/17/2015  . Spastic hemiplegia affecting nondominant side ([facility]) 10/27/2013  . Adhesive capsulitis of right shoulder 10/27/2013  . Chronic diastolic heart failure ([facility]) 08/26/2013  . Dyslipidemia 08/26/2013  . Borderline diabetes 08/26/2013  . CVA (cerebral infarction) 08/26/2013  . Acute CVA (cerebrovascular accident) ([place]) 08/23/2013  . Pre-syncope 08/17/2012  . Hypokalemia 08/17/2012  . ARTHRITIS 03/28/2010  . DE QUERVAIN'S TENOSYNOVITIS 03/28/2010  . Normocytic anemia 07/26/2009  . Asthma 03/09/2009  . DENTAL CARIES 03/09/2009  . CANDIDIASIS OF VULVA AND VAGINA 06/04/2008  . SKIN TAG 06/04/2008  . ARTHRITIS, KNEES, BILATERAL 05/22/2008  . HYPERTENSION, BENIGN ESSENTIAL 05/04/2008  . KNEE PAIN, BILATERAL 05/04/2008  . COUGH 05/04/2008  . MENISCUS TEAR, RIGHT 12/07/2005    OCCUPATIONAL THERAPY DISCHARGE SUMMARY  Visits from Start of Care: 5  Current functional level related to goals / functional outcomes: SEE ABOVE - PT MET ALL LTG's   Remaining deficits: Lt  hemiplegia Generalized pain due to [employer] / Equipment: HEP, A/E recommendations, task modifications, joint protection techniques, pain management strategies, Silver Sneakers options  Plan: Patient agrees to discharge.  Patient goals were met. Patient is being discharged due to meeting the stated rehab goals.  ?????        Carey Bullocks, OTR/L 10/24/2016, 11:13 AM  [facility] [address] [place], [place], [address] Phone: [phone]   Fax:  [phone]  Name: [name] MRN: [id number] Date of Birth: 29-Aug-1950

## 2016-10-26 ENCOUNTER — Encounter: Payer: Medicare HMO | Admitting: Occupational Therapy

## 2016-10-31 ENCOUNTER — Encounter: Payer: Self-pay | Admitting: Allergy and Immunology

## 2016-10-31 ENCOUNTER — Encounter: Payer: Medicare HMO | Admitting: Occupational Therapy

## 2016-10-31 ENCOUNTER — Ambulatory Visit (INDEPENDENT_AMBULATORY_CARE_PROVIDER_SITE_OTHER): Payer: Medicare HMO | Admitting: Allergy and Immunology

## 2016-10-31 VITALS — BP 148/70 | HR 72 | Resp 20 | Ht 63.5 in | Wt 237.0 lb

## 2016-10-31 DIAGNOSIS — J310 Chronic rhinitis: Secondary | ICD-10-CM | POA: Diagnosis not present

## 2016-10-31 DIAGNOSIS — R053 Chronic cough: Secondary | ICD-10-CM

## 2016-10-31 DIAGNOSIS — K219 Gastro-esophageal reflux disease without esophagitis: Secondary | ICD-10-CM

## 2016-10-31 DIAGNOSIS — R05 Cough: Secondary | ICD-10-CM | POA: Diagnosis not present

## 2016-10-31 DIAGNOSIS — J4540 Moderate persistent asthma, uncomplicated: Secondary | ICD-10-CM | POA: Diagnosis not present

## 2016-10-31 MED ORDER — AZELASTINE HCL 0.15 % NA SOLN: NASAL | 5 refills | Status: DC

## 2016-10-31 MED ORDER — BUDESONIDE-FORMOTEROL FUMARATE 160-4.5 MCG/ACT IN AERO: 2.0000 | INHALATION_SPRAY | Freq: Two times a day (BID) | RESPIRATORY_TRACT | 5 refills | Status: DC

## 2016-10-31 MED ORDER — FLUTTER DEVI: 0 refills | Status: AC

## 2016-10-31 MED ORDER — DEXLANSOPRAZOLE 30 MG PO CPDR: 30.0000 mg | DELAYED_RELEASE_CAPSULE | Freq: Every day | ORAL | 5 refills | Status: DC

## 2016-10-31 MED ORDER — RANITIDINE HCL 300 MG PO CAPS: 300.0000 mg | ORAL_CAPSULE | Freq: Every evening | ORAL | 5 refills | Status: DC

## 2016-10-31 MED ORDER — ALBUTEROL SULFATE HFA 108 (90 BASE) MCG/ACT IN AERS: 2.0000 | INHALATION_SPRAY | Freq: Four times a day (QID) | RESPIRATORY_TRACT | 2 refills | Status: DC | PRN

## 2016-10-31 NOTE — Progress Notes (Signed)
New Patient Note  RE: [name] MRN: [id number] DOB: [birth year] Date of Office Visit: 10/31/2016  Referring provider: [name], MD Primary care provider: [name], MD  Chief Complaint: Cough   History of present illness: [name] is a 66 y.o. female seen today in consultation requested by [name], MD.  She reports that she has had a persistent cough for many years.  The cough seemed to progress after a CVA in 2015.  She believes that the cough originates as a tickle in the base of her throat and sometimes "it feels like something is stuck" in her throat.  At times, she has coughed so hard as to cause stress incontinence.  The cough improved to some degree after she started taking Protonix.  In addition, she states that the cough gets worse after large meals and with spicy food or chocolate.  She is not on an ACE inhibitor. The cough occurs throughout the day and night and occasionally awakens her from sleep.  She has no formal diagnosis of asthma, however she does complain of occasional dyspnea and wheezing had been prescirbed Anoro Ellipta in the past, though she is currently not taking this medication.  She is a former cigarette smoker having quit in the late 1990s.  She had a negative chest x-ray in August 2018 and a negative barium swallow study in June 2018.  She complains of clear rhinorrhea and occasional watery eyes, however denies postnasal drainage. No significant seasonal symptom variation has been noted nor have specific environmental triggers been identified.   Assessment and plan: Cough, persistent The most common causes of chronic cough include the following: upper airway cough syndrome (UACS) which is caused by variety of rhinosinus conditions; asthma; gastroesophageal reflux disease (GERD); chronic bronchitis from cigarette smoking or other inhaled environmental irritants; non-asthmatic eosinophilic bronchitis; and bronchiectasis. In prospective studies, these  conditions have accounted for up to 94% of the causes of chronic cough in immunocompetent adults. The history and physical examination suggest that her cough is multifactorial with contribution from acid reflux and bronchial hyperresponsiveness. We will address these issues at this time.   A prescription has been provided for a flutter valve to be used as needed to break the coughing cycle.  Treatment plan as outlined below.    We will regroup in 2 months to assess treatment response and adjust therapy accordingly.  Moderate persistent asthma Today's spirometry results, assessed while asymptomatic, suggest under-perception of bronchoconstriction.  A prescription has been provided for Symbicort (budesonide/formoterol) 160/4.5 g,  2 inhalations twice a day. To maximize pulmonary deposition, a spacer has been provided along with instructions for its proper administration with an HFA inhaler.  A prescription has been provided for albuterol HFA, 1-2 inhalations every 4-6 hours as needed.  Subjective and objective measures of pulmonary function will be followed and the treatment plan will be adjusted accordingly.  GERD (gastroesophageal reflux disease)  A prescription has been provided for Dexilant 30 mg daily half hour before meals.  Discontinue Protonix.  A prescription has been provided for ranitidine 300 mg daily at bedtime.  Appropriate reflux last all modifications have been provided in written form and discussed.  Chronic rhinitis Non-allergic rhinitis.  All seasonal and perennial aeroallergen skin tests are negative despite a positive histamine control.  Intranasal steroids and intranasal antihistamines are effective for symptoms associated with non-allergic rhinitis, whereas second generation antihistamines such as cetirizine, loratadine and fexofenadine have been found to be ineffective for this condition.  A prescription has been provided for azelastine nasal spray, one spray per  nostril 1-2 times daily as needed. Proper nasal spray technique has been discussed and demonstrated.  I have also recommended nasal saline spray (i.e., Simply Saline) or nasal saline lavage (i.e., NeilMed) as needed and prior to medicated nasal sprays.   Meds ordered this encounter  Medications  . Respiratory Therapy Supplies (FLUTTER) DEVI    Sig: Use as directed    Dispense:  1 each    Refill:  0  . budesonide-formoterol (SYMBICORT) 160-4.5 MCG/ACT inhaler    Sig: Inhale 2 puffs into the lungs 2 (two) times daily.    Dispense:  1 Inhaler    Refill:  5    With spacer  . albuterol (PROVENTIL HFA;VENTOLIN HFA) 108 (90 Base) MCG/ACT inhaler    Sig: Inhale 2 puffs into the lungs every 6 (six) hours as needed for wheezing or shortness of breath.    Dispense:  1 Inhaler    Refill:  2  . Dexlansoprazole 30 MG capsule    Sig: Take 1 capsule (30 mg total) by mouth daily.    Dispense:  30 capsule    Refill:  5  . ranitidine (ZANTAC) 300 MG capsule    Sig: Take 1 capsule (300 mg total) by mouth every evening.    Dispense:  30 capsule    Refill:  5  . Azelastine HCl 0.15 % SOLN    Sig: 1 spray in each nostril 1-2 times daily as needed.    Dispense:  30 mL    Refill:  5    Diagnostics: Spirometry: FVC was 1.16 L and FEV1 was 0.93 L (48% predicted) with 16% postbronchodilator improvement.  This study was performed while the patient was asymptomatic.  Please see scanned spirometry results for details. Environmental skin testing:  Negative despite a positive histamine control.   Physical examination: Blood pressure (!) 148/70, pulse 72, resp. rate 20, height 5' 3.5" (1.613 m), weight 237 lb (107.5 kg), SpO2 95 %.  General: Alert, interactive, in no acute distress. HEENT: TMs pearly gray, turbinates mildly edematous without discharge, post-pharynx unremarkable. Neck: Supple without lymphadenopathy. Lungs: Decreased breath sounds bilaterally without wheezing, rhonchi or rales. CV: Normal  S1, S2 without murmurs. Abdomen: Nondistended, nontender. Skin: Warm and dry, without lesions or rashes. Extremities:  No clubbing, cyanosis or edema. Neuro:   Grossly intact.  Review of systems:  Review of systems negative except as noted in HPI / PMHx or noted below: Review of Systems  Constitutional: Negative.   HENT: Negative.   Eyes: Negative.   Respiratory: Negative.   Cardiovascular: Negative.   Gastrointestinal: Negative.   Genitourinary: Negative.   Musculoskeletal: Negative.   Skin: Negative.   Neurological: Negative.   Endo/Heme/Allergies: Negative.   Psychiatric/Behavioral: Negative.     Past medical history:  Past Medical History:  Diagnosis Date  . Arthritis   . Asthma   . Difficulty sleeping   . Gallstones   . Hyperlipidemia   . Hypertension   . Preoperative clearance   . Spasmodic cough    "IT COMES IN DIFFERENT SEASONS"  . Stroke ([place]) 2015   WEAKNESS L SIDE OF BODY     Past surgical history:  Past Surgical History:  Procedure Laterality Date  . CHOLECYSTECTOMY N/A 04/12/2015   Procedure: LAPAROSCOPIC CHOLECYSTECTOMY WITH INTRAOPERATIVE CHOLANGIOGRAM;  Surgeon: Greer Pickerel, MD;  Location: [facility];  Service: General;  Laterality: N/A;  . KNEE ARTHROSCOPY  2010    Family  history: Family History  Problem Relation Age of Onset  . Diabetes Mother   . Stomach cancer Father     Social history: Social History   Social History  . Marital status: Single    Spouse name: N/A  . Number of children: N/A  . Years of education: N/A   Occupational History  . Not on file.   Social History Main Topics  . Smoking status: Former Smoker    Quit date: 04/08/1994  . Smokeless tobacco: Never Used  . Alcohol use No  . Drug use: No  . Sexual activity: Not on file   Other Topics Concern  . Not on file   Social History Narrative  . No narrative on file   Environmental History: The patient lives in a 67 year old house with tiled floors throughout and  central air/heat.  There is no known mold/water damage in the home.  She has no pets.  She is a former cigarette smoker having quit in 1996.  Allergies as of 10/31/2016   No Known Allergies     Medication List       Accurate as of 10/31/16  7:12 PM. Always use your most recent med list.          albuterol 108 (90 Base) MCG/ACT inhaler Commonly known as:  PROVENTIL HFA;VENTOLIN HFA Inhale 2 puffs into the lungs every 6 (six) hours as needed for wheezing or shortness of breath.   atorvastatin 40 MG tablet Commonly known as:  LIPITOR Take 1 tablet (40 mg total) by mouth daily at 6 PM.   Azelastine HCl 0.15 % Soln 1 spray in each nostril 1-2 times daily as needed.   budesonide-formoterol 160-4.5 MCG/ACT inhaler Commonly known as:  SYMBICORT Inhale 2 puffs into the lungs 2 (two) times daily.   Dexlansoprazole 30 MG capsule Take 1 capsule (30 mg total) by mouth daily.   FLUTTER Devi Use as directed   losartan-hydrochlorothiazide 50-12.5 MG tablet Commonly known as:  HYZAAR Take 1 tablet by mouth at bedtime.   methocarbamol 500 MG tablet Commonly known as:  ROBAXIN Take 1 tablet (500 mg total) by mouth 2 (two) times daily.   metoCLOPramide 5 MG tablet Commonly known as:  REGLAN Take 1 tablet (5 mg total) by mouth every 8 (eight) hours as needed for nausea.   naproxen 500 MG tablet Commonly known as:  NAPROSYN Take 1 tablet (500 mg total) by mouth 2 (two) times daily with a meal.   pantoprazole 20 MG tablet Commonly known as:  PROTONIX Take 20 mg by mouth daily.   ranitidine 300 MG capsule Commonly known as:  ZANTAC Take 1 capsule (300 mg total) by mouth every evening.            Discharge Care Instructions        Start     Ordered   10/31/16 0000  Spirometry with Graph    Question Answer Comment  Where should this test be performed? Other   Basic spirometry No   Spirometry pre & post bronchodilator Yes      10/31/16 1720   10/31/16 0000  Allergy Test     Question:  Allergy test to perform  Answer:  1-59   10/31/16 1720   10/31/16 0000  Respiratory Therapy Supplies (FLUTTER) DEVI     10/31/16 1720   10/31/16 0000  budesonide-formoterol (SYMBICORT) 160-4.5 MCG/ACT inhaler  2 times daily    Comments:  With spacer   10/31/16 1720   10/31/16 0000  albuterol (PROVENTIL HFA;VENTOLIN HFA) 108 (90 Base) MCG/ACT inhaler  Every 6 hours PRN     10/31/16 1720   10/31/16 0000  Dexlansoprazole 30 MG capsule  Daily     10/31/16 1720   10/31/16 0000  ranitidine (ZANTAC) 300 MG capsule  Every evening     10/31/16 1720   10/31/16 0000  Azelastine HCl 0.15 % SOLN     10/31/16 1720      Known medication allergies: No Known Allergies  I appreciate the opportunity to take part in Jersi's care. Please do not hesitate to contact me with questions.  Sincerely,   R. Edgar Frisk, MD

## 2016-10-31 NOTE — Assessment & Plan Note (Signed)
The most common causes of chronic cough include the following: upper airway cough syndrome (UACS) which is caused by variety of rhinosinus conditions; asthma; gastroesophageal reflux disease (GERD); chronic bronchitis from cigarette smoking or other inhaled environmental irritants; non-asthmatic eosinophilic bronchitis; and bronchiectasis. In prospective studies, these conditions have accounted for up to 94% of the causes of chronic cough in immunocompetent adults. The history and physical examination suggest that her cough is multifactorial with contribution from acid reflux and bronchial hyperresponsiveness. We will address these issues at this time.   A prescription has been provided for a flutter valve to be used as needed to break the coughing cycle.  Treatment plan as outlined below.    We will regroup in 2 months to assess treatment response and adjust therapy accordingly.

## 2016-10-31 NOTE — Assessment & Plan Note (Addendum)
Non-allergic rhinitis.  All seasonal and perennial aeroallergen skin tests are negative despite a positive histamine control.  Intranasal steroids and intranasal antihistamines are effective for symptoms associated with non-allergic rhinitis, whereas second generation antihistamines such as cetirizine, loratadine and fexofenadine have been found to be ineffective for this condition.  A prescription has been provided for azelastine nasal spray, one spray per nostril 1-2 times daily as needed. Proper nasal spray technique has been discussed and demonstrated.  I have also recommended nasal saline spray (i.e., Simply Saline) or nasal saline lavage (i.e., NeilMed) as needed and prior to medicated nasal sprays.

## 2016-10-31 NOTE — Patient Instructions (Addendum)
Cough, persistent The most common causes of chronic cough include the following: upper airway cough syndrome (UACS) which is caused by variety of rhinosinus conditions; asthma; gastroesophageal reflux disease (GERD); chronic bronchitis from cigarette smoking or other inhaled environmental irritants; non-asthmatic eosinophilic bronchitis; and bronchiectasis. In prospective studies, these conditions have accounted for up to 94% of the causes of chronic cough in immunocompetent adults. The history and physical examination suggest that her cough is multifactorial with contribution from acid reflux and bronchial hyperresponsiveness. We will address these issues at this time.   A prescription has been provided for a flutter valve to be used as needed to break the coughing cycle.  Treatment plan as outlined below.    We will regroup in 2 months to assess treatment response and adjust therapy accordingly.  Moderate persistent asthma Today's spirometry results, assessed while asymptomatic, suggest under-perception of bronchoconstriction.  A prescription has been provided for Symbicort (budesonide/formoterol) 160/4.5 g,  2 inhalations twice a day. To maximize pulmonary deposition, a spacer has been provided along with instructions for its proper administration with an HFA inhaler.  A prescription has been provided for albuterol HFA, 1-2 inhalations every 4-6 hours as needed.  Subjective and objective measures of pulmonary function will be followed and the treatment plan will be adjusted accordingly.  GERD (gastroesophageal reflux disease)  A prescription has been provided for Dexilant 30 mg daily half hour before meals.  Discontinue Protonix.  A prescription has been provided for ranitidine 300 mg daily at bedtime.  Appropriate reflux last all modifications have been provided in written form and discussed.  Chronic rhinitis Non-allergic rhinitis.  All seasonal and perennial aeroallergen skin tests  are negative despite a positive histamine control.  Intranasal steroids and intranasal antihistamines are effective for symptoms associated with non-allergic rhinitis, whereas second generation antihistamines such as cetirizine, loratadine and fexofenadine have been found to be ineffective for this condition.  A prescription has been provided for azelastine nasal spray, one spray per nostril 1-2 times daily as needed. Proper nasal spray technique has been discussed and demonstrated.  I have also recommended nasal saline spray (i.e., Simply Saline) or nasal saline lavage (i.e., NeilMed) as needed and prior to medicated nasal sprays.   Return in about 2 months (around 12/31/2016), or if symptoms worsen or fail to improve.

## 2016-10-31 NOTE — Assessment & Plan Note (Signed)
   A prescription has been provided for Dexilant 30 mg daily half hour before meals.  Discontinue Protonix.  A prescription has been provided for ranitidine 300 mg daily at bedtime.  Appropriate reflux last all modifications have been provided in written form and discussed.

## 2016-10-31 NOTE — Assessment & Plan Note (Addendum)
Today's spirometry results, assessed while asymptomatic, suggest under-perception of bronchoconstriction.  A prescription has been provided for Symbicort (budesonide/formoterol) 160/4.5 g, 2 inhalations twice a day. To maximize pulmonary deposition, a spacer has been provided along with instructions for its proper administration with an HFA inhaler.  A prescription has been provided for albuterol HFA, 1-2 inhalations every 4-6 hours as needed.  Subjective and objective measures of pulmonary function will be followed and the treatment plan will be adjusted accordingly.

## 2016-11-02 ENCOUNTER — Encounter: Payer: Medicare HMO | Admitting: Occupational Therapy

## 2016-11-07 ENCOUNTER — Other Ambulatory Visit: Payer: Self-pay | Admitting: Family Medicine

## 2016-11-07 DIAGNOSIS — Z1231 Encounter for screening mammogram for malignant neoplasm of breast: Secondary | ICD-10-CM

## 2016-11-18 ENCOUNTER — Encounter (HOSPITAL_COMMUNITY): Payer: Self-pay | Admitting: Emergency Medicine

## 2016-11-18 ENCOUNTER — Emergency Department (HOSPITAL_COMMUNITY): Payer: No Typology Code available for payment source

## 2016-11-18 ENCOUNTER — Emergency Department (HOSPITAL_COMMUNITY)
Admission: EM | Admit: 2016-11-18 | Discharge: 2016-11-18 | Disposition: A | Discharge status: Home / Self Care | Payer: No Typology Code available for payment source | Attending: Emergency Medicine | Admitting: Emergency Medicine

## 2016-11-18 DIAGNOSIS — M7989 Other specified soft tissue disorders: Secondary | ICD-10-CM | POA: Diagnosis not present

## 2016-11-18 DIAGNOSIS — Z87891 Personal history of nicotine dependence: Secondary | ICD-10-CM | POA: Insufficient documentation

## 2016-11-18 DIAGNOSIS — R0789 Other chest pain: Secondary | ICD-10-CM | POA: Diagnosis not present

## 2016-11-18 DIAGNOSIS — M79642 Pain in left hand: Secondary | ICD-10-CM

## 2016-11-18 DIAGNOSIS — Z79899 Other long term (current) drug therapy: Secondary | ICD-10-CM | POA: Diagnosis not present

## 2016-11-18 DIAGNOSIS — M25562 Pain in left knee: Secondary | ICD-10-CM | POA: Diagnosis not present

## 2016-11-18 DIAGNOSIS — I110 Hypertensive heart disease with heart failure: Secondary | ICD-10-CM | POA: Diagnosis not present

## 2016-11-18 DIAGNOSIS — S299XXA Unspecified injury of thorax, initial encounter: Secondary | ICD-10-CM | POA: Diagnosis not present

## 2016-11-18 DIAGNOSIS — I5032 Chronic diastolic (congestive) heart failure: Secondary | ICD-10-CM | POA: Diagnosis not present

## 2016-11-18 DIAGNOSIS — M25561 Pain in right knee: Secondary | ICD-10-CM

## 2016-11-18 DIAGNOSIS — S8991XA Unspecified injury of right lower leg, initial encounter: Secondary | ICD-10-CM | POA: Diagnosis not present

## 2016-11-18 MED ORDER — OXYCODONE-ACETAMINOPHEN 5-325 MG PO TABS
1.0000 | ORAL_TABLET | Freq: Once | ORAL | Status: AC
  Administered 2016-11-18: 1 via ORAL
  Filled 2016-11-18: qty 1
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
## 2016-11-18 NOTE — ED Notes (Signed)
Patient transported to X-ray 

## 2016-11-18 NOTE — Discharge Instructions (Addendum)
Please follow-up with your primary care provider as needed.  Take Tylenol and/or Motrin as needed for pain.
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
## 2016-11-28 ENCOUNTER — Ambulatory Visit
Admission: RE | Admit: 2016-11-28 | Discharge: 2016-11-28 | Disposition: A | Discharge status: Home / Self Care | Payer: Medicare HMO | Source: Ambulatory Visit | Attending: Family Medicine | Admitting: Family Medicine

## 2016-11-28 DIAGNOSIS — Z1231 Encounter for screening mammogram for malignant neoplasm of breast: Secondary | ICD-10-CM

## 2016-11-29 ENCOUNTER — Other Ambulatory Visit: Payer: Self-pay | Admitting: Family Medicine

## 2016-11-29 DIAGNOSIS — R928 Other abnormal and inconclusive findings on diagnostic imaging of breast: Secondary | ICD-10-CM

## 2016-12-04 ENCOUNTER — Ambulatory Visit
Admission: RE | Admit: 2016-12-04 | Discharge: 2016-12-04 | Disposition: A | Discharge status: Home / Self Care | Payer: Medicare HMO | Source: Ambulatory Visit | Attending: Family Medicine | Admitting: Family Medicine

## 2016-12-04 DIAGNOSIS — R928 Other abnormal and inconclusive findings on diagnostic imaging of breast: Secondary | ICD-10-CM | POA: Diagnosis not present

## 2016-12-04 DIAGNOSIS — N6489 Other specified disorders of breast: Secondary | ICD-10-CM | POA: Diagnosis not present

## 2016-12-18 ENCOUNTER — Ambulatory Visit: Payer: Medicare HMO | Admitting: Allergy and Immunology

## 2016-12-25 ENCOUNTER — Ambulatory Visit: Payer: Medicare HMO | Admitting: Allergy and Immunology

## 2017-02-13 ENCOUNTER — Emergency Department (HOSPITAL_COMMUNITY)
Admission: EM | Admit: 2017-02-13 | Discharge: 2017-02-13 | Disposition: A | Discharge status: Home / Self Care | Payer: Medicare HMO | Attending: Emergency Medicine | Admitting: Emergency Medicine

## 2017-02-13 ENCOUNTER — Encounter (HOSPITAL_COMMUNITY): Payer: Self-pay | Admitting: Emergency Medicine

## 2017-02-13 DIAGNOSIS — Z87891 Personal history of nicotine dependence: Secondary | ICD-10-CM | POA: Diagnosis not present

## 2017-02-13 DIAGNOSIS — J45909 Unspecified asthma, uncomplicated: Secondary | ICD-10-CM | POA: Insufficient documentation

## 2017-02-13 DIAGNOSIS — H9201 Otalgia, right ear: Secondary | ICD-10-CM | POA: Diagnosis not present

## 2017-02-13 DIAGNOSIS — R51 Headache: Secondary | ICD-10-CM | POA: Insufficient documentation

## 2017-02-13 DIAGNOSIS — Z8673 Personal history of transient ischemic attack (TIA), and cerebral infarction without residual deficits: Secondary | ICD-10-CM | POA: Diagnosis not present

## 2017-02-13 DIAGNOSIS — Z79899 Other long term (current) drug therapy: Secondary | ICD-10-CM | POA: Insufficient documentation

## 2017-02-13 DIAGNOSIS — I5032 Chronic diastolic (congestive) heart failure: Secondary | ICD-10-CM | POA: Diagnosis not present

## 2017-02-13 DIAGNOSIS — R519 Headache, unspecified: Secondary | ICD-10-CM

## 2017-02-13 DIAGNOSIS — I110 Hypertensive heart disease with heart failure: Secondary | ICD-10-CM | POA: Diagnosis not present

## 2017-02-13 MED ORDER — AMOXICILLIN 500 MG PO CAPS: 500.0000 mg | ORAL_CAPSULE | Freq: Three times a day (TID) | ORAL | 0 refills | Status: DC

## 2017-02-13 MED ORDER — OXYCODONE-ACETAMINOPHEN 5-325 MG PO TABS: 2.0000 | ORAL_TABLET | ORAL | 0 refills | Status: DC | PRN

## 2017-02-13 NOTE — Discharge Instructions (Signed)
See Dr. Criss Rosales for recheck in 2-3 days.  You may have a condition known as trigeminal neuralgia.   Your Physician will need to reexam you to make sure that you do not develop a rash at the site called shingles.  You have a small amount of fluid behind your eardrum.  I will treat you with antibiotics and pain medication to see if this resolves your symptoms

## 2017-02-13 NOTE — ED Triage Notes (Signed)
Patient reports right ear ache extending to right lateral face onset yesterday , denies injury , no drainage or hearing loss .

## 2017-02-13 NOTE — ED Provider Notes (Signed)
[facility] EMERGENCY DEPARTMENT Provider Note   CSN: [id number] Arrival date & time: 02/13/17  0543     History   Chief Complaint Chief Complaint  Patient presents with  . Otalgia  . Right Facial Pain    HPI [name] is a 67 y.o. female.  The history is provided by the patient. No language interpreter was used.  Otalgia  This is a new problem. The current episode started yesterday. There is pain in the right ear. The problem occurs constantly. The problem has not changed since onset.There has been no fever. The pain is moderate. Pertinent negatives include no headaches. Her past medical history does not include chronic ear infection.   Pt complains of pain in the right side of her face.  Pt reports she has a shooting pain in her ear.  Pt reports pain comes and goes.  Pain is sharp when it happens Past Medical History:  Diagnosis Date  . Arthritis   . Asthma   . Difficulty sleeping   . Gallstones   . Hyperlipidemia   . Hypertension   . Preoperative clearance   . Spasmodic cough    "IT COMES IN DIFFERENT SEASONS"  . Stroke ([place]) 2015   WEAKNESS L SIDE OF BODY     Patient Active Problem List   Diagnosis Date Noted  . GERD (gastroesophageal reflux disease) 10/31/2016  . Chronic rhinitis 10/31/2016  . Chronic cholecystitis 04/12/2015  . Preoperative clearance 03/17/2015  . Spastic hemiplegia affecting nondominant side ([place]) 10/27/2013  . Adhesive capsulitis of right shoulder 10/27/2013  . Chronic diastolic heart failure ([place]) 08/26/2013  . Dyslipidemia 08/26/2013  . Borderline diabetes 08/26/2013  . CVA (cerebral infarction) 08/26/2013  . Acute CVA (cerebrovascular accident) ([place] ) 08/23/2013  . Pre-syncope 08/17/2012  . Hypokalemia 08/17/2012  . ARTHRITIS 03/28/2010  . DE QUERVAIN'S TENOSYNOVITIS 03/28/2010  . Normocytic anemia 07/26/2009  . Moderate persistent asthma 03/09/2009  . DENTAL CARIES 03/09/2009  . CANDIDIASIS OF VULVA AND VAGINA  06/04/2008  . SKIN TAG 06/04/2008  . ARTHRITIS, KNEES, BILATERAL 05/22/2008  . HYPERTENSION, BENIGN ESSENTIAL 05/04/2008  . KNEE PAIN, BILATERAL 05/04/2008  . Cough, persistent 05/04/2008  . MENISCUS TEAR, RIGHT 12/07/2005    Past Surgical History:  Procedure Laterality Date  . CHOLECYSTECTOMY N/A 04/12/2015   Procedure: LAPAROSCOPIC CHOLECYSTECTOMY WITH INTRAOPERATIVE CHOLANGIOGRAM;  Surgeon: Greer Pickerel, MD;  Location: [facility];  Service: General;  Laterality: N/A;  . KNEE ARTHROSCOPY  2010    OB History    No data available       Home Medications    Prior to Admission medications   Medication Sig Start Date End Date Taking? Authorizing Provider  albuterol (PROVENTIL HFA;VENTOLIN HFA) 108 (90 Base) MCG/ACT inhaler Inhale 2 puffs into the lungs every 6 (six) hours as needed for wheezing or shortness of breath. 10/31/16   Bobbitt, Sedalia Muta, MD  amoxicillin (AMOXIL) 500 MG capsule Take 1 capsule (500 mg total) by mouth 3 (three) times daily. 02/13/17   Fransico Meadow, PA-C  atorvastatin (LIPITOR) 40 MG tablet Take 1 tablet (40 mg total) by mouth daily at 6 PM. Patient taking differently: Take 40 mg by mouth every evening.  09/18/13   Angiulli, Lavon Paganini, PA-C  Azelastine HCl 0.15 % SOLN 1 spray in each nostril 1-2 times daily as needed. Patient taking differently: Place 1 spray into both nostrils daily as needed (congestion).  10/31/16   Bobbitt, Sedalia Muta, MD  budesonide-formoterol [facility]) 160-4.5 MCG/ACT inhaler Inhale  2 puffs into the lungs 2 (two) times daily. 10/31/16   Bobbitt, Sedalia Muta, MD  Dexlansoprazole 30 MG capsule Take 1 capsule (30 mg total) by mouth daily. 10/31/16   Bobbitt, Sedalia Muta, MD  losartan-hydrochlorothiazide (HYZAAR) 50-12.5 MG tablet Take 1 tablet by mouth at bedtime.  05/24/15   [provider]  methocarbamol (ROBAXIN) 500 MG tablet Take 1 tablet (500 mg total) by mouth 2 (two) times daily. Patient not taking: Reported on 11/18/2016  06/30/15   Larene Pickett, PA-C  metoCLOPramide (REGLAN) 5 MG tablet Take 1 tablet (5 mg total) by mouth every 8 (eight) hours as needed for nausea. Patient not taking: Reported on 11/18/2016 02/27/16   Elnora Morrison, MD  naproxen (NAPROSYN) 500 MG tablet Take 1 tablet (500 mg total) by mouth 2 (two) times daily with a meal. Patient not taking: Reported on 11/18/2016 06/30/15   Larene Pickett, PA-C  oxyCODONE-acetaminophen (PERCOCET/ROXICET) 5-325 MG tablet Take 2 tablets by mouth every 4 (four) hours as needed for severe pain. 02/13/17   Fransico Meadow, PA-C  pantoprazole (PROTONIX) 20 MG tablet Take 20 mg by mouth daily.    [provider]  ranitidine (ZANTAC) 300 MG capsule Take 1 capsule (300 mg total) by mouth every evening. 10/31/16   Bobbitt, Sedalia Muta, MD  Respiratory Therapy Supplies (FLUTTER) DEVI Use as directed 10/31/16   Bobbitt, Sedalia Muta, MD    Family History Family History  Problem Relation Age of Onset  . Diabetes Mother   . Stomach cancer Father     Social History Social History   Tobacco Use  . Smoking status: Former Smoker    Last attempt to quit: 04/08/1994    Years since quitting: 22.8  . Smokeless tobacco: Never Used  Substance Use Topics  . Alcohol use: No  . Drug use: No     Allergies   Patient has no known allergies.   Review of Systems Review of Systems  HENT: Positive for ear pain.   Neurological: Negative for headaches.  All other systems reviewed and are negative.    Physical Exam Updated Vital Signs BP (!) 145/71 (BP Location: Left Arm)   Pulse 86   Temp 98.5 F (36.9 C) (Oral)   Resp 16   Ht 5\' 3"  (1.6 m)   Wt 104.3 kg (230 lb)   SpO2 96%   BMI 40.74 kg/m   Physical Exam  Constitutional: She appears well-developed and well-nourished. No distress.  HENT:  Head: Normocephalic and atraumatic.  Nose: Nose normal.  Mouth/Throat: Oropharynx is clear and moist.  Right tm dull, face nontender, no rash  Eyes:  Conjunctivae are normal.  Neck: Neck supple.  Cardiovascular: Normal rate and regular rhythm.  No murmur heard. Pulmonary/Chest: Effort normal and breath sounds normal. No respiratory distress.  Abdominal: Soft. There is no tenderness.  Musculoskeletal: She exhibits no edema.  Neurological: She is alert.  Skin: Skin is warm and dry.  Psychiatric: She has a normal mood and affect.  Nursing note and vitals reviewed.    ED Treatments / Results  Labs (all labs ordered are listed, but only abnormal results are displayed) Labs Reviewed - No data to display  EKG  EKG Interpretation None       Radiology No results found.  Procedures Procedures (including critical care time)  Medications Ordered in ED Medications - No data to display   Initial Impression / Assessment and Plan / ED Course  I have reviewed the triage vital  signs and the nursing notes.  Pertinent labs & imaging results that were available during my care of the patient were reviewed by me and considered in my medical decision making (see chart for details).     MDM possible pain from fluid behind tm.  I also considered trigeminal neuralgia as well as shingles pain preceding outbreak.  I explained to pt she needs recheck with her MD in 2 days The patient appears reasonably stabilized for admission considering the current resources, flow, and capabilities available in the ED at this time, and I doubt any other [facility] requiring further screening and/or treatment in the ED prior to admission.  Final Clinical Impressions(s) / ED Diagnoses   Final diagnoses:  Facial pain  Right ear pain    ED Discharge Orders        Ordered    amoxicillin (AMOXIL) 500 MG capsule  3 times daily     02/13/17 0802    oxyCODONE-acetaminophen (PERCOCET/ROXICET) 5-325 MG tablet  Every 4 hours PRN     02/13/17 0802    See Dr. Criss Rosales for recheck in 2-3 days.  You may have a condition known as trigeminal neuralgia.   Your Physician will  need to reexam you to make sure that you do not develop a rash at the site called shingles.  You have a small amount of fluid behind your eardrum.  I will treat you with antibiotics and pain medication to see if this resolves your symptoms   An After Visit Summary was printed and given to the patient.   Fransico Meadow, [place] 02/13/17 2263    Tegeler, Gwenyth Allegra, MD 02/13/17 [phone]

## 2017-02-16 DIAGNOSIS — R51 Headache: Secondary | ICD-10-CM | POA: Diagnosis not present

## 2017-03-08 DIAGNOSIS — R51 Headache: Secondary | ICD-10-CM | POA: Diagnosis not present

## 2017-03-08 DIAGNOSIS — M130 Polyarthritis, unspecified: Secondary | ICD-10-CM | POA: Diagnosis not present

## 2017-03-08 DIAGNOSIS — I10 Essential (primary) hypertension: Secondary | ICD-10-CM | POA: Diagnosis not present

## 2017-04-27 ENCOUNTER — Other Ambulatory Visit: Payer: Self-pay | Admitting: Allergy and Immunology

## 2017-04-27 DIAGNOSIS — K219 Gastro-esophageal reflux disease without esophagitis: Secondary | ICD-10-CM

## 2017-04-27 NOTE — Telephone Encounter (Signed)
RF on Zantac denied, pt needs an OV

## 2017-05-20 IMAGING — CT CT ANGIO CHEST
2 of 6 series · 18 of 36 positions shown · IV contrast (OMNIPAQUE 350)
Comparison: 03/27/2015 chest radiograph

CLINICAL DATA: Intermittent dizziness diaphoresis shortness of
breath early today, currently improved

EXAM:
CT ANGIOGRAPHY CHEST WITH CONTRAST
TECHNIQUE: Multidetector CT imaging of the chest was performed using the
standard protocol during bolus administration of intravenous
contrast. Multiplanar CT image reconstructions and MIPs were
obtained to evaluate the vascular anatomy.
CONTRAST:  100mL OMNIPAQUE IOHEXOL 350 MG/ML SOLN

[Series 5: coronal mpr · coronal · 0.43mm/px · 1 of 151 slices shown]
[im 76/151  mediastinal]
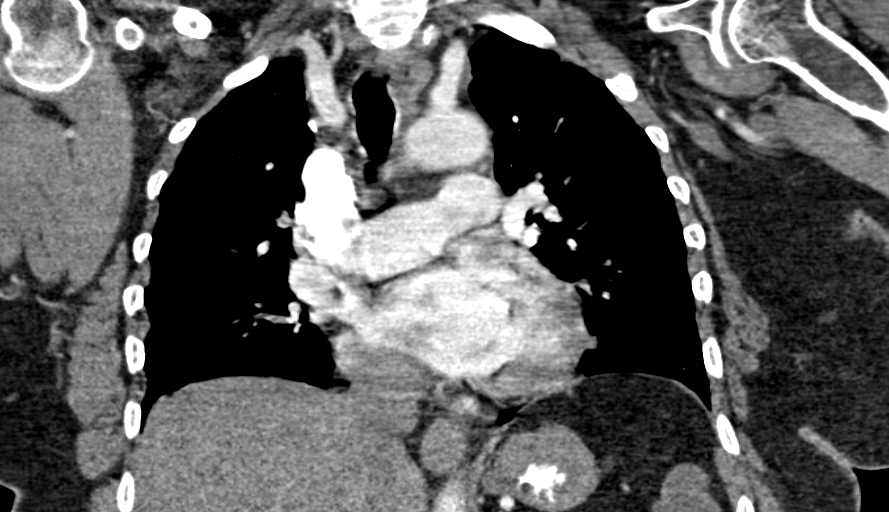

[Series 10: thins for pacs · axial · 0.74mm/px · z∈[+1436,+1634]mm · 17 of 221 slices shown]
[im 12/221  lung]
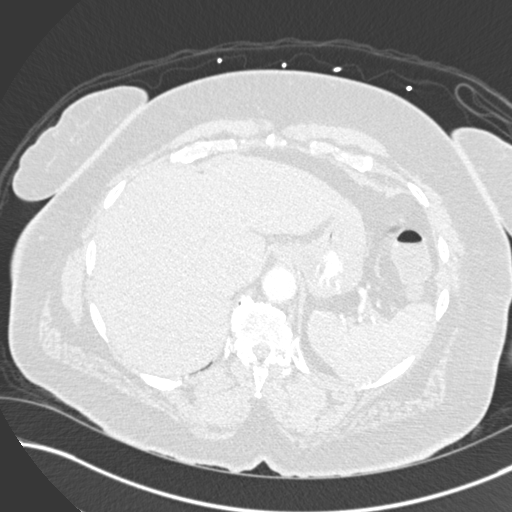
[im 23/221  mediastinal]
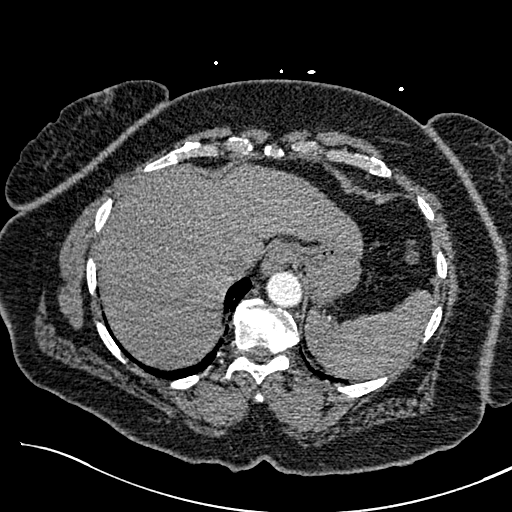
[im 34/221  lung]
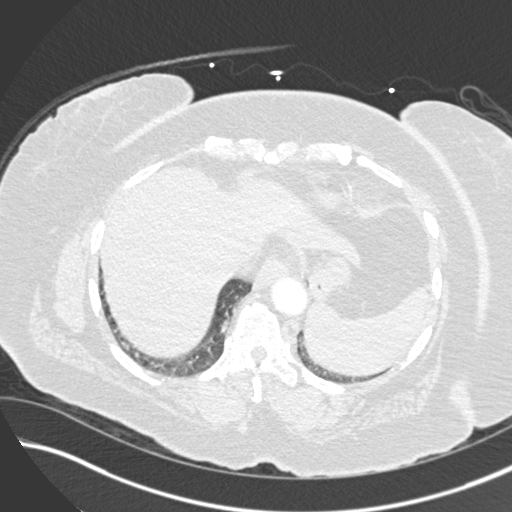
[im 45/221  mediastinal]
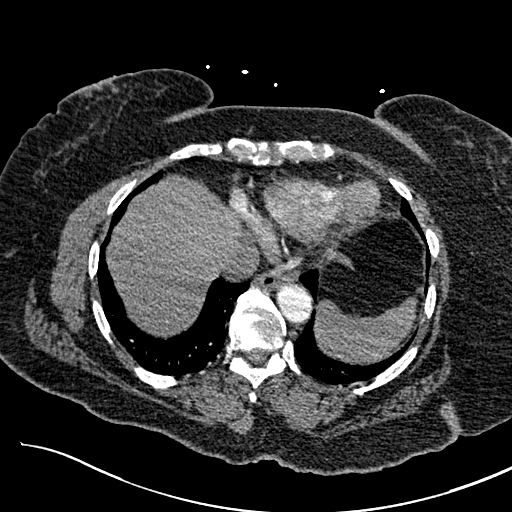
[im 67/221  lung]
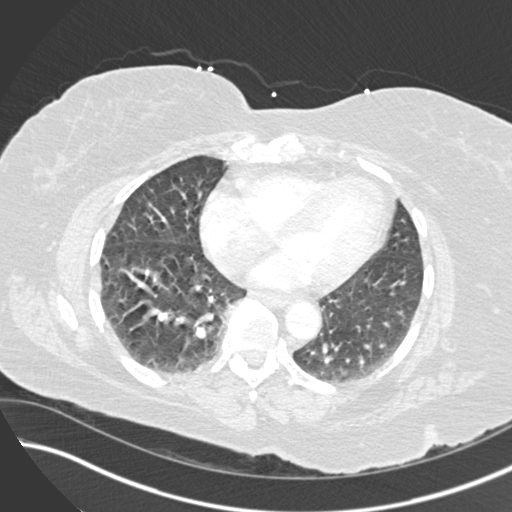
[im 78/221  mediastinal]
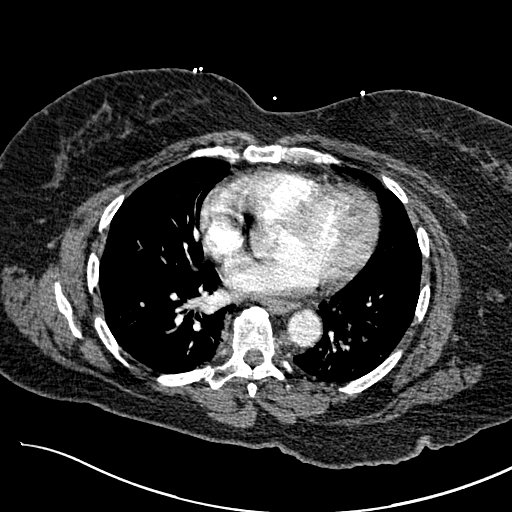
[im 89/221  lung]
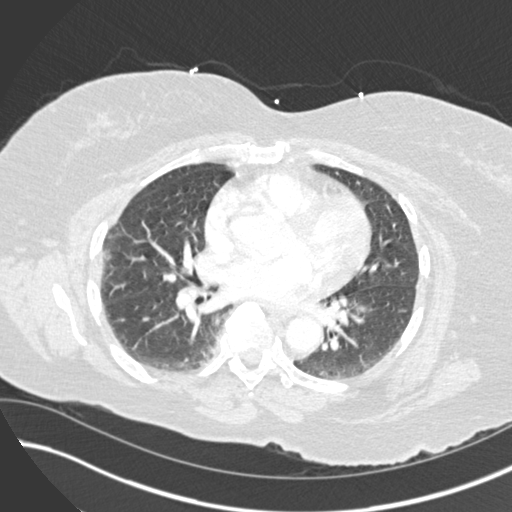
[im 100/221  mediastinal]
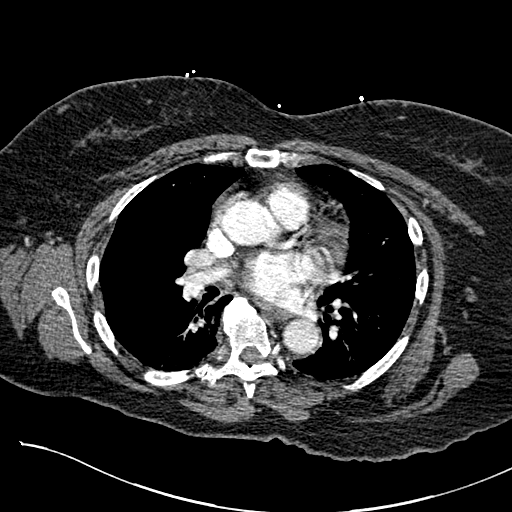
[im 111/221  lung]
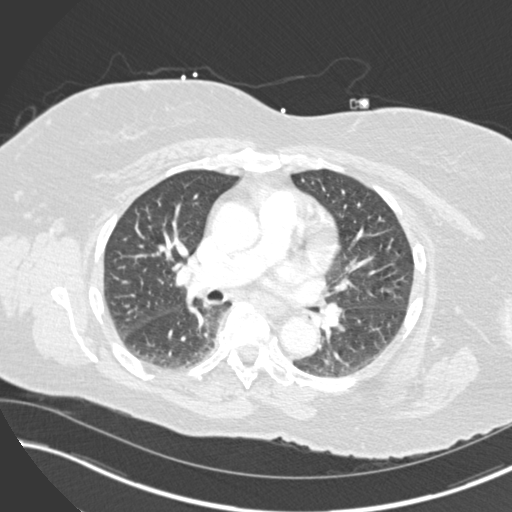
[im 122/221  mediastinal]
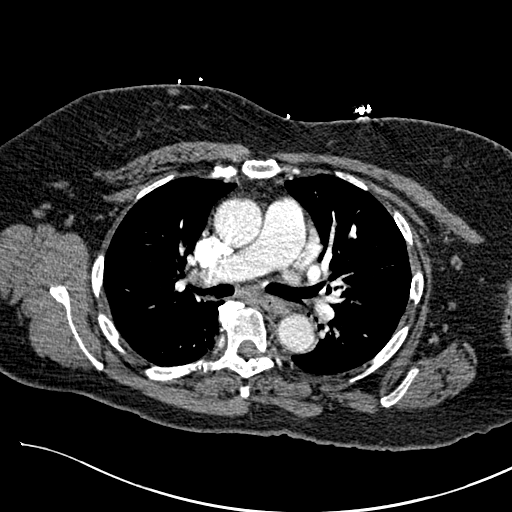
[im 133/221  lung]
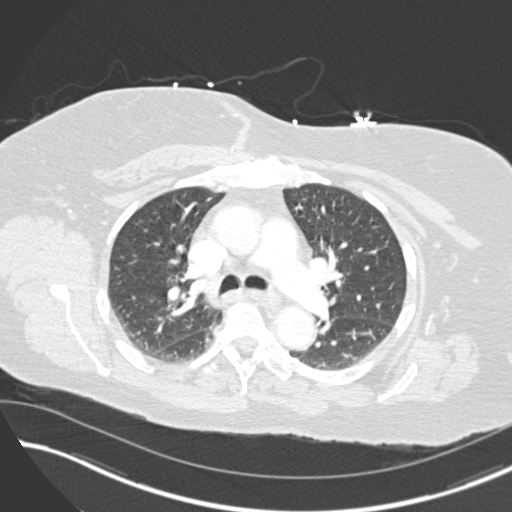
[im 144/221  mediastinal]
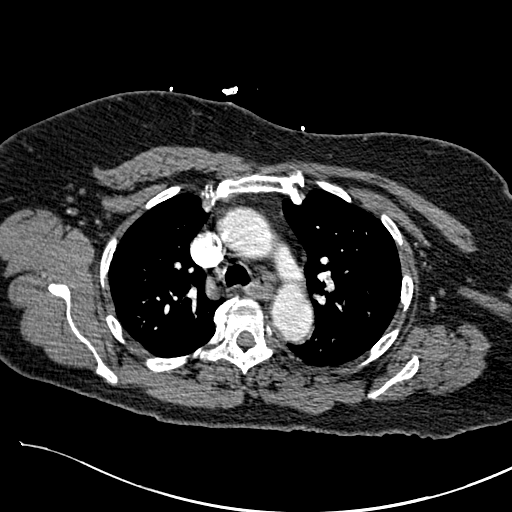
[im 155/221  lung]
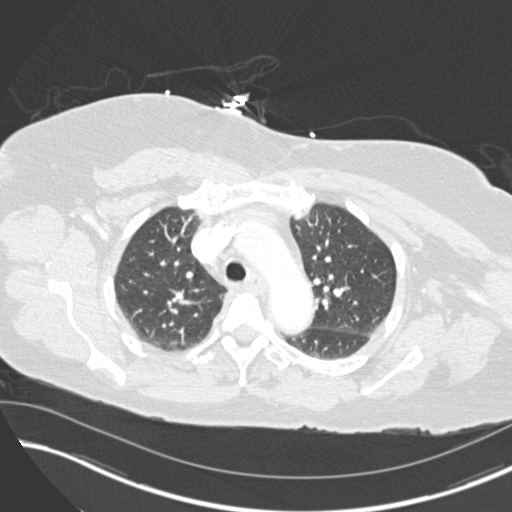
[im 177/221  mediastinal]
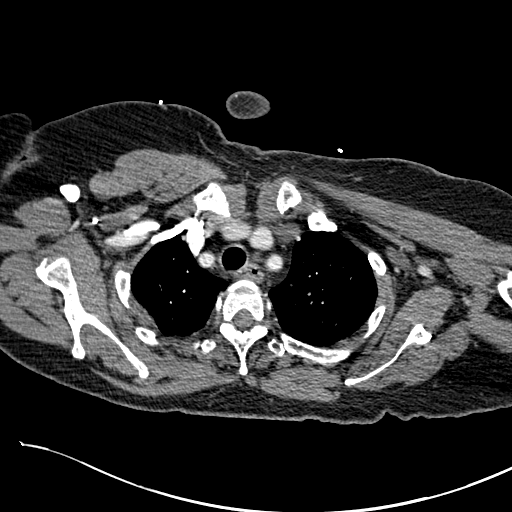
[im 188/221  lung]
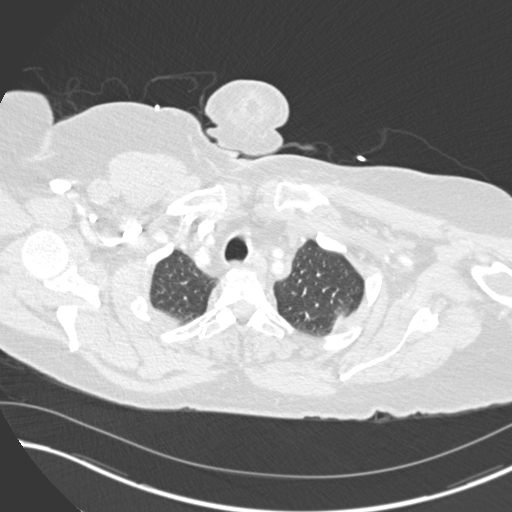
[im 199/221  mediastinal]
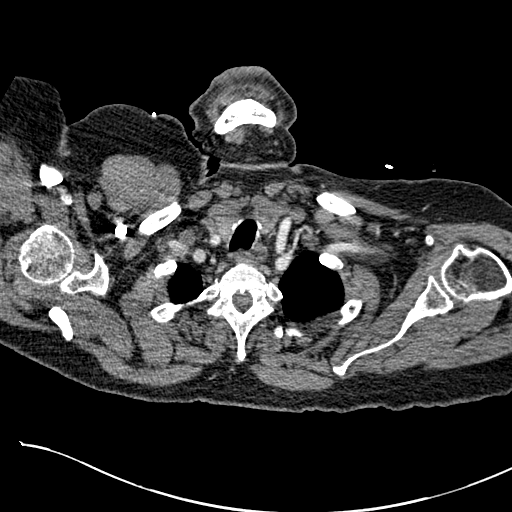
[im 210/221  lung]
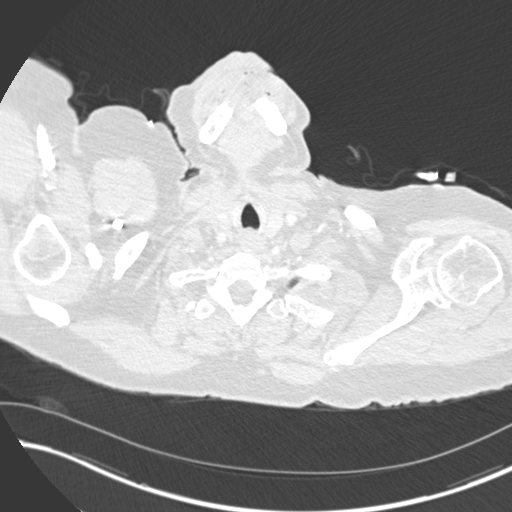

[18 of 36 positions shown; findings below may reference images not displayed]

FINDINGS: Mediastinum/Nodes: Thoracic aorta shows no dissection or dilatation.
No filling defects in the pulmonary arterial system although third
order lower lobe branches are seen in limited detail due to motion
artifact.

No pericardial effusion. No pathologic hilar or mediastinal
adenopathy.

Thoracic inlet normal.  She heart size normal.

Lungs/Pleura: No consolidation or infiltrate.  No pleural effusion.

Upper abdomen: No acute findings

Musculoskeletal: No acute findings

Review of the MIP images confirms the above findings.
IMPRESSION: No evidence of acute abnormality. No evidence of pulmonary embolism.
Mildly limited evaluation of the pulmonary arterial system due to
motion artifact obscuring lower lobe arteries.

## 2017-05-27 ENCOUNTER — Other Ambulatory Visit: Payer: Self-pay | Admitting: Allergy and Immunology

## 2017-05-27 DIAGNOSIS — K219 Gastro-esophageal reflux disease without esophagitis: Secondary | ICD-10-CM

## 2017-05-29 DIAGNOSIS — M130 Polyarthritis, unspecified: Secondary | ICD-10-CM | POA: Diagnosis not present

## 2017-05-29 DIAGNOSIS — I10 Essential (primary) hypertension: Secondary | ICD-10-CM | POA: Diagnosis not present

## 2017-05-29 DIAGNOSIS — R05 Cough: Secondary | ICD-10-CM | POA: Diagnosis not present

## 2017-05-29 DIAGNOSIS — J301 Allergic rhinitis due to pollen: Secondary | ICD-10-CM | POA: Diagnosis not present

## 2017-05-29 DIAGNOSIS — R569 Unspecified convulsions: Secondary | ICD-10-CM | POA: Diagnosis not present

## 2017-06-06 DIAGNOSIS — R058 Other specified cough: Secondary | ICD-10-CM

## 2017-06-06 HISTORY — DX: Other specified cough: R05.8

## 2017-06-12 ENCOUNTER — Other Ambulatory Visit: Payer: Self-pay

## 2017-06-12 ENCOUNTER — Emergency Department (HOSPITAL_COMMUNITY): Payer: Medicare HMO

## 2017-06-12 ENCOUNTER — Encounter (HOSPITAL_COMMUNITY): Payer: Self-pay

## 2017-06-12 ENCOUNTER — Inpatient Hospital Stay (HOSPITAL_COMMUNITY)
Admission: EM | Admit: 2017-06-12 | Discharge: 2017-06-14 | DRG: 101 | Disposition: A | Discharge status: Home / Self Care | Payer: Medicare HMO | Attending: Internal Medicine | Admitting: Internal Medicine

## 2017-06-12 ENCOUNTER — Inpatient Hospital Stay (HOSPITAL_COMMUNITY): Payer: Medicare HMO

## 2017-06-12 DIAGNOSIS — K219 Gastro-esophageal reflux disease without esophagitis: Secondary | ICD-10-CM | POA: Diagnosis present

## 2017-06-12 DIAGNOSIS — Z9049 Acquired absence of other specified parts of digestive tract: Secondary | ICD-10-CM | POA: Diagnosis not present

## 2017-06-12 DIAGNOSIS — R569 Unspecified convulsions: Secondary | ICD-10-CM | POA: Diagnosis not present

## 2017-06-12 DIAGNOSIS — E785 Hyperlipidemia, unspecified: Secondary | ICD-10-CM | POA: Diagnosis not present

## 2017-06-12 DIAGNOSIS — M1990 Unspecified osteoarthritis, unspecified site: Secondary | ICD-10-CM | POA: Diagnosis not present

## 2017-06-12 DIAGNOSIS — G40909 Epilepsy, unspecified, not intractable, without status epilepticus: Secondary | ICD-10-CM | POA: Diagnosis not present

## 2017-06-12 DIAGNOSIS — I10 Essential (primary) hypertension: Secondary | ICD-10-CM | POA: Diagnosis present

## 2017-06-12 DIAGNOSIS — I639 Cerebral infarction, unspecified: Secondary | ICD-10-CM | POA: Diagnosis present

## 2017-06-12 DIAGNOSIS — I69354 Hemiplegia and hemiparesis following cerebral infarction affecting left non-dominant side: Secondary | ICD-10-CM

## 2017-06-12 DIAGNOSIS — I110 Hypertensive heart disease with heart failure: Secondary | ICD-10-CM | POA: Diagnosis not present

## 2017-06-12 DIAGNOSIS — G960 Cerebrospinal fluid leak: Secondary | ICD-10-CM | POA: Diagnosis not present

## 2017-06-12 DIAGNOSIS — J45909 Unspecified asthma, uncomplicated: Secondary | ICD-10-CM | POA: Diagnosis present

## 2017-06-12 DIAGNOSIS — G930 Cerebral cysts: Secondary | ICD-10-CM | POA: Diagnosis present

## 2017-06-12 DIAGNOSIS — R51 Headache: Secondary | ICD-10-CM

## 2017-06-12 DIAGNOSIS — Z87891 Personal history of nicotine dependence: Secondary | ICD-10-CM

## 2017-06-12 DIAGNOSIS — Z800 Family history of malignant neoplasm of digestive organs: Secondary | ICD-10-CM

## 2017-06-12 DIAGNOSIS — R55 Syncope and collapse: Secondary | ICD-10-CM | POA: Diagnosis not present

## 2017-06-12 DIAGNOSIS — Z7982 Long term (current) use of aspirin: Secondary | ICD-10-CM | POA: Diagnosis not present

## 2017-06-12 DIAGNOSIS — I5032 Chronic diastolic (congestive) heart failure: Secondary | ICD-10-CM | POA: Diagnosis present

## 2017-06-12 DIAGNOSIS — G8110 Spastic hemiplegia affecting unspecified side: Secondary | ICD-10-CM | POA: Diagnosis present

## 2017-06-12 DIAGNOSIS — J310 Chronic rhinitis: Secondary | ICD-10-CM | POA: Diagnosis present

## 2017-06-12 DIAGNOSIS — Z833 Family history of diabetes mellitus: Secondary | ICD-10-CM

## 2017-06-12 DIAGNOSIS — K811 Chronic cholecystitis: Secondary | ICD-10-CM | POA: Diagnosis present

## 2017-06-12 DIAGNOSIS — R519 Headache, unspecified: Secondary | ICD-10-CM

## 2017-06-12 DIAGNOSIS — R05 Cough: Secondary | ICD-10-CM | POA: Diagnosis not present

## 2017-06-12 DIAGNOSIS — D649 Anemia, unspecified: Secondary | ICD-10-CM | POA: Diagnosis present

## 2017-06-12 LAB — CBC
HEMATOCRIT: 36.3 % (ref 36.0–46.0)
Hemoglobin: 11.6 g/dL — ABNORMAL LOW (ref 12.0–15.0)
MCH: 28.4 pg (ref 26.0–34.0)
MCHC: 32 g/dL (ref 30.0–36.0)
MCV: 89 fL (ref 78.0–100.0)
Platelets: 226 10*3/uL (ref 150–400)
RBC: 4.08 MIL/uL (ref 3.87–5.11)
RDW: 14 % (ref 11.5–15.5)
WBC: 4.3 10*3/uL (ref 4.0–10.5)

## 2017-06-12 LAB — COMPREHENSIVE METABOLIC PANEL
ALK PHOS: 56 U/L (ref 38–126)
ALT: 17 U/L (ref 14–54)
ANION GAP: 8 (ref 5–15)
AST: 16 U/L (ref 15–41)
Albumin: 3.5 g/dL (ref 3.5–5.0)
BILIRUBIN TOTAL: 0.3 mg/dL (ref 0.3–1.2)
BUN: 9 mg/dL (ref 6–20)
CO2: 27 mmol/L (ref 22–32)
CREATININE: 0.77 mg/dL (ref 0.44–1.00)
Calcium: 9.2 mg/dL (ref 8.9–10.3)
Chloride: 106 mmol/L (ref 101–111)
Glucose, Bld: 112 mg/dL — ABNORMAL HIGH (ref 65–99)
Potassium: 3.9 mmol/L (ref 3.5–5.1)
SODIUM: 141 mmol/L (ref 135–145)
TOTAL PROTEIN: 7.1 g/dL (ref 6.5–8.1)

## 2017-06-12 LAB — DIFFERENTIAL
Basophils Absolute: 0 10*3/uL (ref 0.0–0.1)
Basophils Relative: 1 %
EOS PCT: 7 %
Eosinophils Absolute: 0.3 10*3/uL (ref 0.0–0.7)
LYMPHS PCT: 52 %
Lymphs Abs: 2.2 10*3/uL (ref 0.7–4.0)
MONO ABS: 0.4 10*3/uL (ref 0.1–1.0)
MONOS PCT: 10 %
Neutro Abs: 1.3 10*3/uL — ABNORMAL LOW (ref 1.7–7.7)
Neutrophils Relative %: 30 %

## 2017-06-12 LAB — PROTIME-INR
INR: 1.03
PROTHROMBIN TIME: 13.4 s (ref 11.4–15.2)

## 2017-06-12 LAB — I-STAT TROPONIN, ED: TROPONIN I, POC: 0 ng/mL (ref 0.00–0.08)

## 2017-06-12 LAB — APTT: APTT: 28 s (ref 24–36)

## 2017-06-12 MED ORDER — ETODOLAC 400 MG PO TABS
400.0000 mg | ORAL_TABLET | Freq: Two times a day (BID) | ORAL | Status: DC | PRN
  Filled 2017-06-12: qty 1

## 2017-06-12 MED ORDER — SODIUM CHLORIDE 0.9 % IV SOLN: INTRAVENOUS | Status: DC

## 2017-06-12 MED ORDER — ONDANSETRON HCL 4 MG PO TABS: 4.0000 mg | ORAL_TABLET | Freq: Four times a day (QID) | ORAL | Status: DC | PRN

## 2017-06-12 MED ORDER — GADOBENATE DIMEGLUMINE 529 MG/ML IV SOLN
20.0000 mL | Freq: Once | INTRAVENOUS | Status: AC | PRN
  Administered 2017-06-12: 20 mL via INTRAVENOUS

## 2017-06-12 MED ORDER — BISACODYL 10 MG RE SUPP: 10.0000 mg | Freq: Every day | RECTAL | Status: DC | PRN

## 2017-06-12 MED ORDER — HEPARIN SODIUM (PORCINE) 5000 UNIT/ML IJ SOLN
5000.0000 [IU] | Freq: Three times a day (TID) | INTRAMUSCULAR | Status: DC
  Administered 2017-06-12: 5000 [IU] via SUBCUTANEOUS
  Filled 2017-06-12: qty 1

## 2017-06-12 MED ORDER — PANTOPRAZOLE SODIUM 40 MG PO TBEC
40.0000 mg | DELAYED_RELEASE_TABLET | Freq: Every day | ORAL | Status: DC
  Administered 2017-06-13 – 2017-06-14 (×2): 40 mg via ORAL
  Filled 2017-06-12 (×2): qty 1

## 2017-06-12 MED ORDER — SENNOSIDES-DOCUSATE SODIUM 8.6-50 MG PO TABS: 1.0000 | ORAL_TABLET | Freq: Every evening | ORAL | Status: DC | PRN

## 2017-06-12 MED ORDER — HYDROCHLOROTHIAZIDE 12.5 MG PO CAPS
12.5000 mg | ORAL_CAPSULE | Freq: Every day | ORAL | Status: DC
  Administered 2017-06-13 – 2017-06-14 (×2): 12.5 mg via ORAL
  Filled 2017-06-12 (×2): qty 1

## 2017-06-12 MED ORDER — ACETAMINOPHEN 650 MG RE SUPP: 650.0000 mg | Freq: Four times a day (QID) | RECTAL | Status: DC | PRN

## 2017-06-12 MED ORDER — SODIUM CHLORIDE 0.9 % IV SOLN
75.0000 mL/h | INTRAVENOUS | Status: DC
  Administered 2017-06-12 – 2017-06-13 (×2): 75 mL/h via INTRAVENOUS

## 2017-06-12 MED ORDER — ONDANSETRON HCL 4 MG/2ML IJ SOLN: 4.0000 mg | Freq: Four times a day (QID) | INTRAMUSCULAR | Status: DC | PRN

## 2017-06-12 MED ORDER — DIPHENHYDRAMINE HCL 50 MG/ML IJ SOLN
25.0000 mg | Freq: Once | INTRAMUSCULAR | Status: AC
  Administered 2017-06-12: 25 mg via INTRAVENOUS
  Filled 2017-06-12: qty 1

## 2017-06-12 MED ORDER — AZELASTINE HCL 0.15 % NA SOLN: 1.0000 | Freq: Every day | NASAL | Status: DC | PRN

## 2017-06-12 MED ORDER — LOSARTAN POTASSIUM 50 MG PO TABS
50.0000 mg | ORAL_TABLET | Freq: Every day | ORAL | Status: DC
  Administered 2017-06-13 – 2017-06-14 (×2): 50 mg via ORAL
  Filled 2017-06-12 (×2): qty 1

## 2017-06-12 MED ORDER — ALBUTEROL SULFATE (2.5 MG/3ML) 0.083% IN NEBU: 2.5000 mg | INHALATION_SOLUTION | Freq: Four times a day (QID) | RESPIRATORY_TRACT | Status: DC | PRN

## 2017-06-12 MED ORDER — ACETAMINOPHEN 325 MG PO TABS: 650.0000 mg | ORAL_TABLET | Freq: Four times a day (QID) | ORAL | Status: DC | PRN

## 2017-06-12 MED ORDER — LORAZEPAM 2 MG/ML IJ SOLN: 1.0000 mg | Freq: Once | INTRAMUSCULAR | Status: DC | PRN

## 2017-06-12 MED ORDER — PROCHLORPERAZINE EDISYLATE 10 MG/2ML IJ SOLN
10.0000 mg | Freq: Once | INTRAMUSCULAR | Status: AC
  Administered 2017-06-12: 10 mg via INTRAVENOUS
  Filled 2017-06-12: qty 2

## 2017-06-12 MED ORDER — HYDROCODONE-ACETAMINOPHEN 5-325 MG PO TABS: 1.0000 | ORAL_TABLET | ORAL | Status: DC | PRN

## 2017-06-12 MED ORDER — MOMETASONE FURO-FORMOTEROL FUM 200-5 MCG/ACT IN AERO
2.0000 | INHALATION_SPRAY | Freq: Two times a day (BID) | RESPIRATORY_TRACT | Status: DC
  Administered 2017-06-12 – 2017-06-14 (×4): 2 via RESPIRATORY_TRACT
  Filled 2017-06-12: qty 8.8

## 2017-06-12 MED ORDER — ASPIRIN EC 81 MG PO TBEC
81.0000 mg | DELAYED_RELEASE_TABLET | Freq: Every day | ORAL | Status: DC
  Administered 2017-06-13 – 2017-06-14 (×2): 81 mg via ORAL
  Filled 2017-06-12 (×2): qty 1

## 2017-06-12 MED ORDER — VALPROATE SODIUM 500 MG/5ML IV SOLN
20.0000 mg/kg | Freq: Once | INTRAVENOUS | Status: AC
  Administered 2017-06-12: 2000 mg via INTRAVENOUS
  Filled 2017-06-12: qty 20

## 2017-06-12 MED ORDER — AZELASTINE HCL 0.1 % NA SOLN: 1.0000 | Freq: Every day | NASAL | Status: DC | PRN

## 2017-06-12 MED ORDER — LOSARTAN POTASSIUM-HCTZ 50-12.5 MG PO TABS: 1.0000 | ORAL_TABLET | Freq: Every day | ORAL | Status: DC

## 2017-06-12 NOTE — ED Provider Notes (Signed)
[facility] Provider Note   CSN: [id number] Arrival date & time: 06/12/17  1829     History   Chief Complaint Chief Complaint  Patient presents with  . Headache    HPI [name] is a 67 y.o. female.  The history is provided by the patient, medical records and a relative.  Seizures   This is a new problem. The current episode started more than 2 days ago. The problem has been resolved. There were 2 to 3 seizures. The most recent episode lasted 2 to 5 minutes. Associated symptoms include headaches and cough. Pertinent negatives include no confusion, no speech difficulty, no visual disturbance, no neck stiffness, no sore throat, no chest pain, no nausea, no vomiting and no diarrhea. Characteristics include rhythmic jerking and loss of consciousness. The episode was witnessed. The seizures did not continue in the ED. There has been no fever. There were no medications administered prior to arrival.  Headache   This is a new problem. The current episode started more than 1 week ago. The problem occurs constantly. The problem has not changed since onset.The headache is associated with nothing. The pain is located in the right unilateral region. The quality of the pain is described as dull, sharp and throbbing. The pain is at a severity of 6/10. The pain is moderate. The pain does not radiate. Pertinent negatives include no fever, no chest pressure, no palpitations, no shortness of breath, no nausea and no vomiting. She has tried nothing for the symptoms. The treatment provided no relief.    Past Medical History:  Diagnosis Date  . Arthritis   . Asthma   . Difficulty sleeping   . Gallstones   . Hyperlipidemia   . Hypertension   . Preoperative clearance   . Spasmodic cough    "IT COMES IN DIFFERENT SEASONS"  . Stroke ([place]) 2015   WEAKNESS L SIDE OF BODY     Patient Active Problem List   Diagnosis Date Noted  . GERD (gastroesophageal reflux  disease) 10/31/2016  . Chronic rhinitis 10/31/2016  . Chronic cholecystitis 04/12/2015  . Preoperative clearance 03/17/2015  . Spastic hemiplegia affecting nondominant side ([place]) 10/27/2013  . Adhesive capsulitis of right shoulder 10/27/2013  . Chronic diastolic heart failure ([place]) 08/26/2013  . Dyslipidemia 08/26/2013  . Borderline diabetes 08/26/2013  . CVA (cerebral infarction) 08/26/2013  . Acute CVA (cerebrovascular accident) ([place]) 08/23/2013  . Pre-syncope 08/17/2012  . Hypokalemia 08/17/2012  . ARTHRITIS 03/28/2010  . DE QUERVAIN'S TENOSYNOVITIS 03/28/2010  . Normocytic anemia 07/26/2009  . Moderate persistent asthma 03/09/2009  . DENTAL CARIES 03/09/2009  . CANDIDIASIS OF VULVA AND VAGINA 06/04/2008  . SKIN TAG 06/04/2008  . ARTHRITIS, KNEES, BILATERAL 05/22/2008  . HYPERTENSION, BENIGN ESSENTIAL 05/04/2008  . KNEE PAIN, BILATERAL 05/04/2008  . Cough, persistent 05/04/2008  . MENISCUS TEAR, RIGHT 12/07/2005    Past Surgical History:  Procedure Laterality Date  . CHOLECYSTECTOMY N/A 04/12/2015   Procedure: LAPAROSCOPIC CHOLECYSTECTOMY WITH INTRAOPERATIVE CHOLANGIOGRAM;  Surgeon: Greer Pickerel, MD;  Location: [facility];  Service: General;  Laterality: N/A;  . KNEE ARTHROSCOPY  2010     OB History   None      Home Medications    Prior to Admission medications   Medication Sig Start Date End Date Taking? Authorizing Provider  albuterol (PROVENTIL HFA;VENTOLIN HFA) 108 (90 Base) MCG/ACT inhaler Inhale 2 puffs into the lungs every 6 (six) hours as needed for wheezing or shortness of breath. 10/31/16  Bobbitt, Sedalia Muta, MD  amoxicillin (AMOXIL) 500 MG capsule Take 1 capsule (500 mg total) by mouth 3 (three) times daily. 02/13/17   Fransico Meadow, PA-C  atorvastatin (LIPITOR) 40 MG tablet Take 1 tablet (40 mg total) by mouth daily at 6 PM. Patient taking differently: Take 40 mg by mouth every evening.  09/18/13   Angiulli, Lavon Paganini, PA-C  Azelastine HCl 0.15 % SOLN 1 spray  in each nostril 1-2 times daily as needed. Patient taking differently: Place 1 spray into both nostrils daily as needed (congestion).  10/31/16   Bobbitt, Sedalia Muta, MD  budesonide-formoterol (SYMBICORT) 160-4.5 MCG/ACT inhaler Inhale 2 puffs into the lungs 2 (two) times daily. 10/31/16   Bobbitt, Sedalia Muta, MD  Dexlansoprazole 30 MG capsule Take 1 capsule (30 mg total) by mouth daily. 10/31/16   Bobbitt, Sedalia Muta, MD  losartan-hydrochlorothiazide (HYZAAR) 50-12.5 MG tablet Take 1 tablet by mouth at bedtime.  05/24/15   [provider]  methocarbamol (ROBAXIN) 500 MG tablet Take 1 tablet (500 mg total) by mouth 2 (two) times daily. Patient not taking: Reported on 11/18/2016 06/30/15   Larene Pickett, PA-C  metoCLOPramide (REGLAN) 5 MG tablet Take 1 tablet (5 mg total) by mouth every 8 (eight) hours as needed for nausea. Patient not taking: Reported on 11/18/2016 02/27/16   Elnora Morrison, MD  naproxen (NAPROSYN) 500 MG tablet Take 1 tablet (500 mg total) by mouth 2 (two) times daily with a meal. Patient not taking: Reported on 11/18/2016 06/30/15   Larene Pickett, PA-C  oxyCODONE-acetaminophen (PERCOCET/ROXICET) 5-325 MG tablet Take 2 tablets by mouth every 4 (four) hours as needed for severe pain. 02/13/17   Fransico Meadow, PA-C  pantoprazole (PROTONIX) 20 MG tablet Take 20 mg by mouth daily.    [provider]  ranitidine (ZANTAC) 300 MG capsule Take 1 capsule (300 mg total) by mouth every evening. 10/31/16   Bobbitt, Sedalia Muta, MD  Respiratory Therapy Supplies (FLUTTER) DEVI Use as directed 10/31/16   Bobbitt, Sedalia Muta, MD    Family History Family History  Problem Relation Age of Onset  . Diabetes Mother   . Stomach cancer Father     Social History Social History   Tobacco Use  . Smoking status: Former Smoker    Last attempt to quit: 04/08/1994    Years since quitting: 23.1  . Smokeless tobacco: Never Used  Substance Use Topics  . Alcohol use: No  . Drug  use: No     Allergies   Patient has no known allergies.   Review of Systems Review of Systems  Constitutional: Negative for chills, fatigue and fever.  HENT: Negative for congestion and sore throat.   Eyes: Negative for visual disturbance.  Respiratory: Positive for cough. Negative for chest tightness, shortness of breath and stridor.   Cardiovascular: Negative for chest pain, palpitations and leg swelling.  Gastrointestinal: Positive for constipation. Negative for abdominal pain, diarrhea, nausea and vomiting.  Genitourinary: Negative for dysuria.  Musculoskeletal: Negative for back pain, neck pain and neck stiffness.  Neurological: Positive for seizures, loss of consciousness, weakness and headaches. Negative for facial asymmetry, speech difficulty and light-headedness.  Psychiatric/Behavioral: Negative for confusion.  All other systems reviewed and are negative.    Physical Exam Updated Vital Signs BP (!) 168/89 (BP Location: Right Arm)   Pulse 86   Temp 99 F (37.2 C) (Oral)   SpO2 99%   Physical Exam  Constitutional: She is oriented to person, place, and  time. She appears well-developed and well-nourished.  Non-toxic appearance. She does not appear ill. No distress.  HENT:  Head: Normocephalic and atraumatic.  Mouth/Throat: Oropharynx is clear and moist. No oropharyngeal exudate.  Eyes: Pupils are equal, round, and reactive to light. Conjunctivae are normal.  Neck: Normal range of motion. Neck supple.  Cardiovascular: Normal rate and intact distal pulses.  No murmur heard. Pulmonary/Chest: Effort normal and breath sounds normal. No respiratory distress. She has no wheezes. She exhibits no tenderness.  Abdominal: Soft. She exhibits no distension. There is no tenderness.  Musculoskeletal: She exhibits no tenderness or deformity.  Lymphadenopathy:    She has no cervical adenopathy.  Neurological: She is alert and oriented to person, place, and time. She is not  disoriented. She displays no tremor. No cranial nerve deficit or sensory deficit. She exhibits abnormal muscle tone. GCS eye subscore is 4. GCS verbal subscore is 5. GCS motor subscore is 6.  Left arm and left leg weakness.  At baseline per patient report.  Skin: Capillary refill takes less than 2 seconds. No rash noted. She is not diaphoretic. No erythema.  Psychiatric: She has a normal mood and affect.  Nursing note and vitals reviewed.    ED Treatments / Results  Labs (all labs ordered are listed, but only abnormal results are displayed) Labs Reviewed  CBC - Abnormal; Notable for the following components:      Result Value   Hemoglobin 11.6 (*)    All other components within normal limits  DIFFERENTIAL - Abnormal; Notable for the following components:   Neutro Abs 1.3 (*)    All other components within normal limits  COMPREHENSIVE METABOLIC PANEL - Abnormal; Notable for the following components:   Glucose, Bld 112 (*)    All other components within normal limits  PROTIME-INR  APTT  RAPID URINE DRUG SCREEN, HOSP PERFORMED  BASIC METABOLIC PANEL  CBC  I-STAT TROPONIN, ED    EKG EKG Interpretation  Date/Time:  Tuesday Jun 12 2017 09:09:52 EDT Ventricular Rate:  83 PR Interval:  206 QRS Duration: 86 QT Interval:  400 QTC Calculation: 470 R Axis:   51 Text Interpretation:  Normal sinus rhythm Normal ECG When compared to prior, no signifiacnt changes seen.  No STEMI Confirmed by Antony Blackbird [phone]) on 06/12/2017 1:17:55 PM   Radiology Dg Chest 2 View  Result Date: 06/12/2017 CLINICAL DATA:  Spasms of coughing. History of asthma, previous CVA, former smoker. EXAM: CHEST - 2 VIEW COMPARISON:  Chest x-ray of November 18 2016 FINDINGS: The lungs are adequately inflated. There is no focal infiltrate. There is no pleural effusion. The interstitial markings are coarse. The heart and pulmonary vascularity are normal. The mediastinum is normal in width. The trachea is midline. The  bony thorax exhibits no acute abnormality. IMPRESSION: Mild chronic interstitial prominence likely reflects the known reactive airway disease and smoking history. No alveolar pneumonia nor CHF. Electronically Signed   By: David  Martinique M.D.   On: 06/12/2017 13:58   Ct Head Wo Contrast  Result Date: 06/12/2017 CLINICAL DATA:  Generalized headache for 1 month, history hypertension, hyperlipidemia, asthma, stroke, former smoker EXAM: CT HEAD WITHOUT CONTRAST TECHNIQUE: Contiguous axial images were obtained from the base of the skull through the vertex without intravenous contrast. Sagittal and coronal MPR images reconstructed from axial data set. COMPARISON:  02/27/2016 FINDINGS: Brain: Normal ventricular morphology. CSF attenuation collection again identified at the anterior aspect of the RIGHT middle cranial fossa, 3.9 x 2.5 x 4.2 cm  in size consistent with an arachnoid cyst, grossly stable. Mass effect upon the anterior aspect of the RIGHT temporal lobe appears unchanged. Additional low-attenuation collection within the RIGHT frontoparietal subdural space consistent with chronic hygroma measuring up to 9 mm thick previously 10 mm. 2 mm of RIGHT to LEFT midline shift unchanged. Minimal small vessel chronic ischemic changes of deep cerebral white matter. No acute intracranial hemorrhage, mass lesion or evidence of acute infarction. Posterior fossa unremarkable. Vascular: Atherosclerotic calcifications of the internal carotid arteries bilaterally at skull base. Skull: Intact Sinuses/Orbits: Visualized paranasal sinuses and mastoid air cells clear Other: N/A IMPRESSION: Chronic low-attenuation RIGHT frontoparietal subdural hygroma 9 mm thick, previously 10 mm. Arachnoid cyst at anterior aspect RIGHT middle cranial fossa 3.9 x 2.5 x 4.2 cm with mass effect upon the anterior aspect of the RIGHT temporal lobe. 2 mm RIGHT to LEFT midline shift unchanged. Minimal small vessel chronic ischemic changes of deep cerebral  white matter. No acute intracranial abnormalities. Electronically Signed   By: Lavonia Dana M.D.   On: 06/12/2017 10:06   Mr Brain W And Wo Contrast  Result Date: 06/12/2017 CLINICAL DATA:  Intermittent severe headaches for 1 month. Syncopal episodes and possible seizure. Prior stroke. EXAM: MRI HEAD WITHOUT AND WITH CONTRAST TECHNIQUE: Multiplanar, multiecho pulse sequences of the brain and surrounding structures were obtained without and with intravenous contrast. CONTRAST:  50mL MULTIHANCE GADOBENATE DIMEGLUMINE 529 MG/ML IV SOLN COMPARISON:  Head CT 06/12/2017 and MRI 08/23/2013 FINDINGS: The patient terminated the examination prematurely, and the coronal T1 postcontrast sequence is incomplete. Brain: There is no acute infarct. 3 cm arachnoid cyst in the right middle cranial fossa is unchanged from the prior MRI. Subdural fluid collection over the right cerebral convexity measures up to 10 mm in thickness and follows CSF signal intensity on all pulse sequences consistent with a hygroma, unchanged from today's earlier CT though new from 2015. There is associated mild right cerebral hemispheric sulcal effacement and partial right lateral ventricle effacement with 3 mm of leftward midline shift. Patchy cerebral white matter T2 hyperintensities have mildly progressed from 2015 and are nonspecific but compatible with moderate chronic small vessel ischemic disease. There is a chronic infarct involving the posterior right corona radiata and posterior lentiform nucleus, acute on the prior MRI. A small developmental venous anomaly is incidentally noted in the left cerebellar hemisphere. No abnormal enhancement is identified elsewhere. The hippocampi are symmetric in size and signal. Vascular: Major intracranial vascular flow voids are preserved. Skull and upper cervical spine: Unremarkable bone marrow signal. Sinuses/Orbits: Unremarkable orbits. No significant paranasal sinus disease. Clear mastoid air cells. Other:  None. IMPRESSION: 1. No acute intracranial abnormality. 2. Moderate chronic small vessel ischemic disease. 3. Chronic right-sided subdural hygroma with mild mass effect. 4. Right middle cranial fossa arachnoid cyst. Electronically Signed   By: Logan Bores M.D.   On: 06/12/2017 17:29    Procedures Procedures (including critical care time)  Medications Ordered in ED Medications  prochlorperazine (COMPAZINE) injection 10 mg (10 mg Intravenous Given 06/12/17 1356)  diphenhydrAMINE (BENADRYL) injection 25 mg (25 mg Intravenous Given 06/12/17 1355)     Initial Impression / Assessment and Plan / ED Course  I have reviewed the triage vital signs and the nursing notes.  Pertinent labs & imaging results that were available during my care of the patient were reviewed by me and considered in my medical decision making (see chart for details).     KENZLEE FISHBURN is a 67 y.o. female with  past medical history significant for hypertension, hyper lipidemia, asthma, and prior stroke with persistent left-sided weakness in her arm and leg who presents with headache and multiple seizures.  Patient reports that over the last several weeks she has had several seizures.  She reports that she has been having headache nearly constantly which she describes as a pressure pain on the right side of her head.  She reports it is worsened with coughing which she has been having for the last few weeks.  She denies fevers, chills, chest pain, shortness of breath.  She denies any nausea, vomiting, or vision changes.  She denies any urinary symptoms but does report some mild constipation that is chronic.  She denies any recent trauma or recent falls.  The last seizure was last week and the daughter reports that the patient slumped over in her chair stuck her tongue out and shook while being unresponsive.  She reports this lasted for several minutes before resolving.  Patient has no history of seizures to her knowledge.  On exam,  patient had weakness in her left arm and left leg.  No sensation abnormality's.  Patient had no facial droop and normal sensation of the face.  Normal extraocular movements.  Lungs were clear and chest was nontender.  Abdomen nontender.  Based on patient's symptoms of headache and seizures I am concerned of evolution of the patient's prior stroke.  Patient will have laboratory testing and CT imaging performed.  Given the patient's cough, will obtain chest x-ray.  Next  Patient's laboratory testing was overall reassuring.  Mild anemia similar to prior.  No leukocytosis.  Metabolic panel reassuring.  CT of the head showed a subdural hygroma on the right, and arachnoid cyst on the right with mass-effect on the right temporal lobe, similar midline shift from right to left, and some chronic small vessel ischemic changes.  No other acute and chronic normality seen.  Patient was given a headache cocktail and reassess.  Chest x-ray shows no pneumonia.     Given the patient's new seizures in the setting of the prior stroke and midline shift, will call neurology to discuss further imaging or disposition needs.  3:28 PM Neurology recommended patient be admitted for EEG and MRI with and without contrast of the brain.  Neurology will come see the patient however they feel due to her multiple seizures over the last few weeks and the ab normalities on her CT she needs further imaging and further management.  Hospitalist team will be called for admission.  On reassessment, patient reports her headache is improved after headache medications.      Final Clinical Impressions(s) / ED Diagnoses   Final diagnoses:  Seizure ([place])  Acute nonintractable headache, unspecified headache type    ED Discharge Orders    None      Clinical Impression: 1. Seizure ([place])   2. Acute nonintractable headache, unspecified headache type     Disposition: Admit  This note was prepared with assistance of Dragon voice  recognition software. Occasional wrong-word or sound-a-like substitutions may have occurred due to the inherent limitations of voice recognition software.     Cyleigh Massaro, Gwenyth Allegra, MD 06/12/17 2351

## 2017-06-12 NOTE — ED Notes (Signed)
Neurology at bedside.

## 2017-06-12 NOTE — Consult Note (Signed)
NEURO HOSPITALIST CONSULT NOTE   Requestig physician: Dr. Sherry Ruffing  Reason for Consult:Seizures  History obtained from:   Patient, Chart and Family  HPI:                                                                                                                                          [name] is an 67 y.o. female with a history of HTN, hyperlipidemia who presents to ED today complaining of a worsening headache. Daughter stated after arrival to ED that patient has been having seizures. Two seizure like episodes noted with the most recent having been 2-3 weeks ago. Patient unable to recall how long ago first seizure was but roughly was last Summer. The first seizure was unwitnessed; unclear if bowel and bladder loss was associated with that seizure as patient leaks/ has trouble controlling urine  at baseline. With the witnessed second seizure daughter reports that patient slumped to the right side and then had whole body shaking with her tongue hanging out of her mouth. The seizure lasted 3-5 minutes and afterwards the patient was unable to talk for a few minutes. Denies history of seizures other than the two above.   Has had no difficulty speaking, no neck stiffness, no chest pain, no nausea and vomiting.  Denies fever or recent illness. Denies any numbness, tingling or weakness of any extremities. She has had a headache which has lasted about a week or more and is not relieved by the aspirin she takes daily. HA is constant and is not alleviated by anything. Pain is located on the right side and described as throbbing. She has tried no other medications.   MRI Brain w/ wO contrast, CT head w/o contrast CBC, CMP, CBC, BMP, INR, Tox screen all ordered.  CT results: IMPRESSION: Chronic low-attenuation RIGHT frontoparietal subdural hygroma 9 mm thick, previously 10 mm. Arachnoid cyst at anterior aspect RIGHT middle cranial fossa 3.9 x 2.5 x 4.2 cm with mass effect upon the  anterior aspect of the RIGHT temporal lobe. 2 mm RIGHT to LEFT midline shift unchanged. Minimal small vessel chronic ischemic changes of deep cerebral white matter. No acute intracranial abnormalities.   Past Medical History:  Diagnosis Date  . Arthritis   . Asthma   . Difficulty sleeping   . Gallstones   . Hyperlipidemia   . Hypertension   . Preoperative clearance   . Spasmodic cough    "IT COMES IN DIFFERENT SEASONS"  . Stroke ([place]) 2015   WEAKNESS L SIDE OF BODY     Past Surgical History:  Procedure Laterality Date  . CHOLECYSTECTOMY N/A 04/12/2015   Procedure: LAPAROSCOPIC CHOLECYSTECTOMY WITH INTRAOPERATIVE CHOLANGIOGRAM;  Surgeon: Greer Pickerel, MD;  Location: [facility];  Service: General;  Laterality: N/A;  . KNEE  ARTHROSCOPY  2010    Family History  Problem Relation Age of Onset  . Diabetes Mother   . Stomach cancer Father        Social History:  reports that she quit smoking about 23 years ago. She has never used smokeless tobacco. She reports that she does not drink alcohol or use drugs.  No Known Allergies  MEDICATIONS:                                                                                                                     Current Facility-Administered Medications  Medication Dose Route Frequency Provider Last Rate Last Dose  . valproate (DEPACON) 2,000 mg in dextrose 5 % 50 mL IVPB  20 mg/kg (Order-Specific) Intravenous Once Tegeler, Gwenyth Allegra, MD       Current Outpatient Medications  Medication Sig Dispense Refill  . albuterol (PROVENTIL HFA;VENTOLIN HFA) 108 (90 Base) MCG/ACT inhaler Inhale 2 puffs into the lungs every 6 (six) hours as needed for wheezing or shortness of breath. 1 Inhaler 2  . aspirin EC 81 MG tablet Take 81 mg by mouth daily.    Marland Kitchen Dexlansoprazole 30 MG capsule Take 1 capsule (30 mg total) by mouth daily. 30 capsule 5  . ranitidine (ZANTAC) 300 MG capsule Take 1 capsule (300 mg total) by mouth every evening. 30 capsule 5  .  amoxicillin (AMOXIL) 500 MG capsule Take 1 capsule (500 mg total) by mouth 3 (three) times daily. 30 capsule 0  . atorvastatin (LIPITOR) 40 MG tablet Take 1 tablet (40 mg total) by mouth daily at 6 PM. (Patient taking differently: Take 40 mg by mouth every evening. ) 30 tablet 1  . Azelastine HCl 0.15 % SOLN 1 spray in each nostril 1-2 times daily as needed. (Patient taking differently: Place 1 spray into both nostrils daily as needed (congestion). ) 30 mL 5  . budesonide-formoterol (SYMBICORT) 160-4.5 MCG/ACT inhaler Inhale 2 puffs into the lungs 2 (two) times daily. 1 Inhaler 5  . etodolac (LODINE) 400 MG tablet Take 400 mg by mouth 2 (two) times daily as needed for pain.  0  . losartan-hydrochlorothiazide (HYZAAR) 50-12.5 MG tablet Take 1 tablet by mouth at bedtime.   2  . methocarbamol (ROBAXIN) 500 MG tablet Take 1 tablet (500 mg total) by mouth 2 (two) times daily. (Patient not taking: Reported on 11/18/2016) 20 tablet 0  . metoCLOPramide (REGLAN) 5 MG tablet Take 1 tablet (5 mg total) by mouth every 8 (eight) hours as needed for nausea. (Patient not taking: Reported on 11/18/2016) 10 tablet 0  . naproxen (NAPROSYN) 500 MG tablet Take 1 tablet (500 mg total) by mouth 2 (two) times daily with a meal. (Patient not taking: Reported on 11/18/2016) 30 tablet 0  . oxyCODONE-acetaminophen (PERCOCET/ROXICET) 5-325 MG tablet Take 2 tablets by mouth every 4 (four) hours as needed for severe pain. 15 tablet 0  . pantoprazole (PROTONIX) 20 MG tablet Take 20 mg by mouth daily.    Marland Kitchen Respiratory Therapy  Supplies (FLUTTER) DEVI Use as directed 1 each 0     ROS:                                                                                                                                       History obtained from the patient  General ROS: negative for - chills, fatigue, fever, night sweats, weight gain or weight loss Genito-Urinary ROS: negative for -stress incontinence when coughing Musculoskeletal ROS:  negative for - joint swelling or muscular weakness Neurological ROS: as noted in HPI Dermatological ROS: negative for rash and skin lesion changes   Blood pressure 138/77, pulse 78, temperature 98.9 F (37.2 C), resp. rate 13, SpO2 94 %.   General Examination:                                                                                                      Physical Exam  HEENT-  [place]/AT    Lungs- Respirations unlabored  Extremities- Warm and well perfused  Neurological Examination Mental Status: Alert, oriented, thought content appropriate.  Speech fluent without evidence of aphasia.   Cranial Nerves: II: Visual fields grossly normal without extinction to DSS  III,IV, VI: Ptosis not present, horizontal EOMI without nystagmus VII: smile symmetric VIII: hearing intact to voice IX,X: No hypophonia XII: midline tongue extension Motor: RUE and RLE 5/5 LUE 2-3/5 proximally and distally LLE 3-4/5  Cerebellar: No ataxia with FNF on right. Unable to perform on left due to chronic LUE weakness Gait: Deferred   Lab Results: Basic Metabolic Panel: Recent Labs  Lab 06/12/17 0924  NA 141  K 3.9  CL 106  CO2 27  GLUCOSE 112*  BUN 9  CREATININE 0.77  CALCIUM 9.2    CBC: Recent Labs  Lab 06/12/17 0924  WBC 4.3  NEUTROABS 1.3*  HGB 11.6*  HCT 36.3  MCV 89.0  PLT 226    Cardiac Enzymes: No results for input(s): CKTOTAL, CKMB, CKMBINDEX, TROPONINI in the last 168 hours.  Lipid Panel: No results for input(s): CHOL, TRIG, HDL, CHOLHDL, VLDL, LDLCALC in the last 168 hours.  Imaging: Dg Chest 2 View  Result Date: 06/12/2017 CLINICAL DATA:  Spasms of coughing. History of asthma, previous CVA, former smoker. EXAM: CHEST - 2 VIEW COMPARISON:  Chest x-ray of November 18 2016 FINDINGS: The lungs are adequately inflated. There is no focal infiltrate. There is no pleural effusion. The interstitial markings are coarse. The heart and pulmonary vascularity are normal. The mediastinum  is  normal in width. The trachea is midline. The bony thorax exhibits no acute abnormality. IMPRESSION: Mild chronic interstitial prominence likely reflects the known reactive airway disease and smoking history. No alveolar pneumonia nor CHF. Electronically Signed   By: David  Martinique M.D.   On: 06/12/2017 13:58   Ct Head Wo Contrast  Result Date: 06/12/2017 CLINICAL DATA:  Generalized headache for 1 month, history hypertension, hyperlipidemia, asthma, stroke, former smoker EXAM: CT HEAD WITHOUT CONTRAST TECHNIQUE: Contiguous axial images were obtained from the base of the skull through the vertex without intravenous contrast. Sagittal and coronal MPR images reconstructed from axial data set. COMPARISON:  02/27/2016 FINDINGS: Brain: Normal ventricular morphology. CSF attenuation collection again identified at the anterior aspect of the RIGHT middle cranial fossa, 3.9 x 2.5 x 4.2 cm in size consistent with an arachnoid cyst, grossly stable. Mass effect upon the anterior aspect of the RIGHT temporal lobe appears unchanged. Additional low-attenuation collection within the RIGHT frontoparietal subdural space consistent with chronic hygroma measuring up to 9 mm thick previously 10 mm. 2 mm of RIGHT to LEFT midline shift unchanged. Minimal small vessel chronic ischemic changes of deep cerebral white matter. No acute intracranial hemorrhage, mass lesion or evidence of acute infarction. Posterior fossa unremarkable. Vascular: Atherosclerotic calcifications of the internal carotid arteries bilaterally at skull base. Skull: Intact Sinuses/Orbits: Visualized paranasal sinuses and mastoid air cells clear Other: N/A IMPRESSION: Chronic low-attenuation RIGHT frontoparietal subdural hygroma 9 mm thick, previously 10 mm. Arachnoid cyst at anterior aspect RIGHT middle cranial fossa 3.9 x 2.5 x 4.2 cm with mass effect upon the anterior aspect of the RIGHT temporal lobe. 2 mm RIGHT to LEFT midline shift unchanged. Minimal small  vessel chronic ischemic changes of deep cerebral white matter. No acute intracranial abnormalities. Electronically Signed   By: Lavonia Dana M.D.   On: 06/12/2017 10:06    History documented by Laurey Morale, NP-C, [facility], [id number] 06/12/2017, 3:45 PM   Assessment: 67 year old female with second seizure of lifetime occurring approximately 3 weeks ago. Presents with chronic daily headache since last seizure.  1. New onset seizures. May be secondary to cortical irritability adjacent to the right frontoparietal subdural hygroma seen on CT. 2. Chronic right sided headache. The headache is on the same side as the hygroma. However, the hygroma is now slightly thinner than on the prior CT from January of 2018.  Recommendations: 1. Depakote has been loaded. Continue at 5 mg/kg TID. 2. Headache management as per primary team. If initial attempts fail, consider phenergan 25 mg IV x 1 3. EEG.  4. MRI brain.   Electronically signed: Dr. Kerney Elbe

## 2017-06-12 NOTE — ED Triage Notes (Signed)
Pt reports she has had intermittent "terrible headaches" X1 month. Pt reports over the past month she has had syncopal episodes and what her daughter believes is a seizure. Hx of stroke in 2015. Pt alert and oriented in triage with left side deficits noted from previous stroke.

## 2017-06-12 NOTE — Care Management (Signed)
Very pleasant 67 year old female who presented to our facility with headache and seizures.  CT scan shows an arachnoid cyst.  She was seen by neurology and an EEG, and MRI and headache cocktail were prescribed.  She received a loading dose of valproic acid and will also be receiving Keppra.  She is now headache free.  Full documentation to follow.

## 2017-06-12 NOTE — ED Notes (Signed)
MRI called to check pts availability

## 2017-06-12 NOTE — H&P (Signed)
History and Physical    [name]:[id number] DOB: October 11, 1950 DOA: 06/12/2017   PCP: [name], MD   Patient coming from:  Home    Chief Complaint: Seizures  HPI: [name] is a 67 y.o. female with medical history significant for hypertension, hyperlipidemia, GERD, and a history of right CVA, with left-sided weakness, presenting to the emergency department with right-sided headaches, as well as 2 seizure episodes, for which she did not seek medical attention.  1 of the episodes occurred about 2 weeks ago, and the last one 1 week ago, described as trouble controlling urine, body shaking, and "tongur  hanging from the mouth ".  The episode appears to have lasted about 3 to 5 minutes.  Today, the new seizure episode was described similarly, was witnessed by her daughter, who reported the patient slumping to the right, whole body shaking, tone "hanging out of the mouth ", also lasting 3 to 5 minutes, and after that, the patient was unable to talk for a few minutes, postictal.  The patient denies any fever, chills, recent illnesses, numbness or tingling, or new weakness in the extremities.  Of note, the headache was described as dull, with intermittent periods of throbbing, get at times, relieved with pain medication.  Denies any vision changes.  Denies any dysphagia, or dysarthria.  She denies any neck stiffness.  Denies any chest pain, palpitations, shortness of breath or cough, nausea vomiting or diarrhea.  On presentation, she did not have further seizures.  ED Course:  BP 121/83   Pulse 86   Temp 98.9 F (37.2 C)   Resp 15   SpO2 92%   At the ER, a CT of the head show right parietal subdural hygroma, 9 mm , previously 10 mm in 2018, when she had a stroke.  In addition, she had arachnoid seized in the anterior aspect of the right middle cranial fossa, 3.9 x 2.5 x 4.2 cm with mass-effect upon the anterior aspect of the right temporal lobe, and 2 mm right to left midline shift, unchanged.   No acute intracranial abnormalities MRI of the brain with and without contrast is pending. 112 glucose Sodium 141, potassium 3.9, creatinine 0.77.  Alkaline phosphatase 56.  Normal LFTs.  Total bilirubin 0.3.  Troponin 0.  White count 4.3, hemoglobin 11.6, platelets 126. EKG, normal sinus rhythm, normal EKG, when compared to prior, with no significant changes seen. Chest x-ray with mild chronic interstitial prominence, reflecting likely known reactive airway disease, without pneumonia or CHF She received Compazine, nausea. Neurology evaluated the patient, placing her on loading dose of Depacon at 2000 mg, and consultation is pending.  Review of Systems:  As per HPI otherwise all other systems reviewed and are negative  Past Medical History:  Diagnosis Date  . Arthritis   . Asthma   . Difficulty sleeping   . Gallstones   . Hyperlipidemia   . Hypertension   . Preoperative clearance   . Spasmodic cough    "IT COMES IN DIFFERENT SEASONS"  . Stroke ([place]) 2015   WEAKNESS L SIDE OF BODY     Past Surgical History:  Procedure Laterality Date  . CHOLECYSTECTOMY N/A 04/12/2015   Procedure: LAPAROSCOPIC CHOLECYSTECTOMY WITH INTRAOPERATIVE CHOLANGIOGRAM;  Surgeon: Greer Pickerel, MD;  Location: [facility];  Service: General;  Laterality: N/A;  . KNEE ARTHROSCOPY  2010    Social History Social History   Socioeconomic History  . Marital status: Single    Spouse name: Not on file  .  Number of children: Not on file  . Years of education: Not on file  . Highest education level: Not on file  Occupational History  . Not on file  Social Needs  . Financial resource strain: Not on file  . Food insecurity:    Worry: Not on file    Inability: Not on file  . Transportation needs:    Medical: Not on file    Non-medical: Not on file  Tobacco Use  . Smoking status: Former Smoker    Last attempt to quit: 04/08/1994    Years since quitting: 23.1  . Smokeless tobacco: Never Used  Substance and Sexual  Activity  . Alcohol use: No  . Drug use: No  . Sexual activity: Not on file  Lifestyle  . Physical activity:    Days per week: Not on file    Minutes per session: Not on file  . Stress: Not on file  Relationships  . Social connections:    Talks on phone: Not on file    Gets together: Not on file    Attends religious service: Not on file    Active member of club or organization: Not on file    Attends meetings of clubs or organizations: Not on file    Relationship status: Not on file  . Intimate partner violence:    Fear of current or ex partner: Not on file    Emotionally abused: Not on file    Physically abused: Not on file    Forced sexual activity: Not on file  Other Topics Concern  . Not on file  Social History Narrative  . Not on file     No Known Allergies  Family History  Problem Relation Age of Onset  . Diabetes Mother   . Stomach cancer Father       Prior to Admission medications   Medication Sig Start Date End Date Taking? Authorizing Provider  albuterol (PROVENTIL HFA;VENTOLIN HFA) 108 (90 Base) MCG/ACT inhaler Inhale 2 puffs into the lungs every 6 (six) hours as needed for wheezing or shortness of breath. 10/31/16  Yes Bobbitt, Sedalia Muta, MD  aspirin EC 81 MG tablet Take 81 mg by mouth daily.   Yes [provider]  Dexlansoprazole 30 MG capsule Take 1 capsule (30 mg total) by mouth daily. 10/31/16  Yes Bobbitt, Sedalia Muta, MD  ranitidine (ZANTAC) 300 MG capsule Take 1 capsule (300 mg total) by mouth every evening. 10/31/16  Yes Bobbitt, Sedalia Muta, MD  amoxicillin (AMOXIL) 500 MG capsule Take 1 capsule (500 mg total) by mouth 3 (three) times daily. 02/13/17   Fransico Meadow, PA-C  atorvastatin (LIPITOR) 40 MG tablet Take 1 tablet (40 mg total) by mouth daily at 6 PM. Patient taking differently: Take 40 mg by mouth every evening.  09/18/13   Angiulli, Lavon Paganini, PA-C  Azelastine HCl 0.15 % SOLN 1 spray in each nostril 1-2 times daily as  needed. Patient taking differently: Place 1 spray into both nostrils daily as needed (congestion).  10/31/16   Bobbitt, Sedalia Muta, MD  budesonide-formoterol (SYMBICORT) 160-4.5 MCG/ACT inhaler Inhale 2 puffs into the lungs 2 (two) times daily. 10/31/16   Bobbitt, Sedalia Muta, MD  etodolac (LODINE) 400 MG tablet Take 400 mg by mouth 2 (two) times daily as needed for pain. 05/18/17   [provider]  losartan-hydrochlorothiazide (HYZAAR) 50-12.5 MG tablet Take 1 tablet by mouth at bedtime.  05/24/15   [provider]  metoCLOPramide (REGLAN) 5 MG  tablet Take 1 tablet (5 mg total) by mouth every 8 (eight) hours as needed for nausea. Patient not taking: Reported on 11/18/2016 02/27/16   Elnora Morrison, MD  naproxen (NAPROSYN) 500 MG tablet Take 1 tablet (500 mg total) by mouth 2 (two) times daily with a meal. Patient not taking: Reported on 11/18/2016 06/30/15   Larene Pickett, PA-C  oxyCODONE-acetaminophen (PERCOCET/ROXICET) 5-325 MG tablet Take 2 tablets by mouth every 4 (four) hours as needed for severe pain. 02/13/17   Fransico Meadow, PA-C  Respiratory Therapy Supplies (FLUTTER) DEVI Use as directed 10/31/16   Bobbitt, Sedalia Muta, MD    Physical Exam:  Vitals:   06/12/17 1500 06/12/17 1515 06/12/17 1530 06/12/17 1545  BP: 132/81 138/77 132/83 121/83  Pulse: 79 78 80 86  Resp: 14 13 17 15   Temp:      TempSrc:      SpO2: 94% 94% 96% 92%   Constitutional: NAD, calm, comfortable  Eyes: PERRL, lids and conjunctivae normal ENMT: Mucous membranes are moist, without exudate or lesions  Neck: normal, supple, no masses, no thyromegaly Respiratory: clear to auscultation bilaterally, no wheezing, no crackles. Normal respiratory effort  Cardiovascular: Regular rate and rhythm,  murmur, rubs or gallops. No extremity edema. 2+ pedal pulses. No carotid bruits.  Abdomen: Soft, obese non tender, No hepatosplenomegaly. Bowel sounds positive.  Musculoskeletal: no clubbing / cyanosis.  Moves all extremities Skin: no jaundice, No lesions.  Neurologic: Sensation intact  Known spastic hemiplegia L  Psychiatric:   Alert and oriented x 3. Normal mood.     Labs on Admission: I have personally reviewed following labs and imaging studies  CBC: Recent Labs  Lab 06/12/17 0924  WBC 4.3  NEUTROABS 1.3*  HGB 11.6*  HCT 36.3  MCV 89.0  PLT 765    Basic Metabolic Panel: Recent Labs  Lab 06/12/17 0924  NA 141  K 3.9  CL 106  CO2 27  GLUCOSE 112*  BUN 9  CREATININE 0.77  CALCIUM 9.2    GFR: CrCl cannot be calculated (Unknown ideal weight.).  Liver Function Tests: Recent Labs  Lab 06/12/17 0924  AST 16  ALT 17  ALKPHOS 56  BILITOT 0.3  PROT 7.1  ALBUMIN 3.5   No results for input(s): LIPASE, AMYLASE in the last 168 hours. No results for input(s): AMMONIA in the last 168 hours.  Coagulation Profile: Recent Labs  Lab 06/12/17 0924  INR 1.03    Cardiac Enzymes: No results for input(s): CKTOTAL, CKMB, CKMBINDEX, TROPONINI in the last 168 hours.  BNP (last 3 results) No results for input(s): PROBNP in the last 8760 hours.  HbA1C: No results for input(s): HGBA1C in the last 72 hours.  CBG: No results for input(s): GLUCAP in the last 168 hours.  Lipid Profile: No results for input(s): CHOL, HDL, LDLCALC, TRIG, CHOLHDL, LDLDIRECT in the last 72 hours.  Thyroid Function Tests: No results for input(s): TSH, T4TOTAL, FREET4, T3FREE, THYROIDAB in the last 72 hours.  Anemia Panel: No results for input(s): VITAMINB12, FOLATE, FERRITIN, TIBC, IRON, RETICCTPCT in the last 72 hours.  Urine analysis:    Component Value Date/Time   COLORURINE STRAW (A) 09/27/2016 2020   APPEARANCEUR CLEAR 09/27/2016 2020   LABSPEC 1.004 (L) 09/27/2016 2020   PHURINE 6.0 09/27/2016 2020   GLUCOSEU NEGATIVE 09/27/2016 2020   HGBUR NEGATIVE 09/27/2016 2020   HGBUR trace-intact 03/28/2010 [place] 09/27/2016 2020   [place] 09/27/2016  2020   PROTEINUR NEGATIVE  09/27/2016 2020   UROBILINOGEN 0.2 08/24/2013 0428   NITRITE NEGATIVE 09/27/2016 2020   LEUKOCYTESUR NEGATIVE 09/27/2016 2020    Sepsis Labs: @LABRCNTIP (procalcitonin:4,lacticidven:4) )No results found for this or any previous visit (from the past 240 hour(s)).   Radiological Exams on Admission: Dg Chest 2 View  Result Date: 06/12/2017 CLINICAL DATA:  Spasms of coughing. History of asthma, previous CVA, former smoker. EXAM: CHEST - 2 VIEW COMPARISON:  Chest x-ray of November 18 2016 FINDINGS: The lungs are adequately inflated. There is no focal infiltrate. There is no pleural effusion. The interstitial markings are coarse. The heart and pulmonary vascularity are normal. The mediastinum is normal in width. The trachea is midline. The bony thorax exhibits no acute abnormality. IMPRESSION: Mild chronic interstitial prominence likely reflects the known reactive airway disease and smoking history. No alveolar pneumonia nor CHF. Electronically Signed   By: David  Martinique M.D.   On: 06/12/2017 13:58   Ct Head Wo Contrast  Result Date: 06/12/2017 CLINICAL DATA:  Generalized headache for 1 month, history hypertension, hyperlipidemia, asthma, stroke, former smoker EXAM: CT HEAD WITHOUT CONTRAST TECHNIQUE: Contiguous axial images were obtained from the base of the skull through the vertex without intravenous contrast. Sagittal and coronal MPR images reconstructed from axial data set. COMPARISON:  02/27/2016 FINDINGS: Brain: Normal ventricular morphology. CSF attenuation collection again identified at the anterior aspect of the RIGHT middle cranial fossa, 3.9 x 2.5 x 4.2 cm in size consistent with an arachnoid cyst, grossly stable. Mass effect upon the anterior aspect of the RIGHT temporal lobe appears unchanged. Additional low-attenuation collection within the RIGHT frontoparietal subdural space consistent with chronic hygroma measuring up to 9 mm thick previously 10 mm. 2 mm of RIGHT to  LEFT midline shift unchanged. Minimal small vessel chronic ischemic changes of deep cerebral white matter. No acute intracranial hemorrhage, mass lesion or evidence of acute infarction. Posterior fossa unremarkable. Vascular: Atherosclerotic calcifications of the internal carotid arteries bilaterally at skull base. Skull: Intact Sinuses/Orbits: Visualized paranasal sinuses and mastoid air cells clear Other: N/A IMPRESSION: Chronic low-attenuation RIGHT frontoparietal subdural hygroma 9 mm thick, previously 10 mm. Arachnoid cyst at anterior aspect RIGHT middle cranial fossa 3.9 x 2.5 x 4.2 cm with mass effect upon the anterior aspect of the RIGHT temporal lobe. 2 mm RIGHT to LEFT midline shift unchanged. Minimal small vessel chronic ischemic changes of deep cerebral white matter. No acute intracranial abnormalities. Electronically Signed   By: Lavonia Dana M.D.   On: 06/12/2017 10:06    EKG: Independently reviewed.  Assessment/Plan Principal Problem:   Seizures (HCC) Active Problems:   Normocytic anemia   HYPERTENSION, BENIGN ESSENTIAL   Chronic diastolic heart failure (HCC)   Cerebral infarction (HCC)   Spastic hemiplegia affecting nondominant side (HCC)   Chronic cholecystitis   GERD (gastroesophageal reflux disease)   Chronic rhinitis   Asthma   Hyperlipidemia   Seizures, recurrent, last episode today prior to admission.  Patient also described symptoms of constant right-sided headaches, without any vision, or abnormality in sensation.  The patient has chronic left sided weakness, but there is no facial droop, or other unilateral weaknesses.  CT imaging was performed, which showed a subdural hygroma on the right, arachnoid cyst in the right, with mass-effect on the right temporal lobe, similar midline shift from right to left, and some chronic small vessel ischemic changes, without acute intracranial abnormality seen.  She received headache cocktail, with significant improvement of her headache.   Neurology evaluation was obtained in view of  her seizures.  He was given Depacon 2000 mg loading dose at the ER, and other recommendations are pending Admit to telemetry inpatient Patient likely to continue on Depacon, awaiting recommendations from neurology on dose Continue pain control for her headaches and antiemetics Monitor for any further seizures or mental status changes Appreciate neurology involvement   Hypertension BP 121/83   Pulse 86   Temp 98.9 F (37.2 C)   Resp 15   SpO2 92%  Continue home anti-hypertensive medications    Hyperlipidemia Continue home statins  GERD, no acute symptoms Continue PPI  DVT prophylaxis:  Heparin  Code Status:    FUll  Family Communication:  Discussed with patient Disposition Plan: Expect patient to be discharged to home after condition improves Consults called:    Neuro, Dr. Cheral Marker Admission status:  IP Tele   Sharene Butters, PA-C [facility]   Amion text  [phone]   06/12/2017, 3:56 PM

## 2017-06-13 ENCOUNTER — Inpatient Hospital Stay (HOSPITAL_COMMUNITY): Payer: Medicare HMO

## 2017-06-13 DIAGNOSIS — R569 Unspecified convulsions: Secondary | ICD-10-CM

## 2017-06-13 DIAGNOSIS — R51 Headache: Secondary | ICD-10-CM

## 2017-06-13 DIAGNOSIS — I10 Essential (primary) hypertension: Secondary | ICD-10-CM

## 2017-06-13 DIAGNOSIS — E785 Hyperlipidemia, unspecified: Secondary | ICD-10-CM

## 2017-06-13 DIAGNOSIS — J45909 Unspecified asthma, uncomplicated: Secondary | ICD-10-CM

## 2017-06-13 DIAGNOSIS — K219 Gastro-esophageal reflux disease without esophagitis: Secondary | ICD-10-CM

## 2017-06-13 LAB — CBC
HEMATOCRIT: 33.4 % — AB (ref 36.0–46.0)
Hemoglobin: 10.5 g/dL — ABNORMAL LOW (ref 12.0–15.0)
MCH: 28.1 pg (ref 26.0–34.0)
MCHC: 31.4 g/dL (ref 30.0–36.0)
MCV: 89.3 fL (ref 78.0–100.0)
PLATELETS: 189 10*3/uL (ref 150–400)
RBC: 3.74 MIL/uL — AB (ref 3.87–5.11)
RDW: 14.1 % (ref 11.5–15.5)
WBC: 3.8 10*3/uL — AB (ref 4.0–10.5)

## 2017-06-13 LAB — BASIC METABOLIC PANEL
Anion gap: 7 (ref 5–15)
BUN: 8 mg/dL (ref 6–20)
CHLORIDE: 106 mmol/L (ref 101–111)
CO2: 30 mmol/L (ref 22–32)
CREATININE: 0.79 mg/dL (ref 0.44–1.00)
Calcium: 8.7 mg/dL — ABNORMAL LOW (ref 8.9–10.3)
GFR calc non Af Amer: >60 mL/min (ref >60)
Glucose, Bld: 96 mg/dL (ref 65–99)
POTASSIUM: 3.7 mmol/L (ref 3.5–5.1)
SODIUM: 143 mmol/L (ref 135–145)

## 2017-06-13 MED ORDER — ENOXAPARIN SODIUM 40 MG/0.4ML ~~LOC~~ SOLN
40.0000 mg | SUBCUTANEOUS | Status: DC
  Filled 2017-06-13: qty 0.4

## 2017-06-13 MED ORDER — VALPROATE SODIUM 500 MG/5ML IV SOLN
500.0000 mg | Freq: Three times a day (TID) | INTRAVENOUS | Status: DC
  Administered 2017-06-13 – 2017-06-14 (×2): 500 mg via INTRAVENOUS
  Filled 2017-06-13 (×4): qty 5

## 2017-06-13 NOTE — Progress Notes (Signed)
EEG completed, results pending. 
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
## 2017-06-13 NOTE — Procedures (Signed)
ELECTROENCEPHALOGRAM REPORT  Date of Study: 06/13/17  Patient's Name: TANDREA KOMMER MRN: [id number] Date of Birth: 1950-10-17  Referring Provider:   Clinical History: [name] is a 67 y.o. female who presented with seizures. She does have the significant past medical history for hypertension, dyslipidemia, GERD and history of right CVA with left-sided hemiparesis. Patient reports two epileptic events .  Head CT with no acute changes, chronic right frontotemporal subdural hygroma, arachynoid cyst at the anterior aspect right middle lobe cranial fossa with mass-effect upon the anterior aspect of the right temporal lobe. Head MRI (06/12/17) with chronic Rt sided subdural hygroma (mild mass effect) and Rt middle cranial fossa arachnoid cyst.  Medications: Scheduled Meds: . aspirin EC  81 mg Oral Daily  . enoxaparin (LOVENOX) injection  40 mg Subcutaneous Q24H  . losartan  50 mg Oral Daily   And  . hydrochlorothiazide  12.5 mg Oral Daily  . mometasone-formoterol  2 puff Inhalation BID  . pantoprazole  40 mg Oral Daily   Continuous Infusions: . valproate sodium     PRN Meds:.acetaminophen **OR** acetaminophen, albuterol, azelastine, bisacodyl, etodolac, HYDROcodone-acetaminophen, LORazepam, ondansetron **OR** ondansetron (ZOFRAN) IV, senna-docusate            Technical Summary: This is a standard 16 channel EEG recording performed according to the international 10-20 electrode system.  AP bipolar, transverse bipolar, and referential montages were obtained, and digitally reformatted as necessary.  Duration of Tracing: 21:48  Description: In the awake state there is a 10 Hz alpha rhythm seen from the posterior head regions in a symmetric fashion.  Drowsiness is noted by dropout of background alpha and vertex sharp activity, and stage II sleep was noted with sleep spindles.   Neither hyperventilation or photic stimulation were performed.  EKG was monitored and noted to be sinus rhythym  with an average heart rate of 84 bpm.  No epileptiform changes were noted.  Impression: This is a normal EEG in the awake, drowsy, and sleep states without any epileptiform abnormalities noted.  A single normal EEG does not exclude the diagnosis of epilepsy.  Clinical correlation advised.   Carvel Getting, M.D. Neurology Cell [phone]
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
## 2017-06-13 NOTE — Progress Notes (Signed)
PROGRESS NOTE    ARYAH DOERING  KNL:[id number] DOB: [birth year] DOA: 06/12/2017 PCP: [name], MD    Brief Narrative:  67 year old female who presented with seizures.  She does have the significant past medical history for hypertension, dyslipidemia, GERD and history of right CVA with left-sided hemiparesis.  Patient reports two epileptic events, the first 1happen 2 weeks ago, consistent with urinary incontinence, tonic-clonic movement and dysarthria.  She had second episode the day of admission, witnessed by her family, lasting 3 to 5 minutes, with imilar characteristics.  On the initial physical examination blood pressure 121/83, heart rate 86, temperature 98.9, respiratory rate 15, oxygen saturation 92%.  Moist mucous membranes, lungs clear to auscultation bilaterally, heart S1-S2 present rhythmic, the abdomen soft nontender, no lower extremity edema, patient was awake and alert, left hemiparesis.  Sodium 141, potassium 3.9, chloride 106, bicarb 27, glucose 112, BUN 9, creatinine 0.77, white count 4.3, hemoglobin 11.6, hematocrit 36.3, platelets 226.  Head CT with no acute changes, chronic right frontotemporal subdural hygroma, arachynoid cyst at the anterior aspect right middle lobe cranial fossa with mass-effect upon the anterior aspect of the right temporal lobe.  Chest x-ray negative for infiltrates, increased lung markings bilaterally.  EKG sinus rhythm, normal axis, normal intervals.  Patient was admitted to the hospital with working diagnosis of new onset seizures.  Assessment & Plan:   Principal Problem:   Seizures ([place]) Active Problems:   Normocytic anemia   HYPERTENSION, BENIGN ESSENTIAL   Chronic diastolic heart failure (HCC)   Cerebral infarction (HCC)   Spastic hemiplegia affecting nondominant side (HCC)   Chronic cholecystitis   GERD (gastroesophageal reflux disease)   Chronic rhinitis   Asthma   Hyperlipidemia   Seizure ([place])   1. Recurrent seizures. No further  events, will continue neuro checks q 4 hours. Had one dose of prophylactic valproate per neurology recommendations. Follow on electroencephalography. MRI with no acute changes, chronic right sided subdural hygroma with mild mass effect and right middle cranial fossa arachnoid cyst.   2. HTN. Will hold on IV fluids, will continue losartan and hctz for blood pressure control.   3. GERD. Continue pantoprazole.   4. Asthma. No signs of acute exacerbation, will continue dulera and as needed albuterol.    DVT prophylaxis: enoxaparin   Code Status: full Family Communication: I spoke with patient's daughter at the bedside and all questions were addressed.  Disposition Plan: home in am, pending neurology recommendations   Consultants:   Neurology   Procedures:     Antimicrobials:       Subjective: Patient is feeling well, no further seizures, no headaches, no nausea or vomiting.   Objective: Vitals:   06/12/17 2215 06/12/17 2309 06/13/17 0406 06/13/17 0938  BP:  124/72 (!) 107/58   Pulse:  76 72   Resp:  16    Temp:  98.4 F (36.9 C) 98.2 F (36.8 C)   TempSrc:  Oral Oral   SpO2: 99% 95% 97% 95%  Weight:      Height:        Intake/Output Summary (Last 24 hours) at 06/13/2017 1302 Last data filed at 06/13/2017 7902 Gross per 24 hour  Intake 875 ml  Output -  Net 875 ml   Filed Weights   06/12/17 2117  Weight: 109.8 kg (242 lb 1 oz)    Examination:   General: deconditioned Neurology: Awake and alert, non focal  E ENT: mild pallor, no icterus, oral mucosa moist Cardiovascular: No JVD. S1-S2  present, rhythmic, no gallops, rubs, or murmurs. No lower extremity edema. Pulmonary: decreased breath sounds bilaterally at bases, adequate air movement, no wheezing, rhonchi or rales. Gastrointestinal. Abdomen protuberant with no organomegaly, non tender, no rebound or guarding Skin. No rashes Musculoskeletal: no joint deformities     Data Reviewed: I have personally  reviewed following labs and imaging studies  CBC: Recent Labs  Lab 06/12/17 0924 06/13/17 0536  WBC 4.3 3.8*  NEUTROABS 1.3*  --   HGB 11.6* 10.5*  HCT 36.3 33.4*  MCV 89.0 89.3  PLT 226 621   Basic Metabolic Panel: Recent Labs  Lab 06/12/17 0924 06/13/17 0536  NA 141 143  K 3.9 3.7  CL 106 106  CO2 27 30  GLUCOSE 112* 96  BUN 9 8  CREATININE 0.77 0.79  CALCIUM 9.2 8.7*   GFR: Estimated Creatinine Clearance: 81.2 mL/min (by C-G formula based on SCr of 0.79 mg/dL). Liver Function Tests: Recent Labs  Lab 06/12/17 0924  AST 16  ALT 17  ALKPHOS 56  BILITOT 0.3  PROT 7.1  ALBUMIN 3.5   No results for input(s): LIPASE, AMYLASE in the last 168 hours. No results for input(s): AMMONIA in the last 168 hours. Coagulation Profile: Recent Labs  Lab 06/12/17 0924  INR 1.03   Cardiac Enzymes: No results for input(s): CKTOTAL, CKMB, CKMBINDEX, TROPONINI in the last 168 hours. BNP (last 3 results) No results for input(s): PROBNP in the last 8760 hours. HbA1C: No results for input(s): HGBA1C in the last 72 hours. CBG: No results for input(s): GLUCAP in the last 168 hours. Lipid Profile: No results for input(s): CHOL, HDL, LDLCALC, TRIG, CHOLHDL, LDLDIRECT in the last 72 hours. Thyroid Function Tests: No results for input(s): TSH, T4TOTAL, FREET4, T3FREE, THYROIDAB in the last 72 hours. Anemia Panel: No results for input(s): VITAMINB12, FOLATE, FERRITIN, TIBC, IRON, RETICCTPCT in the last 72 hours.    Radiology Studies: I have reviewed all of the imaging during this hospital visit personally     Scheduled Meds: . aspirin EC  81 mg Oral Daily  . heparin  5,000 Units Subcutaneous Q8H  . losartan  50 mg Oral Daily   And  . hydrochlorothiazide  12.5 mg Oral Daily  . mometasone-formoterol  2 puff Inhalation BID  . pantoprazole  40 mg Oral Daily   Continuous Infusions: . sodium chloride 75 mL/hr (06/13/17 0650)     LOS: 1 day        Jayton Popelka Gerome Apley, MD [facility] Pager [phone]

## 2017-06-14 MED ORDER — VALPROATE SODIUM 250 MG/5ML PO SOLN
500.0000 mg | Freq: Three times a day (TID) | ORAL | Status: DC
  Administered 2017-06-14: 500 mg via ORAL
  Filled 2017-06-14 (×2): qty 10

## 2017-06-14 MED ORDER — VALPROATE SODIUM 250 MG/5ML PO SOLN: 500.0000 mg | Freq: Three times a day (TID) | ORAL | 0 refills | Status: DC

## 2017-06-14 NOTE — Consult Note (Signed)
[facility] CM Primary Care Navigator  06/14/2017  [name] 02-Nov-1950 [id number]   Met with patientand daughter Ross Ludwig) at the bedsideto identify possible discharge needs. Patientreports having"persistent headaches"and seizure episode thathadled to this admission.(diagnosis of new onset seizures)  PatientendorsesDr. [name] [facility] asherprimary care provider.Daughter mentioned of plan to establish care with another primary care provider and awaiting to hear from their office [facility])   [facility] [place] [facility].   Patientstatesmanagingher ownmedications at [employer] out of the containers.  Patient reports thatshehas been driving prior to admissionbuther daughter or granddaughter(Ashley) will be providing transportation toherdoctors'appointmentsafter discharge.  Patient livesalone but has a church member Optometrist) who stays with her at home and looks after her. She reports that daughter calls her 2- 3 times a day to check on her needs. Patient's daughter are planning to obtain "life alert" for patient to use.  Anticipatedplan for [place] outpatient therapy perpatient.  Patient and daughter voiced understanding to follow-up with primary care provider whenshereturns home,for a post discharge follow-up appointment within1- 2 weeksor sooner if needs arise.Patient letter (with PCP's contact number) was provided asareminder.  Explained topatientand daughter regardingTHN CM services available for health managementat homebutshedeniesanyneeds or concernsat this time.  Patientand daughter expressedunderstandingto seek referral from primary care provider to [facility] care management ifnecessaryand deemed appropriate for anyservices in the future.  [facility] care management information provided for future needs thatshemay  have.  Patienthad only opted and verbally agreed forEMMIcalls tofollowup herrecoveryat home.  Referral made for [facility] General calls after discharge.   For additional questions please contact:  Edwena Felty A. Bernardino Dowell, BSN, RN-BC [facility] PRIMARY CARE Navigator Cell: [phone]

## 2017-06-14 NOTE — Progress Notes (Deleted)
Subjective: No further seizures. No complaints  Exam: Vitals:   06/14/17 0321 06/14/17 0847  BP: 131/80 (!) 164/87  Pulse: 81 97  Resp: 20 17  Temp: 98.3 F (36.8 C) 98.9 F (37.2 C)  SpO2: 95% 99%    Physical Exam  HEENT-  Normocephalic, no lesions, without obvious abnormality.  Normal external eye and conjunctiva.   Extremities- Warm, dry and intact Musculoskeletal-no joint tenderness, deformity or swelling Skin-warm and dry, no hyperpigmentation, vitiligo, or suspicious lesions  Neuro:  Mental Status: Alert, oriented, thought content appropriate.  Speech fluent without evidence of aphasia.  Cranial Nerves: II: Visual fields grossly normal,  III,IV, VI: ptosis not present, extra-ocular motions intact bilaterally pupils equal, round, reactive to light and accommodation V,VII: smile symmetric, facial light touch sensation normal bilaterally VIII: hearing normal bilaterally IX,X: uvula rises symmetrically XI: bilateral shoulder shrug XII: midline tongue extension Motor: Right : Upper extremity   5/5    Left:     Upper extremity   3/5  Lower extremity   5/5     Lower extremity   4/5 Cerebellar: normal finger-to-nose on right and unable to do F-N on the left due to weakness Gait:not tested    Medications:  Scheduled: . aspirin EC  81 mg Oral Daily  . enoxaparin (LOVENOX) injection  40 mg Subcutaneous Q24H  . losartan  50 mg Oral Daily   And  . hydrochlorothiazide  12.5 mg Oral Daily  . mometasone-formoterol  2 puff Inhalation BID  . pantoprazole  40 mg Oral Daily    Pertinent Labs/Diagnostics: EEG: This is a normal EEG in the awake, drowsy, and sleep states without any epileptiform abnormalities noted.  A single normal EEG does not exclude the diagnosis of epilepsy.  Clinical correlation advised.  MRI brain: IMPRESSION: 1. No acute intracranial abnormality. 2. Moderate chronic small vessel ischemic disease. 3. Chronic right-sided subdural hygroma with mild mass  effect. 4. Right middle cranial fossa arachnoid cyst.    Impression: New onset seizures. May be secondary to cortical irritability adjacent to the right frontoparietal subdural hygroma seen on CT. No further seizures while in VPA. Tolerating medication well.    --No further recommendations --Follow up with Dr [name] as out patient --Per [facility] statutes, patients with seizures are not allowed to drive until  they have been seizure-free for six months. Use caution when using heavy equipment or power tools. Avoid working on ladders or at heights. Take showers instead of baths. Ensure the water temperature is not too high on the home water heater. Do not go swimming alone. When caring for infants or small children, sit down when holding, feeding, or changing them to minimize risk of injury to the child in the event you have a seizure.   Also, Maintain good sleep hygiene. Avoid alcohol.  --> Call 911 and bring the patient back to the ED if:                   A.  The seizure lasts longer than 5 minutes.                  B.  The patient doesn't awaken shortly after the seizure             C.  The patient has new problems such as difficulty seeing, speaking or moving             D.  The patient was injured during the seizure  E.  The patient has a temperature over 102 F (39C)             F.  The patient vomited and now is having trouble breathing  Neurology S/O   Etta Quill PA-C [facility] [phone]  06/14/2017, 10:04 AM

## 2017-06-14 NOTE — Progress Notes (Signed)
Patient discharged home with daughter ,  IV discontinued, discharge instruction given.  Patient is comfortable at baseline upon discharge

## 2017-06-14 NOTE — Care Management Note (Signed)
Case Management Note Marvetta Gibbons RN, BSN Unit 4E-Case Manager-- 3W coverage [phone]  Patient Details  Name: [name] MRN: [id number] Date of Birth: [birth year]  Subjective/Objective:   Pt admitted with new onset seizures- hx of CVA                 Action/Plan: PTA pt lived at home- per PT/OT evals recommendation for outpt neuro rehab- verbal order received from MD for referral to outpt Neuro rehab- referral sent via epic to [facility].   Expected Discharge Date:  06/14/17               Expected Discharge Plan:  OP Rehab  In-House Referral:     Discharge planning Services  CM Consult  Post Acute Care Choice:    Choice offered to:     DME Arranged:    DME Agency:     HH Arranged:    [place] Agency:     Status of Service:  Completed, signed off  If discussed at B and E of Stay Meetings, dates discussed:    Discharge Disposition: home/self care   Additional Comments:  Dawayne Patricia, RN 06/14/2017, 2:23 PM

## 2017-06-14 NOTE — Discharge Summary (Signed)
Physician Discharge Summary  [name] KZL:[id number] DOB: 1950/10/28 DOA: 06/12/2017  PCP: [name], MD  Admit date: 06/12/2017 Discharge date: 06/14/2017  Admitted From: Home Disposition:  Home  Recommendations for Outpatient Follow-up and new medication changes:  1. Follow up with PCP in 1- weeks 2. Follow up with Dr [name] in 2 weeks 3. Patient has been placed on Valproic Acid 500 mg tid for seizure prophylaxis.   Home Health: no   Equipment/Devices: no    Discharge Condition: stable  CODE STATUS: full  Diet recommendation: Heart healthy  Brief/Interim Summary: 67 year old female who presented with seizures.  She does have the significant past medical history for hypertension, dyslipidemia, GERD and history of right CVA with left-sided hemiparesis.  Patient reports two epileptic events, the first one happen 2 weeks ago, consistent with urinary incontinence, tonic-clonic movements and dysarthria.  She had second episode the day of admission, witnessed by her family, lasting 3 to 5 minutes, with imilar characteristics.  On the initial physical examination blood pressure 121/83, heart rate 86, temperature 98.9, respiratory rate 15, oxygen saturation 92%.  Moist mucous membranes, lungs clear to auscultation bilaterally, heart S1-S2 present rhythmic, the abdomen soft nontender, no lower extremity edema, patient was awake and alert, with left hemiparesis.  Sodium 141, potassium 3.9, chloride 106, bicarb 27, glucose 112, BUN 9, creatinine 0.77, white count 4.3, hemoglobin 11.6, hematocrit 36.3, platelets 226.  Head CT with no acute changes, chronic right frontotemporal subdural hygroma, arachynoid cyst at the anterior aspect right middle lobe cranial fossa with mass-effect upon the anterior aspect of the right temporal lobe.  Chest x-ray negative for infiltrates, increased lung markings bilaterally.  EKG sinus rhythm, normal axis, normal intervals.  Patient was admitted to the hospital with  working diagnosis of new onset seizures.  1.  New onset seizures.  Patient was admitted to the medical ward, she was loaded with valproic acid intravenously with good toleration.  She had frequent neuro checks, with no further seizure activity identified.  Valproic acid was continued at 500 mg 3 times daily.  It was suspected that the seizures were secondary to cortical irritability adjacent to the right frontoparietal subdural hygroma.  Further work-up with brain MRI showed no acute intracranial abnormality, moderate chronic small vessel ischemic disease, chronic right-sided subdural hygroma with mild mass-effect, right middle cranial fossa arachnoid cyst.  Electroencephalography with no epileptiform abnormalities noted.  Patient will follow up with Dr. [name] as outpatient.   Received directions about seizure precautions including avoid driving until follow-up as an outpatient.   2.  Hypertension.  Blood pressure remained well controlled with losartan and hydrochlorothiazide.  3.  GERD.  Patient was continue on pantoprazole.  4.  Asthma.  Stable with no signs of exacerbation, patient will continue bronchodilator therapy at home.  Discharge Diagnoses:  Principal Problem:   Seizures ([place]) Active Problems:   Normocytic anemia   HYPERTENSION, BENIGN ESSENTIAL   Chronic diastolic heart failure (HCC)   Cerebral infarction (HCC)   Spastic hemiplegia affecting nondominant side (HCC)   Chronic cholecystitis   GERD (gastroesophageal reflux disease)   Chronic rhinitis   Asthma   Hyperlipidemia   Seizure ([place])    Discharge Instructions   Allergies as of 06/14/2017   No Known Allergies     Medication List    STOP taking these medications   metoCLOPramide 5 MG tablet Commonly known as:  REGLAN   naproxen 500 MG tablet Commonly known as:  NAPROSYN   oxyCODONE-acetaminophen 5-325 MG  tablet Commonly known as:  PERCOCET/ROXICET     TAKE these medications   albuterol 108 (90 Base)  MCG/ACT inhaler Commonly known as:  PROVENTIL HFA;VENTOLIN HFA Inhale 2 puffs into the lungs every 6 (six) hours as needed for wheezing or shortness of breath.   aspirin EC 81 MG tablet Take 81 mg by mouth daily.   atorvastatin 40 MG tablet Commonly known as:  LIPITOR Take 1 tablet (40 mg total) by mouth daily at 6 PM.   Azelastine HCl 0.15 % Soln 1 spray in each nostril 1-2 times daily as needed. What changed:    how much to take  how to take this  when to take this  reasons to take this  additional instructions   budesonide-formoterol 160-4.5 MCG/ACT inhaler Commonly known as:  SYMBICORT Inhale 2 puffs into the lungs 2 (two) times daily.   Dexlansoprazole 30 MG capsule Take 1 capsule (30 mg total) by mouth daily.   etodolac 400 MG tablet Commonly known as:  LODINE Take 400 mg by mouth 2 (two) times daily as needed for pain.   FLUTTER Devi Use as directed   losartan-hydrochlorothiazide 50-12.5 MG tablet Commonly known as:  HYZAAR Take 1 tablet by mouth at bedtime.   ranitidine 300 MG capsule Commonly known as:  ZANTAC Take 1 capsule (300 mg total) by mouth every evening.   Valproate Sodium 250 MG/5ML Soln solution Commonly known as:  DEPAKENE Take 10 mLs (500 mg total) by mouth every 8 (eight) hours.       No Known Allergies  Consultations:  Neurology    Procedures/Studies: Dg Chest 2 View  Result Date: 06/12/2017 CLINICAL DATA:  Spasms of coughing. History of asthma, previous CVA, former smoker. EXAM: CHEST - 2 VIEW COMPARISON:  Chest x-ray of November 18 2016 FINDINGS: The lungs are adequately inflated. There is no focal infiltrate. There is no pleural effusion. The interstitial markings are coarse. The heart and pulmonary vascularity are normal. The mediastinum is normal in width. The trachea is midline. The bony thorax exhibits no acute abnormality. IMPRESSION: Mild chronic interstitial prominence likely reflects the known reactive airway disease and  smoking history. No alveolar pneumonia nor CHF. Electronically Signed   By: David  Martinique M.D.   On: 06/12/2017 13:58   Ct Head Wo Contrast  Result Date: 06/12/2017 CLINICAL DATA:  Generalized headache for 1 month, history hypertension, hyperlipidemia, asthma, stroke, former smoker EXAM: CT HEAD WITHOUT CONTRAST TECHNIQUE: Contiguous axial images were obtained from the base of the skull through the vertex without intravenous contrast. Sagittal and coronal MPR images reconstructed from axial data set. COMPARISON:  02/27/2016 FINDINGS: Brain: Normal ventricular morphology. CSF attenuation collection again identified at the anterior aspect of the RIGHT middle cranial fossa, 3.9 x 2.5 x 4.2 cm in size consistent with an arachnoid cyst, grossly stable. Mass effect upon the anterior aspect of the RIGHT temporal lobe appears unchanged. Additional low-attenuation collection within the RIGHT frontoparietal subdural space consistent with chronic hygroma measuring up to 9 mm thick previously 10 mm. 2 mm of RIGHT to LEFT midline shift unchanged. Minimal small vessel chronic ischemic changes of deep cerebral white matter. No acute intracranial hemorrhage, mass lesion or evidence of acute infarction. Posterior fossa unremarkable. Vascular: Atherosclerotic calcifications of the internal carotid arteries bilaterally at skull base. Skull: Intact Sinuses/Orbits: Visualized paranasal sinuses and mastoid air cells clear Other: N/A IMPRESSION: Chronic low-attenuation RIGHT frontoparietal subdural hygroma 9 mm thick, previously 10 mm. Arachnoid cyst at anterior aspect RIGHT middle cranial  fossa 3.9 x 2.5 x 4.2 cm with mass effect upon the anterior aspect of the RIGHT temporal lobe. 2 mm RIGHT to LEFT midline shift unchanged. Minimal small vessel chronic ischemic changes of deep cerebral white matter. No acute intracranial abnormalities. Electronically Signed   By: Lavonia Dana M.D.   On: 06/12/2017 10:06   Mr Brain W And Wo  Contrast  Result Date: 06/12/2017 CLINICAL DATA:  Intermittent severe headaches for 1 month. Syncopal episodes and possible seizure. Prior stroke. EXAM: MRI HEAD WITHOUT AND WITH CONTRAST TECHNIQUE: Multiplanar, multiecho pulse sequences of the brain and surrounding structures were obtained without and with intravenous contrast. CONTRAST:  57mL MULTIHANCE GADOBENATE DIMEGLUMINE 529 MG/ML IV SOLN COMPARISON:  Head CT 06/12/2017 and MRI 08/23/2013 FINDINGS: The patient terminated the examination prematurely, and the coronal T1 postcontrast sequence is incomplete. Brain: There is no acute infarct. 3 cm arachnoid cyst in the right middle cranial fossa is unchanged from the prior MRI. Subdural fluid collection over the right cerebral convexity measures up to 10 mm in thickness and follows CSF signal intensity on all pulse sequences consistent with a hygroma, unchanged from today's earlier CT though new from 2015. There is associated mild right cerebral hemispheric sulcal effacement and partial right lateral ventricle effacement with 3 mm of leftward midline shift. Patchy cerebral white matter T2 hyperintensities have mildly progressed from 2015 and are nonspecific but compatible with moderate chronic small vessel ischemic disease. There is a chronic infarct involving the posterior right corona radiata and posterior lentiform nucleus, acute on the prior MRI. A small developmental venous anomaly is incidentally noted in the left cerebellar hemisphere. No abnormal enhancement is identified elsewhere. The hippocampi are symmetric in size and signal. Vascular: Major intracranial vascular flow voids are preserved. Skull and upper cervical spine: Unremarkable bone marrow signal. Sinuses/Orbits: Unremarkable orbits. No significant paranasal sinus disease. Clear mastoid air cells. Other: None. IMPRESSION: 1. No acute intracranial abnormality. 2. Moderate chronic small vessel ischemic disease. 3. Chronic right-sided subdural  hygroma with mild mass effect. 4. Right middle cranial fossa arachnoid cyst. Electronically Signed   By: Logan Bores M.D.   On: 06/12/2017 17:29       Subjective: Patient is feeling better, no further seizures, no chest pain or dyspnea, no nausea or vomiting, tolerating po well.   Discharge Exam: Vitals:   06/14/17 0321 06/14/17 0847  BP: 131/80 (!) 164/87  Pulse: 81 97  Resp: 20 17  Temp: 98.3 F (36.8 C) 98.9 F (37.2 C)  SpO2: 95% 99%   Vitals:   06/13/17 2336 06/14/17 0321 06/14/17 0518 06/14/17 0847  BP: 120/73 131/80  (!) 164/87  Pulse: 86 81  97  Resp: 18 20  17   Temp: 98.3 F (36.8 C) 98.3 F (36.8 C)  98.9 F (37.2 C)  TempSrc: Oral Oral  Oral  SpO2: 97% 95%  99%  Weight:   111.9 kg (246 lb 11.1 oz)   Height:        General: Not in pain or dyspnea  Neurology: Awake and alert, chronic right sided weakness.  E ENT: no pallor, no icterus, oral mucosa moist Cardiovascular: No JVD. S1-S2 present, rhythmic, no gallops, rubs, or murmurs. No lower extremity edema. Pulmonary: vesicular breath sounds bilaterally, adequate air movement, no wheezing, rhonchi or rales. Gastrointestinal. Abdomen with no organomegaly, non tender, no rebound or guarding Skin. No rashes Musculoskeletal: no joint deformities   The results of significant diagnostics from this hospitalization (including imaging, microbiology, ancillary and laboratory) are  listed below for reference.     Microbiology: No results found for this or any previous visit (from the past 240 hour(s)).   Labs: BNP (last 3 results) No results for input(s): BNP in the last 8760 hours. Basic Metabolic Panel: Recent Labs  Lab 06/12/17 0924 06/13/17 0536  NA 141 143  K 3.9 3.7  CL 106 106  CO2 27 30  GLUCOSE 112* 96  BUN 9 8  CREATININE 0.77 0.79  CALCIUM 9.2 8.7*   Liver Function Tests: Recent Labs  Lab 06/12/17 0924  AST 16  ALT 17  ALKPHOS 56  BILITOT 0.3  PROT 7.1  ALBUMIN 3.5   No results  for input(s): LIPASE, AMYLASE in the last 168 hours. No results for input(s): AMMONIA in the last 168 hours. CBC: Recent Labs  Lab 06/12/17 0924 06/13/17 0536  WBC 4.3 3.8*  NEUTROABS 1.3*  --   HGB 11.6* 10.5*  HCT 36.3 33.4*  MCV 89.0 89.3  PLT 226 189   Cardiac Enzymes: No results for input(s): CKTOTAL, CKMB, CKMBINDEX, TROPONINI in the last 168 hours. BNP: Invalid input(s): POCBNP CBG: No results for input(s): GLUCAP in the last 168 hours. D-Dimer No results for input(s): DDIMER in the last 72 hours. Hgb A1c No results for input(s): HGBA1C in the last 72 hours. Lipid Profile No results for input(s): CHOL, HDL, LDLCALC, TRIG, CHOLHDL, LDLDIRECT in the last 72 hours. Thyroid function studies No results for input(s): TSH, T4TOTAL, T3FREE, THYROIDAB in the last 72 hours.  Invalid input(s): FREET3 Anemia work up No results for input(s): VITAMINB12, FOLATE, FERRITIN, TIBC, IRON, RETICCTPCT in the last 72 hours. Urinalysis    Component Value Date/Time   COLORURINE STRAW (A) 09/27/2016 2020   APPEARANCEUR CLEAR 09/27/2016 2020   LABSPEC 1.004 (L) 09/27/2016 2020   PHURINE 6.0 09/27/2016 2020   GLUCOSEU NEGATIVE 09/27/2016 2020   HGBUR NEGATIVE 09/27/2016 2020   HGBUR trace-intact 03/28/2010 1550   BILIRUBINUR NEGATIVE 09/27/2016 2020   KETONESUR NEGATIVE 09/27/2016 2020   PROTEINUR NEGATIVE 09/27/2016 2020   UROBILINOGEN 0.2 08/24/2013 0428   NITRITE NEGATIVE 09/27/2016 2020   LEUKOCYTESUR NEGATIVE 09/27/2016 2020   Sepsis Labs Invalid input(s): PROCALCITONIN,  WBC,  LACTICIDVEN Microbiology No results found for this or any previous visit (from the past 240 hour(s)).   Time coordinating discharge: 45 minutes  SIGNED:   Tawni Millers, MD  [facility] 06/14/2017, 10:42 AM Pager [phone]  If 7PM-7AM, please contact night-coverage [email or web address] Password [email or web address]

## 2017-06-14 NOTE — Progress Notes (Signed)
Physical Therapy Treatment Patient Details Name: [name] MRN: [id number] DOB: 1951-01-02 Today's Date: 06/14/2017    History of Present Illness 67 yo female with medical history significant for hypertension, hyperlipidemia, GERD, and a history of right CVA, with left-sided weakness, presenting to the emergency department with right-sided headaches, as well as 2 seizure episodes, for which she did not seek medical attention. CT scan shows an arachnoid cyst MRI revealed no acute abnormalities however chronic right-sided subdural hygroma with mild mass effect and Right middle cranial fossa arachnoid cyst.    PT Comments    Pt progressing well with mobility, she ambulated 200' with a cane, with no loss of balance. Pt has chronic L foot drop from prior CVA, she has an AFO at home but rarely wears it due to difficulty donning it. She reports she's back to baseline with mobility. From PT standpoint, she is ready to DC home.    Follow Up Recommendations  Outpatient PT;Supervision - Intermittent(Outpatient Neuro PT)     Equipment Recommendations  None recommended by PT    Recommendations for Other Services       Precautions / Restrictions Precautions Precautions: Fall Precaution Comments: pt reports 2 falls in past 1 year Restrictions Weight Bearing Restrictions: No    Mobility  Bed Mobility               General bed mobility comments: up in recliner  Transfers Overall transfer level: Modified independent Equipment used: Straight cane Transfers: Sit to/from Stand Sit to Stand: Supervision;Modified independent (Device/Increase time)         General transfer comment: steady with cane, stood from recliner  Ambulation/Gait Ambulation/Gait assistance: Supervision Ambulation Distance (Feet): 200 Feet   Gait Pattern/deviations: Step-through pattern;Decreased dorsiflexion - left;Decreased weight shift to left Gait velocity: WFL    General Gait Details: pt has chronic L  foot drop 2* prior CVA, has an AFO but finds it difficult to don at home so rarely wears it, no loss of balance, no dyspnea, HR 110s walking   Stairs Stairs: Yes(pt declined stair training, stated she has 2 rails she can reach for her 3 steps to enter, stated she doesn't need to practice stairs)           Wheelchair Mobility    Modified Rankin (Stroke Patients Only)       Balance Overall balance assessment: Needs assistance Sitting-balance support: No upper extremity supported;Feet supported Sitting balance-Leahy Scale: Good Sitting balance - Comments: able to reach to floor to pick up napkin    Standing balance support: Single extremity supported;No upper extremity supported;During functional activity Standing balance-Leahy Scale: Fair Standing balance comment: requires single UE support for dynamic balance                            Cognition Arousal/Alertness: Awake/alert Behavior During Therapy: WFL for tasks assessed/performed Overall Cognitive Status: Within Functional Limits for tasks assessed                                        Exercises      General Comments        Pertinent Vitals/Pain Pain Assessment: No/denies pain    Home Living                      Prior Function  PT Goals (current goals can now be found in the care plan section) Acute Rehab PT Goals Patient Stated Goal: flower gardening PT Goal Formulation: With patient Time For Goal Achievement: 06/27/17 Potential to Achieve Goals: Fair Progress towards PT goals: Progressing toward goals    Frequency    Min 3X/week      PT Plan Current plan remains appropriate    Co-evaluation              AM-PAC PT "6 Clicks" Daily Activity  Outcome Measure  Difficulty turning over in bed (including adjusting bedclothes, sheets and blankets)?: None Difficulty moving from lying on back to sitting on the side of the bed? : A  Little Difficulty sitting down on and standing up from a chair with arms (e.g., wheelchair, bedside commode, etc,.)?: A Little Help needed moving to and from a bed to chair (including a wheelchair)?: A Little Help needed walking in hospital room?: A Little Help needed climbing 3-5 steps with a railing? : A Little 6 Click Score: 19    End of Session Equipment Utilized During Treatment: Gait belt Activity Tolerance: Patient tolerated treatment well Patient left: in chair Nurse Communication: Mobility status PT Visit Diagnosis: Unsteadiness on feet (R26.81);Other abnormalities of gait and mobility (R26.89);Muscle weakness (generalized) (M62.81);Difficulty in walking, not elsewhere classified (R26.2);Hemiplegia and hemiparesis Hemiplegia - Right/Left: Left Hemiplegia - dominant/non-dominant: Non-dominant Hemiplegia - caused by: Unspecified     Time: 1660-6301 PT Time Calculation (min) (ACUTE ONLY): 16 min  Charges:  $Gait Training: 8-22 mins                    G Codes:          Philomena Doheny 06/14/2017, 10:07 AM [phone]

## 2017-06-14 NOTE — Progress Notes (Addendum)
Subjective: No further seizures. No complaints  Exam: Vitals:   06/14/17 0321 06/14/17 0847  BP: 131/80 (!) 164/87  Pulse: 81 97  Resp: 20 17  Temp: 98.3 F (36.8 C) 98.9 F (37.2 C)  SpO2: 95% 99%    Physical Exam  HEENT-  Normocephalic, no lesions, without obvious abnormality.  Normal external eye and conjunctiva.   Extremities- Warm, dry and intact Musculoskeletal-no joint tenderness, deformity or swelling Skin-warm and dry, no hyperpigmentation, vitiligo, or suspicious lesions  Neuro:  Mental Status: Alert, oriented, thought content appropriate.  Speech fluent without evidence of aphasia.  Cranial Nerves: II: Visual fields grossly normal,  III,IV, VI: ptosis not present, extra-ocular motions intact bilaterally pupils equal, round, reactive to light and accommodation V,VII: smile symmetric, facial light touch sensation normal bilaterally VIII: hearing normal bilaterally IX,X: uvula rises symmetrically XI: bilateral shoulder shrug XII: midline tongue extension Motor: Right : Upper extremity   5/5    Left:     Upper extremity   3/5  Lower extremity   5/5     Lower extremity   4/5 Cerebellar: normal finger-to-nose on right and unable to do F-N on the left due to weakness Gait:not tested    Medications:  Scheduled: . aspirin EC  81 mg Oral Daily  . enoxaparin (LOVENOX) injection  40 mg Subcutaneous Q24H  . losartan  50 mg Oral Daily   And  . hydrochlorothiazide  12.5 mg Oral Daily  . mometasone-formoterol  2 puff Inhalation BID  . pantoprazole  40 mg Oral Daily    Pertinent Labs/Diagnostics: EEG: This is a normal EEG in the awake, drowsy, and sleep states without any epileptiform abnormalities noted.  A single normal EEG does not exclude the diagnosis of epilepsy.  Clinical correlation advised.  MRI brain: IMPRESSION: 1. No acute intracranial abnormality. 2. Moderate chronic small vessel ischemic disease. 3. Chronic right-sided subdural hygroma with mild mass  effect. 4. Right middle cranial fossa arachnoid cyst.   Assessment: New onset seizures. May be secondary to cortical irritability adjacent to the right frontoparietal subdural hygroma seen on CT. No further seizures while on VPA. Tolerating medication well.   Recommendations: --No further recommendations --Follow up with Dr [name] as outpatient --Per [facility] statutes, patients with seizures are not allowed to drive until  they have been seizure-free for six months. Use caution when using heavy equipment or power tools. Avoid working on ladders or at heights. Take showers instead of baths. Ensure the water temperature is not too high on the home water heater. Do not go swimming alone. When caring for infants or small children, sit down when holding, feeding, or changing them to minimize risk of injury to the child in the event you have a seizure. Also, Maintain good sleep hygiene. Avoid alcohol. --Neurology will sign off. Please call if there are additional questions.    Etta Quill PA-C, [facility], [phone]  Electronically signed: Dr. Kerney Elbe 06/14/2017, 10:04 AM

## 2017-06-15 ENCOUNTER — Encounter: Payer: Self-pay | Admitting: Neurology

## 2017-06-15 NOTE — Progress Notes (Signed)
Patient called to have her prescription transferred to [facility] pharmacy, I called her pharmacy to have her prescription transferred to her current pharmacy. I called Mrs. Snethen and informed her about the change. All questions were addressed.

## 2017-07-16 ENCOUNTER — Encounter: Payer: Self-pay | Admitting: Occupational Therapy

## 2017-07-16 ENCOUNTER — Encounter: Payer: Self-pay | Admitting: Physical Therapy

## 2017-07-16 ENCOUNTER — Other Ambulatory Visit: Payer: Self-pay

## 2017-07-16 ENCOUNTER — Ambulatory Visit: Payer: Medicare HMO | Attending: Internal Medicine | Admitting: Physical Therapy

## 2017-07-16 ENCOUNTER — Ambulatory Visit: Payer: Medicare HMO | Admitting: Occupational Therapy

## 2017-07-16 DIAGNOSIS — M25612 Stiffness of left shoulder, not elsewhere classified: Secondary | ICD-10-CM | POA: Diagnosis not present

## 2017-07-16 DIAGNOSIS — R2681 Unsteadiness on feet: Secondary | ICD-10-CM | POA: Diagnosis not present

## 2017-07-16 DIAGNOSIS — M6281 Muscle weakness (generalized): Secondary | ICD-10-CM

## 2017-07-16 DIAGNOSIS — M545 Low back pain, unspecified: Secondary | ICD-10-CM

## 2017-07-16 DIAGNOSIS — G8929 Other chronic pain: Secondary | ICD-10-CM | POA: Diagnosis not present

## 2017-07-16 DIAGNOSIS — M25512 Pain in left shoulder: Secondary | ICD-10-CM | POA: Diagnosis not present

## 2017-07-16 DIAGNOSIS — I69354 Hemiplegia and hemiparesis following cerebral infarction affecting left non-dominant side: Secondary | ICD-10-CM | POA: Diagnosis not present

## 2017-07-16 DIAGNOSIS — R261 Paralytic gait: Secondary | ICD-10-CM | POA: Insufficient documentation

## 2017-07-16 NOTE — Therapy (Signed)
[facility] [address] [place], [place], [address] Phone: [phone]   Fax:  [phone]  Occupational Therapy Evaluation  Patient Details  Name: [name] MRN: [id number] Date of Birth: 1950-11-19 Referring Provider: [name], MD   Encounter Date: 07/16/2017  OT End of Session - 07/16/17 1639    Visit Number  1    Number of Visits  8    Date for OT Re-Evaluation  08/13/17    Authorization Type  medicare    OT Start Time  1147    OT Stop Time  1228    OT Time Calculation (min)  41 min    Activity Tolerance  Patient tolerated treatment well       Past Medical History:  Diagnosis Date  . Arthritis   . Asthma   . Difficulty sleeping   . Gallstones   . Hyperlipidemia   . Hypertension   . Preoperative clearance   . Spasmodic cough    "IT COMES IN DIFFERENT SEASONS"  . Stroke ([place]) 2015   WEAKNESS L SIDE OF BODY     Past Surgical History:  Procedure Laterality Date  . CHOLECYSTECTOMY N/A 04/12/2015   Procedure: LAPAROSCOPIC CHOLECYSTECTOMY WITH INTRAOPERATIVE CHOLANGIOGRAM;  Surgeon: Greer Pickerel, MD;  Location: [facility];  Service: General;  Laterality: N/A;  . KNEE ARTHROSCOPY  2010    There were no vitals filed for this visit.  Subjective Assessment - 07/16/17 1155    Subjective   I get a heavy feeling when I try and do my exercises.      Pertinent History  Pt with history of CVA in 2015 with residual L hemiparesis;  See prior notes.  Pt has seizure 02/13/2017 then had one in March and went to doctor (not hospital).      Patient Stated Goals  My therapist in the hospital told me I needed it .     Currently in Pain?  Yes    Pain Score  2     Pain Location  Back    Pain Orientation  Left;Posterior;Lower    Pain Descriptors / Indicators  Tightness;Contraction    Pain Type  Chronic pain    Pain Onset  More than a month ago    Pain Frequency  Intermittent    Aggravating Factors   standing too long,  walk too far, late in the day    Pain Relieving Factors  first thin in the morning, sit to rest in flexed position        [facility] OT Assessment - 07/16/17 1200      Assessment   Medical Diagnosis  Seizure, hx of old R CVA    Referring Provider  [name], MD    Onset Date/Surgical Date  06/14/17 date of OT referral    Prior Therapy  pt had extensive inpt rehab, then outpatient therapy at this center for 29 visits for OT      Precautions   Precautions  Fall    Precaution Comments  seizure hx      Restrictions   Weight Bearing Restrictions  No      Balance Screen   Has the patient fallen in the past 6 months  No      Home  Environment   Family/patient expects to be discharged to:  Private residence    Living Arrangements  -- pt has friend living with her but doesn't help pt      Prior Function  Level of Independence  Independent with basic ADLs;Independent with household mobility without device    Leisure  garden flowers      ADL   Eating/Feeding  Modified independent    Grooming  Modified independent    Upper Body Bathing  Modified independent    Lower Body Bathing  Modified independent    Upper Body Dressing  Increased time needs assist with AFO so doesn't wear it    Lower Body Dressing  Modified independent    Toilet Transfer  Modified independent    Toileting - Clothing Manipulation  Modified independent    Toileting -  Hygiene  Modified Independent    Tub/Shower Transfer  Modified independent    ADL comments  pt has transfer tub bench and uses hand held shower.  Pt has no equipment around the toilet      IADL   Shopping  Needs to be accompanied on any shopping trip pt is on driving restriction but can shop [place] alone or with occasional assistance    Meal Prep  Plans, prepares and serves adequate meals independently    [place] on family or friends for transportation restricted due to seizures for 6 month but  was driving    Medication Management  Is responsible for taking medication in correct dosages at correct time    Financial Management  Requires assistance dtr assists and has been since 2015      Mobility   Mobility Status  Independent      Written Expression   Dominant Hand  Right      Vision - History   Baseline Vision  Wears glasses only for reading    Additional Comments  pt denies visual changes since seizure      Activity Tolerance   Activity Tolerance  Tolerate 30+ min activity without fatigue      Cognition   Overall Cognitive Status  History of cognitive impairments - at baseline      Sensation   Light Touch  Appears Intact    Hot/Cold  Appears Intact    Proprioception  Appears Intact      Coordination   Gross Motor Movements are Fluid and Coordinated  No    Fine Motor Movements are Fluid and Coordinated  No      Edema   Edema  mild edema - pt reports it doesn't really fluctuate.       Tone   Assessment Location  Left Upper Extremity      ROM / Strength   AROM / PROM / Strength  AROM;PROM;Strength      AROM   Overall AROM   Deficits    Overall AROM Comments  R shoulder flexion 60*, abduction 90*, elbow flexion/extension WFL's, IR WFL's, ER - 15*, wrist extension and flexion half range of each, full finger flexion and extension.        PROM   Overall PROM   Deficits    Overall PROM Comments  L shoulder flexion to 90* however then stretch pain of 8/10, abduction to 90* no pain, wrist extenion and flexion half range, ER WFL's, supination 75%.       Strength   Overall Strength  Deficits    Overall Strength Comments  unable to assess with MMT as pt has altered tone however no greater than 2+/5 .      Hand Function   Right Hand Gross Grasp  Functional  Right Hand Grip (lbs)  40    Left Hand Gross Grasp  Impaired    Left Hand Grip (lbs)  2      LUE Tone   LUE Tone  Hypertonic;Modified Ashworth      LUE Tone   Modified Ashworth Scale for Grading  Hypertonia LUE  Slight increase in muscle tone, manifested by a catch and release or by minimal resistance at the end of the range of motion when the affected part(s) is moved in flexion or extension                           OT Long Term Goals - 07/16/17 1630      OT LONG TERM GOAL #1   Title  Pt will be mod I with upgraded HEP - 08/13/2017    Status  New      OT LONG TERM GOAL #2   Title  Pt will demonstrate ability for self ROM to 90* of flexion with no more than 3/10 pain with LUE    Status  New      OT LONG TERM GOAL #3   Title  Pt will don AFO with mod I.     Status  New            Plan - 07/16/17 1632    Clinical Impression Statement  Pt is a 67 year old female s/p seizures (one in January of 2019 and one in March 2019).  Pt also has history of R CVA.  Pt presents today with pain in L shoulder, decreased PROM to allow for easy self care, and inability to don her AFO (pt was living with some one prior to this who was assisting pt and no longer lives with this person so pt does not wear AFO currently).  Pt will benefit from skilled OT to address these areas to maximize HEP for LUE, reduce pain and explore strategies for donning AFO mod I.  Pt in agreement.     Occupational Profile and client history currently impacting functional performance  PMH:  history of B CVA's, HTN, HLD, GERn, chronic subdural hygroma.    Occupational performance deficits (Please refer to evaluation for details):  ADL's;IADL's    Rehab Potential  Fair    Current Impairments/barriers affecting progress:  length of time since initial onset    OT Frequency  2x / week    OT Duration  4 weeks    OT Treatment/Interventions  Self-care/ADL training;Neuromuscular education;Therapeutic exercise;Manual Therapy;Passive range of motion;Therapeutic activities;Patient/family education    Plan  manual therapy for LUE for shoulder flexion, initiate HEP as able, address donning AFO    Clinical Decision  Making  Limited treatment options, no task modification necessary    Consulted and Agree with Plan of Care  Patient       Patient will benefit from skilled therapeutic intervention in order to improve the following deficits and impairments:  Decreased range of motion, Decreased strength, Increased edema, Impaired tone, Pain  Visit Diagnosis: Hemiplegia and hemiparesis following cerebral infarction affecting left non-dominant side ([place]) - Plan: Ot plan of care cert/re-cert  Stiffness of left shoulder, not elsewhere classified - Plan: Ot plan of care cert/re-cert  Chronic left shoulder pain - Plan: Ot plan of care cert/re-cert  Unsteadiness on feet - Plan: Ot plan of care cert/re-cert    Problem List Patient Active Problem List   Diagnosis Date Noted  . Asthma 06/12/2017  .  Hyperlipidemia 06/12/2017  . Seizures ([place]) 06/12/2017  . Seizure ([place]) 06/12/2017  . GERD (gastroesophageal reflux disease) 10/31/2016  . Chronic rhinitis 10/31/2016  . Chronic cholecystitis 04/12/2015  . Preoperative clearance 03/17/2015  . Spastic hemiplegia affecting nondominant side ([place]) 10/27/2013  . Adhesive capsulitis of right shoulder 10/27/2013  . Chronic diastolic heart failure ([place]) 08/26/2013  . Dyslipidemia 08/26/2013  . Borderline diabetes 08/26/2013  . Cerebral infarction ([place]) 08/26/2013  . Acute CVA (cerebrovascular accident) ([place]) 08/23/2013  . Pre-syncope 08/17/2012  . Hypokalemia 08/17/2012  . ARTHRITIS 03/28/2010  . DE QUERVAIN'S TENOSYNOVITIS 03/28/2010  . Normocytic anemia 07/26/2009  . Moderate persistent asthma 03/09/2009  . DENTAL CARIES 03/09/2009  . CANDIDIASIS OF VULVA AND VAGINA 06/04/2008  . SKIN TAG 06/04/2008  . ARTHRITIS, KNEES, BILATERAL 05/22/2008  . HYPERTENSION, BENIGN ESSENTIAL 05/04/2008  . KNEE PAIN, BILATERAL 05/04/2008  . Cough, persistent 05/04/2008  . MENISCUS TEAR, RIGHT 12/07/2005    Quay Burow, OTR/L 07/16/2017, 4:41 PM  [facility] [address] [place], [place], [address] Phone: [phone]   Fax:  [phone]  Name: [name] MRN: [id number] Date of Birth: [birth year]

## 2017-07-16 NOTE — Therapy (Signed)
[facility] [address] [place], [place], [address] Phone: [phone]   Fax:  [phone]  Physical Therapy Evaluation  Patient Details  Name: [name] MRN: [id number] Date of Birth: 09-20-1950 Referring Provider: [name], MD   Encounter Date: 07/16/2017  PT End of Session - 07/16/17 1217    Visit Number  1    Number of Visits  12    Authorization Type  [employer]    Authorization Time Period  $40 co-pay, (1 co-pay if OT & PT same day), oop $3400, met $120,  Visit limit:  MN    PT Start Time  1100    PT Stop Time  1144    PT Time Calculation (min)  44 min    Equipment Utilized During Treatment  Gait belt    Activity Tolerance  Patient tolerated treatment well;Patient limited by pain    Behavior During Therapy  [facility] for tasks assessed/performed       Past Medical History:  Diagnosis Date  . Arthritis   . Asthma   . Difficulty sleeping   . Gallstones   . Hyperlipidemia   . Hypertension   . Preoperative clearance   . Spasmodic cough    "IT COMES IN DIFFERENT SEASONS"  . Stroke ([place]) 2015   WEAKNESS L SIDE OF BODY     Past Surgical History:  Procedure Laterality Date  . CHOLECYSTECTOMY N/A 04/12/2015   Procedure: LAPAROSCOPIC CHOLECYSTECTOMY WITH INTRAOPERATIVE CHOLANGIOGRAM;  Surgeon: Greer Pickerel, MD;  Location: [facility];  Service: General;  Laterality: N/A;  . KNEE ARTHROSCOPY  2010    There were no vitals filed for this visit.   Subjective Assessment - 07/16/17 1110    Subjective  new onset of seizures. She had first seizure early March and then another late April 2019. She was admitted 06/12/17 - 06/14/17.    Pertinent History  R CVA, chronic right frontotemperal subdural hygroma & right middle cranial fossa arachnoid cyst,, HTN, GERD,     Limitations  Standing;Walking;House hold activities    Patient Stated Goals  to walk better, help back pain so can move more    Currently in Pain?  Yes     Pain Score  2  in last week, worst 10/10, best 0/10    Pain Location  Back    Pain Orientation  Left;Posterior;Lower    Pain Descriptors / Indicators  Tightness;Contraction    Pain Type  Chronic pain    Pain Onset  More than a month ago    Pain Frequency  Intermittent    Aggravating Factors   standing too long, walking too far, late in day    Pain Relieving Factors  first thing in more, sit to rest in flexed position.          [facility] PT Assessment - 07/16/17 1100      Assessment   Medical Diagnosis  Seizure, hx of old R CVA    Referring Provider  [name], MD    Onset Date/Surgical Date  06/14/17 PT referral    Hand Dominance  Right    Prior Therapy  21 visits 09/19/13- 12/12/13      Precautions   Precautions  Fall    Precaution Comments  seizure hx      Balance Screen   Has the patient fallen in the past 6 months  No    Has the patient had a decrease in activity level because of a fear of  falling?   Yes    Is the patient reluctant to leave their home because of a fear of falling?   Yes      [place] residence    Living Arrangements  Non-relatives/Friends church friend temporarily living with her    Type of [place] to enter    Entrance Stairs-Number of Steps  1 + 2    Entrance Stairs-Rails  Right;Left;None none at single step, 2 rails at 2 steps to [place] - single point;Tub bench;Grab bars - tub/shower;Hand held shower head      Prior Function   Level of Independence  Independent;Independent with community mobility with device;Independent with household mobility without device    Vocation  On disability;Retired    Leisure  garden flowers,       Posture/Postural Control   Posture/Postural Control  Postural limitations    Postural Limitations  Rounded Shoulders;Forward head;Flexed trunk;Weight shift right      Ambulation/Gait   Ambulation/Gait  Yes     Ambulation/Gait Assistance  4: Min guard    Ambulation Distance (Feet)  200 Feet    Assistive device  Straight cane;None    Gait Pattern  Step-to pattern;Decreased arm swing - left;Decreased step length - right;Decreased stance time - left;Decreased stride length;Decreased dorsiflexion - left;Decreased weight shift to left;Left circumduction;Left hip hike;Left steppage;Left foot flat;Antalgic;Lateral hip instability;Trunk flexed;Abducted - left;Poor foot clearance - left    Ambulation Surface  Indoor;Level    Gait velocity  1.59 ft/sec    Stairs  Yes    Stairs Assistance  4: Min guard    Stair Management Technique  One rail Right;Alternating pattern;Forwards    Number of Stairs  4      Standardized Balance Assessment   Standardized Balance Assessment  Berg Balance Test;Timed Up and Go Test;Dynamic Gait Index      Berg Balance Test   Sit to Stand  Able to stand without using hands and stabilize independently    Standing Unsupported  Able to stand safely 2 minutes    Sitting with Back Unsupported but Feet Supported on Floor or Stool  Able to sit safely and securely 2 minutes    Stand to Sit  Sits safely with minimal use of hands    Transfers  Able to transfer safely, minor use of hands    Standing Unsupported with Eyes Closed  Able to stand 10 seconds safely    Standing Ubsupported with Feet Together  Able to place feet together independently and stand 1 minute safely    From Standing, Reach Forward with Outstretched Arm  Can reach confidently >25 cm (10")    From Standing Position, Pick up Object from Floor  Able to pick up shoe safely and easily    From Standing Position, Turn to Look Behind Over each Shoulder  Looks behind one side only/other side shows less weight shift    Turn 360 Degrees  Needs close supervision or verbal cueing    Standing Unsupported, Alternately Place Feet on Step/Stool  Needs assistance to keep from falling or unable to try    Standing Unsupported, One Foot in  Front  Able to take small step independently and hold 30 seconds    Standing on One Leg  Tries to lift leg/unable to hold 3 seconds but remains standing independently  Total Score  43      Dynamic Gait Index   Level Surface  Moderate Impairment    Change in Gait Speed  Moderate Impairment    Gait with Horizontal Head Turns  Moderate Impairment    Gait with Vertical Head Turns  Moderate Impairment    Gait and Pivot Turn  Moderate Impairment    Step Over Obstacle  Severe Impairment    Step Around Obstacles  Moderate Impairment    Steps  Mild Impairment    Total Score  8      Timed Up and Go Test   Normal TUG (seconds)  19.54 19.54 sec cane & 22.24sec no device                Objective measurements completed on examination: See above findings.                PT Short Term Goals - 07/16/17 1244      PT SHORT TERM GOAL #1   Title  Patient demonstrates & verbalizes understanding of initial HEP.     Time  3    Period  Weeks    Status  New    Target Date  08/03/17      PT SHORT TERM GOAL #2   Title  Patient demonstrates donning AFO with verbal cues only.     Time  3    Period  Weeks    Status  New    Target Date  08/03/17      PT SHORT TERM GOAL #3   Title  Patient ambulates with AFO & cane with head turns to scan environment safely.     Time  3    Period  Weeks    Status  New    Target Date  08/03/17        PT Long Term Goals - 07/16/17 1239      PT LONG TERM GOAL #1   Title  Patient demonstrates & verbalizes ongoing HEP to address LBP, strength, balance & flexibilty.     Time  6    Period  Weeks    Status  New    Target Date  08/24/17      PT LONG TERM GOAL #2   Title  Patient verbalizes understanding of community fitness using Silver Sneakers.     Time  6    Period  Weeks    Status  New    Target Date  08/24/17      PT LONG TERM GOAL #3   Title  Timed Up & Go with AFO with cane <15sec.     Time  6    Period  Weeks    Status  New     Target Date  08/24/17      PT LONG TERM GOAL #4   Title  Patient verbalizes fall prevention strategies including wear of AFO most of awake hours.     Time  6    Period  Weeks    Status  New    Target Date  08/24/17      PT LONG TERM GOAL #5   Title  Dynamic Gait Index with AFO & cane >/= 12/24    Time  6    Period  Weeks    Status  New    Target Date  08/24/17             Plan - 07/16/17 1220    Clinical Impression Statement  This 67yo female was [facility] her [facility] in 2015. She had 2 "seizures" over last few months and was referred to PT. She has chronic low back pain that limits actvity tolerance. She has left hemiparesis including severe foot drop. She has an AFO (did not bring it to PT evaluation) but does not wear it because unable to donne & lives alone. Her left foot drop results in steppage gait with deviations placing her at high fall risk and increases her back pain. Gait velocity 1.33f/sec with cane indicates high fall risk. Berg Balance 43/56 indicates high fall risk (Berg Balance at PT discharge 12/2013 was 40/56).  Timed Up & Go with cane 19.54sec and without device 22.24sec both indicate high fall risk. Dynamic Gait Index with cane 8/24 indicates     History and Personal Factors relevant to plan of care:  R CVA, chronic right frontotemperal subdural hygroma & right middle cranial fossa arachnoid cyst,, HTN, GERD,     Clinical Presentation  Stable    Clinical Decision Making  Low    Rehab Potential  Good    Clinical Impairments Affecting Rehab Potential  left hemiplegia with limited use of LUE, lives alone normally (currently helping out church member who is homeless but is temporary & she is unable to provide assistance)    PT Frequency  2x / week    PT Duration  6 weeks    PT Treatment/Interventions  ADLs/Self Care Home Management;DME Instruction;Gait training;Stair training;Functional mobility training;Therapeutic activities;Therapeutic  exercise;Balance training;Neuromuscular re-education;Patient/family education;Orthotic Fit/Training;Moist Heat;Cryotherapy    PT Next Visit Plan  work on donning AFO & gait with AFO /cane,  work on setting up [employer] of [employer]at [employer] educate on SFranklin Resourcesuse, HEP for low back pain    Consulted and Agree with Plan of Care  Patient       Patient will benefit from skilled therapeutic intervention in order to improve the following deficits and impairments:  Abnormal gait, Decreased activity tolerance, Decreased balance, Decreased coordination, Decreased endurance, Decreased mobility, Decreased strength, Postural dysfunction, Pain, Other (comment)(Orthotic dependency)  Visit Diagnosis: Hemiplegia and hemiparesis following cerebral infarction affecting left non-dominant side ([facility])  Muscle weakness (generalized)  Unsteadiness on feet  Paralytic gait  Chronic left-sided low back pain without sciatica     Problem List Patient Active Problem List   Diagnosis Date Noted  . Asthma 06/12/2017  . Hyperlipidemia 06/12/2017  . Seizures ([place] 06/12/2017  . Seizure ([place] 06/12/2017  . GERD (gastroesophageal reflux disease) 10/31/2016  . Chronic rhinitis 10/31/2016  . Chronic cholecystitis 04/12/2015  . Preoperative clearance 03/17/2015  . Spastic hemiplegia affecting nondominant side ([place] 10/27/2013  . Adhesive capsulitis of right shoulder 10/27/2013  . Chronic diastolic heart failure ([place] 08/26/2013  . Dyslipidemia 08/26/2013  . Borderline diabetes 08/26/2013  . Cerebral infarction ([place] 08/26/2013  . Acute CVA (cerebrovascular accident) ([place] 08/23/2013  . Pre-syncope 08/17/2012  . Hypokalemia 08/17/2012  . ARTHRITIS 03/28/2010  . DE QUERVAIN'S TENOSYNOVITIS 03/28/2010  . Normocytic anemia 07/26/2009  . Moderate persistent asthma 03/09/2009  . DENTAL CARIES 03/09/2009  . CANDIDIASIS OF VULVA AND VAGINA 06/04/2008  . SKIN TAG 06/04/2008  . ARTHRITIS, KNEES, BILATERAL 05/22/2008   . HYPERTENSION, BENIGN ESSENTIAL 05/04/2008  . KNEE PAIN, BILATERAL 05/04/2008  . Cough, persistent 05/04/2008  . MENISCUS TEAR, RIGHT 12/07/2005    WJamey ReasPT, DPT 07/16/2017, 12:46 PM  [facility] [address]Phone: [phone]  Fax:  [phone]  Name: [name] MRN: [id number] Date of Birth: 11/02/1950

## 2017-07-23 ENCOUNTER — Ambulatory Visit: Payer: Medicare HMO | Admitting: Occupational Therapy

## 2017-07-23 ENCOUNTER — Encounter: Payer: Self-pay | Admitting: Physical Therapy

## 2017-07-23 ENCOUNTER — Encounter: Payer: Self-pay | Admitting: Occupational Therapy

## 2017-07-23 ENCOUNTER — Ambulatory Visit: Payer: Medicare HMO | Admitting: Physical Therapy

## 2017-07-23 DIAGNOSIS — M25512 Pain in left shoulder: Secondary | ICD-10-CM

## 2017-07-23 DIAGNOSIS — M6281 Muscle weakness (generalized): Secondary | ICD-10-CM

## 2017-07-23 DIAGNOSIS — M25612 Stiffness of left shoulder, not elsewhere classified: Secondary | ICD-10-CM | POA: Diagnosis not present

## 2017-07-23 DIAGNOSIS — R261 Paralytic gait: Secondary | ICD-10-CM | POA: Diagnosis not present

## 2017-07-23 DIAGNOSIS — M545 Low back pain, unspecified: Secondary | ICD-10-CM

## 2017-07-23 DIAGNOSIS — G8929 Other chronic pain: Secondary | ICD-10-CM

## 2017-07-23 DIAGNOSIS — R2681 Unsteadiness on feet: Secondary | ICD-10-CM | POA: Diagnosis not present

## 2017-07-23 DIAGNOSIS — I69354 Hemiplegia and hemiparesis following cerebral infarction affecting left non-dominant side: Secondary | ICD-10-CM

## 2017-07-23 NOTE — Therapy (Signed)
[facility] [address] [place], [place], [address] Phone: [phone]   Fax:  [phone]  Occupational Therapy Treatment  Patient Details  Name: [name] MRN: [id number] Date of Birth: Dec 12, 1950 Referring Provider: [name], MD   Encounter Date: 07/23/2017  OT End of Session - 07/23/17 1123    Visit Number  2    Date for OT Re-Evaluation  08/13/17    Authorization Type  medicare    OT Start Time  0941    OT Stop Time  1020    OT Time Calculation (min)  39 min    Activity Tolerance  Patient tolerated treatment well       Past Medical History:  Diagnosis Date  . Arthritis   . Asthma   . Difficulty sleeping   . Gallstones   . Hyperlipidemia   . Hypertension   . Preoperative clearance   . Spasmodic cough    "IT COMES IN DIFFERENT SEASONS"  . Stroke ([place]) 2015   WEAKNESS L SIDE OF BODY     Past Surgical History:  Procedure Laterality Date  . CHOLECYSTECTOMY N/A 04/12/2015   Procedure: LAPAROSCOPIC CHOLECYSTECTOMY WITH INTRAOPERATIVE CHOLANGIOGRAM;  Surgeon: Greer Pickerel, MD;  Location: [facility];  Service: General;  Laterality: N/A;  . KNEE ARTHROSCOPY  2010    There were no vitals filed for this visit.  Subjective Assessment - 07/23/17 0947    Subjective   these shoes are small to wear with the brace but I have another pair at home that I usually wear that are larger.    Pertinent History  Pt with history of CVA in 2015 with residual L hemiparesis;  See prior notes.  Pt has seizure 02/13/2017 then had one in March and went to doctor (not hospital).      Patient Stated Goals  My therapist in the hospital told me I needed it .     Currently in Pain?  No/denies                   OT Treatments/Exercises (OP) - 07/23/17 0001      ADLs   ADL Comments  Pt arrived today with AFO.  Pt has supination stap on AFO that she is not able to manage on her own.  Had pt walk barefoot in clinic and pt  without pronounced supination at this time. Fitted AFO into current shoe and assisted pt in donning - then adjusted cane to lower height to facilitate more normal postural alignment with functional ambulation.  Pt with sigificant shoulder hiking on LEFT side (non hemiparetic side) with functional ambulation however this did improve with AFO and lower cane.  Pt to bring in larger shoes next session and will practice pt donning shoe and AFO by herself.       Neurological Re-education Exercises   Other Weight-Bearing Exercises 1  Neuro re ed to address development of HEP for UE.  See UE for details. Pt able to return demonstrate after instruction and practice.               OT Education - 07/23/17 1118    Education Details  HEP for UE    Person(s) Educated  Patient    Methods  Explanation;Demonstration;Handout    Comprehension  Verbalized understanding;Returned demonstration          OT Long Term Goals - 07/23/17 1118      OT LONG TERM GOAL #1   Title  Pt will be mod I with upgraded HEP - 08/13/2017    Status  On-going      OT LONG TERM GOAL #2   Title  Pt will demonstrate ability for self ROM to 90* of flexion with no more than 3/10 pain with LUE    Status  On-going      OT LONG TERM GOAL #3   Title  Pt will don AFO with mod I.             Plan - 07/23/17 1119    Clinical Impression Statement  Pt progressing toward for goals.  Pt very willing to consider suggestions.     Occupational Profile and client history currently impacting functional performance  PMH:  history of B CVA's, HTN, HLD, GERn, chronic subdural hygroma.    Occupational performance deficits (Please refer to evaluation for details):  ADL's;IADL's    Rehab Potential  Fair    Current Impairments/barriers affecting progress:  length of time since initial onset    OT Frequency  2x / week    OT Duration  4 weeks    OT Treatment/Interventions  Self-care/ADL training;Neuromuscular education;Therapeutic  exercise;Manual Therapy;Passive range of motion;Therapeutic activities;Patient/family education    Plan  manual therapy for LUE for shoulder flexion, check HEP and add prn, address donning AFO    Consulted and Agree with Plan of Care  Patient       Patient will benefit from skilled therapeutic intervention in order to improve the following deficits and impairments:  Decreased range of motion, Decreased strength, Increased edema, Impaired tone, Pain  Visit Diagnosis: Hemiplegia and hemiparesis following cerebral infarction affecting left non-dominant side (HCC)  Unsteadiness on feet  Muscle weakness (generalized)  Chronic left shoulder pain    Problem List Patient Active Problem List   Diagnosis Date Noted  . Asthma 06/12/2017  . Hyperlipidemia 06/12/2017  . Seizures ([place]) 06/12/2017  . Seizure ([place]) 06/12/2017  . GERD (gastroesophageal reflux disease) 10/31/2016  . Chronic rhinitis 10/31/2016  . Chronic cholecystitis 04/12/2015  . Preoperative clearance 03/17/2015  . Spastic hemiplegia affecting nondominant side ([place]) 10/27/2013  . Adhesive capsulitis of right shoulder 10/27/2013  . Chronic diastolic heart failure ([place]) 08/26/2013  . Dyslipidemia 08/26/2013  . Borderline diabetes 08/26/2013  . Cerebral infarction ([place]) 08/26/2013  . Acute CVA (cerebrovascular accident) ([place]) 08/23/2013  . Pre-syncope 08/17/2012  . Hypokalemia 08/17/2012  . ARTHRITIS 03/28/2010  . DE QUERVAIN'S TENOSYNOVITIS 03/28/2010  . Normocytic anemia 07/26/2009  . Moderate persistent asthma 03/09/2009  . DENTAL CARIES 03/09/2009  . CANDIDIASIS OF VULVA AND VAGINA 06/04/2008  . SKIN TAG 06/04/2008  . ARTHRITIS, KNEES, BILATERAL 05/22/2008  . HYPERTENSION, BENIGN ESSENTIAL 05/04/2008  . KNEE PAIN, BILATERAL 05/04/2008  . Cough, persistent 05/04/2008  . MENISCUS TEAR, RIGHT 12/07/2005    Quay Burow , OTR/L 07/23/2017, 11:30 AM  [facility] [address] [place], [place], [address] Phone: [phone]   Fax:  [phone]  Name: GWENDOLIN BRIEL MRN: [id number] Date of Birth: Mar 02, 1950

## 2017-07-23 NOTE — Therapy (Signed)
[facility] [address] [place], [place], [address] Phone: [phone]   Fax:  [phone]  Physical Therapy Treatment  Patient Details  Name: [name] MRN: [id number] Date of Birth: 1950/09/06 Referring Provider: [name], MD   Encounter Date: 07/23/2017  PT End of Session - 07/23/17 1108    Visit Number  2    Number of Visits  12    Authorization Type  [employer] Medicare    Authorization Time Period  $40 co-pay, (1 co-pay if OT & PT same day), oop $3400, met $120,  Visit limit:  MN    PT Start Time  1020    PT Stop Time  1108    PT Time Calculation (min)  48 min    Equipment Utilized During Treatment  Gait belt    Activity Tolerance  Patient tolerated treatment well;Patient limited by pain    Behavior During Therapy  WFL for tasks assessed/performed       Past Medical History:  Diagnosis Date  . Arthritis   . Asthma   . Difficulty sleeping   . Gallstones   . Hyperlipidemia   . Hypertension   . Preoperative clearance   . Spasmodic cough    "IT COMES IN DIFFERENT SEASONS"  . Stroke ([place]) 2015   WEAKNESS L SIDE OF BODY     Past Surgical History:  Procedure Laterality Date  . CHOLECYSTECTOMY N/A 04/12/2015   Procedure: LAPAROSCOPIC CHOLECYSTECTOMY WITH INTRAOPERATIVE CHOLANGIOGRAM;  Surgeon: Greer Pickerel, MD;  Location[facility];  Service: General;  Laterality: N/A;  . KNEE ARTHROSCOPY  2010    There were no vitals filed for this visit.  Subjective Assessment - 07/23/17 1021    Subjective  No falls.  Pt does pedal bike and walking most days for exercise and standing left calf stretch.  Pt has larger shoe that she thinks she will be able to get her brace in herself.    Currently in Pain?  No/denies                       [facility] Adult PT Treatment/Exercise - 07/23/17 0001      Ambulation/Gait   Ambulation/Gait  Yes    Ambulation/Gait Assistance  5: Supervision    Ambulation/Gait  Assistance Details  Assessing different cane heights (cane was lowered one notch) and L AFO donned, walked without supination strap without significant supination; cues for L weight shift during stance phase.                                      Ambulation Distance (Feet)  115 Feet + 50 + 100 out doors    Assistive device  Straight cane    Gait Pattern  Step-to pattern;Decreased arm swing - left;Decreased step length - right;Decreased stance time - left;Decreased stride length;Decreased dorsiflexion - left;Decreased weight shift to left;Left circumduction;Left hip hike;Left steppage;Left foot flat;Antalgic;Lateral hip instability;Trunk flexed;Abducted - left;Poor foot clearance - left      Exercises   Exercises  Knee/Hip      Knee/Hip Exercises: Stretches   Active Hamstring Stretch  Left;2 reps;30 seconds seated; with L AFO donned          Balance Exercises - 07/23/17 1135      Balance Exercises: Standing   SLS  Eyes open;Upper extremity support 2;3 reps;15 secs cues for posture; standing on LE  with AFO donned    Sit to Stand Time  x10 cues for mechanics without UE support and for putting greater weight through L LE.        PT Education - 07/23/17 1125    Education Details  HEP for LE stretching, strengthening, and balance.  Practiced technique to strap and unstrap AFO.    Person(s) Educated  Patient    Methods  Explanation;Demonstration;Verbal cues;Handout    Comprehension  Verbalized understanding;Returned demonstration;Verbal cues required;Need further instruction       PT Short Term Goals - 07/16/17 1244      PT SHORT TERM GOAL #1   Title  Patient demonstrates & verbalizes understanding of initial HEP.     Time  3    Period  Weeks    Status  New    Target Date  08/03/17      PT SHORT TERM GOAL #2   Title  Patient demonstrates donning AFO with verbal cues only.     Time  3    Period  Weeks    Status  New    Target Date  08/03/17      PT SHORT TERM GOAL #3   Title   Patient ambulates with AFO & cane with head turns to scan environment safely.     Time  3    Period  Weeks    Status  New    Target Date  08/03/17        PT Long Term Goals - 07/16/17 1239      PT LONG TERM GOAL #1   Title  Patient demonstrates & verbalizes ongoing HEP to address LBP, strength, balance & flexibilty.     Time  6    Period  Weeks    Status  New    Target Date  08/24/17      PT LONG TERM GOAL #2   Title  Patient verbalizes understanding of community fitness using Silver Sneakers.     Time  6    Period  Weeks    Status  New    Target Date  08/24/17      PT LONG TERM GOAL #3   Title  Timed Up & Go with AFO with cane <15sec.     Time  6    Period  Weeks    Status  New    Target Date  08/24/17      PT LONG TERM GOAL #4   Title  Patient verbalizes fall prevention strategies including wear of AFO most of awake hours.     Time  6    Period  Weeks    Status  New    Target Date  08/24/17      PT LONG TERM GOAL #5   Title  Dynamic Gait Index with AFO & cane >/= 12/24    Time  6    Period  Weeks    Status  New    Target Date  08/24/17            Plan - 07/23/17 1126    Clinical Impression Statement  Initiated HEP for LE stretching, strengthening, and balance; pt requires cues for technique and UE support for standing balance.  Pt brought her L AFO with supination strap.  Pt ambulated with L AFO and SPC with less left foot drag vs. no AFO and no significant L ankle supination without supination strap  at supervision level on outdoor uneven surfaces.  Rehab Potential  Good    Clinical Impairments Affecting Rehab Potential  left hemiplegia with limited use of LUE, lives alone normally (currently helping out church member who is homeless but is temporary & she is unable to provide assistance)    PT Frequency  2x / week    PT Duration  6 weeks    PT Treatment/Interventions  ADLs/Self Care  Home Management;DME Instruction;Gait training;Stair training;Functional mobility training;Therapeutic activities;Therapeutic exercise;Balance training;Neuromuscular re-education;Patient/family education;Orthotic Fit/Training;Moist Heat;Cryotherapy    PT Next Visit Plan  work on donning AFO & gait with AFO /cane,  work on setting up [employer] at [employer], educate on [employer] use, HEP for low back pain    Consulted and Agree with Plan of Care  Patient       Patient will benefit from skilled therapeutic intervention in order to improve the following deficits and impairments:  Abnormal gait, Decreased activity tolerance, Decreased balance, Decreased coordination, Decreased endurance, Decreased mobility, Decreased strength, Postural dysfunction, Pain, Other (comment)(Orthotic dependency)  Visit Diagnosis: Hemiplegia and hemiparesis following cerebral infarction affecting left non-dominant side (HCC)  Unsteadiness on feet  Muscle weakness (generalized)  Paralytic gait  Chronic left-sided low back pain without sciatica     Problem List Patient Active Problem List   Diagnosis Date Noted  . Asthma 06/12/2017  . Hyperlipidemia 06/12/2017  . Seizures ([place]) 06/12/2017  . Seizure ([place]) 06/12/2017  . GERD (gastroesophageal reflux disease) 10/31/2016  . Chronic rhinitis 10/31/2016  . Chronic cholecystitis 04/12/2015  . Preoperative clearance 03/17/2015  . Spastic hemiplegia affecting nondominant side ([place]) 10/27/2013  . Adhesive capsulitis of right shoulder 10/27/2013  . Chronic diastolic heart failure ([place]) 08/26/2013  . Dyslipidemia 08/26/2013  . Borderline diabetes 08/26/2013  . Cerebral infarction ([place]) 08/26/2013  . Acute CVA (cerebrovascular accident) ([place]) 08/23/2013  . Pre-syncope 08/17/2012  . Hypokalemia 08/17/2012  . ARTHRITIS 03/28/2010  . DE QUERVAIN'S TENOSYNOVITIS 03/28/2010  . Normocytic anemia 07/26/2009  . Moderate persistent asthma 03/09/2009  . DENTAL CARIES  03/09/2009  . CANDIDIASIS OF VULVA AND VAGINA 06/04/2008  . SKIN TAG 06/04/2008  . ARTHRITIS, KNEES, BILATERAL 05/22/2008  . HYPERTENSION, BENIGN ESSENTIAL 05/04/2008  . KNEE PAIN, BILATERAL 05/04/2008  . Cough, persistent 05/04/2008  . MENISCUS TEAR, RIGHT 12/07/2005    Bjorn Loser, PTA  07/23/17, 12:11 PM [facility] [address] [place], [place], [address] Phone: [phone]   Fax:  [phone]  Name: [name] MRN: [id number] Date of Birth: [birth year]

## 2017-07-23 NOTE — Patient Instructions (Addendum)
Access Code: [email or web address]  URL: [email or web address]  Date: 07/23/2017  Prepared by: Bjorn Loser   Exercises Sit to Stand without Arm Support - 10 reps - 1-2 sets - 1x daily - 5x weekly Standing Single Leg Stance with Unilateral Counter Support - 3 reps - 1-2 sets - 15 hold - 1x daily - 5x weekly Seated Hamstring Stretch - 3 reps - 1-2 sets - 1x daily - 5x weekly

## 2017-07-23 NOTE — Patient Instructions (Signed)
Home exercises for your arm:   You should never have pain. IF you do , stop and let your therapist know.  Make sure you are laying down for #2 and #3.    1. Sit and clasp hands. Reach for the floor toward your left foot then sit back up.  Do 10 in the morning and 10 at night 2. Lay on you back. Put a pillow under your left arm (shoulder and elbow). Slowly bend your elbow then straighten it.  Try not to let the elbow move away from your body. Do 10 in the morning and 10 at night 3.  Lay on your back. Clasp hands on your chest. Reach up toward the ceiling and then over your head, then back to our chest. You can go as far as you can without PAIN. Do 10 in the morning and 10 at night.

## 2017-07-25 ENCOUNTER — Ambulatory Visit: Payer: Medicare HMO | Admitting: Physical Therapy

## 2017-07-26 ENCOUNTER — Ambulatory Visit: Payer: Medicare HMO | Admitting: Occupational Therapy

## 2017-08-01 ENCOUNTER — Encounter: Payer: Self-pay | Admitting: Physical Therapy

## 2017-08-01 ENCOUNTER — Ambulatory Visit: Payer: Medicare HMO | Admitting: Physical Therapy

## 2017-08-01 DIAGNOSIS — R261 Paralytic gait: Secondary | ICD-10-CM | POA: Diagnosis not present

## 2017-08-01 DIAGNOSIS — R2681 Unsteadiness on feet: Secondary | ICD-10-CM | POA: Diagnosis not present

## 2017-08-01 DIAGNOSIS — G8929 Other chronic pain: Secondary | ICD-10-CM | POA: Diagnosis not present

## 2017-08-01 DIAGNOSIS — M6281 Muscle weakness (generalized): Secondary | ICD-10-CM

## 2017-08-01 DIAGNOSIS — M25512 Pain in left shoulder: Secondary | ICD-10-CM | POA: Diagnosis not present

## 2017-08-01 DIAGNOSIS — M545 Low back pain: Secondary | ICD-10-CM | POA: Diagnosis not present

## 2017-08-01 DIAGNOSIS — I69354 Hemiplegia and hemiparesis following cerebral infarction affecting left non-dominant side: Secondary | ICD-10-CM | POA: Diagnosis not present

## 2017-08-01 DIAGNOSIS — M25612 Stiffness of left shoulder, not elsewhere classified: Secondary | ICD-10-CM | POA: Diagnosis not present

## 2017-08-01 NOTE — Patient Instructions (Signed)
Cardio machine that seated with back support (recumbent) that use both arms & legs at same time to operate.  An example may be a SciFit stepper or NuStep machine.  Places that I know that have this style are [employer], some [employer].

## 2017-08-02 NOTE — Therapy (Signed)
[facility] [address] [place], [place], [address] Phone: [phone]   Fax:  [phone]  Physical Therapy Treatment  Patient Details  Name: [name] MRN: [id number] Date of Birth: 25-Jun-1950 Referring Provider: [name], MD   Encounter Date: 08/01/2017  PT End of Session - 08/01/17 1540    Visit Number  3    Number of Visits  12    Authorization Type  [employer] Medicare    Authorization Time Period  $40 co-pay, (1 co-pay if OT & PT same day), oop $3400, met $120,  Visit limit:  MN    PT Start Time  1400    PT Stop Time  1445    PT Time Calculation (min)  45 min    Equipment Utilized During Treatment  Gait belt    Activity Tolerance  Patient tolerated treatment well    Behavior During Therapy  [facility] for tasks assessed/performed       Past Medical History:  Diagnosis Date  . Arthritis   . Asthma   . Difficulty sleeping   . Gallstones   . Hyperlipidemia   . Hypertension   . Preoperative clearance   . Spasmodic cough    "IT COMES IN DIFFERENT SEASONS"  . Stroke ([place]) 2015   WEAKNESS L SIDE OF BODY     Past Surgical History:  Procedure Laterality Date  . CHOLECYSTECTOMY N/A 04/12/2015   Procedure: LAPAROSCOPIC CHOLECYSTECTOMY WITH INTRAOPERATIVE CHOLANGIOGRAM;  Surgeon: Greer Pickerel, MD;  Location: [facility];  Service: General;  Laterality: N/A;  . KNEE ARTHROSCOPY  2010    There were no vitals filed for this visit.  Subjective Assessment - 08/01/17 1400    Subjective  She has been wearing AFO since last week. She is able to put it on but it takes time.     Pertinent History  R CVA, chronic right frontotemperal subdural hygroma & right middle cranial fossa arachnoid cyst,, HTN, GERD,     Limitations  Standing;Walking;House hold activities    Patient Stated Goals  to walk better, help back pain so can move more    Currently in Pain?  No/denies       Orthotic Training with AFO Pt doffes AFO / shoe  without issues. PT instructed in donning AFO: proper application of pad /cover on frame, placement of velcro straps while getting foot into shoe, how to apply & tighten straps. Pt verbalized better understanding.  Pt ambulated 100' & 130' with cane & AFO with verbal cues on posture, LLE step length, step width & wt shift over LLE in stance.  PT demo, instructed in negotiating ramp with external rotation of LLE to decrease toe lever and improve stability in stance with weight over LLE. Pt negotiated ramp with AFO & cane with min guard.   Therapeutic Exercise: PT and pt discussed her Silver Sneakers. She reports that she has card & list of facilities in area. She is working on [employer] completed by physician.  PT instructed in set-up & use of SciFit recumbent stepper along with PT recommendation for cardio equipment to be seated with back support (recumbent) that use both UEs & LEs to operate. Pt performed SciFit Level 1 with BUEs & BLEs for 10 min while PT educated patient on [employer]akers & type of facility/ equipment/classes that she may be able to utilize.  PT Short Term Goals - 07/16/17 1244      PT SHORT TERM GOAL #1   Title  Patient demonstrates & verbalizes understanding of initial HEP.     Time  3    Period  Weeks    Status  New    Target Date  08/03/17      PT SHORT TERM GOAL #2   Title  Patient demonstrates donning AFO with verbal cues only.     Time  3    Period  Weeks    Status  New    Target Date  08/03/17      PT SHORT TERM GOAL #3   Title  Patient ambulates with AFO & cane with head turns to scan environment safely.     Time  3    Period  Weeks    Status  New    Target Date  08/03/17        PT Long Term Goals - 07/16/17 1239      PT LONG TERM GOAL #1   Title  Patient demonstrates & verbalizes ongoing HEP to address LBP, strength, balance & flexibilty.     Time  6    Period  Weeks     Status  New    Target Date  08/24/17      PT LONG TERM GOAL #2   Title  Patient verbalizes understanding of community fitness using Silver Sneakers.     Time  6    Period  Weeks    Status  New    Target Date  08/24/17      PT LONG TERM GOAL #3   Title  Timed Up & Go with AFO with cane <15sec.     Time  6    Period  Weeks    Status  New    Target Date  08/24/17      PT LONG TERM GOAL #4   Title  Patient verbalizes fall prevention strategies including wear of AFO most of awake hours.     Time  6    Period  Weeks    Status  New    Target Date  08/24/17      PT LONG TERM GOAL #5   Title  Dynamic Gait Index with AFO & cane >/= 12/24    Time  6    Period  Weeks    Status  New    Target Date  08/24/17            Plan - 08/01/17 1541    Clinical Impression Statement  Patient improved ability to donne AFO with PT instruction. She has basic understanding of Set-up & use of SciFit stepper. She seems to understand fitness center where she will use Silver Sneakers needs to have appropriate equipment for her fitness level.     Rehab Potential  Good    Clinical Impairments Affecting Rehab Potential  left hemiplegia with limited use of LUE, lives alone normally (currently helping out church member who is homeless but is temporary & she is unable to provide assistance)    PT Frequency  2x / week    PT Duration  6 weeks    PT Treatment/Interventions  ADLs/Self Care Home Management;DME Instruction;Gait training;Stair training;Functional mobility training;Therapeutic activities;Therapeutic exercise;Balance training;Neuromuscular re-education;Patient/family education;Orthotic Fit/Training;Moist Heat;Cryotherapy    PT Next Visit Plan  gait with AFO /cane,  work on setting up /use of equipment for [employer] at [employer], [employer] use, HEP for  low back pain    Consulted and Agree with Plan of Care  Patient       Patient will benefit from skilled therapeutic intervention in order to  improve the following deficits and impairments:  Abnormal gait, Decreased activity tolerance, Decreased balance, Decreased coordination, Decreased endurance, Decreased mobility, Decreased strength, Postural dysfunction, Pain, Other (comment)(Orthotic dependency)  Visit Diagnosis: Unsteadiness on feet  Hemiplegia and hemiparesis following cerebral infarction affecting left non-dominant side (HCC)  Muscle weakness (generalized)  Paralytic gait     Problem List Patient Active Problem List   Diagnosis Date Noted  . Asthma 06/12/2017  . Hyperlipidemia 06/12/2017  . Seizures ([place]) 06/12/2017  . Seizure ([place]) 06/12/2017  . GERD (gastroesophageal reflux disease) 10/31/2016  . Chronic rhinitis 10/31/2016  . Chronic cholecystitis 04/12/2015  . Preoperative clearance 03/17/2015  . Spastic hemiplegia affecting nondominant side ([place]) 10/27/2013  . Adhesive capsulitis of right shoulder 10/27/2013  . Chronic diastolic heart failure ([place]) 08/26/2013  . Dyslipidemia 08/26/2013  . Borderline diabetes 08/26/2013  . Cerebral infarction ([place]) 08/26/2013  . Acute CVA (cerebrovascular accident) ([place]) 08/23/2013  . Pre-syncope 08/17/2012  . Hypokalemia 08/17/2012  . ARTHRITIS 03/28/2010  . DE QUERVAIN'S TENOSYNOVITIS 03/28/2010  . Normocytic anemia 07/26/2009  . Moderate persistent asthma 03/09/2009  . DENTAL CARIES 03/09/2009  . CANDIDIASIS OF VULVA AND VAGINA 06/04/2008  . SKIN TAG 06/04/2008  . ARTHRITIS, KNEES, BILATERAL 05/22/2008  . HYPERTENSION, BENIGN ESSENTIAL 05/04/2008  . KNEE PAIN, BILATERAL 05/04/2008  . Cough, persistent 05/04/2008  . MENISCUS TEAR, RIGHT 12/07/2005    Jamey Reas PT, DPT 08/02/2017, 3:45 PM  [facility] [address] [place], [place], [address] Phone: [phone]   Fax:  [phone]  Name: MONSERATH NEFF MRN: [id number] Date of Birth: May 18, 1950

## 2017-08-03 ENCOUNTER — Ambulatory Visit: Payer: Medicare HMO | Admitting: Physical Therapy

## 2017-08-03 ENCOUNTER — Encounter: Payer: Self-pay | Admitting: Physical Therapy

## 2017-08-03 DIAGNOSIS — I69354 Hemiplegia and hemiparesis following cerebral infarction affecting left non-dominant side: Secondary | ICD-10-CM

## 2017-08-03 DIAGNOSIS — R2681 Unsteadiness on feet: Secondary | ICD-10-CM

## 2017-08-03 DIAGNOSIS — M6281 Muscle weakness (generalized): Secondary | ICD-10-CM | POA: Diagnosis not present

## 2017-08-03 DIAGNOSIS — R261 Paralytic gait: Secondary | ICD-10-CM | POA: Diagnosis not present

## 2017-08-03 DIAGNOSIS — M545 Low back pain: Secondary | ICD-10-CM | POA: Diagnosis not present

## 2017-08-03 DIAGNOSIS — G8929 Other chronic pain: Secondary | ICD-10-CM | POA: Diagnosis not present

## 2017-08-03 DIAGNOSIS — M25512 Pain in left shoulder: Secondary | ICD-10-CM | POA: Diagnosis not present

## 2017-08-03 DIAGNOSIS — M25612 Stiffness of left shoulder, not elsewhere classified: Secondary | ICD-10-CM | POA: Diagnosis not present

## 2017-08-03 NOTE — Therapy (Signed)
[facility] [address] [place], [place], [address] Phone: [phone]   Fax:  [phone]  Physical Therapy Treatment  Patient Details  Name: [name] MRN: [id number] Date of Birth: April 10, 1950 Referring Provider: [name], MD   Encounter Date: 08/03/2017  PT End of Session - 08/03/17 1216    Visit Number  4    Number of Visits  12    Authorization Type  [employer]    Authorization Time Period  $40 co-pay, (1 co-pay if OT & PT same day), oop $3400, met $120,  Visit limit:  MN    PT Start Time  1148    PT Stop Time  1230    PT Time Calculation (min)  42 min    Equipment Utilized During Treatment  Gait belt    Activity Tolerance  Patient tolerated treatment well    Behavior During Therapy  WFL for tasks assessed/performed       Past Medical History:  Diagnosis Date  . Arthritis   . Asthma   . Difficulty sleeping   . Gallstones   . Hyperlipidemia   . Hypertension   . Preoperative clearance   . Spasmodic cough    "IT COMES IN DIFFERENT SEASONS"  . Stroke ([facility]) 2015   WEAKNESS L SIDE OF BODY     Past Surgical History:  Procedure Laterality Date  . CHOLECYSTECTOMY N/A 04/12/2015   Procedure: LAPAROSCOPIC CHOLECYSTECTOMY WITH INTRAOPERATIVE CHOLANGIOGRAM;  Surgeon: Greer Pickerel, MD;  Location: [facility];  Service: General;  Laterality: N/A;  . KNEE ARTHROSCOPY  2010    There were no vitals filed for this visit.  Subjective Assessment - 08/03/17 1153    Subjective She is able to put AFO on but it takes time. Pt. reports that it is easier to donne AFO using tips given by PT last session. No falls to report.     Pertinent History  R CVA, chronic right frontotemperal subdural hygroma & right middle cranial fossa arachnoid cyst,, HTN, GERD,     Limitations  Standing;Walking;House hold activities    Patient Stated Goals  to walk better, help back pain so can move more    Currently in Pain?  No/denies    Multiple Pain Sites  No       HEP: Reviewed exercises. Pt. still needs VCs to perform SLS at counter, but is independent with the other ex's.  Gait: Pt. ambulated approx. 500 ft. around track in gym with SPC and AFO with the addition of "toe cap" to address dragging during end of swing phase 115 ft. LOB x2 due to catching of L foot. Pt. was able to recover on her own. Head turns side <> side 60 ft., nodding up <> down 60 ft. Decreased step length with fatigue, decreased DF on L so pt. was encouraged to try the temporary "toe cap" at home to see if it helps with ambulation. VCs for posture of upper body to avoid forward trunk lean, throwing balance forward. Min guard for all gait due to pt.'s ability to recover her balance after her L foot caught, but she is still at an increased risk for falls.       PT Education - 08/03/17 1251    Education Details  Review of HEP; introduction of toe cap to prevent catching at end of swing phase of gait    Person(s) Educated  Patient    Methods  Explanation;Demonstration;Tactile cues;Verbal cues    Comprehension  Verbalized understanding;Returned demonstration;Verbal cues required;Tactile cues required       PT Short Term Goals - 08/03/17 1243      PT SHORT TERM GOAL #1   Title  Patient demonstrates & verbalizes understanding of initial HEP. All STG's due by 08/03/2017    Baseline  08/03/2017 Pt. continues to require cues for SLS at counter.    Time  3    Period  Weeks    Status  Partially Met      PT SHORT TERM GOAL #2   Title  Patient demonstrates donning AFO with verbal cues only.     Baseline  08/03/2017 Met today.    Time  3    Period  Weeks    Status  Achieved      PT SHORT TERM GOAL #3   Title  Patient ambulates with AFO & cane with head turns to scan environment safely.     Baseline  08/03/2017 Pt. continues to have toe catching, putting her at increased risk of falling. Improved with addition of temporary toe cap.    Time  3    Period   Weeks    Status  Partially Met        PT Long Term Goals - 07/16/17 1239      PT LONG TERM GOAL #1   Title  Patient demonstrates & verbalizes ongoing HEP to address LBP, strength, balance & flexibilty.     Time  6    Period  Weeks    Status  New    Target Date  08/24/17      PT LONG TERM GOAL #2   Title  Patient verbalizes understanding of community fitness using Silver Sneakers.     Time  6    Period  Weeks    Status  New    Target Date  08/24/17      PT LONG TERM GOAL #3   Title  Timed Up & Go with AFO with cane <15sec.     Time  6    Period  Weeks    Status  New    Target Date  08/24/17      PT LONG TERM GOAL #4   Title  Patient verbalizes fall prevention strategies including wear of AFO most of awake hours.     Time  6    Period  Weeks    Status  New    Target Date  08/24/17      PT LONG TERM GOAL #5   Title  Dynamic Gait Index with AFO & cane >/= 12/24    Time  6    Period  Weeks    Status  New    Target Date  08/24/17            Plan - 08/03/17 1217    Clinical Impression Statement  Today's session focused on review of HEP and gait with AFO and SPC. Pt. continues to require cues with part of HEP. She has achieved or partially achieved all STG's and will benefit from continued PT to reach remaining goals.    Rehab Potential  Good    Clinical Impairments Affecting Rehab Potential  left hemiplegia with limited use of LUE, lives alone normally (currently helping out church member who is homeless but is temporary & she is unable to provide assistance)    PT Frequency  2x / week    PT Duration  6 weeks    PT Treatment/Interventions  ADLs/Self  Care Home Management;DME Instruction;Gait training;Stair training;Functional mobility training;Therapeutic activities;Therapeutic exercise;Balance training;Neuromuscular re-education;Patient/family education;Orthotic Fit/Training;Moist Heat;Cryotherapy    PT Next Visit Plan  Gait with toe cap, AFO, cane while scanning  environment; balance; gait on compliant surfaces    Consulted and Agree with Plan of Care  Patient       Patient will benefit from skilled therapeutic intervention in order to improve the following deficits and impairments:  Abnormal gait, Decreased activity tolerance, Decreased balance, Decreased coordination, Decreased endurance, Decreased mobility, Decreased strength, Postural dysfunction, Pain, Other (comment)(Orthotic dependency)  Visit Diagnosis: Unsteadiness on feet  Hemiplegia and hemiparesis following cerebral infarction affecting left non-dominant side (HCC)  Muscle weakness (generalized)  Paralytic gait     Problem List Patient Active Problem List   Diagnosis Date Noted  . Asthma 06/12/2017  . Hyperlipidemia 06/12/2017  . Seizures ([facility]) 06/12/2017  . Seizure ([facility]) 06/12/2017  . GERD (gastroesophageal reflux disease) 10/31/2016  . Chronic rhinitis 10/31/2016  . Chronic cholecystitis 04/12/2015  . Preoperative clearance 03/17/2015  . Spastic hemiplegia affecting nondominant side ([facility]) 10/27/2013  . Adhesive capsulitis of right shoulder 10/27/2013  . Chronic diastolic heart failure ([facility]) 08/26/2013  . Dyslipidemia 08/26/2013  . Borderline diabetes 08/26/2013  . Cerebral infarction ([facility]) 08/26/2013  . Acute CVA (cerebrovascular accident) ([facility]) 08/23/2013  . Pre-syncope 08/17/2012  . Hypokalemia 08/17/2012  . ARTHRITIS 03/28/2010  . DE QUERVAIN'S TENOSYNOVITIS 03/28/2010  . Normocytic anemia 07/26/2009  . Moderate persistent asthma 03/09/2009  . DENTAL CARIES 03/09/2009  . CANDIDIASIS OF VULVA AND VAGINA 06/04/2008  . SKIN TAG 06/04/2008  . ARTHRITIS, KNEES, BILATERAL 05/22/2008  . HYPERTENSION, BENIGN ESSENTIAL 05/04/2008  . KNEE PAIN, BILATERAL 05/04/2008  . Cough, persistent 05/04/2008  . MENISCUS TEAR, RIGHT 12/07/2005    Arthor Captain, SPTA 08/03/2017, 1:13 PM  [facility] [address] [place], [place], [address] Phone: [phone]   Fax:  [phone]  Name: [name] MRN: [id number] Date of Birth: Jun 26, 1950

## 2017-08-07 ENCOUNTER — Encounter: Payer: Self-pay | Admitting: Physical Therapy

## 2017-08-07 ENCOUNTER — Encounter: Payer: Self-pay | Admitting: Occupational Therapy

## 2017-08-07 ENCOUNTER — Ambulatory Visit: Payer: Medicare HMO | Admitting: Occupational Therapy

## 2017-08-07 ENCOUNTER — Ambulatory Visit: Payer: Medicare HMO | Attending: Internal Medicine | Admitting: Physical Therapy

## 2017-08-07 DIAGNOSIS — M25612 Stiffness of left shoulder, not elsewhere classified: Secondary | ICD-10-CM | POA: Insufficient documentation

## 2017-08-07 DIAGNOSIS — M25512 Pain in left shoulder: Secondary | ICD-10-CM | POA: Insufficient documentation

## 2017-08-07 DIAGNOSIS — I69354 Hemiplegia and hemiparesis following cerebral infarction affecting left non-dominant side: Secondary | ICD-10-CM

## 2017-08-07 DIAGNOSIS — R261 Paralytic gait: Secondary | ICD-10-CM

## 2017-08-07 DIAGNOSIS — R2681 Unsteadiness on feet: Secondary | ICD-10-CM

## 2017-08-07 DIAGNOSIS — G8929 Other chronic pain: Secondary | ICD-10-CM | POA: Insufficient documentation

## 2017-08-07 DIAGNOSIS — M6281 Muscle weakness (generalized): Secondary | ICD-10-CM | POA: Insufficient documentation

## 2017-08-07 DIAGNOSIS — M545 Low back pain: Secondary | ICD-10-CM | POA: Diagnosis not present

## 2017-08-07 NOTE — Therapy (Signed)
[facility] [address] [place], [place], [address] Phone: [phone]   Fax:  [phone]  Occupational Therapy Treatment  Patient Details  Name: [name] MRN: [id number] Date of Birth: 15-Oct-1950 Referring Provider: [name], MD   Encounter Date: 08/07/2017  OT End of Session - 08/07/17 1234    Visit Number  3    Number of Visits  8    Date for OT Re-Evaluation  08/13/17    Authorization Type  medicare    OT Start Time  1100    OT Stop Time  0383    OT Time Calculation (min)  32 min       Past Medical History:  Diagnosis Date  . Arthritis   . Asthma   . Difficulty sleeping   . Gallstones   . Hyperlipidemia   . Hypertension   . Preoperative clearance   . Spasmodic cough    "IT COMES IN DIFFERENT SEASONS"  . Stroke ([facility]) 2015   WEAKNESS L SIDE OF BODY     Past Surgical History:  Procedure Laterality Date  . CHOLECYSTECTOMY N/A 04/12/2015   Procedure: LAPAROSCOPIC CHOLECYSTECTOMY WITH INTRAOPERATIVE CHOLANGIOGRAM;  Surgeon: Greer Pickerel, MD;  Location: [facility];  Service: General;  Laterality: N/A;  . KNEE ARTHROSCOPY  2010    There were no vitals filed for this visit.  Subjective Assessment - 08/07/17 1104    Subjective   I can now get my brace on by myself. I am wearing it home every day    Pertinent History  Pt with history of CVA in 2015 with residual L hemiparesis;  See prior notes.  Pt has seizure 02/13/2017 then had one in March and went to doctor (not hospital).      Patient Stated Goals  My therapist in the hospital told me I needed it .     Currently in Pain?  No/denies                   OT Treatments/Exercises (OP) - 08/07/17 0001      ADLs   ADL Comments  Reviewed with pt donning and doffing AFO - pt is now mod I with this given that supination strap is no longer needed and pt has larger shoes that will accomodate brace.  Pt reports she is wearing it at home all day and "it  makes a big difference."  Pt awaiting toe cap for shoe.        Neurological Re-education Exercises   Other Exercises 1  Upgraded HEP to add table slides and pronation at table. Pt returned demonstrated and reports no pain. Pt can tolerate approximately 120* of flexion in suping and 110* in sitting with table slides with 0/10 pain. See pt instruction section for details on HEP.              OT Education - 08/07/17 1232    Education Details  Upgraded HEP for LUE    Person(s) Educated  Patient    Methods  Explanation;Demonstration;Handout    Comprehension  Verbalized understanding;Returned demonstration          OT Long Term Goals - 08/07/17 1233      OT LONG TERM GOAL #1   Title  Pt will be mod I with upgraded HEP - 08/13/2017    Status  Achieved      OT LONG TERM GOAL #2   Title  Pt will demonstrate ability for self ROM to 90*  of flexion with no more than 3/10 pain with LUE    Status  Achieved      OT LONG TERM GOAL #3   Title  Pt will don AFO with mod I.     Status  Achieved            Plan - 08/07/17 1233    Clinical Impression Statement  Pt has met all OT goals and is ready for d/c from OT. PT in agreement and states she is very pleased with her progress.     Occupational Profile and client history currently impacting functional performance  PMH:  history of B CVA's, HTN, HLD, GERn, chronic subdural hygroma.    Occupational performance deficits (Please refer to evaluation for details):  ADL's;IADL's    Rehab Potential  Fair    Current Impairments/barriers affecting progress:  length of time since initial onset    OT Frequency  2x / week    OT Duration  4 weeks    OT Treatment/Interventions  Self-care/ADL training;Neuromuscular education;Therapeutic exercise;Manual Therapy;Passive range of motion;Therapeutic activities;Patient/family education    Plan  d/c from OT    Consulted and Agree with Plan of Care  Patient       Patient will benefit from skilled  therapeutic intervention in order to improve the following deficits and impairments:  Decreased range of motion, Decreased strength, Increased edema, Impaired tone, Pain  Visit Diagnosis: Unsteadiness on feet  Hemiplegia and hemiparesis following cerebral infarction affecting left non-dominant side (HCC)  Muscle weakness (generalized)  Chronic left shoulder pain  Stiffness of left shoulder, not elsewhere classified    Problem List Patient Active Problem List   Diagnosis Date Noted  . Asthma 06/12/2017  . Hyperlipidemia 06/12/2017  . Seizures ([facility]) 06/12/2017  . Seizure ([facility]) 06/12/2017  . GERD (gastroesophageal reflux disease) 10/31/2016  . Chronic rhinitis 10/31/2016  . Chronic cholecystitis 04/12/2015  . Preoperative clearance 03/17/2015  . Spastic hemiplegia affecting nondominant side ([facility]) 10/27/2013  . Adhesive capsulitis of right shoulder 10/27/2013  . Chronic diastolic heart failure ([facility]) 08/26/2013  . Dyslipidemia 08/26/2013  . Borderline diabetes 08/26/2013  . Cerebral infarction ([facility]) 08/26/2013  . Acute CVA (cerebrovascular accident) ([facility]) 08/23/2013  . Pre-syncope 08/17/2012  . Hypokalemia 08/17/2012  . ARTHRITIS 03/28/2010  . DE QUERVAIN'S TENOSYNOVITIS 03/28/2010  . Normocytic anemia 07/26/2009  . Moderate persistent asthma 03/09/2009  . DENTAL CARIES 03/09/2009  . CANDIDIASIS OF VULVA AND VAGINA 06/04/2008  . SKIN TAG 06/04/2008  . ARTHRITIS, KNEES, BILATERAL 05/22/2008  . HYPERTENSION, BENIGN ESSENTIAL 05/04/2008  . KNEE PAIN, BILATERAL 05/04/2008  . Cough, persistent 05/04/2008  . MENISCUS TEAR, RIGHT 12/07/2005    Quay Burow, OTR/L 08/07/2017, 12:35 PM  [facility] [address] [place], [place], [address] Phone: [phone]   Fax:  [phone]  Name: SHADOE CRYAN MRN: [id number] Date of Birth: [birth year]

## 2017-08-07 NOTE — Therapy (Signed)
[facility] [address] [place], [place], [address] Phone: [phone]   Fax:  [phone]  Physical Therapy Treatment  Patient Details  Name: [name] MRN: [id number] Date of Birth: Feb 22, 1950 Referring Provider: [name], MD   Encounter Date: 08/07/2017  PT End of Session - 08/07/17 1109    Visit Number  5    Number of Visits  12    Authorization Type  [employer]    Authorization Time Period  $40 co-pay, (1 co-pay if OT & PT same day), oop $3400, met $120,  Visit limit:  MN    PT Start Time  6073 pt late due to transportation    PT Stop Time  1100    PT Time Calculation (min)  31 min    Equipment Utilized During Treatment  Gait belt    Activity Tolerance  Patient tolerated treatment well    Behavior During Therapy  WFL for tasks assessed/performed       Past Medical History:  Diagnosis Date  . Arthritis   . Asthma   . Difficulty sleeping   . Gallstones   . Hyperlipidemia   . Hypertension   . Preoperative clearance   . Spasmodic cough    "IT COMES IN DIFFERENT SEASONS"  . Stroke ([facility]) 2015   WEAKNESS L SIDE OF BODY     Past Surgical History:  Procedure Laterality Date  . CHOLECYSTECTOMY N/A 04/12/2015   Procedure: LAPAROSCOPIC CHOLECYSTECTOMY WITH INTRAOPERATIVE CHOLANGIOGRAM;  Surgeon: Greer Pickerel, MD;  Location: [facility];  Service: General;  Laterality: N/A;  . KNEE ARTHROSCOPY  2010    There were no vitals filed for this visit.  Subjective Assessment - 08/07/17 1034    Subjective  No falls to report. Pt. reports that she would like to have a toe cap added to her shoe to help with her forward foot movement on her Left side. Pt. was late due to transportation difficulty.    Pertinent History  R CVA, chronic right frontotemperal subdural hygroma & right middle cranial fossa arachnoid cyst,, HTN, GERD,     Limitations  Standing;Walking;House hold activities    Patient Stated Goals  to walk  better, help back pain so can move more    Currently in Pain?  No/denies        Gait: Pt. was able to carry on conversation while ambulating with SPC approx. 450 ft. on level indoor surface but slowed her pace and decreased her step length (min guard due to slight veering of course and decreased speed, step length, and poor foot clearance on the Left). She was able to safely scan (approx. 40 ft. head turns side <> side & up <> down, min guard due to slight veering of course, decreased step length, and poor foot clearance on the Left) her environment while maintaining pace. Temporary toe cap was added to left shoe at the beginning of treatment to address the catching of the Left foot due to the poor clearance during swing phase.  Gait on compliant surface (red mat) 2x down and back; no hands on counter, lifting feet as high as possible forward 4x, 1 hand on counter, backward 4x, 1 hand on counter; side stepping down and back 3x both hands on counter, min assist for all due to slight LOB due to fatigue but adequate recovery with minimal assistance from SPTA.      PT Short Term Goals - 08/03/17 1243      PT  SHORT TERM GOAL #1   Title  Patient demonstrates & verbalizes understanding of initial HEP. All STG's due by 08/03/2017    Baseline  08/03/2017 Pt. continues to require cues for SLS at counter.    Time  3    Period  Weeks    Status  Partially Met      PT SHORT TERM GOAL #2   Title  Patient demonstrates donning AFO with verbal cues only.     Baseline  08/03/2017 Met today.    Time  3    Period  Weeks    Status  Achieved      PT SHORT TERM GOAL #3   Title  Patient ambulates with AFO & cane with head turns to scan environment safely.     Baseline  08/03/2017 Pt. continues to have toe catching, putting her at increased risk of falling. Improved with addition of temporary toe cap.    Time  3    Period  Weeks    Status  Partially Met        PT Long Term Goals - 07/16/17 1239      PT  LONG TERM GOAL #1   Title  Patient demonstrates & verbalizes ongoing HEP to address LBP, strength, balance & flexibilty.     Time  6    Period  Weeks    Status  New    Target Date  08/24/17      PT LONG TERM GOAL #2   Title  Patient verbalizes understanding of community fitness using Silver Sneakers.     Time  6    Period  Weeks    Status  New    Target Date  08/24/17      PT LONG TERM GOAL #3   Title  Timed Up & Go with AFO with cane <15sec.     Time  6    Period  Weeks    Status  New    Target Date  08/24/17      PT LONG TERM GOAL #4   Title  Patient verbalizes fall prevention strategies including wear of AFO most of awake hours.     Time  6    Period  Weeks    Status  New    Target Date  08/24/17      PT LONG TERM GOAL #5   Title  Dynamic Gait Index with AFO & cane >/= 12/24    Time  6    Period  Weeks    Status  New    Target Date  08/24/17       Plan - 08/07/17 1106    Clinical Impression Statement  Today's session focused on gait with AFO and SPC while multi-tasking and over compliant surfaces to improve functional mobility. Pt. would benefit from continued PT to address lingering balance deficits and help improve pt's confidence in her ability to ambulate around her home.    Rehab Potential  Good    Clinical Impairments Affecting Rehab Potential  left hemiplegia with limited use of LUE, lives alone normally (currently helping out church member who is homeless but is temporary & she is unable to provide assistance)    PT Frequency  2x / week    PT Duration  6 weeks    PT Treatment/Interventions  ADLs/Self Care Home Management;DME Instruction;Gait training;Stair training;Functional mobility training;Therapeutic activities;Therapeutic exercise;Balance training;Neuromuscular re-education;Patient/family education;Orthotic Fit/Training;Moist Heat;Cryotherapy    PT Next Visit Plan  Gait with toe cap, AFO,  cane while scanning environment; balance; gait on compliant surfaces     Consulted and Agree with Plan of Care  Patient       Patient will benefit from skilled therapeutic intervention in order to improve the following deficits and impairments:  Abnormal gait, Decreased activity tolerance, Decreased balance, Decreased coordination, Decreased endurance, Decreased mobility, Decreased strength, Postural dysfunction, Pain, Other (comment)(Orthotic dependency)  Visit Diagnosis: Unsteadiness on feet  Hemiplegia and hemiparesis following cerebral infarction affecting left non-dominant side (HCC)  Muscle weakness (generalized)  Paralytic gait     Problem List Patient Active Problem List   Diagnosis Date Noted  . Asthma 06/12/2017  . Hyperlipidemia 06/12/2017  . Seizures ([facility]) 06/12/2017  . Seizure ([facility]) 06/12/2017  . GERD (gastroesophageal reflux disease) 10/31/2016  . Chronic rhinitis 10/31/2016  . Chronic cholecystitis 04/12/2015  . Preoperative clearance 03/17/2015  . Spastic hemiplegia affecting nondominant side ([facility]) 10/27/2013  . Adhesive capsulitis of right shoulder 10/27/2013  . Chronic diastolic heart failure ([facility]) 08/26/2013  . Dyslipidemia 08/26/2013  . Borderline diabetes 08/26/2013  . Cerebral infarction ([facility]) 08/26/2013  . Acute CVA (cerebrovascular accident) ([facility]) 08/23/2013  . Pre-syncope 08/17/2012  . Hypokalemia 08/17/2012  . ARTHRITIS 03/28/2010  . DE QUERVAIN'S TENOSYNOVITIS 03/28/2010  . Normocytic anemia 07/26/2009  . Moderate persistent asthma 03/09/2009  . DENTAL CARIES 03/09/2009  . CANDIDIASIS OF VULVA AND VAGINA 06/04/2008  . SKIN TAG 06/04/2008  . ARTHRITIS, KNEES, BILATERAL 05/22/2008  . HYPERTENSION, BENIGN ESSENTIAL 05/04/2008  . KNEE PAIN, BILATERAL 05/04/2008  . Cough, persistent 05/04/2008  . MENISCUS TEAR, RIGHT 12/07/2005    Arthor Captain, SPTA 08/07/2017, 11:31 AM  [facility] [address] [place], [place], [address] Phone: [phone]    Fax:  [phone]  Name: [name] MRN: [id number] Date of Birth: 25-Dec-1950

## 2017-08-07 NOTE — Patient Instructions (Signed)
Home exercises for your arm:   You should never have pain. IF you do , stop and let your therapist know.  Make sure you are laying down for #2 and #3.  Make sure you sit at table to do #4 and #5.    1. Sit and clasp hands. Reach for the floor toward your left foot then sit back up.  Do 10 in the morning and 10 at night 2. Lay on you back. Put a pillow under your left arm (shoulder and elbow). Slowly bend your elbow then straighten it.  Try not to let the elbow move away from your body. Do 10 in the morning and 10 at night 3.  Lay on your back. Clasp hands on your chest. Reach up toward the ceiling and then over your head, then back to our chest. You can go as far as you can without PAIN. Do 10 in the morning and 10 at night.   Sit at table to do these: 4.  Pull your chair close to your table.  Clasp hands. Slowly slide your arms out in front of you as far as you can as long as you don't have any shoulder pain.  Then sit back up and slide your arms back. Do 10, rest then do 10 more. 5. Same position as #1.  Clasp hands.  Slowly use your right arm to turn your left forearm and hand facing up.  Do this gently so you don't hurt your wrist.  Then turn it back over. Do 10, rest then do 10 more.

## 2017-08-13 ENCOUNTER — Ambulatory Visit: Payer: Medicare HMO | Admitting: Physical Therapy

## 2017-08-13 ENCOUNTER — Encounter: Payer: Medicare HMO | Admitting: Occupational Therapy

## 2017-08-13 DIAGNOSIS — R569 Unspecified convulsions: Secondary | ICD-10-CM | POA: Diagnosis not present

## 2017-08-13 DIAGNOSIS — J4520 Mild intermittent asthma, uncomplicated: Secondary | ICD-10-CM | POA: Diagnosis not present

## 2017-08-13 DIAGNOSIS — I6789 Other cerebrovascular disease: Secondary | ICD-10-CM | POA: Diagnosis not present

## 2017-08-13 DIAGNOSIS — I10 Essential (primary) hypertension: Secondary | ICD-10-CM | POA: Diagnosis not present

## 2017-08-14 ENCOUNTER — Encounter: Payer: Medicare HMO | Admitting: Occupational Therapy

## 2017-08-14 ENCOUNTER — Ambulatory Visit: Payer: Medicare HMO | Admitting: Physical Therapy

## 2017-08-15 ENCOUNTER — Ambulatory Visit: Payer: Medicare HMO | Admitting: Physical Therapy

## 2017-08-15 ENCOUNTER — Encounter: Payer: Self-pay | Admitting: Physical Therapy

## 2017-08-15 DIAGNOSIS — M545 Low back pain: Secondary | ICD-10-CM | POA: Diagnosis not present

## 2017-08-15 DIAGNOSIS — G8929 Other chronic pain: Secondary | ICD-10-CM | POA: Diagnosis not present

## 2017-08-15 DIAGNOSIS — R2681 Unsteadiness on feet: Secondary | ICD-10-CM

## 2017-08-15 DIAGNOSIS — M25612 Stiffness of left shoulder, not elsewhere classified: Secondary | ICD-10-CM | POA: Diagnosis not present

## 2017-08-15 DIAGNOSIS — M6281 Muscle weakness (generalized): Secondary | ICD-10-CM

## 2017-08-15 DIAGNOSIS — R261 Paralytic gait: Secondary | ICD-10-CM | POA: Diagnosis not present

## 2017-08-15 DIAGNOSIS — I69354 Hemiplegia and hemiparesis following cerebral infarction affecting left non-dominant side: Secondary | ICD-10-CM

## 2017-08-15 DIAGNOSIS — M25512 Pain in left shoulder: Secondary | ICD-10-CM | POA: Diagnosis not present

## 2017-08-15 NOTE — Therapy (Signed)
[facility] [address] [place], [place], [address] Phone: [phone]   Fax:  [phone]  Physical Therapy Treatment  Patient Details  Name: [name] MRN: [id number] Date of Birth: March 24, 1950 Referring Provider: [name], MD   Encounter Date: 08/15/2017  PT End of Session - 08/15/17 0938    Visit Number  6   Number of Visits  12    Authorization Type  [employer] Medicare    Authorization Time Period  $40 co-pay, (1 co-pay if OT & PT same day), oop $3400, met $120,  Visit limit:  MN    PT Start Time  0845    PT Stop Time  0930    PT Time Calculation (min)  45 min    Equipment Utilized During Treatment  Gait belt    Activity Tolerance  Patient tolerated treatment well    Behavior During Therapy  [facility] for tasks assessed/performed       Past Medical History:  Diagnosis Date  . Arthritis   . Asthma   . Difficulty sleeping   . Gallstones   . Hyperlipidemia   . Hypertension   . Preoperative clearance   . Spasmodic cough    "IT COMES IN DIFFERENT SEASONS"  . Stroke ([place]) 2015   WEAKNESS L SIDE OF BODY     Past Surgical History:  Procedure Laterality Date  . CHOLECYSTECTOMY N/A 04/12/2015   Procedure: LAPAROSCOPIC CHOLECYSTECTOMY WITH INTRAOPERATIVE CHOLANGIOGRAM;  Surgeon: Greer Pickerel, MD;  Location: [facility];  Service: General;  Laterality: N/A;  . KNEE ARTHROSCOPY  2010    There were no vitals filed for this visit.  Subjective Assessment - 08/15/17 0851    Subjective  No falls to report. Pt. reports that she would like to have a toe cap added to her shoe to help with her forward foot movement on her Left side. Pt. reports that she is having an easier time donning and doffing her brace.    Pertinent History  R CVA, chronic right frontotemperal subdural hygroma & right middle cranial fossa arachnoid cyst,, HTN, GERD,     Limitations  Standing;Walking;House hold activities    Patient Stated Goals  to walk  better, help back pain so can move more    Currently in Pain?  No/denies         Dynamic gait- with SPC, with toe cap 2 laps around track and without toe cap 2 laps around track totaling approx. 475 ft., patient was able to maintain conversation 80% of the time and had 1 significant toe catch during the 2 laps without toe cap; min guard for all gait due to potential for falls and decreased gait speed, decreased step length, decreased foot clearance on Left though it has improved, decreased balance reactions. Patient required rest breaks after each lap without the toe cap due to the fatigue of the LLE muscles and increasingly decreased foot clearance which eventually lead to the toe catching/trip.  Corner Balance- compliant surface (pillows), 30 sec EC x2 wide BOS min guard; 30 sec SLS min guard; wide BOS, EC, head turn side <> side 10x, up <> down 10x, min assist due to increased postural sway    PT Education - 08/15/17 1203    Education Details  Pt. was advised to look into getting a new pair of shoes before trying to get a toe cap because her current shoes are not appropriate and will wear out relatively quickly. She said that she will  talk to her family about the new shoes.        PT Short Term Goals - 08/03/17 1243      PT SHORT TERM GOAL #1   Title  Patient demonstrates & verbalizes understanding of initial HEP. All STG's due by 08/03/2017    Baseline  08/03/2017 Pt. continues to require cues for SLS at counter.    Time  3    Period  Weeks    Status  Partially Met      PT SHORT TERM GOAL #2   Title  Patient demonstrates donning AFO with verbal cues only.     Baseline  08/03/2017 Met today.    Time  3    Period  Weeks    Status  Achieved      PT SHORT TERM GOAL #3   Title  Patient ambulates with AFO & cane with head turns to scan environment safely.     Baseline  08/03/2017 Pt. continues to have toe catching, putting her at increased risk of falling. Improved with addition of  temporary toe cap.    Time  3    Period  Weeks    Status  Partially Met        PT Long Term Goals - 07/16/17 1239      PT LONG TERM GOAL #1   Title  Patient demonstrates & verbalizes ongoing HEP to address LBP, strength, balance & flexibilty.     Time  6    Period  Weeks    Status  New    Target Date  08/24/17      PT LONG TERM GOAL #2   Title  Patient verbalizes understanding of community fitness using Silver Sneakers.     Time  6    Period  Weeks    Status  New    Target Date  08/24/17      PT LONG TERM GOAL #3   Title  Timed Up & Go with AFO with cane <15sec.     Time  6    Period  Weeks    Status  New    Target Date  08/24/17      PT LONG TERM GOAL #4   Title  Patient verbalizes fall prevention strategies including wear of AFO most of awake hours.     Time  6    Period  Weeks    Status  New    Target Date  08/24/17      PT LONG TERM GOAL #5   Title  Dynamic Gait Index with AFO & cane >/= 12/24    Time  6    Period  Weeks    Status  New    Target Date  08/24/17         Plan - 08/15/17 0939    Clinical Impression Statement  Today's session focused on gait with and without a toe cap and balance on compliant surfaces. Pt. would benefit from continued PT to address remaining unmet LTGs.     Rehab Potential  Good    Clinical Impairments Affecting Rehab Potential  left hemiplegia with limited use of LUE, lives alone normally (currently helping out church member who is homeless but is temporary & she is unable to provide assistance)    PT Frequency  2x / week    PT Duration  6 weeks    PT Treatment/Interventions  ADLs/Self Care Home Management;DME Instruction;Gait training;Stair training;Functional mobility training;Therapeutic activities;Therapeutic exercise;Balance training;Neuromuscular re-education;Patient/family education;Orthotic  Fit/Training;Moist Heat;Cryotherapy    PT Next Visit Plan  Begin to check LTGs for discharge versus renewal.    Consulted and  Agree with Plan of Care  Patient       Patient will benefit from skilled therapeutic intervention in order to improve the following deficits and impairments:  Abnormal gait, Decreased activity tolerance, Decreased balance, Decreased coordination, Decreased endurance, Decreased mobility, Decreased strength, Postural dysfunction, Pain, Other (comment)(Orthotic dependency)  Visit Diagnosis: Unsteadiness on feet  Hemiplegia and hemiparesis following cerebral infarction affecting left non-dominant side (HCC)  Muscle weakness (generalized)  Paralytic gait     Problem List Patient Active Problem List   Diagnosis Date Noted  . Asthma 06/12/2017  . Hyperlipidemia 06/12/2017  . Seizures ([place]) 06/12/2017  . Seizure ([place]) 06/12/2017  . GERD (gastroesophageal reflux disease) 10/31/2016  . Chronic rhinitis 10/31/2016  . Chronic cholecystitis 04/12/2015  . Preoperative clearance 03/17/2015  . Spastic hemiplegia affecting nondominant side ([place]) 10/27/2013  . Adhesive capsulitis of right shoulder 10/27/2013  . Chronic diastolic heart failure ([place]) 08/26/2013  . Dyslipidemia 08/26/2013  . Borderline diabetes 08/26/2013  . Cerebral infarction ([place]) 08/26/2013  . Acute CVA (cerebrovascular accident) ([place]) 08/23/2013  . Pre-syncope 08/17/2012  . Hypokalemia 08/17/2012  . ARTHRITIS 03/28/2010  . DE QUERVAIN'S TENOSYNOVITIS 03/28/2010  . Normocytic anemia 07/26/2009  . Moderate persistent asthma 03/09/2009  . DENTAL CARIES 03/09/2009  . CANDIDIASIS OF VULVA AND VAGINA 06/04/2008  . SKIN TAG 06/04/2008  . ARTHRITIS, KNEES, BILATERAL 05/22/2008  . HYPERTENSION, BENIGN ESSENTIAL 05/04/2008  . KNEE PAIN, BILATERAL 05/04/2008  . Cough, persistent 05/04/2008  . MENISCUS TEAR, RIGHT 12/07/2005    Arthor Captain, SPTA 08/15/2017, 12:08 PM  [facility] [address] [place], [place], [address] Phone: [phone]   Fax:   [phone]  Name: [name] MRN: [id number] Date of Birth: Apr 11, 1950

## 2017-08-20 ENCOUNTER — Ambulatory Visit: Payer: Medicare HMO | Admitting: Physical Therapy

## 2017-08-20 ENCOUNTER — Encounter: Payer: Self-pay | Admitting: Physical Therapy

## 2017-08-20 DIAGNOSIS — G8929 Other chronic pain: Secondary | ICD-10-CM

## 2017-08-20 DIAGNOSIS — I69354 Hemiplegia and hemiparesis following cerebral infarction affecting left non-dominant side: Secondary | ICD-10-CM | POA: Diagnosis not present

## 2017-08-20 DIAGNOSIS — M6281 Muscle weakness (generalized): Secondary | ICD-10-CM | POA: Diagnosis not present

## 2017-08-20 DIAGNOSIS — M545 Low back pain: Secondary | ICD-10-CM

## 2017-08-20 DIAGNOSIS — R2681 Unsteadiness on feet: Secondary | ICD-10-CM | POA: Diagnosis not present

## 2017-08-20 DIAGNOSIS — M25612 Stiffness of left shoulder, not elsewhere classified: Secondary | ICD-10-CM | POA: Diagnosis not present

## 2017-08-20 DIAGNOSIS — R261 Paralytic gait: Secondary | ICD-10-CM | POA: Diagnosis not present

## 2017-08-20 DIAGNOSIS — M25512 Pain in left shoulder: Secondary | ICD-10-CM | POA: Diagnosis not present

## 2017-08-20 NOTE — Patient Instructions (Signed)
  Exercises Supine Bridge - 10 reps - 2 sets - 3 hold - 1-2x daily - 7x weekly Sit to Stand without Arm Support - 10 reps - 1-2 sets - 1x daily - 5x weekly Seated Hamstring Stretch - 3 reps - 30 sets - 1x daily - 7x weekly Standing Single Leg Stance with Unilateral Counter Support - 3 reps - 1-2 sets - 15 hold - 1x daily - 5x weekly Standing Tandem Balance with Counter Support - 3 reps - 30 hold - 1x daily - 7x weekly

## 2017-08-20 NOTE — Therapy (Signed)
[facility] [address] [place], [place], [address] Phone: [phone]   Fax:  [phone]  Physical Therapy Treatment  Patient Details  Name: [name] MRN: [id number] Date of Birth: September 21, 1950 Referring Provider: [name], MD   Encounter Date: 08/20/2017  PT End of Session - 08/20/17 0930    Visit Number  6    Number of Visits  12    Authorization Type  [employer] Medicare    Authorization Time Period  $40 co-pay, (1 co-pay if OT & PT same day), oop $3400, met $120,  Visit limit:  MN    PT Start Time  0845    PT Stop Time  0930    PT Time Calculation (min)  45 min    Equipment Utilized During Treatment  Gait belt    Activity Tolerance  Patient tolerated treatment well    Behavior During Therapy  [facility] for tasks assessed/performed       Past Medical History:  Diagnosis Date  . Arthritis   . Asthma   . Difficulty sleeping   . Gallstones   . Hyperlipidemia   . Hypertension   . Preoperative clearance   . Spasmodic cough    "IT COMES IN DIFFERENT SEASONS"  . Stroke ([place]) 2015   WEAKNESS L SIDE OF BODY     Past Surgical History:  Procedure Laterality Date  . CHOLECYSTECTOMY N/A 04/12/2015   Procedure: LAPAROSCOPIC CHOLECYSTECTOMY WITH INTRAOPERATIVE CHOLANGIOGRAM;  Surgeon: Greer Pickerel, MD;  Location: [facility];  Service: General;  Laterality: N/A;  . KNEE ARTHROSCOPY  2010    There were no vitals filed for this visit.  Subjective Assessment - 08/20/17 0849    Subjective  No falls; nothing new to report.  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    Pertinent History  R CVA, chronic right frontotemperal subdural hygroma & right middle cranial fossa arachnoid cyst,, HTN, GERD,     Limitations  Standing;Walking;House hold activities    Patient Stated Goals  to walk better, help back pain so can move more    Currently in Pain?  No/denies                        [facility] Adult PT Treatment/Exercise - 08/20/17 0001      Knee/Hip Exercises: Stretches   Active Hamstring Stretch  Left;2 reps;30 seconds      Knee/Hip Exercises: Aerobic   Stepper  seated stepper, L 2.0, 10 min      Knee/Hip Exercises: Seated   Sit to Sand  1 set;10 reps;without UE support      Knee/Hip Exercises: Supine   Bridges  Strengthening;1 set;10 reps          Balance Exercises - 08/20/17 0919      Balance Exercises: Standing   Tandem Stance  Eyes open;Upper extremity support 2;2 reps;30 secs    SLS  Eyes open;Upper extremity support 2;10 secs;2 reps        PT Education - 08/20/17 0900    Education Details  Discussed POC and discharged for this week.  Discussed/reviewed recommendations for continuing fitness and current HEP.    Person(s) Educated  Patient    Methods  Explanation    Comprehension  Verbalized understanding       PT Short Term Goals - 08/03/17 1243      PT SHORT TERM GOAL #1   Title  Patient demonstrates &  verbalizes understanding of initial HEP. All STG's due by 08/03/2017    Baseline  08/03/2017 Pt. continues to require cues for SLS at counter.    Time  3    Period  Weeks    Status  Partially Met      PT SHORT TERM GOAL #2   Title  Patient demonstrates donning AFO with verbal cues only.     Baseline  08/03/2017 Met today.    Time  3    Period  Weeks    Status  Achieved      PT SHORT TERM GOAL #3   Title  Patient ambulates with AFO & cane with head turns to scan environment safely.     Baseline  08/03/2017 Pt. continues to have toe catching, putting her at increased risk of falling. Improved with addition of temporary toe cap.    Time  3    Period  Weeks    Status  Partially Met        PT Long Term Goals - 07/16/17 1239      PT LONG TERM GOAL #1   Title  Patient demonstrates & verbalizes ongoing HEP to address LBP, strength, balance & flexibilty.     Time  6    Period  Weeks    Status  New     Target Date  08/24/17      PT LONG TERM GOAL #2   Title  Patient verbalizes understanding of community fitness using Silver Sneakers.     Time  6    Period  Weeks    Status  New    Target Date  08/24/17      PT LONG TERM GOAL #3   Title  Timed Up & Go with AFO with cane <15sec.     Time  6    Period  Weeks    Status  New    Target Date  08/24/17      PT LONG TERM GOAL #4   Title  Patient verbalizes fall prevention strategies including wear of AFO most of awake hours.     Time  6    Period  Weeks    Status  New    Target Date  08/24/17      PT LONG TERM GOAL #5   Title  Dynamic Gait Index with AFO & cane >/= 12/24    Time  6    Period  Weeks    Status  New    Target Date  08/24/17            Plan - 08/20/17 1259    Clinical Impression Statement  Prepared Pt for discharge this week.  Updated HEP for LE strength and balance; pt requires UE support for SLS and tandem stance exercises. Pt was able to demonstrate understanding and improvement with current HEP.  Pt tolerated 10 min on seated Sci fit stepper and understands the importance of a continuing fitness plan.                                             Rehab Potential  Good    Clinical Impairments Affecting Rehab Potential  left hemiplegia with limited use of LUE, lives alone normally (currently helping out church member who is homeless but is temporary & she is unable to provide assistance)    PT Frequency  2x / week    PT Duration  6 weeks    PT Treatment/Interventions  ADLs/Self Care Home Management;DME Instruction;Gait training;Stair training;Functional mobility training;Therapeutic activities;Therapeutic exercise;Balance training;Neuromuscular re-education;Patient/family education;Orthotic Fit/Training;Moist Heat;Cryotherapy    PT Next Visit Plan   check LTGs for discharge     Consulted and Agree with Plan of Care  Patient       Patient will benefit from skilled therapeutic intervention in order to improve the  following deficits and impairments:  Abnormal gait, Decreased activity tolerance, Decreased balance, Decreased coordination, Decreased endurance, Decreased mobility, Decreased strength, Postural dysfunction, Pain, Other (comment)(Orthotic dependency)  Visit Diagnosis: Hemiplegia and hemiparesis following cerebral infarction affecting left non-dominant side (HCC)  Muscle weakness (generalized)  Paralytic gait  Chronic left-sided low back pain without sciatica     Problem List Patient Active Problem List   Diagnosis Date Noted  . Asthma 06/12/2017  . Hyperlipidemia 06/12/2017  . Seizures ([place]) 06/12/2017  . Seizure ([place]) 06/12/2017  . GERD (gastroesophageal reflux disease) 10/31/2016  . Chronic rhinitis 10/31/2016  . Chronic cholecystitis 04/12/2015  . Preoperative clearance 03/17/2015  . Spastic hemiplegia affecting nondominant side ([place]) 10/27/2013  . Adhesive capsulitis of right shoulder 10/27/2013  . Chronic diastolic heart failure ([place]) 08/26/2013  . Dyslipidemia 08/26/2013  . Borderline diabetes 08/26/2013  . Cerebral infarction ([place]) 08/26/2013  . Acute CVA (cerebrovascular accident) ([place]) 08/23/2013  . Pre-syncope 08/17/2012  . Hypokalemia 08/17/2012  . ARTHRITIS 03/28/2010  . DE QUERVAIN'S TENOSYNOVITIS 03/28/2010  . Normocytic anemia 07/26/2009  . Moderate persistent asthma 03/09/2009  . DENTAL CARIES 03/09/2009  . CANDIDIASIS OF VULVA AND VAGINA 06/04/2008  . SKIN TAG 06/04/2008  . ARTHRITIS, KNEES, BILATERAL 05/22/2008  . HYPERTENSION, BENIGN ESSENTIAL 05/04/2008  . KNEE PAIN, BILATERAL 05/04/2008  . Cough, persistent 05/04/2008  . MENISCUS TEAR, RIGHT 12/07/2005    Bjorn Loser, PTA  08/20/17, 1:05 PM [facility] [address] [place], [place], [address] Phone: [phone]   Fax:  [phone]  Name: [name] MRN: [id number] Date of Birth: 1950/02/28

## 2017-08-22 ENCOUNTER — Encounter: Payer: Self-pay | Admitting: Physical Therapy

## 2017-08-22 ENCOUNTER — Ambulatory Visit: Payer: Medicare HMO | Admitting: Physical Therapy

## 2017-08-22 DIAGNOSIS — G8929 Other chronic pain: Secondary | ICD-10-CM | POA: Diagnosis not present

## 2017-08-22 DIAGNOSIS — R2681 Unsteadiness on feet: Secondary | ICD-10-CM

## 2017-08-22 DIAGNOSIS — R261 Paralytic gait: Secondary | ICD-10-CM | POA: Diagnosis not present

## 2017-08-22 DIAGNOSIS — M6281 Muscle weakness (generalized): Secondary | ICD-10-CM | POA: Diagnosis not present

## 2017-08-22 DIAGNOSIS — I69354 Hemiplegia and hemiparesis following cerebral infarction affecting left non-dominant side: Secondary | ICD-10-CM

## 2017-08-22 DIAGNOSIS — M25512 Pain in left shoulder: Secondary | ICD-10-CM | POA: Diagnosis not present

## 2017-08-22 DIAGNOSIS — M545 Low back pain: Secondary | ICD-10-CM | POA: Diagnosis not present

## 2017-08-22 DIAGNOSIS — M25612 Stiffness of left shoulder, not elsewhere classified: Secondary | ICD-10-CM | POA: Diagnosis not present

## 2017-08-23 NOTE — Therapy (Signed)
[facility] [address] [place], [place], [address] Phone: [phone]   Fax:  [phone]  Physical Therapy Treatment  Patient Details  Name: [name] MRN: [id number] Date of Birth: 10-16-1950 Referring Provider: [name], MD   Encounter Date: 08/22/2017  PT End of Session - 08/22/17 0930    Visit Number  7    Number of Visits  12    Authorization Type  [employer] Medicare    Authorization Time Period  $40 co-pay, (1 co-pay if OT & PT same day), oop $3400, met $120,  Visit limit:  MN    PT Start Time  0900    PT Stop Time  0930    PT Time Calculation (min)  30 min    Equipment Utilized During Treatment  Gait belt    Activity Tolerance  Patient tolerated treatment well    Behavior During Therapy  [facility] for tasks assessed/performed       Past Medical History:  Diagnosis Date  . Arthritis   . Asthma   . Difficulty sleeping   . Gallstones   . Hyperlipidemia   . Hypertension   . Preoperative clearance   . Spasmodic cough    "IT COMES IN DIFFERENT SEASONS"  . Stroke ([place]) 2015   WEAKNESS L SIDE OF BODY     Past Surgical History:  Procedure Laterality Date  . CHOLECYSTECTOMY N/A 04/12/2015   Procedure: LAPAROSCOPIC CHOLECYSTECTOMY WITH INTRAOPERATIVE CHOLANGIOGRAM;  Surgeon: Greer Pickerel, MD;  Location[facility];  Service: General;  Laterality: N/A;  . KNEE ARTHROSCOPY  2010    There were no vitals filed for this visit.      [facility] PT Assessment - 08/22/17 0900      Ambulation/Gait   Ambulation/Gait  Yes    Ambulation/Gait Assistance  6: Modified independent (Device/Increase time)    Ambulation Distance (Feet)  200 Feet    Assistive device  Straight cane;Other (Comment) AFO    Gait Pattern  Step-through pattern;Decreased arm swing - left;Decreased step length - right;Decreased stance time - left;Decreased hip/knee flexion - left;Decreased weight shift to left;Left hip hike;Antalgic    Ambulation Surface   Level;Indoor    Gait velocity  1.53 ft/sec    Stairs  Yes    Stairs Assistance  6: Modified independent (Device/Increase time)    Stairs Assistance Details (indicate cue type and reason)  sidestepping so RUE can reach rail when rail on left hemiplegic UE side    Stair Management Technique  One rail Right;One rail Left;Forwards;Sideways;Step to pattern    Number of Stairs  4 2 reps    Ramp  6: Modified independent (Device) cane & AFO    Curb  6: Modified independent (Device/increase time) cane & AFO      Dynamic Gait Index   Level Surface  Mild Impairment    Change in Gait Speed  Mild Impairment    Gait with Horizontal Head Turns  Mild Impairment    Gait with Vertical Head Turns  Mild Impairment    Gait and Pivot Turn  Mild Impairment    Step Over Obstacle  Mild Impairment    Step Around Obstacles  Mild Impairment    Steps  Moderate Impairment    Total Score  15    DGI comment:  Initial DGI 8/24      Timed Up and Go Test   Normal TUG (seconds)  16.93 cane & AFO  PT Education - 08/22/17 0900    Education Details  fall prevention strategies    Person(s) Educated  Patient    Methods  Explanation    Comprehension  Verbalized understanding       PT Short Term Goals - 08/03/17 1243      PT SHORT TERM GOAL #1   Title  Patient demonstrates & verbalizes understanding of initial HEP. All STG's due by 08/03/2017    Baseline  08/03/2017 Pt. continues to require cues for SLS at counter.    Time  3    Period  Weeks    Status  Partially Met      PT SHORT TERM GOAL #2   Title  Patient demonstrates donning AFO with verbal cues only.     Baseline  08/03/2017 Met today.    Time  3    Period  Weeks    Status  Achieved      PT SHORT TERM GOAL #3   Title  Patient ambulates with AFO & cane with head turns to scan environment safely.     Baseline  08/03/2017 Pt. continues to have toe catching, putting her at increased risk of falling. Improved with  addition of temporary toe cap.    Time  3    Period  Weeks    Status  Partially Met        PT Long Term Goals - 08/23/17 0543      PT LONG TERM GOAL #1   Title  Patient demonstrates & verbalizes ongoing HEP to address LBP, strength, balance & flexibilty.     Baseline  MET 08/22/2017    Status  Achieved      PT LONG TERM GOAL #2   Title  Patient verbalizes understanding of community fitness using Silver Sneakers.     Baseline  MET 08/22/2017    Status  Achieved      PT LONG TERM GOAL #3   Title  Timed Up & Go with AFO with cane <15sec.     Baseline  Partially MET 08/22/2017 improved TUG from 19.54sec to 16.93sec    Status  Achieved      PT LONG TERM GOAL #4   Title  Patient verbalizes fall prevention strategies including wear of AFO most of awake hours.     Baseline  MET 08/22/2017    Status  Achieved      PT LONG TERM GOAL #5   Title  Dynamic Gait Index with AFO & cane >/= 12/24    Baseline  MET 08/22/2017  DGI 15/24    Status  Achieved            Plan - 08/22/17 1800    Clinical Impression Statement  Patient met or partially met all LTGs. She has improved gait with consistent use of AFO to correct foot drop. She seems to understand HEP including [employer]. She activated her Silver Sneakers but is waiting on [employer] application to have transportation to start.     Rehab Potential  Good    Clinical Impairments Affecting Rehab Potential  left hemiplegia with limited use of LUE, lives alone normally (currently helping out church member who is homeless but is temporary & she is unable to provide assistance)    PT Frequency  2x / week    PT Duration  6 weeks    PT Treatment/Interventions  ADLs/Self Care Home Management;DME Instruction;Gait training;Stair training;Functional mobility training;Therapeutic activities;Therapeutic exercise;Balance training;Neuromuscular re-education;Patient/family education;Orthotic Fit/Training;Moist Heat;Cryotherapy    PT  Next Visit Plan  discharge PT     Consulted and Agree with Plan of Care  Patient       Patient will benefit from skilled therapeutic intervention in order to improve the following deficits and impairments:  Abnormal gait, Decreased activity tolerance, Decreased balance, Decreased coordination, Decreased endurance, Decreased mobility, Decreased strength, Postural dysfunction, Pain, Other (comment)(Orthotic dependency)  Visit Diagnosis: Hemiplegia and hemiparesis following cerebral infarction affecting left non-dominant side (HCC)  Muscle weakness (generalized)  Paralytic gait  Unsteadiness on feet     Problem List Patient Active Problem List   Diagnosis Date Noted  . Asthma 06/12/2017  . Hyperlipidemia 06/12/2017  . Seizures ([place]) 06/12/2017  . Seizure ([place]) 06/12/2017  . GERD (gastroesophageal reflux disease) 10/31/2016  . Chronic rhinitis 10/31/2016  . Chronic cholecystitis 04/12/2015  . Preoperative clearance 03/17/2015  . Spastic hemiplegia affecting nondominant side ([place]) 10/27/2013  . Adhesive capsulitis of right shoulder 10/27/2013  . Chronic diastolic heart failure ([place]) 08/26/2013  . Dyslipidemia 08/26/2013  . Borderline diabetes 08/26/2013  . Cerebral infarction ([place]) 08/26/2013  . Acute CVA (cerebrovascular accident) ([place]) 08/23/2013  . Pre-syncope 08/17/2012  . Hypokalemia 08/17/2012  . ARTHRITIS 03/28/2010  . DE QUERVAIN'S TENOSYNOVITIS 03/28/2010  . Normocytic anemia 07/26/2009  . Moderate persistent asthma 03/09/2009  . DENTAL CARIES 03/09/2009  . CANDIDIASIS OF VULVA AND VAGINA 06/04/2008  . SKIN TAG 06/04/2008  . ARTHRITIS, KNEES, BILATERAL 05/22/2008  . HYPERTENSION, BENIGN ESSENTIAL 05/04/2008  . KNEE PAIN, BILATERAL 05/04/2008  . Cough, persistent 05/04/2008  . MENISCUS TEAR, RIGHT 12/07/2005    PHYSICAL THERAPY DISCHARGE SUMMARY  Visits from Start of Care: 7  Current functional level related to goals / functional outcomes: See above   Remaining deficits: Left hemiplegia  / weakness. TUG 16.93sec still indicates fall risk.    Education / Equipment: HEP, community fitness & fall prevention including AFO wear all awake hours  Plan: Patient agrees to discharge.  Patient goals were partially met. Patient is being discharged due to meeting the stated rehab goals.  ?????         Dondi Burandt PT, DPT 08/23/2017, 5:48 AM  [facility] [address] [place], [place], [address] Phone: [phone]   Fax:  [phone]  Name: WANNETTA LANGLAND MRN: [id number] Date of Birth: [birth year]

## 2017-09-03 ENCOUNTER — Encounter: Payer: Self-pay | Admitting: Neurology

## 2017-09-03 ENCOUNTER — Other Ambulatory Visit: Payer: Self-pay

## 2017-09-03 ENCOUNTER — Ambulatory Visit: Payer: Medicare HMO | Admitting: Neurology

## 2017-09-03 VITALS — BP 126/68 | HR 91 | Ht 63.0 in | Wt 235.0 lb

## 2017-09-03 DIAGNOSIS — R251 Tremor, unspecified: Secondary | ICD-10-CM | POA: Diagnosis not present

## 2017-09-03 DIAGNOSIS — R054 Cough syncope: Secondary | ICD-10-CM

## 2017-09-03 DIAGNOSIS — R05 Cough: Secondary | ICD-10-CM | POA: Diagnosis not present

## 2017-09-03 DIAGNOSIS — I69354 Hemiplegia and hemiparesis following cerebral infarction affecting left non-dominant side: Secondary | ICD-10-CM | POA: Diagnosis not present

## 2017-09-03 DIAGNOSIS — G9608 Other cranial cerebrospinal fluid leak: Secondary | ICD-10-CM

## 2017-09-03 DIAGNOSIS — G960 Cerebrospinal fluid leak: Secondary | ICD-10-CM | POA: Diagnosis not present

## 2017-09-03 DIAGNOSIS — R55 Syncope and collapse: Secondary | ICD-10-CM

## 2017-09-03 NOTE — Progress Notes (Signed)
NEUROLOGY CONSULTATION NOTE  [name] MRN: [id number] DOB: 30-Oct-1950  Referring provider: Dr. Sander Radon Primary care provider: Dr. Beatrix Shipper  Reason for consult:  seizure  Dear Dr Cathlean Sauer:  Thank you for your kind referral of Aashna C Grundman for consultation of the above symptoms. Although her history is well known to you, please allow me to reiterate it for the purpose of our medical record. She is alone in the office today. Her daughter was called on the phone to provide additional history. Records and images were personally reviewed where available.  HISTORY OF PRESENT ILLNESS: This is a pleasant 67 year old right-handed woman with a history of hypertension, hyperlipidemia, right subcortical stroke in 2015 with residual left-sided weakness, presenting for evaluation of seizure. She was admitted to [facility] last 06/12/17 for right-sided headaches, also reporting 2 seizures episodes. They reports she did not seek medical attention, that one occurred 2 weeks prior, then the second occurred a week prior to hospitalization. On further questioning, she and her daughter report that the first episode occurred in August 2018, witnessed by her grandson. She actually went to the ER for this, records were reviewed. She reports her grandson saw her "going over." Per ER notes, she has had chronic cough and had an episode of significant coughing then slumped down. She awoke quickly and was able to resume her usual activities. She was diagnosed with cough syncope. It was noted that she had a head CT done January 2018 which showed a chronic arachnoid cyst in the anterior middle cranial fossa on the right, and chronic low-density subdural hygroma along the right convexity, maximal thickness of 1cm, with mild mass effect with 24mm right-to-left shift, no change from scan in 08/2015. Her daughter reports that the next episode of loss of consciousness with shaking was a week prior to her May 2019 hospitalization,  they were sitting on the porch when she started coughing and could not get her breath. She felt like she could not get her throat to clear. Then she started sliding down with her body shaking and "tongue hanging out of her mouth" for 3-5 minutes. She was unable to talk afterward. She was admitted to [facility] and had an MRI brain with and without contrast which I personally reviewed, no acute changes, moderate chronic microvascular disease. The 3cm arachnoid cyst in the right middle cranial fossa was unchanged. The subdural fluid collection over the right cerebral convexity measured up to 37mm in thickness consistent with hygroma, with mild right cerebral hemispheric sulcal effacement and partial right lateral ventricle effacement with 84mm of leftward midline shift. She had a normal wake and sleep EEG. She was started on Depakote with no further seizures noted. She and her daughter deny any further similar spells since May. She reports starting a medication for her cough, which has helped reduce cough. She is noted to have a raspy voice in the office today and had a coughing fit needing water. She takes liquid Depakote 250mg /54mL, taking 53mL (500mg ) TID with no side effects. She denies any further headaches, stating it was not a headache, that her head was just sore due to significant coughing. She lives alone but her daughter denies any staring/unresponsive episodes, she denies any gaps in time, olfactory/gustatory hallucinations, deja vu, rising epigastric sensation, focal numbness/tingling, myoclonic jerks. She has residual left-sided weakness and reports that PT has released her. She denies any dizziness, diplopia, dysarthria/dysphagia, neck/back pain, bowel/bladder dysfunction. She reports several falls, she fell off a bar  stool and hit her head after her stroke in 2015, she fell at church in 2016 and hit the left side of her head. Most recent fall was 3-4 weeks ago while gardening. She had a normal birth and early  development.  There is no history of febrile convulsions, CNS infections such as meningitis/encephalitis, significant traumatic brain injury, neurosurgical procedures, or family history of seizures.   PAST MEDICAL HISTORY: Past Medical History:  Diagnosis Date  . Arthritis   . Asthma   . Difficulty sleeping   . Gallstones   . Hyperlipidemia   . Hypertension   . Preoperative clearance   . Spasmodic cough    "IT COMES IN DIFFERENT SEASONS"  . Stroke ([place]) 2015   WEAKNESS L SIDE OF BODY     PAST SURGICAL HISTORY: Past Surgical History:  Procedure Laterality Date  . CHOLECYSTECTOMY N/A 04/12/2015   Procedure: LAPAROSCOPIC CHOLECYSTECTOMY WITH INTRAOPERATIVE CHOLANGIOGRAM;  Surgeon: Greer Pickerel, MD;  Location: [facility];  Service: General;  Laterality: N/A;  . KNEE ARTHROSCOPY  2010    MEDICATIONS: Current Outpatient Medications on File Prior to Visit  Medication Sig Dispense Refill  . albuterol (PROVENTIL HFA;VENTOLIN HFA) 108 (90 Base) MCG/ACT inhaler Inhale 2 puffs into the lungs every 6 (six) hours as needed for wheezing or shortness of breath. (Patient not taking: Reported on 07/16/2017) 1 Inhaler 2  . aspirin EC 81 MG tablet Take 81 mg by mouth daily.    Marland Kitchen atorvastatin (LIPITOR) 40 MG tablet Take 1 tablet (40 mg total) by mouth daily at 6 PM. (Patient not taking: Reported on 07/16/2017) 30 tablet 1  . Azelastine HCl 0.15 % SOLN 1 spray in each nostril 1-2 times daily as needed. (Patient not taking: Reported on 07/16/2017) 30 mL 5  . budesonide-formoterol (SYMBICORT) 160-4.5 MCG/ACT inhaler Inhale 2 puffs into the lungs 2 (two) times daily. 1 Inhaler 5  . Dexlansoprazole 30 MG capsule Take 1 capsule (30 mg total) by mouth daily. (Patient not taking: Reported on 07/16/2017) 30 capsule 5  . etodolac (LODINE) 400 MG tablet Take 400 mg by mouth 2 (two) times daily as needed for pain.  0  . losartan-hydrochlorothiazide (HYZAAR) 50-12.5 MG tablet Take 1 tablet by mouth at bedtime.   2  .  ranitidine (ZANTAC) 300 MG capsule Take 1 capsule (300 mg total) by mouth every evening. 30 capsule 5  . Respiratory Therapy Supplies (FLUTTER) DEVI Use as directed 1 each 0  . Valproate Sodium (DEPAKENE) 250 MG/5ML SOLN solution Take 10 mLs (500 mg total) by mouth every 8 (eight) hours. 900 mL 0   No current facility-administered medications on file prior to visit.     ALLERGIES: No Known Allergies  FAMILY HISTORY: Family History  Problem Relation Age of Onset  . Diabetes Mother   . Stomach cancer Father     SOCIAL HISTORY: Social History   Socioeconomic History  . Marital status: Single    Spouse name: Not on file  . Number of children: Not on file  . Years of education: Not on file  . Highest education level: Not on file  Occupational History  . Not on file  Social Needs  . Financial resource strain: Not on file  . Food insecurity:    Worry: Not on file    Inability: Not on file  . Transportation needs:    Medical: Not on file    Non-medical: Not on file  Tobacco Use  . Smoking status: Former Smoker  Last attempt to quit: 04/08/1994    Years since quitting: 23.4  . Smokeless tobacco: Never Used  Substance and Sexual Activity  . Alcohol use: No  . Drug use: No  . Sexual activity: Not on file  Lifestyle  . Physical activity:    Days per week: Not on file    Minutes per session: Not on file  . Stress: Not on file  Relationships  . Social connections:    Talks on phone: Not on file    Gets together: Not on file    Attends religious service: Not on file    Active member of club or organization: Not on file    Attends meetings of clubs or organizations: Not on file    Relationship status: Not on file  . Intimate partner violence:    Fear of current or ex partner: Not on file    Emotionally abused: Not on file    Physically abused: Not on file    Forced sexual activity: Not on file  Other Topics Concern  . Not on file  Social History Narrative   Pt lives  alone in 1 story home   Has 4 daughters   11th grade education   Retired custodian    REVIEW OF SYSTEMS: Constitutional: No fevers, chills, or sweats, no generalized fatigue, change in appetite Eyes: No visual changes, double vision, eye pain Ear, nose and throat: No hearing loss, ear pain, nasal congestion, sore throat Cardiovascular: No chest pain, palpitations Respiratory:  No shortness of breath at rest or with exertion, wheezes GastrointestinaI: No nausea, vomiting, diarrhea, abdominal pain, fecal incontinence Genitourinary:  No dysuria, urinary retention or frequency Musculoskeletal:  No neck pain, back pain Integumentary: No rash, pruritus, skin lesions Neurological: as above Psychiatric: No depression, insomnia, anxiety Endocrine: No palpitations, fatigue, diaphoresis, mood swings, change in appetite, change in weight, increased thirst Hematologic/Lymphatic:  No anemia, purpura, petechiae. Allergic/Immunologic: no itchy/runny eyes, nasal congestion, recent allergic reactions, rashes  PHYSICAL EXAM: Vitals:   09/03/17 1029  BP: 126/68  Pulse: 91  SpO2: 95%   General: No acute distress Head:  Normocephalic/atraumatic Eyes: Fundoscopic exam shows bilateral sharp discs, no vessel changes, exudates, or hemorrhages Neck: supple, no paraspinal tenderness, full range of motion Back: No paraspinal tenderness Heart: regular rate and rhythm Lungs: Clear to auscultation bilaterally. Vascular: No carotid bruits. Skin/Extremities: No rash, no edema Neurological Exam: Mental status: alert and oriented to person, place, and time, no dysarthria or aphasia, Fund of knowledge is appropriate.  Recent and remote memory are intact.  Attention and concentration are normal.    Able to name objects and repeat phrases. Cranial nerves: CN I: not tested CN II: pupils equal, round and reactive to light, visual fields intact, fundi unremarkable. CN III, IV, VI:  full range of motion, no  nystagmus, no ptosis CN V: facial sensation intact CN VII: upper and lower face symmetric CN VIII: hearing intact to finger rub CN IX, X: gag intact, uvula midline CN XI: sternocleidomastoid and trapezius muscles intact CN XII: tongue midline Bulk & Tone: normal, no fasciculations. Motor: 5/5 on right UE and LE, 3+/5 left UE in pyramidal distribution, 4-/5 left LE with left foot dorsiflexion weakness Sensation: intact to light touch, cold, pin, vibration and joint position sense.  No extinction to double simultaneous stimulation.  Romberg test negative Deep Tendon Reflexes: +2 on right UE, brisk +3 on left UE and LE, no ankle clonus Plantar responses: downgoing bilaterally Cerebellar: no incoordination on finger  to nose on right, difficulty with left due to weakness Gait: hemiparetic gait with circumduction of left leg Tremor: none  IMPRESSION: This is a 67 year old right-handed woman with a history of hypertension, hyperlipidemia, right subcortical stroke in 2015 with residual left-sided weakness, with 2 episodes of loss of consciousness with shaking that both occurred during significant coughing bouts. The episodes are more suggestive of cough syncope with associated convulsive syncope rather than seizure, however we discussed that she does have a risk factor for seizures with abnormal brain MRI showing 3cm arachnoid cyst in the right middle cranial fossa and 1cm right hygroma with mild right cerebral hemispheric sulcal effacement and partial right lateral ventricle effacement with 56mm of leftward midline shift. EEG normal. We discussed doing a 24-hour EEG to further classify her symptoms. A repeat 43-month MRI brain with and without contrast will be scheduled to assess for interval change. Continue Depakote as prescribed for now.  driving laws were discussed with the patient, and she knows to stop driving after an episode of loss of consciousness, until 6 months seizure-free. She will follow-up  in 6 months and knows to call for any changes.   Thank you for allowing me to participate in the care of this patient. Please do not hesitate to call for any questions or concerns.   Ellouise Newer, M.D.  CC: Dr. Cathlean Sauer, Dr. Criss Rosales

## 2017-09-03 NOTE — Patient Instructions (Signed)
1. Schedule 24-hour EEG 2. Schedule repeat MRI brain with and without contrast for November 2019 (10-month follow-up scan) 3. Continue Depakote as prescribed 4. As per [place] driving laws, no driving after an episode of loss of responsiveness until 6 months event-free 5. Follow-up in 6 months, call for any changes

## 2017-09-11 DIAGNOSIS — Z1231 Encounter for screening mammogram for malignant neoplasm of breast: Secondary | ICD-10-CM | POA: Diagnosis not present

## 2017-09-14 DIAGNOSIS — E7849 Other hyperlipidemia: Secondary | ICD-10-CM | POA: Diagnosis not present

## 2017-09-14 DIAGNOSIS — M179 Osteoarthritis of knee, unspecified: Secondary | ICD-10-CM | POA: Diagnosis not present

## 2017-09-14 DIAGNOSIS — J4520 Mild intermittent asthma, uncomplicated: Secondary | ICD-10-CM | POA: Diagnosis not present

## 2017-09-14 DIAGNOSIS — R569 Unspecified convulsions: Secondary | ICD-10-CM | POA: Diagnosis not present

## 2017-09-14 DIAGNOSIS — I10 Essential (primary) hypertension: Secondary | ICD-10-CM | POA: Diagnosis not present

## 2017-09-17 ENCOUNTER — Encounter: Payer: Self-pay | Admitting: Neurology

## 2017-09-27 ENCOUNTER — Encounter (HOSPITAL_COMMUNITY): Payer: Self-pay

## 2017-09-27 ENCOUNTER — Emergency Department (HOSPITAL_COMMUNITY)
Admission: EM | Admit: 2017-09-27 | Discharge: 2017-09-27 | Disposition: A | Discharge status: Home / Self Care | Payer: Medicare HMO | Attending: Emergency Medicine | Admitting: Emergency Medicine

## 2017-09-27 ENCOUNTER — Emergency Department (HOSPITAL_COMMUNITY): Payer: Medicare HMO

## 2017-09-27 ENCOUNTER — Emergency Department (HOSPITAL_BASED_OUTPATIENT_CLINIC_OR_DEPARTMENT_OTHER): Payer: Medicare HMO

## 2017-09-27 ENCOUNTER — Other Ambulatory Visit: Payer: Self-pay

## 2017-09-27 DIAGNOSIS — I5032 Chronic diastolic (congestive) heart failure: Secondary | ICD-10-CM | POA: Insufficient documentation

## 2017-09-27 DIAGNOSIS — M79604 Pain in right leg: Secondary | ICD-10-CM | POA: Diagnosis not present

## 2017-09-27 DIAGNOSIS — M545 Low back pain: Secondary | ICD-10-CM | POA: Diagnosis not present

## 2017-09-27 DIAGNOSIS — Z8673 Personal history of transient ischemic attack (TIA), and cerebral infarction without residual deficits: Secondary | ICD-10-CM | POA: Diagnosis not present

## 2017-09-27 DIAGNOSIS — I110 Hypertensive heart disease with heart failure: Secondary | ICD-10-CM | POA: Insufficient documentation

## 2017-09-27 DIAGNOSIS — S79911A Unspecified injury of right hip, initial encounter: Secondary | ICD-10-CM | POA: Diagnosis not present

## 2017-09-27 DIAGNOSIS — Z87891 Personal history of nicotine dependence: Secondary | ICD-10-CM | POA: Insufficient documentation

## 2017-09-27 DIAGNOSIS — M79609 Pain in unspecified limb: Secondary | ICD-10-CM | POA: Diagnosis not present

## 2017-09-27 DIAGNOSIS — Z79899 Other long term (current) drug therapy: Secondary | ICD-10-CM | POA: Insufficient documentation

## 2017-09-27 DIAGNOSIS — Z9049 Acquired absence of other specified parts of digestive tract: Secondary | ICD-10-CM | POA: Diagnosis not present

## 2017-09-27 DIAGNOSIS — R52 Pain, unspecified: Secondary | ICD-10-CM | POA: Diagnosis not present

## 2017-09-27 DIAGNOSIS — Z7982 Long term (current) use of aspirin: Secondary | ICD-10-CM | POA: Insufficient documentation

## 2017-09-27 DIAGNOSIS — J45909 Unspecified asthma, uncomplicated: Secondary | ICD-10-CM | POA: Diagnosis not present

## 2017-09-27 DIAGNOSIS — M25551 Pain in right hip: Secondary | ICD-10-CM | POA: Diagnosis not present

## 2017-09-27 DIAGNOSIS — R5381 Other malaise: Secondary | ICD-10-CM | POA: Diagnosis not present

## 2017-09-27 DIAGNOSIS — S3992XA Unspecified injury of lower back, initial encounter: Secondary | ICD-10-CM | POA: Diagnosis not present

## 2017-09-27 MED ORDER — BISACODYL 5 MG PO TBEC: 5.0000 mg | DELAYED_RELEASE_TABLET | Freq: Every day | ORAL | 0 refills | Status: DC

## 2017-09-27 MED ORDER — HYDROCODONE-ACETAMINOPHEN 5-325 MG PO TABS: 1.0000 | ORAL_TABLET | Freq: Four times a day (QID) | ORAL | 0 refills | Status: DC | PRN

## 2017-09-27 MED ORDER — ACETAMINOPHEN 325 MG PO TABS: 650.0000 mg | ORAL_TABLET | Freq: Once | ORAL | Status: DC

## 2017-09-27 MED ORDER — MORPHINE SULFATE (PF) 4 MG/ML IV SOLN
4.0000 mg | Freq: Once | INTRAVENOUS | Status: AC
  Administered 2017-09-27: 4 mg via INTRAVENOUS
  Filled 2017-09-27: qty 1

## 2017-09-27 NOTE — ED Triage Notes (Signed)
Patient c/o right leg pain that started yesterday. Patient denies any injury.

## 2017-09-27 NOTE — Progress Notes (Signed)
*  Preliminary Results* Right lower extremity venous duplex completed. Right lower extremity is negative for deep vein thrombosis. There is no evidence of right Baker's cyst.  09/27/2017 4:29 PM  Maudry Mayhew, MHA, RVT, RDCS, RDMS

## 2017-09-27 NOTE — ED Provider Notes (Signed)
[facility] DEPT Provider Note   CSN: [id number] Arrival date & time: 09/27/17  1062     History   Chief Complaint Chief Complaint  Patient presents with  . Leg Pain    HPI [name] is a 67 y.o. female with a past medical history of stroke, spastic hemiplegia, arthritis, who presents today for evaluation of right-sided leg and hip pain.  She reports that she started noticing that it was hurting yesterday in her right groin, medial upper leg, and lower back.  She denies any numbness or tingling across upper inner thighs or genitals.  She denies any changes to bowel or bladder function.  She has taken her home obit, however has not had any Tylenol today.  She denies any fevers or chills.  She denies any possible injury.  She says she is unable to bear weight secondary to pain.  HPI  Past Medical History:  Diagnosis Date  . Arthritis   . Asthma   . Difficulty sleeping   . Gallstones   . Hyperlipidemia   . Hypertension   . Preoperative clearance   . Spasmodic cough    "IT COMES IN DIFFERENT SEASONS"  . Stroke ([place]) 2015   WEAKNESS L SIDE OF BODY     Patient Active Problem List   Diagnosis Date Noted  . Asthma 06/12/2017  . Hyperlipidemia 06/12/2017  . Seizures ([place]) 06/12/2017  . Seizure ([place]) 06/12/2017  . GERD (gastroesophageal reflux disease) 10/31/2016  . Chronic rhinitis 10/31/2016  . Chronic cholecystitis 04/12/2015  . Preoperative clearance 03/17/2015  . Spastic hemiplegia affecting nondominant side ([place]) 10/27/2013  . Adhesive capsulitis of right shoulder 10/27/2013  . Chronic diastolic heart failure ([place]) 08/26/2013  . Dyslipidemia 08/26/2013  . Borderline diabetes 08/26/2013  . Cerebral infarction ([place]) 08/26/2013  . Acute CVA (cerebrovascular accident) ([place]) 08/23/2013  . Pre-syncope 08/17/2012  . Hypokalemia 08/17/2012  . ARTHRITIS 03/28/2010  . DE QUERVAIN'S TENOSYNOVITIS 03/28/2010  . Normocytic anemia 07/26/2009  .  Moderate persistent asthma 03/09/2009  . DENTAL CARIES 03/09/2009  . CANDIDIASIS OF VULVA AND VAGINA 06/04/2008  . SKIN TAG 06/04/2008  . ARTHRITIS, KNEES, BILATERAL 05/22/2008  . HYPERTENSION, BENIGN ESSENTIAL 05/04/2008  . KNEE PAIN, BILATERAL 05/04/2008  . Cough, persistent 05/04/2008  . MENISCUS TEAR, RIGHT 12/07/2005    Past Surgical History:  Procedure Laterality Date  . CHOLECYSTECTOMY N/A 04/12/2015   Procedure: LAPAROSCOPIC CHOLECYSTECTOMY WITH INTRAOPERATIVE CHOLANGIOGRAM;  Surgeon: Greer Pickerel, MD;  Location: [facility];  Service: General;  Laterality: N/A;  . KNEE ARTHROSCOPY  2010     OB History   None      Home Medications    Prior to Admission medications   Medication Sig Start Date End Date Taking? Authorizing Provider  aspirin EC 81 MG tablet Take 81 mg by mouth daily.   Yes [provider]  BREO ELLIPTA 200-25 MCG/INH AEPB Inhale 1 puff into the lungs daily.  09/14/17  Yes [provider]  budesonide-formoterol (SYMBICORT) 160-4.5 MCG/ACT inhaler Inhale 2 puffs into the lungs 2 (two) times daily. 10/31/16  Yes Bobbitt, Sedalia Muta, MD  etodolac (LODINE) 400 MG tablet Take 400 mg by mouth daily as needed for mild pain.  05/18/17  Yes [provider]  losartan-hydrochlorothiazide (HYZAAR) 50-12.5 MG tablet Take 1 tablet by mouth daily.  05/24/15  Yes [provider]  montelukast (SINGULAIR) 10 MG tablet Take 10 mg by mouth at bedtime.  08/13/17  Yes [provider]  ranitidine (ZANTAC)  300 MG capsule Take 1 capsule (300 mg total) by mouth every evening. 10/31/16  Yes Bobbitt, Sedalia Muta, MD  valproic acid (DEPAKENE) 250 MG/5ML SOLN solution Take 500 mg by mouth 3 (three) times daily.  08/13/17  Yes [provider]  albuterol (PROVENTIL HFA;VENTOLIN HFA) 108 (90 Base) MCG/ACT inhaler Inhale 2 puffs into the lungs every 6 (six) hours as needed for wheezing or shortness of breath. Patient not taking: Reported on 07/16/2017  10/31/16   Bobbitt, Sedalia Muta, MD  atorvastatin (LIPITOR) 40 MG tablet Take 1 tablet (40 mg total) by mouth daily at 6 PM. Patient not taking: Reported on 07/16/2017 09/18/13   Angiulli, Lavon Paganini, PA-C  Azelastine HCl 0.15 % SOLN 1 spray in each nostril 1-2 times daily as needed. Patient not taking: Reported on 07/16/2017 10/31/16   Bobbitt, Sedalia Muta, MD  bisacodyl (DULCOLAX) 5 MG EC tablet Take 1 tablet (5 mg total) by mouth daily. 09/27/17   Lorin Glass, PA-C  Dexlansoprazole 30 MG capsule Take 1 capsule (30 mg total) by mouth daily. Patient not taking: Reported on 07/16/2017 10/31/16   Bobbitt, Sedalia Muta, MD  HYDROcodone-acetaminophen (NORCO/VICODIN) 5-325 MG tablet Take 1 tablet by mouth every 6 (six) hours as needed for severe pain. 09/27/17   Lorin Glass, PA-C  Respiratory Therapy Supplies (FLUTTER) DEVI Use as directed 10/31/16   Bobbitt, Sedalia Muta, MD  Valproate Sodium (DEPAKENE) 250 MG/5ML SOLN solution Take 10 mLs (500 mg total) by mouth every 8 (eight) hours. 06/14/17 07/14/17  Arrien, Jimmy Picket, MD    Family History Family History  Problem Relation Age of Onset  . Diabetes Mother   . Stomach cancer Father     Social History Social History   Tobacco Use  . Smoking status: Former Smoker    Last attempt to quit: 04/08/1994    Years since quitting: 23.4  . Smokeless tobacco: Never Used  Substance Use Topics  . Alcohol use: No  . Drug use: No     Allergies   Patient has no known allergies.   Review of Systems Review of Systems  Constitutional: Negative for chills and fever.  Gastrointestinal: Negative for abdominal pain, nausea and vomiting.  Genitourinary: Negative for dysuria.  Musculoskeletal: Positive for arthralgias and back pain. Negative for neck pain and neck stiffness.  Neurological: Negative for headaches.  All other systems reviewed and are negative.    Physical Exam Updated Vital Signs BP 129/63   Pulse 92   Temp 98.8 F  (37.1 C) (Oral)   Resp 16   Ht 5\' 3"  (1.6 m)   Wt (!) 149.7 kg   SpO2 96%   BMI 58.46 kg/m   Physical Exam  Constitutional: She appears well-developed and well-nourished. No distress.  HENT:  Head: Normocephalic and atraumatic.  Mouth/Throat: Oropharynx is clear and moist.  Eyes: Conjunctivae are normal. Right eye exhibits no discharge. Left eye exhibits no discharge. No scleral icterus.  Neck: Normal range of motion.  Cardiovascular: Normal rate, regular rhythm and intact distal pulses.  Pulmonary/Chest: Effort normal. No stridor. No respiratory distress.  Abdominal: Soft. Bowel sounds are normal. She exhibits no distension. There is no tenderness. There is no guarding.  Musculoskeletal: She exhibits no edema or deformity.  There is TTP over right lower back lumbar paraspinal muscles.  There is no midline TTP, step offs or deformity.  There is TTP over the medial thigh.  Unable to range hip secondary to pain.  She is unable to lift  leg up secondary to pain.    Neurological: She is alert. She exhibits normal muscle tone.  Sensation intact to bilateral lower extremities.   Skin: Skin is warm and dry. She is not diaphoretic.  Psychiatric: She has a normal mood and affect. Her behavior is normal.  Nursing note and vitals reviewed.    ED Treatments / Results  Labs (all labs ordered are listed, but only abnormal results are displayed) Labs Reviewed - No data to display  EKG None  Radiology Dg Lumbar Spine Complete  Result Date: 09/27/2017 CLINICAL DATA:  67 year old female with increasing lumbar spine and right hip pain after fall 2 weeks ago. EXAM: LUMBAR SPINE - COMPLETE 4+ VIEW COMPARISON:  Lumbar radiographs 12/12/2014. FINDINGS: Normal lumbar segmentation. Stable lumbar vertebral height and alignment. Relatively preserved lumbar lordosis. Chronic lower thoracic and lumbar endplate spurring despite relatively preserved disc spaces. Mild chronic disc space loss at L5-S1. No pars  fracture. Mild to moderate L4-L5 and L5-S1 facet hypertrophy is chronic. No acute osseous abnormality identified. The small upper abdominal surgical clips are visible on the lateral view. Negative visible bowel gas pattern. IMPRESSION: 1.  No acute osseous abnormality identified in the lumbar spine. 2. Chronic lumbar endplate and facet degeneration appears stable since the 2016. Electronically Signed   By: Genevie Ann M.D.   On: 09/27/2017 15:37   Dg Hip Unilat W Or Wo Pelvis 2-3 Views Right  Result Date: 09/27/2017 CLINICAL DATA:  67 year old female with increasing lumbar spine and right hip pain after fall 2 weeks ago. EXAM: DG HIP (WITH OR WITHOUT PELVIS) 2-3V RIGHT COMPARISON:  Pelvis and right hip series 12/12/2014. FINDINGS: Femoral heads remain normally located. Hip joint spaces remains symmetric and normal. Bone mineralization is within normal limits for age. The pelvis appears stable and intact. Sacral ala and SI joints appear stable. Stable proximal left femur. The proximal right femur is stable and intact. Negative visible bowel gas pattern and abdominal visceral contours. IMPRESSION: No acute fracture or dislocation identified about the right hip or pelvis. Electronically Signed   By: Genevie Ann M.D.   On: 09/27/2017 15:34    Procedures Procedures (including critical care time)  Medications Ordered in ED Medications  acetaminophen (TYLENOL) tablet 650 mg (650 mg Oral Not Given 09/27/17 1443)  morphine 4 MG/ML injection 4 mg (4 mg Intravenous Given 09/27/17 1442)     Initial Impression / Assessment and Plan / ED Course  I have reviewed the triage vital signs and the nursing notes.  Pertinent labs & imaging results that were available during my care of the patient were reviewed by me and considered in my medical decision making (see chart for details).    She presents today for evaluation of right-sided hip and medial thigh pain.  X-rays were obtained of hip and lower back without acute  abnormalities.  She was given morphine, and Tylenol in the department, after which she was unable to bear normal weight on the leg still secondary to pain.  When shuffling side to side on the bed she was able to push off of that leg slightly.  Range of motion is limited secondary to pain, however she is able to flex the leg to approximately 60 degrees.    Exam is not consistent with septic arthritis, she is afebrile here.  This patient was seen as a shared visit with Dr. Venora Maples.  Plan to obtain venous duplex to evaluate for DVT, and CT hip to evaluate for additional pathology.  PMP queried for patient.   At shift change care was transferred to [facility] who will follow pending studies, re-evaulate and determine disposition.     Final Clinical Impressions(s) / ED Diagnoses   Final diagnoses:  Right leg pain    ED Discharge Orders         Ordered    HYDROcodone-acetaminophen (NORCO/VICODIN) 5-325 MG tablet  Every 6 hours PRN     09/27/17 1607    bisacodyl (DULCOLAX) 5 MG EC tablet  Daily     09/27/17 1607           Ollen Gross 09/27/17 1610    Jola Schmidt, MD 09/28/17 [phone]

## 2017-09-27 NOTE — ED Provider Notes (Signed)
  Physical Exam  BP (!) 154/82 (BP Location: Right Arm)   Pulse 81   Temp 98.8 F (37.1 C) (Oral)   Resp 18   Ht 5\' 3"  (1.6 m)   Wt (!) 149.7 kg   SpO2 97%   BMI 58.46 kg/m   Physical Exam  ED Course/Procedures   Clinical Course as of Sep 27 1757  Thu Sep 27, 2017  1617 Case discussed with Wyn Quaker, PA-C at shift change. Waiting results from CT hip and venous U/S to evaluate for causes to atraumatic hip pain. Plan to discharge if results from imaging are negative.   [GM]  6226 Right lower extremity negative for DVT.   [GM]  3335 CT hip resulted and showed no fractures, dislocations or infection infiltrating bone or muscle.   [GM]    Clinical Course User Index [GM] Romie Jumper, PA-C   Patient is 67 yo female with history of stroke, spastic hemiplegia and arthritis who presented with right hip pain x1 day. Denies recent injury, trauma or fall to the lower extremity. Patient unable to bear weight because of pain. Patient has no risk factors for osteoporosis or AVN such as EtOH use or chronic steroid use that would cause an acute fracture with minimal effort. After clinical evaluation and medical record review, patient's pain is likely due MSK strain.    MDM  1. Right Lower Extremity Pain. MSK etiology. Education provided on OTC and supportive treatment for pain relief. Rx for [employer] written by Wyn Quaker, PA-C. Advised to follow-up with PCP.  Dispo: Home. After thorough clinical evaluation, this patient is determined to be medically stable and can be safely discharged with the previously mentioned treatment and/or outpatient follow-up/referral(s). At this time, there are no other apparent medical conditions that require further screening, evaluation or treatment.       Junita Push 09/27/17 [place], [place], DO 09/27/17 2357

## 2017-09-27 NOTE — ED Triage Notes (Signed)
Per PT[facility]--states right thigh pain since yesterdays-difficulty  bearing weight today-no trauma /injury-

## 2017-09-27 NOTE — Discharge Instructions (Addendum)
Your imaging today was normal. There was no signs of any fractures or dislocations. There is no blood clot in your leg either. Pain that you are experiencing is likely due to muscle strain.  Please take Tylenol (acetaminophen) to relieve your pain.  You may take tylenol, up to 1,000 mg (two extra strength pills).  Do not take more than 3,000 mg tylenol in a 24 hour period.  Please check all medication labels as many medications such as pain and cold medications may contain tylenol. Please do not drink alcohol while taking this medication.   Today you received medications that may make you sleepy or impair your ability to make decisions.  For the next 24 hours please do not drive, operate heavy machinery, care for a small child with out another adult present, or perform any activities that may cause harm to you or someone else if you were to fall asleep or be impaired.   Narcotics will cause you to be constipated.  Please follow up with up with your doctor.    You are being prescribed a medication which may make you sleepy. Please follow up of listed precautions for at least 24 hours after taking one dose.

## 2017-09-29 ENCOUNTER — Inpatient Hospital Stay: Admission: RE | Admit: 2017-09-29 | Payer: Medicare HMO | Source: Ambulatory Visit

## 2017-10-04 ENCOUNTER — Telehealth: Payer: Self-pay | Admitting: Neurology

## 2017-10-04 NOTE — Telephone Encounter (Signed)
Patient has some questions about the MRI test that she is going to have done.

## 2017-10-04 NOTE — Telephone Encounter (Signed)
Returned call to pt.  No answer.  LMOM asking for return call.  

## 2017-10-12 ENCOUNTER — Ambulatory Visit
Admission: RE | Admit: 2017-10-12 | Discharge: 2017-10-12 | Disposition: A | Discharge status: Home / Self Care | Payer: Medicare HMO | Source: Ambulatory Visit | Attending: Neurology | Admitting: Neurology

## 2017-10-12 DIAGNOSIS — G960 Cerebrospinal fluid leak: Secondary | ICD-10-CM

## 2017-10-12 DIAGNOSIS — R054 Cough syncope: Secondary | ICD-10-CM

## 2017-10-12 DIAGNOSIS — R55 Syncope and collapse: Secondary | ICD-10-CM

## 2017-10-12 DIAGNOSIS — G9608 Other cranial cerebrospinal fluid leak: Secondary | ICD-10-CM

## 2017-10-12 DIAGNOSIS — R251 Tremor, unspecified: Secondary | ICD-10-CM

## 2017-10-12 DIAGNOSIS — I69354 Hemiplegia and hemiparesis following cerebral infarction affecting left non-dominant side: Secondary | ICD-10-CM

## 2017-10-12 DIAGNOSIS — R05 Cough: Secondary | ICD-10-CM

## 2017-10-19 DIAGNOSIS — J4520 Mild intermittent asthma, uncomplicated: Secondary | ICD-10-CM | POA: Diagnosis not present

## 2017-10-19 DIAGNOSIS — E7849 Other hyperlipidemia: Secondary | ICD-10-CM | POA: Diagnosis not present

## 2017-10-19 DIAGNOSIS — I10 Essential (primary) hypertension: Secondary | ICD-10-CM | POA: Diagnosis not present

## 2017-10-19 DIAGNOSIS — Z131 Encounter for screening for diabetes mellitus: Secondary | ICD-10-CM | POA: Diagnosis not present

## 2017-10-19 DIAGNOSIS — J302 Other seasonal allergic rhinitis: Secondary | ICD-10-CM | POA: Diagnosis not present

## 2017-10-19 DIAGNOSIS — Z23 Encounter for immunization: Secondary | ICD-10-CM | POA: Diagnosis not present

## 2017-11-07 DIAGNOSIS — H5203 Hypermetropia, bilateral: Secondary | ICD-10-CM | POA: Diagnosis not present

## 2017-11-07 DIAGNOSIS — Z0100 Encounter for examination of eyes and vision without abnormal findings: Secondary | ICD-10-CM | POA: Diagnosis not present

## 2018-04-11 ENCOUNTER — Other Ambulatory Visit: Payer: Self-pay

## 2018-04-11 ENCOUNTER — Ambulatory Visit (INDEPENDENT_AMBULATORY_CARE_PROVIDER_SITE_OTHER): Payer: Medicare HMO | Admitting: Neurology

## 2018-04-11 ENCOUNTER — Encounter: Payer: Self-pay | Admitting: Neurology

## 2018-04-11 VITALS — BP 140/76 | HR 80 | Ht 63.0 in | Wt 236.0 lb

## 2018-04-11 DIAGNOSIS — G9608 Other cranial cerebrospinal fluid leak: Secondary | ICD-10-CM

## 2018-04-11 DIAGNOSIS — R251 Tremor, unspecified: Secondary | ICD-10-CM

## 2018-04-11 DIAGNOSIS — G960 Cerebrospinal fluid leak: Secondary | ICD-10-CM | POA: Diagnosis not present

## 2018-04-11 NOTE — Progress Notes (Signed)
NEUROLOGY FOLLOW UP OFFICE NOTE  [name] [id number] February 01, 1951  HISTORY OF PRESENT ILLNESS: I had the pleasure of seeing [name] in follow-up in the neurology clinic on 04/11/2018.  The patient was last seen in July 2019 for "seizures" in May 2019, however on further questioning the episodes of loss of consciousness with shaking both occurred during significant coughing bouts, suggestive of convulsive syncope/cough syncope. She had been started in the hospital on Depakote due to abnormal MRI brain showing 3cm arachnoid cyst in the right middle cranial fossa and 1cm right hygroma with mild right cerebral hemispheric sulcal effacement and partial right lateral ventricle effacement with 45mm of leftward midline shift. EEG normal. We had discussed repeating MRI and doing a 24-hour EEG, however she was unable to do both tests. She reports that the Depakote was not refilled last month so she has been off Depakote. She denies any further episodes of passing out. She has still had bad coughing spells. She has found that sweets and not chewing her food well would trigger the cough. She denies any gaps in time, olfactory/gustatory hallucinations, myoclonic jerks. No headaches, dizziness, vision changes, no falls.   History on Initial Assessment 09/03/2017: This is a pleasant 68 year old right-handed woman with a history of hypertension, hyperlipidemia, right subcortical stroke in 2015 with residual left-sided weakness, presenting for evaluation of seizure. She was admitted to [facility] last 06/12/17 for right-sided headaches, also reporting 2 seizures episodes. They reports she did not seek medical attention, that one occurred 2 weeks prior, then the second occurred a week prior to hospitalization. On further questioning, she and her daughter report that the first episode occurred in August 2018, witnessed by her grandson. She actually went to the ER for this, records were reviewed. She reports her grandson saw her  "going over." Per ER notes, she has had chronic cough and had an episode of significant coughing then slumped down. She awoke quickly and was able to resume her usual activities. She was diagnosed with cough syncope. It was noted that she had a head CT done January 2018 which showed a chronic arachnoid cyst in the anterior middle cranial fossa on the right, and chronic low-density subdural hygroma along the right convexity, maximal thickness of 1cm, with mild mass effect with 50mm right-to-left shift, no change from scan in 08/2015. Her daughter reports that the next episode of loss of consciousness with shaking was a week prior to her May 2019 hospitalization, they were sitting on the porch when she started coughing and could not get her breath. She felt like she could not get her throat to clear. Then she started sliding down with her body shaking and "tongue hanging out of her mouth" for 3-5 minutes. She was unable to talk afterward. She was admitted to [facility] and had an MRI brain with and without contrast which I personally reviewed, no acute changes, moderate chronic microvascular disease. The 3cm arachnoid cyst in the right middle cranial fossa was unchanged. The subdural fluid collection over the right cerebral convexity measured up to 1mm in thickness consistent with hygroma, with mild right cerebral hemispheric sulcal effacement and partial right lateral ventricle effacement with 13mm of leftward midline shift. She had a normal wake and sleep EEG. She was started on Depakote with no further seizures noted. She and her daughter deny any further similar spells since May. She reports starting a medication for her cough, which has helped reduce cough. She is noted to have a raspy  voice in the office today and had a coughing fit needing water. She takes liquid Depakote 250mg /90mL, taking 62mL (500mg ) TID with no side effects. She denies any further headaches, stating it was not a headache, that her head was just sore  due to significant coughing. She lives alone but her daughter denies any staring/unresponsive episodes, she denies any gaps in time, olfactory/gustatory hallucinations, deja vu, rising epigastric sensation, focal numbness/tingling, myoclonic jerks. She has residual left-sided weakness and reports that PT has released her. She denies any dizziness, diplopia, dysarthria/dysphagia, neck/back pain, bowel/bladder dysfunction. She reports several falls, she fell off a bar stool and hit her head after her stroke in 2015, she fell at church in 2016 and hit the left side of her head. Most recent fall was 3-4 weeks ago while gardening. She had a normal birth and early development.  There is no history of febrile convulsions, CNS infections such as meningitis/encephalitis, significant traumatic brain injury, neurosurgical procedures, or family history of seizures.  PAST MEDICAL HISTORY: Past Medical History:  Diagnosis Date  . Arthritis   . Asthma   . Difficulty sleeping   . Gallstones   . Hyperlipidemia   . Hypertension   . Preoperative clearance   . Spasmodic cough    "IT COMES IN DIFFERENT SEASONS"  . Stroke ([place]) 2015   WEAKNESS L SIDE OF BODY     MEDICATIONS: Current Outpatient Medications on File Prior to Visit  Medication Sig Dispense Refill  . albuterol (PROVENTIL HFA;VENTOLIN HFA) 108 (90 Base) MCG/ACT inhaler Inhale 2 puffs into the lungs every 6 (six) hours as needed for wheezing or shortness of breath. (Patient not taking: Reported on 07/16/2017) 1 Inhaler 2  . aspirin EC 81 MG tablet Take 81 mg by mouth daily.    Marland Kitchen atorvastatin (LIPITOR) 40 MG tablet Take 1 tablet (40 mg total) by mouth daily at 6 PM. (Patient not taking: Reported on 07/16/2017) 30 tablet 1  . Azelastine HCl 0.15 % SOLN 1 spray in each nostril 1-2 times daily as needed. (Patient not taking: Reported on 07/16/2017) 30 mL 5  . bisacodyl (DULCOLAX) 5 MG EC tablet Take 1 tablet (5 mg total) by mouth daily. 14 tablet 0  . BREO  ELLIPTA 200-25 MCG/INH AEPB Inhale 1 puff into the lungs daily.   5  . budesonide-formoterol (SYMBICORT) 160-4.5 MCG/ACT inhaler Inhale 2 puffs into the lungs 2 (two) times daily. 1 Inhaler 5  . Dexlansoprazole 30 MG capsule Take 1 capsule (30 mg total) by mouth daily. (Patient not taking: Reported on 07/16/2017) 30 capsule 5  . etodolac (LODINE) 400 MG tablet Take 400 mg by mouth daily as needed for mild pain.   0  . HYDROcodone-acetaminophen (NORCO/VICODIN) 5-325 MG tablet Take 1 tablet by mouth every 6 (six) hours as needed for severe pain. 6 tablet 0  . losartan-hydrochlorothiazide (HYZAAR) 50-12.5 MG tablet Take 1 tablet by mouth daily.   2  . montelukast (SINGULAIR) 10 MG tablet Take 10 mg by mouth at bedtime.   2  . ranitidine (ZANTAC) 300 MG capsule Take 1 capsule (300 mg total) by mouth every evening. 30 capsule 5  . Respiratory Therapy Supplies (FLUTTER) DEVI Use as directed 1 each 0  . Valproate Sodium (DEPAKENE) 250 MG/5ML SOLN solution Take 10 mLs (500 mg total) by mouth every 8 (eight) hours. 900 mL 0  . valproic acid (DEPAKENE) 250 MG/5ML SOLN solution Take 500 mg by mouth 3 (three) times daily.   2   No  current facility-administered medications on file prior to visit.     ALLERGIES: No Known Allergies  FAMILY HISTORY: Family History  Problem Relation Age of Onset  . Diabetes Mother   . Stomach cancer Father     SOCIAL HISTORY: Social History   Socioeconomic History  . Marital status: Single    Spouse name: Not on file  . Number of children: Not on file  . Years of education: Not on file  . Highest education level: Not on file  Occupational History  . Not on file  Social Needs  . Financial resource strain: Not on file  . Food insecurity:    Worry: Not on file    Inability: Not on file  . Transportation needs:    Medical: Not on file    Non-medical: Not on file  Tobacco Use  . Smoking status: Former Smoker    Last attempt to quit: 04/08/1994    Years since  quitting: 24.0  . Smokeless tobacco: Never Used  Substance and Sexual Activity  . Alcohol use: No  . Drug use: No  . Sexual activity: Not on file  Lifestyle  . Physical activity:    Days per week: Not on file    Minutes per session: Not on file  . Stress: Not on file  Relationships  . Social connections:    Talks on phone: Not on file    Gets together: Not on file    Attends religious service: Not on file    Active member of club or organization: Not on file    Attends meetings of clubs or organizations: Not on file    Relationship status: Not on file  . Intimate partner violence:    Fear of current or ex partner: Not on file    Emotionally abused: Not on file    Physically abused: Not on file    Forced sexual activity: Not on file  Other Topics Concern  . Not on file  Social History Narrative   Pt lives alone in 1 story home   Has 4 daughters   11th grade education   Retired custodian    REVIEW OF SYSTEMS: Constitutional: No fevers, chills, or sweats, no generalized fatigue, change in appetite Eyes: No visual changes, double vision, eye pain Ear, nose and throat: No hearing loss, ear pain, nasal congestion, sore throat Cardiovascular: No chest pain, palpitations Respiratory:  No shortness of breath at rest or with exertion, wheezes GastrointestinaI: No nausea, vomiting, diarrhea, abdominal pain, fecal incontinence Genitourinary:  No dysuria, urinary retention or frequency Musculoskeletal:  No neck pain, back pain Integumentary: No rash, pruritus, skin lesions Neurological: as above Psychiatric: No depression, insomnia, anxiety Endocrine: No palpitations, fatigue, diaphoresis, mood swings, change in appetite, change in weight, increased thirst Hematologic/Lymphatic:  No anemia, purpura, petechiae. Allergic/Immunologic: no itchy/runny eyes, nasal congestion, recent allergic reactions, rashes  PHYSICAL EXAM: Vitals:   04/11/18 1214  BP: 140/76  Pulse: 80  SpO2: 96%    General: No acute distress Head:  Normocephalic/atraumatic Neck: supple, no paraspinal tenderness, full range of motion Back: No paraspinal tenderness Heart: regular rate and rhythm Lungs: Clear to auscultation bilaterally. Vascular: No carotid bruits. Skin/Extremities: No rash, no edema Neurological Exam: Mental status: alert and oriented to person, place, and time, no dysarthria or aphasia, Fund of knowledge is appropriate.  Recent and remote memory are intact.  Attention and concentration are normal.    Able to name objects and repeat phrases. Cranial nerves: CN I: not tested  CN II: pupils equal, round and reactive to light, visual fields intact CN III, IV, VI:  full range of motion, no nystagmus, no ptosis CN V: facial sensation intact CN VII: upper and lower face symmetric CN VIII: hearing intact to conversation Bulk & Tone: normal, no fasciculations. Motor: 5/5 on right UE and LE, 4-/5 left UE in pyramidal distribution, 4-/5 left LE with left foot dorsiflexion weakness (similar to prior Sensation: intact to light touch Cerebellar: no incoordination on finger to nose on right, difficulty on left due to weakness Gait: hemiparetic gait with circumduction of left leg (similar to prior), uses a cane Tremor: none  IMPRESSION: This is a pleasant 68 yo RH woman with a history of hypertension, hyperlipidemia, right subcortical stroke in 2015 with residual left-sided weakness, who had 2 episodes of loss of consciousness with shaking in May 2019 that both occurred during significant coughing bouts. We again discussed that episodes are more suggestive of cough syncope with associated convulsive syncope rather than seizure. She does have risk factors for seizure with prior stroke and 3cm arachnoid cyst in the right middle cranial fossa and 1cm right hygroma with mild right cerebral hemispheric sulcal effacement and partial right lateral ventricle effacement with 15mm of leftward midline shift.  EEG normal. Would proceed with interval MRI brain with and without contrast to assess for stability. She has stopped Depakote last month. She denies any further syncopal episodes since May. She is aware of [place] driving laws to stop driving after an episode of loss of consciousness, until 6 months event-free. She was driving after the stroke in 2015. I will speak to her daughter regarding any driving concerns aside from syncope, if daughter raises concern, would recommend a driving evaluation. She will follow-up in 6 months and knows to call for any changes.   Thank you for allowing me to participate in her care.  Please do not hesitate to call for any questions or concerns.  The duration of this appointment visit was 30 minutes of face-to-face time with the patient.  Greater than 50% of this time was spent in counseling, explanation of diagnosis, planning of further management, and coordination of care.   Ellouise Newer, M.D.   CC: Dr. Jeanie Cooks

## 2018-04-11 NOTE — Patient Instructions (Addendum)
1. Schedule open MRI brain with and without contrast  We have sent a referral for your OPEN MRI to [facility].  They will contact you directly to schedule your appointment.  [facility] is located at [address], [place], [place] [address].  If you need to speak with [facility] for any reason, they can be reached at [phone]  2. Avoid triggers for cough 3. As per [place] driving laws, no driving until 6 months event-free. I will speak to your daughter about driving concerns 4. Follow-up in 6 months or so, call for any changes

## 2018-04-16 ENCOUNTER — Telehealth: Payer: Self-pay | Admitting: Neurology

## 2018-04-16 NOTE — Telephone Encounter (Signed)
Left VM with daughter to call back to discuss mother's wishes to return to driving

## 2018-04-29 ENCOUNTER — Telehealth: Payer: Self-pay | Admitting: Neurology

## 2018-04-29 NOTE — Telephone Encounter (Signed)
[facility] said they needed physician signature on recent orders sent. Please refax this to fax#: [phone]. Thanks!

## 2018-04-30 NOTE — Telephone Encounter (Signed)
Recent orders signed and faxed back to number provided below.

## 2018-06-05 NOTE — Telephone Encounter (Signed)
No callback from daughter, will close encounter

## 2018-11-26 ENCOUNTER — Ambulatory Visit (INDEPENDENT_AMBULATORY_CARE_PROVIDER_SITE_OTHER): Payer: Medicare HMO | Admitting: Neurology

## 2018-11-26 ENCOUNTER — Encounter: Payer: Self-pay | Admitting: Neurology

## 2018-11-26 ENCOUNTER — Other Ambulatory Visit: Payer: Self-pay

## 2018-11-26 VITALS — BP 134/76 | HR 94 | Ht 63.0 in | Wt 235.0 lb

## 2018-11-26 DIAGNOSIS — G9608 Other cranial cerebrospinal fluid leak: Secondary | ICD-10-CM | POA: Diagnosis not present

## 2018-11-26 DIAGNOSIS — R05 Cough: Secondary | ICD-10-CM

## 2018-11-26 DIAGNOSIS — R251 Tremor, unspecified: Secondary | ICD-10-CM | POA: Diagnosis not present

## 2018-11-26 DIAGNOSIS — R054 Cough syncope: Secondary | ICD-10-CM

## 2018-11-26 DIAGNOSIS — R55 Syncope and collapse: Secondary | ICD-10-CM
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
## 2018-11-26 NOTE — Patient Instructions (Signed)
1. Schedule open MRI brain with and without contrast  2. Follow-up with PCP regarding continued coughing spells  3. Follow-up in 6 months, call for any changes

## 2018-12-11 ENCOUNTER — Telehealth: Payer: Self-pay | Admitting: Neurology

## 2018-12-11 NOTE — Telephone Encounter (Signed)
MRI brain with and without contrast done at [facility] on 12/10/2018:  No acute changes, right cerebral convexity subdural hygroma measures up to 1.2cm in maximum thickness, mild local mass effect; arachnoid cyst in the right inferior frontal and anterior temporal convexity measures up to 3.4cm transverse by 3.1cm AP by 4cm craniocaudad.

## 2019-02-12 ENCOUNTER — Encounter: Payer: Self-pay | Admitting: Gastroenterology

## 2019-02-12 ENCOUNTER — Other Ambulatory Visit: Payer: Self-pay | Admitting: Internal Medicine

## 2019-02-12 DIAGNOSIS — E2839 Other primary ovarian failure: Secondary | ICD-10-CM

## 2019-02-15 ENCOUNTER — Other Ambulatory Visit: Payer: Self-pay

## 2019-02-15 ENCOUNTER — Encounter (HOSPITAL_COMMUNITY): Payer: Self-pay | Admitting: *Deleted

## 2019-02-15 ENCOUNTER — Ambulatory Visit (HOSPITAL_COMMUNITY)
Admission: EM | Admit: 2019-02-15 | Discharge: 2019-02-15 | Disposition: A | Discharge status: Home / Self Care | Payer: Medicare HMO | Attending: Emergency Medicine | Admitting: Emergency Medicine

## 2019-02-15 ENCOUNTER — Ambulatory Visit (INDEPENDENT_AMBULATORY_CARE_PROVIDER_SITE_OTHER): Payer: Medicare HMO

## 2019-02-15 DIAGNOSIS — M1711 Unilateral primary osteoarthritis, right knee: Secondary | ICD-10-CM | POA: Diagnosis not present

## 2019-02-15 DIAGNOSIS — M25461 Effusion, right knee: Secondary | ICD-10-CM

## 2019-02-15 MED ORDER — IBUPROFEN 600 MG PO TABS: 600.0000 mg | ORAL_TABLET | Freq: Four times a day (QID) | ORAL | 0 refills | Status: DC | PRN

## 2019-02-15 MED ORDER — HYDROCODONE-ACETAMINOPHEN 5-325 MG PO TABS: 1.0000 | ORAL_TABLET | Freq: Four times a day (QID) | ORAL | 0 refills | Status: DC | PRN

## 2019-02-15 NOTE — ED Notes (Signed)
Placed 6 inch ace wrap to rt knee w/o complication

## 2019-02-15 NOTE — ED Triage Notes (Signed)
Pt in wheechair.  States normally is ambulatory, though she does have some left hemiparesis from CVA.

## 2019-02-15 NOTE — ED Provider Notes (Signed)
HPI  SUBJECTIVE:  [name] is a 69 y.o. female who presents with right knee pain and swelling starting yesterday.  She describes the pain as constant soreness and it is located in the entire knee.  Reports increased temperature.  No erythema.  No change in her physical activity, does not recall any trauma.  No fevers, body aches, distal numbness or tingling.  She reports limitation of motion secondary to pain.  She states that she has significant difficulty weightbearing due to pain.  She tried her daughter's ibuprofen 800 mg with improvement in her symptoms.  Symptoms are worse with weightbearing, walking.  She called her PMD and was told to come to the urgent care for x-rays.  She has a past medical history of osteoarthritis in bilateral knees, right meniscal tear, stroke, CHF, borderline diabetes, seizures.  No history of chronic kidney disease, gout, septic joint, pseudogout.  PMD: Nolene Ebbs, MD   Past Medical History:  Diagnosis Date  . Arthritis   . Asthma   . Difficulty sleeping   . Gallstones   . Hyperlipidemia   . Hypertension   . Preoperative clearance   . Spasmodic cough    "IT COMES IN DIFFERENT SEASONS"  . Stroke ([place]) 2015   WEAKNESS L SIDE OF BODY     Past Surgical History:  Procedure Laterality Date  . CHOLECYSTECTOMY N/A 04/12/2015   Procedure: LAPAROSCOPIC CHOLECYSTECTOMY WITH INTRAOPERATIVE CHOLANGIOGRAM;  Surgeon: Greer Pickerel, MD;  Location: [facility];  Service: General;  Laterality: N/A;  . gall stones    . KNEE ARTHROSCOPY  2010    Family History  Problem Relation Age of Onset  . Diabetes Mother   . Stomach cancer Father     Social History   Tobacco Use  . Smoking status: Former Smoker    Quit date: 04/08/1994    Years since quitting: 24.8  . Smokeless tobacco: Never Used  Substance Use Topics  . Alcohol use: No  . Drug use: No    No current facility-administered medications for this encounter.  Current Outpatient Medications:  .  albuterol  (PROVENTIL HFA;VENTOLIN HFA) 108 (90 Base) MCG/ACT inhaler, Inhale 2 puffs into the lungs every 6 (six) hours as needed for wheezing or shortness of breath., Disp: 1 Inhaler, Rfl: 2 .  amLODipine (NORVASC) 5 MG tablet, TK 1 T PO QD, Disp: , Rfl:  .  aspirin EC 81 MG tablet, Take 81 mg by mouth daily., Disp: , Rfl:  .  atorvastatin (LIPITOR) 40 MG tablet, Take 1 tablet (40 mg total) by mouth daily at 6 PM., Disp: 30 tablet, Rfl: 1 .  Azelastine HCl 0.15 % SOLN, 1 spray in each nostril 1-2 times daily as needed., Disp: 30 mL, Rfl: 5 .  bisacodyl (DULCOLAX) 5 MG EC tablet, Take 1 tablet (5 mg total) by mouth daily., Disp: 14 tablet, Rfl: 0 .  BREO ELLIPTA 200-25 MCG/INH AEPB, Inhale 1 puff into the lungs daily. , Disp: , Rfl: 5 .  budesonide-formoterol (SYMBICORT) 160-4.5 MCG/ACT inhaler, Inhale 2 puffs into the lungs 2 (two) times daily., Disp: 1 Inhaler, Rfl: 5 .  Dexlansoprazole 30 MG capsule, Take 1 capsule (30 mg total) by mouth daily., Disp: 30 capsule, Rfl: 5 .  HYDROcodone-acetaminophen (NORCO/VICODIN) 5-325 MG tablet, Take 1-2 tablets by mouth every 6 (six) hours as needed for moderate pain or severe pain., Disp: 12 tablet, Rfl: 0 .  ibuprofen (ADVIL) 600 MG tablet, Take 1 tablet (600 mg total) by mouth every 6 (  six) hours as needed., Disp: 30 tablet, Rfl: 0 .  montelukast (SINGULAIR) 10 MG tablet, Take 10 mg by mouth at bedtime. , Disp: , Rfl: 2 .  ranitidine (ZANTAC) 300 MG capsule, Take 1 capsule (300 mg total) by mouth every evening., Disp: 30 capsule, Rfl: 5 .  Respiratory Therapy Supplies (FLUTTER) DEVI, Use as directed, Disp: 1 each, Rfl: 0  No Known Allergies   ROS  As noted in HPI.   Physical Exam  BP (!) 145/100   Pulse 83   Temp 98.2 F (36.8 C) (Oral)   Resp 20   SpO2 99%   Constitutional: Well developed, well nourished, no acute distress Eyes:  EOMI, conjunctiva normal bilaterally HENT: Normocephalic, atraumatic,mucus membranes moist Respiratory: Normal  inspiratory effort Cardiovascular: Normal rate GI: nondistended skin: No rash, skin intact Musculoskeletal: Limited knee exam.  Had to examine patient in chair, was unable to get up onto the table due to pain and residual left-sided weakness from her CVA.  R Knee ROM decreased due to pain, Flexion  intact, suprapatellar tenderness, Patella tender,  Patellar tendon tender, Medial joint  tender, Lateral joint tender, Popliteal region NT, Varus MCL stress testing stable but painful, Valgus LCL stress testing stable, McMurray's testing normal, Lachman's negative. Distal NVI with intact baseline sensation / motor / pulse distal to knee. Positive small effusion. No erythema.  Positive increased temperature. No crepitus.  Neurologic: Alert & oriented x 3, no focal neuro deficits Psychiatric: Speech and behavior appropriate   ED Course   Medications - No data to display  Orders Placed This Encounter  Procedures  . DG Knee Complete 4 Views Right    Standing Status:   Standing    Number of Occurrences:   1    Order Specific Question:   Reason for Exam (SYMPTOM  OR DIAGNOSIS REQUIRED)    Answer:   difuse knee pain h/o OA, . r/o effusion, acute changes.  Marland Kitchen Apply ace wrap    Standing Status:   Standing    Number of Occurrences:   1    No results found for this or any previous visit (from the past 24 hour(s)). DG Knee Complete 4 Views Right  Result Date: 02/15/2019 CLINICAL DATA:  Right knee pain.  No known injury. EXAM: RIGHT KNEE - COMPLETE 4+ VIEW COMPARISON:  None. FINDINGS: Degenerative changes with joint space narrowing and spurring in all 3 compartments. Small joint effusion. No acute bony abnormality. Specifically, no fracture, subluxation, or dislocation. IMPRESSION: Mild tricompartment degenerative changes. Small joint effusion. No acute bony abnormality. Electronically Signed   By: Rolm Baptise M.D.   On: 02/15/2019 17:03    ED Clinical Impression  1. Effusion, right knee   2.  Osteoarthritis of right knee, unspecified osteoarthritis type      ED Assessment/Plan  Afebrile, but also took ibuprofen within 4 to 6 hours of evaluation.  Her knee is slightly swollen, does have increased temperature, but no erythema.  Able to flex it to 90 degrees without any problem although she has pain with extension.  Doubt septic joint, gout.  Will x-ray knee.  Will reevaluate.  Reviewed imaging independently.  Degenerative changes with joint space narrowing and spurring in all 3 compartments.  Small joint effusion.  No acute bony abnormalities.  See radiology report for details.   Narcotic database reviewed for this patient, and feel that the risk/benefit ratio today is favorable for proceeding with a prescription for controlled substance.  Last opiate prescription was in  August 2019.  Patient with a small joint effusion, degenerative changes in her knee.  Suspect the effusion is the arthritis.  Will send home with an Ace wrap, ibuprofen 600 mg combined with Tylenol containing product 3-4 times a day as needed for pain.  Ibuprofen with 1 g of Tylenol for mild to moderate pain, ibuprofen with 1 Norco for severe pain.  Will advise a cane.  Follow-up with Dr. Marlou Sa at [facility].  Strict ER return precautions given  Discussed imaging, MDM, treatment plan, and plan for follow-up with patient. Discussed sn/sx that should prompt return to the ED. patient agrees with plan.   Meds ordered this encounter  Medications  . ibuprofen (ADVIL) 600 MG tablet    Sig: Take 1 tablet (600 mg total) by mouth every 6 (six) hours as needed.    Dispense:  30 tablet    Refill:  0  . HYDROcodone-acetaminophen (NORCO/VICODIN) 5-325 MG tablet    Sig: Take 1-2 tablets by mouth every 6 (six) hours as needed for moderate pain or severe pain.    Dispense:  12 tablet    Refill:  0    *This clinic note was created using [employer]. Therefore, there may be occasional mistakes despite  careful proofreading.   ?   Melynda Ripple, MD 02/16/19 [phone]

## 2019-02-15 NOTE — Discharge Instructions (Addendum)
You have some fluid in your knee, but I suspect this is from arthritis.  Use the Ace wrap as needed for comfort and you may use a compressive knee sleeve.  I would advise a cane to take the pressure off of your knee.  Take ibuprofen 600 mg with a Tylenol containing product 3 or 4 times a day as needed for pain. Ibuprofen with 1 g of Tylenol for mild to moderate pain, ibuprofen with 1 Norco for severe pain.  Follow-up with Dr. Marlou Sa as soon as you possibly can, you may need to have the fluid in your knee drained.  Go immediately to the ER for fevers above 100.4, redness in your knee, worsening swelling, pain not controlled with Tylenol/Norco, or for other concerns.

## 2019-02-15 NOTE — ED Triage Notes (Signed)
Denies injury.  C/O starting with right knee pain yesterday.  C/O warmth to knee last night.

## 2019-02-20 ENCOUNTER — Ambulatory Visit (INDEPENDENT_AMBULATORY_CARE_PROVIDER_SITE_OTHER): Payer: Medicare HMO | Admitting: Orthopedic Surgery

## 2019-02-20 ENCOUNTER — Encounter: Payer: Self-pay | Admitting: Orthopedic Surgery

## 2019-02-20 ENCOUNTER — Telehealth: Payer: Self-pay

## 2019-02-20 ENCOUNTER — Ambulatory Visit (INDEPENDENT_AMBULATORY_CARE_PROVIDER_SITE_OTHER): Payer: Medicare HMO

## 2019-02-20 ENCOUNTER — Other Ambulatory Visit: Payer: Self-pay

## 2019-02-20 DIAGNOSIS — M170 Bilateral primary osteoarthritis of knee: Secondary | ICD-10-CM

## 2019-02-20 DIAGNOSIS — M25562 Pain in left knee: Secondary | ICD-10-CM | POA: Diagnosis not present

## 2019-02-20 MED ORDER — LIDOCAINE HCL 1 % IJ SOLN
5.0000 mL | INTRAMUSCULAR | Status: AC | PRN
  Administered 2019-02-20: 5 mL

## 2019-02-20 MED ORDER — BUPIVACAINE HCL 0.25 % IJ SOLN
4.0000 mL | INTRAMUSCULAR | Status: AC | PRN
  Administered 2019-02-20: 4 mL via INTRA_ARTICULAR

## 2019-02-20 MED ORDER — METHYLPREDNISOLONE ACETATE 40 MG/ML IJ SUSP
40.0000 mg | INTRAMUSCULAR | Status: AC | PRN
  Administered 2019-02-20: 20:00:00 40 mg via INTRA_ARTICULAR

## 2019-02-20 MED ORDER — METHYLPREDNISOLONE ACETATE 40 MG/ML IJ SUSP
40.0000 mg | INTRAMUSCULAR | Status: AC | PRN
  Administered 2019-02-20: 40 mg via INTRA_ARTICULAR

## 2019-02-20 NOTE — Progress Notes (Signed)
Office Visit Note   Patient: [name]           Date of Birth: [birth year]           MRN: [id number] Visit Date: 02/20/2019 Requested by: Nolene Ebbs, MD [address] [place],  [place] [address] PCP: Nolene Ebbs, MD  Subjective: Chief Complaint  Patient presents with  . Right Knee - Pain  . Left Knee - Pain    HPI: [name] is a 69 y.o. female who presents to the office complaining of bilateral knee pain.  Patient notes about 2 years of pain.  She complains that her left knee typically hurts worse than her right knee.  However the right knee has been hurting more than the left knee recently.  She notes posterior medial left knee pain and medial right knee pain.  She has been recently seen at urgent care about 5 days ago for increased right knee pain and pain with weightbearing.  She has since improved since going to urgent care.  She denies any recent injuries or falls.    She does have a history of left-sided stroke with some residual weakness on that side.  She also notes occasional weakness and giving way of the knees but denies any mechanical symptoms.  She can walk about 10 to 15 minutes before she has to take a break due to her back pain and knee pain.  She also notes left-sided lateral hip pain that is worse with sleeping on that side.  She denies any groin pain or radicular leg symptoms but does note a history of low back pain.  She has been taking ibuprofen and hydrocodone with some relief.  She has never had an injection into either knee..                ROS:  All systems reviewed are negative as they relate to the chief complaint within the history of present illness.  Patient denies fevers or chills.  Assessment & Plan: Visit Diagnoses:  1. Bilateral primary osteoarthritis of knee     Plan: Patient is a 69 year old female who presents to the office complaining of bilateral knee pain over the past 2 years.  She notes that her left knee has been historically  bothered her more than her right knee.  She does not have any mechanical symptoms but notes pain that wakes her up at night and is worse with walking, causing her to have to take breaks from walking.  She has been controlling her symptoms with ibuprofen and occasional hydrocodone but this has not been successful recently.  X-rays reveal significant arthritis worse on the left knee.  Degenerative changes are worst in the medial compartment bilaterally and is bone-on-bone in the left knee.  Patient has never had injections into her knees.  Recommended that she try injections due to her night pain and rest pain.  Patient agreed with the plan and tolerated the injections well.  We will preapproved her for bilateral gel injections and she will return to the office in 6 weeks for clinical recheck.  This patient is diagnosed with osteoarthritis of the knee(s).    Radiographs show evidence of joint space narrowing, osteophytes, subchondral sclerosis and/or subchondral cysts.  This patient has knee pain which interferes with functional and activities of daily living.    This patient has experienced inadequate response, adverse effects and/or intolerance with conservative treatments such as acetaminophen, NSAIDS, topical creams, physical therapy or regular exercise, knee  bracing and/or weight loss.   This patient has experienced inadequate response or has a contraindication to intra articular steroid injections for at least 3 months.   This patient is not scheduled to have a total knee replacement within 6 months of starting treatment with viscosupplementation.   Follow-Up Instructions: No follow-ups on file.   Orders:  Orders Placed This Encounter  Procedures  . XR Knee 1-2 Views Left   No orders of the defined types were placed in this encounter.     Procedures: Large Joint Inj: bilateral knee on 02/20/2019 8:19 PM Indications: diagnostic evaluation, joint swelling and pain Details: 18 G 1.5 in  needle, superolateral approach  Arthrogram: No  Medications (Right): 5 mL lidocaine 1 %; 4 mL bupivacaine 0.25 %; 40 mg methylPREDNISolone acetate 40 MG/ML Medications (Left): 5 mL lidocaine 1 %; 4 mL bupivacaine 0.25 %; 40 mg methylPREDNISolone acetate 40 MG/ML Outcome: tolerated well, no immediate complications Procedure, treatment alternatives, risks and benefits explained, specific risks discussed. Consent was given by the patient. Immediately prior to procedure a time out was called to verify the correct patient, procedure, equipment, support staff and site/side marked as required. Patient was prepped and draped in the usual sterile fashion.       Clinical Data: No additional findings.  Objective: Vital Signs: There were no vitals taken for this visit.  Physical Exam:  Constitutional: Patient appears well-developed HEENT:  Head: Normocephalic Eyes:EOM are normal Neck: Normal range of motion Cardiovascular: Normal rate Pulmonary/chest: Effort normal Neurologic: Patient is alert Skin: Skin is warm Psychiatric: Patient has normal mood and affect  Ortho Exam:  Bilateral knee Exam Positive effusion of right knee Extensor mechanism intact No TTP over the quad tendon, patellar tendon, pes anserinus, patella, tibial tubercle, LCL/MCL insertions Tender to palpation over the medial and lateral joint lines bilaterally, most tender over the medial joint line of the left knee.  Baker's cyst present in the left knee and tender over the cyst. Stable to varus/valgus stresses.  Stable to anterior/posterior drawer Extension to 0 degrees Flexion > 90 degrees  Specialty Comments:  No specialty comments available.  Imaging: XR Knee 1-2 Views Left  Result Date: 02/20/2019 AP lateral left knee reviewed.  End-stage tricompartmental arthritis is present worse in the patellofemoral and medial compartments.  Varus alignment is present.  No acute fracture or dislocation.    PMFS History:  Patient Active Problem List   Diagnosis Date Noted  . Asthma 06/12/2017  . Hyperlipidemia 06/12/2017  . Seizures ([place]) 06/12/2017  . Seizure ([place]) 06/12/2017  . GERD (gastroesophageal reflux disease) 10/31/2016  . Chronic rhinitis 10/31/2016  . Chronic cholecystitis 04/12/2015  . Preoperative clearance 03/17/2015  . Spastic hemiplegia affecting nondominant side ([place]) 10/27/2013  . Adhesive capsulitis of right shoulder 10/27/2013  . Chronic diastolic heart failure ([place]) 08/26/2013  . Dyslipidemia 08/26/2013  . Borderline diabetes 08/26/2013  . Cerebral infarction ([place]) 08/26/2013  . Acute CVA (cerebrovascular accident) ([place]) 08/23/2013  . Pre-syncope 08/17/2012  . Hypokalemia 08/17/2012  . ARTHRITIS 03/28/2010  . DE QUERVAIN'S TENOSYNOVITIS 03/28/2010  . Normocytic anemia 07/26/2009  . Moderate persistent asthma 03/09/2009  . DENTAL CARIES 03/09/2009  . CANDIDIASIS OF VULVA AND VAGINA 06/04/2008  . SKIN TAG 06/04/2008  . ARTHRITIS, KNEES, BILATERAL 05/22/2008  . HYPERTENSION, BENIGN ESSENTIAL 05/04/2008  . KNEE PAIN, BILATERAL 05/04/2008  . Cough, persistent 05/04/2008  . MENISCUS TEAR, RIGHT 12/07/2005   Past Medical History:  Diagnosis Date  . Arthritis   . Asthma   .  Difficulty sleeping   . Gallstones   . Hyperlipidemia   . Hypertension   . Preoperative clearance   . Spasmodic cough    "IT COMES IN DIFFERENT SEASONS"  . Stroke ([place]) 2015   WEAKNESS L SIDE OF BODY     Family History  Problem Relation Age of Onset  . Diabetes Mother   . Stomach cancer Father     Past Surgical History:  Procedure Laterality Date  . CHOLECYSTECTOMY N/A 04/12/2015   Procedure: LAPAROSCOPIC CHOLECYSTECTOMY WITH INTRAOPERATIVE CHOLANGIOGRAM;  Surgeon: Greer Pickerel, MD;  Location: [facility];  Service: General;  Laterality: N/A;  . gall stones    . KNEE ARTHROSCOPY  2010   Social History   Occupational History  . Not on file  Tobacco Use  . Smoking status: Former Smoker    Quit  date: 04/08/1994    Years since quitting: 24.8  . Smokeless tobacco: Never Used  Substance and Sexual Activity  . Alcohol use: No  . Drug use: No  . Sexual activity: Not on file

## 2019-02-20 NOTE — Telephone Encounter (Signed)
Can we get patient approved for bilateral gel inejctions

## 2019-02-21 ENCOUNTER — Telehealth: Payer: Self-pay

## 2019-02-21 NOTE — Telephone Encounter (Signed)
Submitted for VOB for Bilateral knee Monovisc Injections

## 2019-02-21 NOTE — Telephone Encounter (Signed)
Submit for Verification of Benefits.  Thank you

## 2019-02-25 ENCOUNTER — Telehealth: Payer: Self-pay

## 2019-02-25 NOTE — Telephone Encounter (Signed)
Approved for Bilateral knee Monovisc injections Buy and Bill $25 copay 20% OOP Auth need via [employer] Auth # [id number] Auth from 04/03/19-05-03-19

## 2019-02-26 ENCOUNTER — Ambulatory Visit (AMBULATORY_SURGERY_CENTER): Payer: Self-pay | Admitting: *Deleted

## 2019-02-26 ENCOUNTER — Other Ambulatory Visit: Payer: Self-pay

## 2019-02-26 VITALS — Temp 96.6°F | Ht 63.0 in | Wt 230.0 lb

## 2019-02-26 DIAGNOSIS — Z01818 Encounter for other preprocedural examination: Secondary | ICD-10-CM

## 2019-02-26 DIAGNOSIS — Z1211 Encounter for screening for malignant neoplasm of colon: Secondary | ICD-10-CM

## 2019-02-26 MED ORDER — NA SULFATE-K SULFATE-MG SULF 17.5-3.13-1.6 GM/177ML PO SOLN: ORAL | 0 refills | Status: DC

## 2019-02-26 NOTE — Progress Notes (Signed)
Patient is here in-person for PV. Patient denies any allergies to eggs or soy. Patient denies any problems with anesthesia/sedation. Patient denies any oxygen use at home. Patient denies taking any diet/weight loss medications or blood thinners. Patient is not being treated for MRSA or C-diff. EMMI education assisgned to the patient for the procedure, this was explained and instructions given to patient. NO COVID-19 screening test. patient was covid +  On 01/20/2019.    Pt is aware that care partner will wait in the car during procedure; if they feel like they will be too hot or cold to wait in the car; they may wait in the 4 th floor lobby. Patient is aware to bring only one care partner. We want them to wear a mask (we do not have any that we can provide them), practice social distancing, and we will check their temperatures when they get here.  I did remind the patient that their care partner needs to stay in the parking lot the entire time and have a cell phone available, we will call them when the pt is ready for discharge. Patient will wear mask into building.

## 2019-02-28 ENCOUNTER — Encounter: Payer: Self-pay | Admitting: Gastroenterology

## 2019-03-11 ENCOUNTER — Other Ambulatory Visit: Payer: Self-pay

## 2019-03-11 ENCOUNTER — Encounter: Payer: Self-pay | Admitting: Gastroenterology

## 2019-03-11 ENCOUNTER — Ambulatory Visit (AMBULATORY_SURGERY_CENTER): Payer: Medicare HMO | Admitting: Gastroenterology

## 2019-03-11 VITALS — BP 154/80 | HR 88 | Temp 96.8°F | Resp 20 | Ht 63.0 in | Wt 230.0 lb

## 2019-03-11 DIAGNOSIS — D125 Benign neoplasm of sigmoid colon: Secondary | ICD-10-CM

## 2019-03-11 DIAGNOSIS — Z1211 Encounter for screening for malignant neoplasm of colon: Secondary | ICD-10-CM

## 2019-03-11 MED ORDER — SODIUM CHLORIDE 0.9 % IV SOLN: 500.0000 mL | Freq: Once | INTRAVENOUS | Status: DC

## 2019-03-11 NOTE — Progress Notes (Signed)
Pt's states no medical or surgical changes since previsit or office visit. 

## 2019-03-11 NOTE — Progress Notes (Signed)
Called to room to assist during endoscopic procedure.  Patient ID and intended procedure confirmed with present staff. Received instructions for my participation in the procedure from the performing physician.  

## 2019-03-11 NOTE — Progress Notes (Signed)
Report given to PACU, vss 

## 2019-03-11 NOTE — Patient Instructions (Signed)
Handouts provided on polyps, diverticulosis, and hemorrhoids.  YOU HAD AN ENDOSCOPIC PROCEDURE TODAY AT [facility]:   Refer to the procedure report that was given to you for any specific questions about what was found during the examination.  If the procedure report does not answer your questions, please call your gastroenterologist to clarify.  If you requested that your care partner not be given the details of your procedure findings, then the procedure report has been included in a sealed envelope for you to review at your convenience later.  YOU SHOULD EXPECT: Some feelings of bloating in the abdomen. Passage of more gas than usual.  Walking can help get rid of the air that was put into your GI tract during the procedure and reduce the bloating. If you had a lower endoscopy (such as a colonoscopy or flexible sigmoidoscopy) you may notice spotting of blood in your stool or on the toilet paper. If you underwent a bowel prep for your procedure, you may not have a normal bowel movement for a few days.  Please Note:  You might notice some irritation and congestion in your nose or some drainage.  This is from the oxygen used during your procedure.  There is no need for concern and it should clear up in a day or so.  SYMPTOMS TO REPORT IMMEDIATELY:   Following lower endoscopy (colonoscopy or flexible sigmoidoscopy):  Excessive amounts of blood in the stool  Significant tenderness or worsening of abdominal pains  Swelling of the abdomen that is new, acute  Fever of 100F or higher   For urgent or emergent issues, a gastroenterologist can be reached at any hour by calling [phone].   DIET:  We do recommend a small meal at first, but then you may proceed to your regular diet.  Drink plenty of fluids but you should avoid alcoholic beverages for 24 hours.  ACTIVITY:  You should plan to take it easy for the rest of today and you should NOT DRIVE or use heavy machinery until  tomorrow (because of the sedation medicines used during the test).    FOLLOW UP: Our staff will call the number listed on your records 48-72 hours following your procedure to check on you and address any questions or concerns that you may have regarding the information given to you following your procedure. If we do not reach you, we will leave a message.  We will attempt to reach you two times.  During this call, we will ask if you have developed any symptoms of COVID 19. If you develop any symptoms (ie: fever, flu-like symptoms, shortness of breath, cough etc.) before then, please call [phone].  If you test positive for Covid 19 in the 2 weeks post procedure, please call and report this information to [place].    If any biopsies were taken you will be contacted by phone or by letter within the next 1-3 weeks.  Please call us at [phone] if you have not heard about the biopsies in 3 weeks.    SIGNATURES/CONFIDENTIALITY: You and/or your care partner have signed paperwork which will be entered into your electronic medical record.  These signatures attest to the fact that that the information above on your After Visit Summary has been reviewed and is understood.  Full responsibility of the confidentiality of this discharge information lies with you and/or your care-partner.

## 2019-03-11 NOTE — Op Note (Signed)
[facility] Patient Name: [name] Procedure Date: 03/11/2019 10:22 AM MRN: [id number] Endoscopist: Mauri Pole , MD Age: 69 Referring MD:  Date of Birth: 1950/05/01 Gender: Female Account #: [id number] Procedure:                Colonoscopy Indications:              Screening for colorectal malignant neoplasm Medicines:                Monitored Anesthesia Care Procedure:                Pre-Anesthesia Assessment:                           - Prior to the procedure, a History and Physical                            was performed, and patient medications and                            allergies were reviewed. The patient's tolerance of                            previous anesthesia was also reviewed. The risks                            and benefits of the procedure and the sedation                            options and risks were discussed with the patient.                            All questions were answered, and informed consent                            was obtained. Prior Anticoagulants: The patient has                            taken no previous anticoagulant or antiplatelet                            agents. ASA Grade Assessment: II - A patient with                            mild systemic disease. After reviewing the risks                            and benefits, the patient was deemed in                            satisfactory condition to undergo the procedure.                           After obtaining informed consent, the colonoscope  was passed under direct vision. Throughout the                            procedure, the patient's blood pressure, pulse, and                            oxygen saturations were monitored continuously. The                            Colonoscope was introduced through the anus and                            advanced to the the cecum, identified by                            appendiceal orifice and  ileocecal valve. The                            colonoscopy was performed without difficulty. The                            patient tolerated the procedure well. The quality                            of the bowel preparation was excellent. The                            ileocecal valve, appendiceal orifice, and rectum                            were photographed. Scope In: 10:36:11 AM Scope Out: 10:54:22 AM Scope Withdrawal Time: 0 hours 10 minutes 55 seconds  Total Procedure Duration: 0 hours 18 minutes 11 seconds  Findings:                 The perianal and digital rectal examinations were                            normal.                           A 1 mm polyp was found in the sigmoid colon. The                            polyp was sessile. The polyp was removed with a                            cold biopsy forceps. Resection and retrieval were                            complete.                           A 5 mm polyp was found in the sigmoid colon. The  polyp was sessile. The polyp was removed with a                            cold snare. Resection and retrieval were complete.                           A few small-mouthed diverticula were found in the                            sigmoid colon and descending colon.                           Non-bleeding internal hemorrhoids were found during                            retroflexion. The hemorrhoids were small.                           The exam was otherwise without abnormality. Complications:            No immediate complications. Estimated Blood Loss:     Estimated blood loss was minimal. Impression:               - One 1 mm polyp in the sigmoid colon, removed with                            a cold biopsy forceps. Resected and retrieved.                           - One 5 mm polyp in the sigmoid colon, removed with                            a cold snare. Resected and retrieved.                           -  Diverticulosis in the sigmoid colon and in the                            descending colon.                           - Non-bleeding internal hemorrhoids.                           - The examination was otherwise normal. Recommendation:           - Patient has a contact number available for                            emergencies. The signs and symptoms of potential                            delayed complications were discussed with the  patient. Return to normal activities tomorrow.                            Written discharge instructions were provided to the                            patient.                           - Resume previous diet.                           - Continue present medications.                           - Await pathology results.                           - Repeat colonoscopy in 5-10 years for surveillance                            based on pathology results. Mauri Pole, MD 03/11/2019 10:59:03 AM This report has been signed electronically.

## 2019-03-13 ENCOUNTER — Telehealth: Payer: Self-pay

## 2019-03-13 NOTE — Telephone Encounter (Signed)
  Follow up Call-  Call back number 03/11/2019  Post procedure Call Back phone  # [phone]  Permission to leave phone message Yes  Some recent data might be hidden     Patient questions:  Do you have a fever, pain , or abdominal swelling? No. Pain Score  0 *  Have you tolerated food without any problems? Yes.    Have you been able to return to your normal activities? Yes.    Do you have any questions about your discharge instructions: Diet   No. Medications  No. Follow up visit  No.  Do you have questions or concerns about your Care? No.  Actions: * If pain score is 4 or above: No action needed, pain <4.  1. Have you developed a fever since your procedure? no  2.   Have you had an respiratory symptoms (SOB or cough) since your procedure? no  3.   Have you tested positive for COVID 19 since your procedure no  4.   Have you had any family members/close contacts diagnosed with the COVID 19 since your procedure?  no   If yes to any of these questions please route to Joylene John, RN and Alphonsa Gin, Therapist, sports.

## 2019-03-14 ENCOUNTER — Encounter: Payer: Self-pay | Admitting: Gastroenterology

## 2019-04-02 ENCOUNTER — Telehealth: Payer: Self-pay

## 2019-04-02 NOTE — Telephone Encounter (Signed)
Called and reminded pt about copay. Pt stated understanding

## 2019-04-03 ENCOUNTER — Ambulatory Visit (INDEPENDENT_AMBULATORY_CARE_PROVIDER_SITE_OTHER): Payer: Medicare HMO | Admitting: Orthopedic Surgery

## 2019-04-03 ENCOUNTER — Encounter: Payer: Self-pay | Admitting: Orthopedic Surgery

## 2019-04-03 ENCOUNTER — Ambulatory Visit (INDEPENDENT_AMBULATORY_CARE_PROVIDER_SITE_OTHER): Payer: Medicare HMO

## 2019-04-03 ENCOUNTER — Other Ambulatory Visit: Payer: Self-pay

## 2019-04-03 DIAGNOSIS — M170 Bilateral primary osteoarthritis of knee: Secondary | ICD-10-CM

## 2019-04-03 DIAGNOSIS — M25562 Pain in left knee: Secondary | ICD-10-CM

## 2019-04-04 ENCOUNTER — Encounter: Payer: Self-pay | Admitting: Orthopedic Surgery

## 2019-04-04 ENCOUNTER — Telehealth: Payer: Self-pay | Admitting: Orthopedic Surgery

## 2019-04-04 DIAGNOSIS — M170 Bilateral primary osteoarthritis of knee: Secondary | ICD-10-CM | POA: Diagnosis not present

## 2019-04-04 MED ORDER — BUPIVACAINE HCL 0.25 % IJ SOLN
4.0000 mL | INTRAMUSCULAR | Status: AC | PRN
  Administered 2019-04-04: 4 mL via INTRA_ARTICULAR

## 2019-04-04 MED ORDER — LIDOCAINE HCL 1 % IJ SOLN
5.0000 mL | INTRAMUSCULAR | Status: AC | PRN
  Administered 2019-04-04: 5 mL

## 2019-04-04 MED ORDER — METHYLPREDNISOLONE ACETATE 40 MG/ML IJ SUSP
40.0000 mg | INTRAMUSCULAR | Status: AC | PRN
  Administered 2019-04-04: 40 mg via INTRA_ARTICULAR

## 2019-04-04 NOTE — Telephone Encounter (Signed)
Patient called.  She would like to be prescribed a pain medicine.   Call back number: [phone]

## 2019-04-04 NOTE — Progress Notes (Signed)
Office Visit Note   Patient: [name]           Date of Birth: Jun 27, 1950           MRN: [id number] Visit Date: 04/03/2019 Requested by: Nolene Ebbs, MD [address] [place],  [place] [address] PCP: Nolene Ebbs, MD  Subjective: Chief Complaint  Patient presents with  . Right Knee - Pain  . Left Knee - Pain    HPI: [name] is a 69 y.o. female who presents to the office complaining of bilateral knee pain.  She is here today for bilateral knee Monovisc injections.  She notes that her left knee is bothering her worse than her right knee.  Her left knee has been hurting worse in the last week.  She had a fall in late January but was "walking better after the fall".  She notes difficulty and pain with extending her left knee..                ROS:  All systems reviewed are negative as they relate to the chief complaint within the history of present illness.  Patient denies fevers or chills.  Assessment & Plan: Visit Diagnoses:  1. Bilateral primary osteoarthritis of knee   2. Acute pain of left knee     Plan: Patient is a 69 year old female who presents with history of bilateral knee osteoarthritis.  She is here today for bilateral knee Monovisc injections.  On examination she has significantly increased pain in the left knee compared with the right knee, especially with active and passive extension of the left knee.  With increased pain in the left knee as well as her history of fall, new radiographs of the left knee were taken today.  No loose bodies were found on these radiographs with no significant change compared with prior radiographs from January of this year.  Significant arthritis is present but unchanged no fracture or dislocation were noted.  Copies of the radiographs were given to the patient and the bilateral knee Monovisc injections were administered.  Patient tolerated the procedure well and will follow up with the office as needed.  If the left knee continues  to be relatively unrelenting and pain generation she may need to consider total knee replacement but that would be her decision.  Follow-Up Instructions: No follow-ups on file.   Orders:  Orders Placed This Encounter  Procedures  . XR Knee 1-2 Views Left   No orders of the defined types were placed in this encounter.     Procedures: Large Joint Inj: L knee on 04/04/2019 10:15 PM Indications: diagnostic evaluation, joint swelling and pain Details: 18 G 1.5 in needle, superolateral approach  Arthrogram: No  Medications: 5 mL lidocaine 1 %; 40 mg methylPREDNISolone acetate 40 MG/ML; 4 mL bupivacaine 0.25 % Outcome: tolerated well, no immediate complications Procedure, treatment alternatives, risks and benefits explained, specific risks discussed. Consent was given by the patient. Immediately prior to procedure a time out was called to verify the correct patient, procedure, equipment, support staff and site/side marked as required. Patient was prepped and draped in the usual sterile fashion.       Clinical Data: No additional findings.  Objective: Vital Signs: There were no vitals taken for this visit.  Physical Exam:  Constitutional: Patient appears well-developed HEENT:  Head: Normocephalic Eyes:EOM are normal Neck: Normal range of motion Cardiovascular: Normal rate Pulmonary/chest: Effort normal Neurologic: Patient is alert Skin: Skin is warm Psychiatric: Patient has normal  mood and affect  Ortho Exam:  Bilateral knee Exam Trace effusion of left knee.  No effusion of right knee. Tender to palpation diffusely throughout the left knee. Extensor mechanism intact No TTP over the medial or lateral jointlines, quad tendon, patellar tendon, pes anserinus, patella, tibial tubercle, LCL/MCL insertions of the right knee. Stable to varus/valgus stresses.  Stable to anterior/posterior drawer Extension to 0 degrees in the right knee.  Extension to about 20 in the left knee with  pain at terminal extension. Flexion > 90 degrees  Specialty Comments:  No specialty comments available.  Imaging: No results found.   PMFS History: Patient Active Problem List   Diagnosis Date Noted  . Asthma 06/12/2017  . Hyperlipidemia 06/12/2017  . Seizures ([place]) 06/12/2017  . Seizure ([place]) 06/12/2017  . GERD (gastroesophageal reflux disease) 10/31/2016  . Chronic rhinitis 10/31/2016  . Chronic cholecystitis 04/12/2015  . Preoperative clearance 03/17/2015  . Spastic hemiplegia affecting nondominant side ([place]) 10/27/2013  . Adhesive capsulitis of right shoulder 10/27/2013  . Chronic diastolic heart failure ([place]) 08/26/2013  . Dyslipidemia 08/26/2013  . Borderline diabetes 08/26/2013  . Cerebral infarction ([place]) 08/26/2013  . Acute CVA (cerebrovascular accident) ([place]) 08/23/2013  . Pre-syncope 08/17/2012  . Hypokalemia 08/17/2012  . ARTHRITIS 03/28/2010  . DE QUERVAIN'S TENOSYNOVITIS 03/28/2010  . Normocytic anemia 07/26/2009  . Moderate persistent asthma 03/09/2009  . DENTAL CARIES 03/09/2009  . CANDIDIASIS OF VULVA AND VAGINA 06/04/2008  . SKIN TAG 06/04/2008  . ARTHRITIS, KNEES, BILATERAL 05/22/2008  . HYPERTENSION, BENIGN ESSENTIAL 05/04/2008  . KNEE PAIN, BILATERAL 05/04/2008  . Cough, persistent 05/04/2008  . MENISCUS TEAR, RIGHT 12/07/2005   Past Medical History:  Diagnosis Date  . Arthritis   . Asthma   . Difficulty sleeping   . Gallstones   . Hyperlipidemia   . Hypertension   . Preoperative clearance   . Spasmodic cough 06/2017   "IT COMES IN DIFFERENT SEASONS"  . Stroke ([place]) 2015   WEAKNESS L SIDE OF BODY     Family History  Problem Relation Age of Onset  . Diabetes Mother   . Colon cancer Father 43  . Colon polyps Neg Hx   . Esophageal cancer Neg Hx   . Rectal cancer Neg Hx   . Stomach cancer Neg Hx     Past Surgical History:  Procedure Laterality Date  . CHOLECYSTECTOMY N/A 04/12/2015   Procedure: LAPAROSCOPIC CHOLECYSTECTOMY WITH  INTRAOPERATIVE CHOLANGIOGRAM;  Surgeon: Greer Pickerel, MD;  Location: [facility];  Service: General;  Laterality: N/A;  . gall stones    . KNEE ARTHROSCOPY  2010   Social History   Occupational History  . Not on file  Tobacco Use  . Smoking status: Former Smoker    Quit date: 04/08/1994    Years since quitting: 25.0  . Smokeless tobacco: Never Used  Substance and Sexual Activity  . Alcohol use: No  . Drug use: No  . Sexual activity: Not on file

## 2019-04-07 ENCOUNTER — Other Ambulatory Visit: Payer: Self-pay | Admitting: Surgical

## 2019-04-07 NOTE — Telephone Encounter (Signed)
Pls advise. Thanks.  

## 2019-04-07 NOTE — Telephone Encounter (Signed)
Please submit to pharmacy on file.

## 2019-04-07 NOTE — Telephone Encounter (Signed)
Tried to submit but e-prescribe failed multiple times. Will try again tomorrow.  Please reply back to me for reminder

## 2019-04-08 ENCOUNTER — Other Ambulatory Visit: Payer: Self-pay | Admitting: Surgical

## 2019-04-08 MED ORDER — TRAMADOL HCL 50 MG PO TABS: 50.0000 mg | ORAL_TABLET | Freq: Every day | ORAL | 0 refills | Status: DC | PRN

## 2019-04-08 NOTE — Telephone Encounter (Signed)
Please try to resubmit. Thanks.

## 2019-04-24 ENCOUNTER — Telehealth: Payer: Self-pay

## 2019-04-24 ENCOUNTER — Other Ambulatory Visit: Payer: Self-pay | Admitting: Surgical

## 2019-04-24 MED ORDER — TRAMADOL HCL 50 MG PO TABS: 50.0000 mg | ORAL_TABLET | Freq: Every day | ORAL | 0 refills | Status: DC | PRN

## 2019-04-24 NOTE — Telephone Encounter (Signed)
I s/w patient and advised  

## 2019-04-24 NOTE — Telephone Encounter (Signed)
Pls advise. Thanks.  

## 2019-04-24 NOTE — Telephone Encounter (Signed)
Patient called Triage phone would like a RF on Tramadol. Uses [facility] on [address]

## 2019-05-26 ENCOUNTER — Other Ambulatory Visit: Payer: Medicare HMO

## 2019-07-02 ENCOUNTER — Other Ambulatory Visit: Payer: Self-pay

## 2019-07-02 ENCOUNTER — Ambulatory Visit: Payer: Medicare HMO | Admitting: Orthopedic Surgery

## 2019-07-02 ENCOUNTER — Encounter: Payer: Self-pay | Admitting: Orthopedic Surgery

## 2019-07-02 VITALS — Ht 63.0 in | Wt 220.0 lb

## 2019-07-02 DIAGNOSIS — M170 Bilateral primary osteoarthritis of knee: Secondary | ICD-10-CM | POA: Diagnosis not present

## 2019-07-03 ENCOUNTER — Ambulatory Visit: Payer: Medicare HMO | Admitting: Neurology

## 2019-07-04 ENCOUNTER — Other Ambulatory Visit: Payer: Self-pay

## 2019-07-07 NOTE — Progress Notes (Signed)
Office Visit Note   Patient: [name]           Date of Birth: 04/21/1950           MRN: [id number] Visit Date: 07/02/2019 Requested by: Nolene Ebbs, MD [address] [place],  [place] [address] PCP: Nolene Ebbs, MD  Subjective: Chief Complaint  Patient presents with  . Left Knee - Pain, Follow-up  . Right Knee - Pain, Follow-up    HPI: [name] is a 69 y.o. female who presents to the office complaining of bilateral knee pain.  She returns following last bilateral knee injections on 04/03/2019.  She notes the injection helped her right knee but not her left knee.  Her main complaint is left knee pain today.  Her left knee has bothered her more than her right historically.  Pain is waking her up at night and she has about 20 foot walking endurance.  She also has occasional rest pain.  She denies any groin pain but she does note low back pain with radicular symptoms down her left leg.  This is happened about 3 times in the last month.  She denies any history of diabetes, smoking, blood clot history, cardiac history.  She requests total knee arthroplasty.  She has 4 steps to get into her house but she does have a steep driveway.  Her daughter lives up the street from her.  Her grandson is in and out of her home as well.  She denies any personal or family history of DVT or pulmonary embolism.               ROS:  All systems reviewed are negative as they relate to the chief complaint within the history of present illness.  Patient denies fevers or chills.  Assessment & Plan: Visit Diagnoses:  1. Primary osteoarthritis of knees, bilateral     Plan: Patient is a 69 year old female who presents complaining of bilateral knee pain, primarily left knee pain.  Her last injection on 04/03/2019 into the left knee provided no relief at all.  She wakes with pain and has 20 foot walking endurance due to left knee pain.  She has a 25 to 30 degree flexion contracture of the left knee.  She  requests total knee arthroplasty.  The risks and benefits as well as the extensive rehab process were discussed with the patient today.  After long discussion, patient wishes to proceed with surgery.  Patient will be posted for total knee arthroplasty.  Follow-up after procedure.  Follow-Up Instructions: No follow-ups on file.   Orders:  No orders of the defined types were placed in this encounter.  No orders of the defined types were placed in this encounter.     Procedures: No procedures performed   Clinical Data: No additional findings.  Objective: Vital Signs: Ht 5\' 3"  (1.6 m)   Wt 220 lb (99.8 kg)   BMI 38.97 kg/m   Physical Exam:  Constitutional: Patient appears well-developed HEENT:  Head: Normocephalic Eyes:EOM are normal Neck: Normal range of motion Cardiovascular: Normal rate Pulmonary/chest: Effort normal Neurologic: Patient is alert Skin: Skin is warm Psychiatric: Patient has normal mood and affect  Ortho Exam:  Left  knee Exam  trace effusion Tender to palpation over the medial and lateral joint lines.  30 degree flexion contracture.  Knee flexes to 100 degrees Extensor mechanism intact No TTP over the quad tendon, patellar tendon, pes anserinus, patella, tibial tubercle, LCL/MCL insertions Stable to varus/valgus  stresses.  Stable to anterior/posterior drawer No pain with hip range of motion.  Negative straight leg raise.  Specialty Comments:  No specialty comments available.  Imaging: No results found.   PMFS History: Patient Active Problem List   Diagnosis Date Noted  . Asthma 06/12/2017  . Hyperlipidemia 06/12/2017  . Seizures ([place]) 06/12/2017  . Seizure ([place]) 06/12/2017  . GERD (gastroesophageal reflux disease) 10/31/2016  . Chronic rhinitis 10/31/2016  . Chronic cholecystitis 04/12/2015  . Preoperative clearance 03/17/2015  . Spastic hemiplegia affecting nondominant side ([place]) 10/27/2013  . Adhesive capsulitis of right shoulder  10/27/2013  . Chronic diastolic heart failure ([place]) 08/26/2013  . Dyslipidemia 08/26/2013  . Borderline diabetes 08/26/2013  . Cerebral infarction ([place]) 08/26/2013  . Acute CVA (cerebrovascular accident) ([place]) 08/23/2013  . Pre-syncope 08/17/2012  . Hypokalemia 08/17/2012  . ARTHRITIS 03/28/2010  . DE QUERVAIN'S TENOSYNOVITIS 03/28/2010  . Normocytic anemia 07/26/2009  . Moderate persistent asthma 03/09/2009  . DENTAL CARIES 03/09/2009  . CANDIDIASIS OF VULVA AND VAGINA 06/04/2008  . SKIN TAG 06/04/2008  . ARTHRITIS, KNEES, BILATERAL 05/22/2008  . HYPERTENSION, BENIGN ESSENTIAL 05/04/2008  . KNEE PAIN, BILATERAL 05/04/2008  . Cough, persistent 05/04/2008  . MENISCUS TEAR, RIGHT 12/07/2005   Past Medical History:  Diagnosis Date  . Arthritis   . Asthma   . Difficulty sleeping   . Gallstones   . Hyperlipidemia   . Hypertension   . Preoperative clearance   . Spasmodic cough 06/2017   "IT COMES IN DIFFERENT SEASONS"  . Stroke ([place]) 2015   WEAKNESS L SIDE OF BODY     Family History  Problem Relation Age of Onset  . Diabetes Mother   . Colon cancer Father 22  . Colon polyps Neg Hx   . Esophageal cancer Neg Hx   . Rectal cancer Neg Hx   . Stomach cancer Neg Hx     Past Surgical History:  Procedure Laterality Date  . CHOLECYSTECTOMY N/A 04/12/2015   Procedure: LAPAROSCOPIC CHOLECYSTECTOMY WITH INTRAOPERATIVE CHOLANGIOGRAM;  Surgeon: Greer Pickerel, MD;  Location: [facility];  Service: General;  Laterality: N/A;  . gall stones    . KNEE ARTHROSCOPY  2010   Social History   Occupational History  . Not on file  Tobacco Use  . Smoking status: Former Smoker    Quit date: 04/08/1994    Years since quitting: 25.2  . Smokeless tobacco: Never Used  Substance and Sexual Activity  . Alcohol use: No  . Drug use: No  . Sexual activity: Not on file

## 2019-07-14 ENCOUNTER — Other Ambulatory Visit (HOSPITAL_COMMUNITY)
Admission: RE | Admit: 2019-07-14 | Discharge: 2019-07-14 | Disposition: A | Discharge status: Home / Self Care | Payer: Medicare HMO | Source: Ambulatory Visit | Attending: Orthopedic Surgery | Admitting: Orthopedic Surgery

## 2019-07-14 DIAGNOSIS — Z01812 Encounter for preprocedural laboratory examination: Secondary | ICD-10-CM | POA: Diagnosis present

## 2019-07-14 DIAGNOSIS — Z20822 Contact with and (suspected) exposure to covid-19: Secondary | ICD-10-CM | POA: Insufficient documentation

## 2019-07-14 LAB — SARS CORONAVIRUS 2 (TAT 6-24 HRS): SARS Coronavirus 2: NEGATIVE

## 2019-07-15 NOTE — Pre-Procedure Instructions (Signed)
[id number] - [place], [place] [address] Phone: [phone] Fax: [phone]    Your procedure is scheduled on Thurs., July 17, 2019 from 12:29PM-3:21PM  Report to [facility] Entrance "A" at 10:30AM   Call this number if you have problems the morning of surgery:  [phone]   Remember:  Do not eat after midnight on June 9th  You may drink clear liquids until 3 hours (9:30AM) prior to surgery.  Clear liquids allowed are: Water, Juice (non-citric and without pulp - diabetics please choose diet or no sugar options), Carbonated beverages - (diabetics please choose diet or no sugar options), Clear Tea, Black Coffee only (no creamer, milk or cream including half and half), Plain Jell-O only (diabetics please choose diet or no sugar options), Gatorade (diabetics please choose diet or no sugar options) and Plain Popsicles only    Enhanced Recovery after Surgery for Orthopedics Enhanced Recovery after Surgery is a protocol used to improve the stress on your body and your recovery after surgery.  Patient Instructions .  [place] Kitchen The day of surgery (if you do NOT have diabetes): Drink By: (9:30AM) o Drink ONE (1) Pre-Surgery Clear Ensure as directed.   o This drink was given to you during your hospital  pre-op appointment visit. o Finish the drink at the designated time by the pre-op nurse.  o Nothing else to drink after completing the  Pre-Surgery Clear Ensure.         If you have questions, please contact your surgeon's office.    Take these medicines the morning of surgery with A SIP OF WATER: AmLODipine (NORVASC)        If Needed: Albuterol Nebulizer  Follow your surgeon's instructions on when to stop Aspirin.  If no instructions were given by your surgeon then you will need to call the office to get those instructions.     As of today, STOP taking all Aspirin (unless  instructed by your doctor) and Other Aspirin containing products, Vitamins, Fish oils, and Herbal medications. Also stop all NSAIDS i.e. Advil, Ibuprofen, Motrin, Aleve, Anaprox, Naproxen, BC, Goody Powders, and all Supplements.  No Smoking of any kind, Tobacco, or Alcohol products 24 hours prior to your procedure. If you use a Cpap at night, you may bring all equipment for your overnight stay.    Special instructions:  [place]- Preparing For Surgery  Before surgery, you can play an important role. Because skin is not sterile, your skin needs to be as free of germs as possible. You can reduce the number of germs on your skin by washing with CHG (chlorahexidine gluconate) Soap before surgery.  CHG is an antiseptic cleaner which kills germs and bonds with the skin to continue killing germs even after washing.    Please do not use if you have an allergy to CHG or antibacterial soaps. If your skin becomes reddened/irritated stop using the CHG.  Do not shave (including legs and underarms) for at least 48 hours prior to first CHG shower. It is OK to shave your face.  Please follow these instructions carefully.   1. Shower the NIGHT BEFORE SURGERY and the MORNING OF SURGERY with CHG.   2. If you chose to wash your hair, wash your hair first as usual with your normal shampoo.  3. After you shampoo, rinse your hair and body thoroughly to remove the shampoo.  4.  Use CHG as you would any other liquid soap. You can apply CHG directly to the skin and wash gently with a scrungie or a clean washcloth.   5. Apply the CHG Soap to your body ONLY FROM THE NECK DOWN.  Do not use on open wounds or open sores. Avoid contact with your eyes, ears, mouth and genitals (private parts). Wash Face and genitals (private parts)  with your normal soap.  6. Wash thoroughly, paying special attention to the area where your surgery will be performed.  7. Thoroughly rinse your body with warm water from the neck  down.  8. DO NOT shower/wash with your normal soap after using and rinsing off the CHG Soap.  9. Pat yourself dry with a CLEAN TOWEL.  10. Wear CLEAN PAJAMAS to bed the night before surgery, wear comfortable clothes the morning of surgery  11. Place CLEAN SHEETS on your bed the night of your first shower and DO NOT SLEEP WITH PETS.  Day of Surgery:             Remember to brush your teeth WITH YOUR REGULAR TOOTHPASTE.  Do not wear jewelry, make-up or nail polish.  Do not wear lotions, powders, or perfumes, or deodorant.  Do not shave 48 hours prior to surgery.    Do not bring valuables to the hospital.  [facility] is not responsible for any belongings or valuables.  Contacts, dentures or bridgework may not be worn into surgery.    For patients admitted to the hospital, discharge time will be determined by your treatment team.  Patients discharged the day of surgery will not be allowed to drive home, and someone age 70 and over needs to stay with them for 24 hours.  Please wear clean clothes to the hospital/surgery center.     Please read over the following fact sheets that you were given.

## 2019-07-16 ENCOUNTER — Other Ambulatory Visit: Payer: Self-pay

## 2019-07-16 ENCOUNTER — Encounter (HOSPITAL_COMMUNITY): Payer: Self-pay

## 2019-07-16 ENCOUNTER — Encounter (HOSPITAL_COMMUNITY)
Admission: RE | Admit: 2019-07-16 | Discharge: 2019-07-16 | Disposition: A | Discharge status: Home / Self Care | Payer: Medicare HMO | Source: Ambulatory Visit | Attending: Orthopedic Surgery | Admitting: Orthopedic Surgery

## 2019-07-16 DIAGNOSIS — Z87442 Personal history of urinary calculi: Secondary | ICD-10-CM | POA: Insufficient documentation

## 2019-07-16 DIAGNOSIS — J45909 Unspecified asthma, uncomplicated: Secondary | ICD-10-CM | POA: Diagnosis not present

## 2019-07-16 DIAGNOSIS — Z7982 Long term (current) use of aspirin: Secondary | ICD-10-CM | POA: Diagnosis not present

## 2019-07-16 DIAGNOSIS — Z01818 Encounter for other preprocedural examination: Secondary | ICD-10-CM | POA: Diagnosis present

## 2019-07-16 DIAGNOSIS — Z79899 Other long term (current) drug therapy: Secondary | ICD-10-CM | POA: Diagnosis not present

## 2019-07-16 DIAGNOSIS — E785 Hyperlipidemia, unspecified: Secondary | ICD-10-CM | POA: Diagnosis not present

## 2019-07-16 DIAGNOSIS — I440 Atrioventricular block, first degree: Secondary | ICD-10-CM | POA: Diagnosis not present

## 2019-07-16 DIAGNOSIS — R9431 Abnormal electrocardiogram [ECG] [EKG]: Secondary | ICD-10-CM | POA: Diagnosis not present

## 2019-07-16 DIAGNOSIS — I10 Essential (primary) hypertension: Secondary | ICD-10-CM | POA: Diagnosis not present

## 2019-07-16 DIAGNOSIS — Z8673 Personal history of transient ischemic attack (TIA), and cerebral infarction without residual deficits: Secondary | ICD-10-CM | POA: Diagnosis not present

## 2019-07-16 DIAGNOSIS — Z87891 Personal history of nicotine dependence: Secondary | ICD-10-CM | POA: Diagnosis not present

## 2019-07-16 DIAGNOSIS — M1712 Unilateral primary osteoarthritis, left knee: Secondary | ICD-10-CM | POA: Diagnosis not present

## 2019-07-16 HISTORY — DX: Personal history of urinary calculi: Z87.442

## 2019-07-16 LAB — CBC
HCT: 36.9 % (ref 36.0–46.0)
Hemoglobin: 11.7 g/dL — ABNORMAL LOW (ref 12.0–15.0)
MCH: 28.2 pg (ref 26.0–34.0)
MCHC: 31.7 g/dL (ref 30.0–36.0)
MCV: 88.9 fL (ref 80.0–100.0)
Platelets: 253 10*3/uL (ref 150–400)
RBC: 4.15 MIL/uL (ref 3.87–5.11)
RDW: 13.8 % (ref 11.5–15.5)
WBC: 3.8 10*3/uL — ABNORMAL LOW (ref 4.0–10.5)
nRBC: 0 % (ref 0.0–0.2)

## 2019-07-16 LAB — BASIC METABOLIC PANEL
Anion gap: 9 (ref 5–15)
BUN: 10 mg/dL (ref 8–23)
CO2: 24 mmol/L (ref 22–32)
Calcium: 9 mg/dL (ref 8.9–10.3)
Chloride: 107 mmol/L (ref 98–111)
Creatinine, Ser: 0.57 mg/dL (ref 0.44–1.00)
GFR calc Af Amer: >60 mL/min (ref >60)
GFR calc non Af Amer: >60 mL/min (ref >60)
Glucose, Bld: 101 mg/dL — ABNORMAL HIGH (ref 70–99)
Potassium: 3.2 mmol/L — ABNORMAL LOW (ref 3.5–5.1)
Sodium: 140 mmol/L (ref 135–145)

## 2019-07-16 LAB — SURGICAL PCR SCREEN
MRSA, PCR: NEGATIVE
Staphylococcus aureus: NEGATIVE

## 2019-07-16 NOTE — Anesthesia Preprocedure Evaluation (Addendum)
Anesthesia Evaluation  Patient identified by MRN, date of birth, ID band Patient awake    Reviewed: Allergy & Precautions, NPO status , Patient's Chart, lab work & pertinent test results  Airway Mallampati: II  TM Distance: >3 FB Neck ROM: Full    Dental  (+) Dental Advisory Given, Edentulous Upper, Edentulous Lower   Pulmonary asthma , former smoker,    Pulmonary exam normal breath sounds clear to auscultation       Cardiovascular hypertension, Pt. on medications Normal cardiovascular exam Rhythm:Regular Rate:Normal     Neuro/Psych Seizures -,  CVA (L sided weakness), Residual Symptoms    GI/Hepatic Neg liver ROS, GERD  ,  Endo/Other  negative endocrine ROSObesity   Renal/GU negative Renal ROS     Musculoskeletal  (+) Arthritis  ( left knee osteoarthritis), Osteoarthritis,    Abdominal   Peds  Hematology  (+) Blood dyscrasia, anemia , Plt 253k   Anesthesia Other Findings Day of surgery medications reviewed with the patient.  Reproductive/Obstetrics                           Anesthesia Physical Anesthesia Plan  ASA: III  Anesthesia Plan: Spinal   Post-op Pain Management:  Regional for Post-op pain   Induction:   PONV Risk Score and Plan: 2 and Propofol infusion, Treatment may vary due to age or medical condition and Midazolam  Airway Management Planned: Natural Airway and Nasal Cannula  Additional Equipment:   Intra-op Plan:   Post-operative Plan:   Informed Consent: I have reviewed the patients History and Physical, chart, labs and discussed the procedure including the risks, benefits and alternatives for the proposed anesthesia with the patient or authorized representative who has indicated his/her understanding and acceptance.     Dental advisory given  Plan Discussed with: CRNA, Anesthesiologist and Surgeon  Anesthesia Plan Comments: (PAT note written 07/16/2019 by  Myra Gianotti, PA-C. )       Anesthesia Quick Evaluation

## 2019-07-16 NOTE — Progress Notes (Signed)
Anesthesia Chart Review:  Case: [id number] Date/Time: 07/17/19 1214   Procedure: LEFT TOTAL KNEE ARTHROPLASTY (Left Knee)   Anesthesia type: Spinal   Pre-op diagnosis: left knee osteoarthritis   Location: [facility] OR ROOM 06 / [facility]   Surgeons: Meredith Pel, MD      DISCUSSION: Patient is a 69 year old female scheduled for the above procedure.  History includes former smoker, HTN, HLD, CVA (2015, left sided weakness), asthma, difficulty sleeping, cholecystectomy (2017).  BMI is consistent with obesity.  OSA screening score is 5.  Preoperative COVID-19 test negative on 07/14/2019.  Anesthesia team to evaluate on the day of surgery.   VS: BP (!) 151/95   Pulse 80   Temp 37 C (Oral)   Resp 18   Ht 5\' 3"  (1.6 m)   Wt 100.5 kg   SpO2 100%   BMI 39.24 kg/m    PROVIDERS: Nolene Ebbs, MD his PCP Ellouise Newer, MD is neurologist - She is not followed by cardiology routinely, but had a preoperative evaluation with Quay Burow, MD in 2017 prior to undergoing cholecystectomy and had a low risk stress test.   LABS: Labs reviewed: Acceptable for surgery. (all labs ordered are listed, but only abnormal results are displayed)  Labs Reviewed  BASIC METABOLIC PANEL - Abnormal; Notable for the following components:      Result Value   Potassium 3.2 (*)    Glucose, Bld 101 (*)    All other components within normal limits  CBC - Abnormal; Notable for the following components:   WBC 3.8 (*)    Hemoglobin 11.7 (*)    All other components within normal limits  SURGICAL PCR SCREEN    Spirometry 10/31/16 Verlin Fester, Ralph, MD):  FVC was 1.16 L and FEV1 was 0.93 L (48% predicted) with 16% postbronchodilator improvement.  This study was performed while the patient was asymptomatic.    EKG: 07/16/19: Sinus rhythm with 1st degree A-V block Minimal voltage criteria for LVH, may be normal variant ( R in aVL ) Nonspecific T wave abnormality Abnormal ECG - PR interval has lengthened slightly.  Non-specific T wave abnormality appears more prominent is some leads when compared to prior tracing.    CV: Nuclear stress test 03/24/15:  Nuclear stress EF: 65%.  The left ventricular ejection fraction is normal (55-65%).  There was no ST segment deviation noted during stress.  T wave inversion was noted during stress in the II, III, V4, V5 and V6 leads.  The study is normal.  This is a low risk study. Normal pharmacologic nuclear study with no evidence for infarct or ischemia.   Echo 08/24/13: Study Conclusions  - Left ventricle: The cavity size was normal. Wall thickness was  increased increased in a pattern of mild to moderate LVH.  Systolic function was normal. The estimated ejection fraction was  in the range of 60% to 65%. Wall motion was normal; there were no  regional wall motion abnormalities. Doppler parameters are  consistent with abnormal left ventricular relaxation (grade 1  diastolic dysfunction).  - Aortic valve: Valve area (VTI): 4.14 cm^2. Valve area (Vmax):  3.38 cm^2.  - Atrial septum: No defect or patent foramen ovale was identified.  - Technically difficult study.     Carotid US 08/24/13: Summary:  Findings suggest 1-39% internal carotid artery stenosis  bilaterally. Vertebral arteries are patent with antegrade flow.   Past Medical History:  Diagnosis Date  . Arthritis   . Asthma   . Difficulty sleeping   .  Gallstones   . History of kidney stones   . Hyperlipidemia   . Hypertension   . Preoperative clearance   . Spasmodic cough 06/2017   "IT COMES IN DIFFERENT SEASONS"  . Stroke ([place]) 2015   WEAKNESS L SIDE OF BODY     Past Surgical History:  Procedure Laterality Date  . CHOLECYSTECTOMY N/A 04/12/2015   Procedure: LAPAROSCOPIC CHOLECYSTECTOMY WITH INTRAOPERATIVE CHOLANGIOGRAM;  Surgeon: Greer Pickerel, MD;  Location: [facility];  Service: General;  Laterality: N/A;  . gall stones    . KNEE ARTHROSCOPY  2010  . TUBAL LIGATION       MEDICATIONS: . albuterol (ACCUNEB) 1.25 MG/3ML nebulizer solution  . amLODipine (NORVASC) 5 MG tablet  . aspirin EC 81 MG tablet  . atorvastatin (LIPITOR) 40 MG tablet  . ibuprofen (ADVIL) 600 MG tablet  . meclizine (ANTIVERT) 25 MG tablet  . Nebulizer MISC  . Respiratory Therapy Supplies (FLUTTER) DEVI  . Vitamin D, Ergocalciferol, (DRISDOL) 1.25 MG (50000 UNIT) CAPS capsule   No current facility-administered medications for this encounter.  Last ASA 07/16/19.   Myra Gianotti, PA-C Surgical Short Stay/Anesthesiology [facility] Phone [phone] [facility] Phone [phone] 07/16/2019 1:44 PM

## 2019-07-16 NOTE — Progress Notes (Addendum)
PCP - Dr. Revonda Humphrey  Cardiologist - Denies  Chest x-ray - Denies  EKG - 07/16/19  Stress Test - 03/24/15 (E)  ECHO - Denies  Cardiac Cath - Denies  AICD-na PM-na LOOP-na  Sleep Study - Denies- + Stop Bang- sent to PCP CPAP - Denies  LABS- 07/14/19: COVID- Neg 07/16/19: CBC, BMP, PCR  ASA-LD- 6/9  ERAS- Yes- 1 ensure given  HA1C- Denies  Anesthesia- Yes- EKG  Pt denies having chest pain, sob, or fever at this time. All instructions explained to the pt, with a verbal understanding of the material. Pt agrees to go over the instructions while at home for a better understanding. Pt also instructed to self quarantine after being tested for COVID-19. The opportunity to ask questions was provided.   Coronavirus Screening  Have you experienced the following symptoms:  Cough yes/no: No Fever (>100.69F)  yes/no: No Runny nose yes/no: No Sore throat yes/no: No Difficulty breathing/shortness of breath  yes/no: No  Have you or a family member traveled in the last 14 days and where? yes/no: No   If the patient indicates "YES" to the above questions, their PAT will be rescheduled to limit the exposure to others and, the surgeon will be notified. THE PATIENT WILL NEED TO BE ASYMPTOMATIC FOR 14 DAYS.   If the patient is not experiencing any of these symptoms, the PAT nurse will instruct them to NOT bring anyone with them to their appointment since they may have these symptoms or traveled as well.   Please remind your patients and families that hospital visitation restrictions are in effect and the importance of the restrictions.

## 2019-07-16 NOTE — Progress Notes (Signed)
   07/16/19 1107  OBSTRUCTIVE SLEEP APNEA  Have you ever been diagnosed with sleep apnea through a sleep study? No  Do you snore loudly (loud enough to be heard through closed doors)?  1  Do you often feel tired, fatigued, or sleepy during the daytime (such as falling asleep during driving or talking to someone)? 0  Has anyone observed you stop breathing during your sleep? 1  Do you have, or are you being treated for high blood pressure? 1  BMI more than 35 kg/m2? 1  Age > 50 (1-yes) 1  Neck circumference greater than:Female 16 inches or larger, Female 17inches or larger? 0 (11)  Female Gender (Yes=1) 0  Obstructive Sleep Apnea Score 5  Score 5 or greater  Results sent to PCP

## 2019-07-17 ENCOUNTER — Observation Stay (HOSPITAL_COMMUNITY)
Admission: RE | Admit: 2019-07-17 | Discharge: 2019-07-22 | Disposition: A | Discharge status: Skilled Nursing Facility | Payer: Medicare HMO | Attending: Orthopedic Surgery | Admitting: Orthopedic Surgery

## 2019-07-17 ENCOUNTER — Ambulatory Visit (HOSPITAL_COMMUNITY): Payer: Medicare HMO | Admitting: Anesthesiology

## 2019-07-17 ENCOUNTER — Ambulatory Visit (HOSPITAL_COMMUNITY): Payer: Medicare HMO | Admitting: Vascular Surgery

## 2019-07-17 ENCOUNTER — Encounter (HOSPITAL_COMMUNITY)
Admission: RE | Disposition: A | Discharge status: Skilled Nursing Facility | Payer: Medicare HMO | Source: Home / Self Care | Attending: Orthopedic Surgery

## 2019-07-17 ENCOUNTER — Encounter (HOSPITAL_COMMUNITY): Payer: Self-pay | Admitting: Orthopedic Surgery

## 2019-07-17 DIAGNOSIS — J4540 Moderate persistent asthma, uncomplicated: Secondary | ICD-10-CM | POA: Diagnosis not present

## 2019-07-17 DIAGNOSIS — R7303 Prediabetes: Secondary | ICD-10-CM | POA: Diagnosis not present

## 2019-07-17 DIAGNOSIS — Z20822 Contact with and (suspected) exposure to covid-19: Secondary | ICD-10-CM | POA: Diagnosis not present

## 2019-07-17 DIAGNOSIS — M1712 Unilateral primary osteoarthritis, left knee: Secondary | ICD-10-CM | POA: Diagnosis not present

## 2019-07-17 DIAGNOSIS — I110 Hypertensive heart disease with heart failure: Secondary | ICD-10-CM | POA: Diagnosis not present

## 2019-07-17 DIAGNOSIS — I5032 Chronic diastolic (congestive) heart failure: Secondary | ICD-10-CM | POA: Insufficient documentation

## 2019-07-17 DIAGNOSIS — Z87891 Personal history of nicotine dependence: Secondary | ICD-10-CM | POA: Diagnosis not present

## 2019-07-17 DIAGNOSIS — I69354 Hemiplegia and hemiparesis following cerebral infarction affecting left non-dominant side: Secondary | ICD-10-CM | POA: Diagnosis not present

## 2019-07-17 HISTORY — PX: TOTAL KNEE ARTHROPLASTY: SHX125

## 2019-07-17 SURGERY — ARTHROPLASTY, KNEE, TOTAL
Anesthesia: Monitor Anesthesia Care | Site: Knee | Laterality: Left

## 2019-07-17 MED ORDER — ALBUTEROL SULFATE (2.5 MG/3ML) 0.083% IN NEBU: 3.0000 mL | INHALATION_SOLUTION | Freq: Four times a day (QID) | RESPIRATORY_TRACT | Status: DC | PRN

## 2019-07-17 MED ORDER — CEFAZOLIN SODIUM-DEXTROSE 2-4 GM/100ML-% IV SOLN
2.0000 g | Freq: Four times a day (QID) | INTRAVENOUS | Status: AC
  Administered 2019-07-17 – 2019-07-18 (×2): 2 g via INTRAVENOUS
  Filled 2019-07-17 (×2): qty 100

## 2019-07-17 MED ORDER — ASPIRIN EC 81 MG PO TBEC
81.0000 mg | DELAYED_RELEASE_TABLET | Freq: Every day | ORAL | Status: DC
  Administered 2019-07-18 – 2019-07-22 (×5): 81 mg via ORAL
  Filled 2019-07-17 (×5): qty 1

## 2019-07-17 MED ORDER — TRANEXAMIC ACID-NACL 1000-0.7 MG/100ML-% IV SOLN
INTRAVENOUS | Status: AC
  Filled 2019-07-17: qty 100

## 2019-07-17 MED ORDER — MENTHOL 3 MG MT LOZG: 1.0000 | LOZENGE | OROMUCOSAL | Status: DC | PRN

## 2019-07-17 MED ORDER — ALBUMIN HUMAN 5 % IV SOLN
12.5000 g | Freq: Once | INTRAVENOUS | Status: AC
  Administered 2019-07-17: 12.5 g via INTRAVENOUS

## 2019-07-17 MED ORDER — FENTANYL CITRATE (PF) 250 MCG/5ML IJ SOLN
INTRAMUSCULAR | Status: AC
  Filled 2019-07-17: qty 5

## 2019-07-17 MED ORDER — ONDANSETRON HCL 4 MG/2ML IJ SOLN
INTRAMUSCULAR | Status: DC | PRN
  Administered 2019-07-17: 4 mg via INTRAVENOUS

## 2019-07-17 MED ORDER — PROPOFOL 500 MG/50ML IV EMUL
INTRAVENOUS | Status: DC | PRN
Start: 2019-07-17 — End: 2019-07-17
  Administered 2019-07-17: 40 ug/kg/min via INTRAVENOUS

## 2019-07-17 MED ORDER — PROPOFOL 1000 MG/100ML IV EMUL
INTRAVENOUS | Status: AC
  Filled 2019-07-17: qty 100

## 2019-07-17 MED ORDER — CLONIDINE HCL (ANALGESIA) 100 MCG/ML EP SOLN
EPIDURAL | Status: AC
  Filled 2019-07-17: qty 10

## 2019-07-17 MED ORDER — SODIUM CHLORIDE 0.9% FLUSH
INTRAVENOUS | Status: DC | PRN
  Administered 2019-07-17: 20 mL via TOPICAL

## 2019-07-17 MED ORDER — ORAL CARE MOUTH RINSE: 15.0000 mL | Freq: Once | OROMUCOSAL | Status: AC

## 2019-07-17 MED ORDER — DEXAMETHASONE SODIUM PHOSPHATE 10 MG/ML IJ SOLN
INTRAMUSCULAR | Status: DC | PRN
  Administered 2019-07-17: 10 mg via INTRAVENOUS

## 2019-07-17 MED ORDER — POVIDONE-IODINE 7.5 % EX SOLN
Freq: Once | CUTANEOUS | Status: DC
  Filled 2019-07-17: qty 118

## 2019-07-17 MED ORDER — CEFAZOLIN SODIUM-DEXTROSE 2-4 GM/100ML-% IV SOLN
INTRAVENOUS | Status: AC
  Filled 2019-07-17: qty 100

## 2019-07-17 MED ORDER — METHOCARBAMOL 500 MG PO TABS
ORAL_TABLET | ORAL | Status: AC
  Filled 2019-07-17: qty 1

## 2019-07-17 MED ORDER — MIDAZOLAM HCL 2 MG/2ML IJ SOLN
INTRAMUSCULAR | Status: AC
  Administered 2019-07-17: 1 mg via INTRAVENOUS
  Filled 2019-07-17: qty 2

## 2019-07-17 MED ORDER — POVIDONE-IODINE 10 % EX SWAB
2.0000 "application " | Freq: Once | CUTANEOUS | Status: AC
  Administered 2019-07-17: 2 via TOPICAL

## 2019-07-17 MED ORDER — ONDANSETRON HCL 4 MG/2ML IJ SOLN: 4.0000 mg | Freq: Four times a day (QID) | INTRAMUSCULAR | Status: DC | PRN

## 2019-07-17 MED ORDER — LACTATED RINGERS IV SOLN: INTRAVENOUS | Status: DC

## 2019-07-17 MED ORDER — CLONIDINE HCL (ANALGESIA) 100 MCG/ML EP SOLN
EPIDURAL | Status: DC | PRN
  Administered 2019-07-17: 100 ug

## 2019-07-17 MED ORDER — CEFAZOLIN SODIUM-DEXTROSE 2-4 GM/100ML-% IV SOLN
2.0000 g | INTRAVENOUS | Status: AC
  Administered 2019-07-17: 2 g via INTRAVENOUS

## 2019-07-17 MED ORDER — ACETAMINOPHEN 500 MG PO TABS
1000.0000 mg | ORAL_TABLET | Freq: Four times a day (QID) | ORAL | Status: AC
  Administered 2019-07-17 – 2019-07-18 (×4): 1000 mg via ORAL
  Filled 2019-07-17 (×3): qty 2

## 2019-07-17 MED ORDER — TRANEXAMIC ACID-NACL 1000-0.7 MG/100ML-% IV SOLN
1000.0000 mg | INTRAVENOUS | Status: AC
  Administered 2019-07-17: 1000 mg via INTRAVENOUS

## 2019-07-17 MED ORDER — BUPIVACAINE HCL 0.25 % IJ SOLN
INTRAMUSCULAR | Status: DC | PRN
  Administered 2019-07-17: 10 mL

## 2019-07-17 MED ORDER — LIDOCAINE 2% (20 MG/ML) 5 ML SYRINGE
INTRAMUSCULAR | Status: DC | PRN
  Administered 2019-07-17: 20 mg via INTRAVENOUS

## 2019-07-17 MED ORDER — HYDROMORPHONE HCL 1 MG/ML IJ SOLN
0.5000 mg | INTRAMUSCULAR | Status: DC | PRN
  Administered 2019-07-17 – 2019-07-19 (×5): 0.5 mg via INTRAVENOUS
  Filled 2019-07-17 (×5): qty 1

## 2019-07-17 MED ORDER — MORPHINE SULFATE (PF) 4 MG/ML IV SOLN
INTRAVENOUS | Status: AC
  Filled 2019-07-17: qty 1

## 2019-07-17 MED ORDER — MORPHINE SULFATE (PF) 4 MG/ML IV SOLN
INTRAVENOUS | Status: DC | PRN
  Administered 2019-07-17: 8 mg via INTRAMUSCULAR

## 2019-07-17 MED ORDER — AMLODIPINE BESYLATE 5 MG PO TABS
5.0000 mg | ORAL_TABLET | Freq: Every day | ORAL | Status: DC
  Administered 2019-07-18 – 2019-07-22 (×5): 5 mg via ORAL
  Filled 2019-07-17 (×5): qty 1

## 2019-07-17 MED ORDER — METHOCARBAMOL 500 MG PO TABS
500.0000 mg | ORAL_TABLET | Freq: Four times a day (QID) | ORAL | Status: DC | PRN
  Administered 2019-07-17 – 2019-07-22 (×11): 500 mg via ORAL
  Filled 2019-07-17 (×10): qty 1

## 2019-07-17 MED ORDER — OXYCODONE HCL 5 MG PO TABS
5.0000 mg | ORAL_TABLET | ORAL | Status: DC | PRN
  Administered 2019-07-17 – 2019-07-21 (×9): 10 mg via ORAL
  Administered 2019-07-21: 5 mg via ORAL
  Administered 2019-07-21 – 2019-07-22 (×3): 10 mg via ORAL
  Filled 2019-07-17 (×4): qty 2
  Filled 2019-07-17: qty 1
  Filled 2019-07-17 (×8): qty 2

## 2019-07-17 MED ORDER — FENTANYL CITRATE (PF) 100 MCG/2ML IJ SOLN
INTRAMUSCULAR | Status: AC
  Filled 2019-07-17: qty 2

## 2019-07-17 MED ORDER — FENTANYL CITRATE (PF) 250 MCG/5ML IJ SOLN
INTRAMUSCULAR | Status: DC | PRN
  Administered 2019-07-17 (×2): 50 ug via INTRAVENOUS

## 2019-07-17 MED ORDER — FENTANYL CITRATE (PF) 100 MCG/2ML IJ SOLN: 50.0000 ug | Freq: Once | INTRAMUSCULAR | Status: AC

## 2019-07-17 MED ORDER — DOCUSATE SODIUM 100 MG PO CAPS
100.0000 mg | ORAL_CAPSULE | Freq: Two times a day (BID) | ORAL | Status: DC
  Administered 2019-07-17 – 2019-07-22 (×10): 100 mg via ORAL
  Filled 2019-07-17 (×10): qty 1

## 2019-07-17 MED ORDER — METOCLOPRAMIDE HCL 5 MG/ML IJ SOLN: 5.0000 mg | Freq: Three times a day (TID) | INTRAMUSCULAR | Status: DC | PRN

## 2019-07-17 MED ORDER — MIDAZOLAM HCL 2 MG/2ML IJ SOLN
INTRAMUSCULAR | Status: AC
  Filled 2019-07-17: qty 2

## 2019-07-17 MED ORDER — FENTANYL CITRATE (PF) 100 MCG/2ML IJ SOLN
INTRAMUSCULAR | Status: AC
  Administered 2019-07-17: 50 ug via INTRAVENOUS
  Filled 2019-07-17: qty 2

## 2019-07-17 MED ORDER — PHENOL 1.4 % MT LIQD: 1.0000 | OROMUCOSAL | Status: DC | PRN

## 2019-07-17 MED ORDER — ETOMIDATE 2 MG/ML IV SOLN
INTRAVENOUS | Status: AC
  Filled 2019-07-17: qty 10

## 2019-07-17 MED ORDER — BUPIVACAINE IN DEXTROSE 0.75-8.25 % IT SOLN
INTRATHECAL | Status: DC | PRN
  Administered 2019-07-17: 2 mL via INTRATHECAL

## 2019-07-17 MED ORDER — MIDAZOLAM HCL 5 MG/5ML IJ SOLN
INTRAMUSCULAR | Status: DC | PRN
  Administered 2019-07-17: 1 mg via INTRAVENOUS

## 2019-07-17 MED ORDER — LIDOCAINE 2% (20 MG/ML) 5 ML SYRINGE
INTRAMUSCULAR | Status: AC
  Filled 2019-07-17: qty 5

## 2019-07-17 MED ORDER — PROPOFOL 10 MG/ML IV BOLUS
INTRAVENOUS | Status: DC | PRN
  Administered 2019-07-17 (×2): 20 mg via INTRAVENOUS

## 2019-07-17 MED ORDER — ROPIVACAINE HCL 7.5 MG/ML IJ SOLN
INTRAMUSCULAR | Status: DC | PRN
  Administered 2019-07-17: 20 mL via PERINEURAL

## 2019-07-17 MED ORDER — ALBUMIN HUMAN 5 % IV SOLN
INTRAVENOUS | Status: AC
  Filled 2019-07-17: qty 250

## 2019-07-17 MED ORDER — MECLIZINE HCL 25 MG PO TABS
25.0000 mg | ORAL_TABLET | Freq: Three times a day (TID) | ORAL | Status: DC | PRN
  Filled 2019-07-17: qty 1

## 2019-07-17 MED ORDER — MIDAZOLAM HCL 2 MG/2ML IJ SOLN: 1.0000 mg | Freq: Once | INTRAMUSCULAR | Status: AC

## 2019-07-17 MED ORDER — SODIUM CHLORIDE 0.9 % IR SOLN
Status: DC | PRN
  Administered 2019-07-17: 3000 mL

## 2019-07-17 MED ORDER — CELECOXIB 200 MG PO CAPS
200.0000 mg | ORAL_CAPSULE | Freq: Two times a day (BID) | ORAL | Status: DC
  Administered 2019-07-18 – 2019-07-22 (×10): 200 mg via ORAL
  Filled 2019-07-17 (×13): qty 1

## 2019-07-17 MED ORDER — ATORVASTATIN CALCIUM 40 MG PO TABS
40.0000 mg | ORAL_TABLET | Freq: Every day | ORAL | Status: DC
  Administered 2019-07-17 – 2019-07-21 (×5): 40 mg via ORAL
  Filled 2019-07-17 (×5): qty 1

## 2019-07-17 MED ORDER — TRANEXAMIC ACID 1000 MG/10ML IV SOLN
2000.0000 mg | Freq: Once | INTRAVENOUS | Status: AC
  Administered 2019-07-17: 2000 mg via TOPICAL
  Filled 2019-07-17: qty 20

## 2019-07-17 MED ORDER — MORPHINE SULFATE (PF) 4 MG/ML IV SOLN
INTRAVENOUS | Status: AC
  Filled 2019-07-17: qty 2

## 2019-07-17 MED ORDER — BUPIVACAINE HCL (PF) 0.25 % IJ SOLN
INTRAMUSCULAR | Status: AC
  Filled 2019-07-17: qty 30

## 2019-07-17 MED ORDER — TRANEXAMIC ACID 1000 MG/10ML IV SOLN
INTRAVENOUS | Status: DC | PRN
  Administered 2019-07-17: 2000 mg via TOPICAL

## 2019-07-17 MED ORDER — DEXAMETHASONE SODIUM PHOSPHATE 10 MG/ML IJ SOLN
INTRAMUSCULAR | Status: AC
  Filled 2019-07-17: qty 1

## 2019-07-17 MED ORDER — IRRISEPT - 450ML BOTTLE WITH 0.05% CHG IN STERILE WATER, USP 99.95% OPTIME
TOPICAL | Status: DC | PRN
  Administered 2019-07-17: 450 mL

## 2019-07-17 MED ORDER — BUPIVACAINE LIPOSOME 1.3 % IJ SUSP
20.0000 mL | Freq: Once | INTRAMUSCULAR | Status: AC
  Administered 2019-07-17: 20 mL
  Filled 2019-07-17: qty 20

## 2019-07-17 MED ORDER — 0.9 % SODIUM CHLORIDE (POUR BTL) OPTIME
TOPICAL | Status: DC | PRN
  Administered 2019-07-17: 1000 mL

## 2019-07-17 MED ORDER — METOCLOPRAMIDE HCL 5 MG PO TABS: 5.0000 mg | ORAL_TABLET | Freq: Three times a day (TID) | ORAL | Status: DC | PRN

## 2019-07-17 MED ORDER — ONDANSETRON HCL 4 MG/2ML IJ SOLN: 4.0000 mg | Freq: Once | INTRAMUSCULAR | Status: DC | PRN

## 2019-07-17 MED ORDER — FENTANYL CITRATE (PF) 100 MCG/2ML IJ SOLN
25.0000 ug | INTRAMUSCULAR | Status: DC | PRN
  Administered 2019-07-17: 25 ug via INTRAVENOUS
  Administered 2019-07-17 (×2): 50 ug via INTRAVENOUS

## 2019-07-17 MED ORDER — CLONIDINE HCL (ANALGESIA) 100 MCG/ML EP SOLN
EPIDURAL | Status: DC | PRN
  Administered 2019-07-17: 50 ug

## 2019-07-17 MED ORDER — ONDANSETRON HCL 4 MG PO TABS: 4.0000 mg | ORAL_TABLET | Freq: Four times a day (QID) | ORAL | Status: DC | PRN

## 2019-07-17 MED ORDER — PROPOFOL 10 MG/ML IV BOLUS
INTRAVENOUS | Status: AC
  Filled 2019-07-17: qty 20

## 2019-07-17 MED ORDER — ONDANSETRON HCL 4 MG/2ML IJ SOLN
INTRAMUSCULAR | Status: AC
  Filled 2019-07-17: qty 2

## 2019-07-17 MED ORDER — POVIDONE-IODINE 10 % EX SWAB: 2.0000 "application " | Freq: Once | CUTANEOUS | Status: DC

## 2019-07-17 MED ORDER — PHENYLEPHRINE HCL-NACL 10-0.9 MG/250ML-% IV SOLN
INTRAVENOUS | Status: AC
  Filled 2019-07-17: qty 250

## 2019-07-17 MED ORDER — METHOCARBAMOL 1000 MG/10ML IJ SOLN
500.0000 mg | Freq: Four times a day (QID) | INTRAVENOUS | Status: DC | PRN
  Filled 2019-07-17: qty 5

## 2019-07-17 MED ORDER — CHLORHEXIDINE GLUCONATE 0.12 % MT SOLN: 15.0000 mL | Freq: Once | OROMUCOSAL | Status: AC

## 2019-07-17 MED ORDER — PHENYLEPHRINE HCL-NACL 10-0.9 MG/250ML-% IV SOLN
INTRAVENOUS | Status: DC | PRN
Start: 2019-07-17 — End: 2019-07-17
  Administered 2019-07-17: 50 ug/min via INTRAVENOUS

## 2019-07-17 MED ORDER — BUPIVACAINE HCL 0.25 % IJ SOLN
INTRAMUSCULAR | Status: DC | PRN
  Administered 2019-07-17: 20 mL

## 2019-07-17 MED ORDER — CHLORHEXIDINE GLUCONATE 0.12 % MT SOLN
OROMUCOSAL | Status: AC
  Administered 2019-07-17: 15 mL via OROMUCOSAL
  Filled 2019-07-17: qty 15

## 2019-07-17 MED ORDER — LACTATED RINGERS IV SOLN: INTRAVENOUS | Status: DC | PRN

## 2019-07-17 SURGICAL SUPPLY — 79 items
BAG DECANTER FOR FLEXI CONT (MISCELLANEOUS) ×3 IMPLANT
BANDAGE ESMARK 6X9 LF (GAUZE/BANDAGES/DRESSINGS) ×1 IMPLANT
BLADE SAG 18X100X1.27 (BLADE) ×3 IMPLANT
BNDG CMPR 9X6 STRL LF SNTH (GAUZE/BANDAGES/DRESSINGS) ×1
BNDG CMPR MED 15X6 ELC VLCR LF (GAUZE/BANDAGES/DRESSINGS) ×1
BNDG COHESIVE 6X5 TAN STRL LF (GAUZE/BANDAGES/DRESSINGS) ×3 IMPLANT
BNDG ELASTIC 6X15 VLCR STRL LF (GAUZE/BANDAGES/DRESSINGS) ×2 IMPLANT
BNDG ESMARK 6X9 LF (GAUZE/BANDAGES/DRESSINGS) ×3
BSPLAT TIB 5D D CMNT STM LT (Knees) ×1 IMPLANT
CEMENT BONE SIMPLEX SPEEDSET (Cement) ×4 IMPLANT
CLOSURE WOUND 1/2 X4 (GAUZE/BANDAGES/DRESSINGS) ×2
CNTNR URN SCR LID CUP LEK RST (MISCELLANEOUS) ×1 IMPLANT
COMP FEM CEMT PERS SZ 7 LT (Joint) ×3 IMPLANT
COMPONENT FEM CMT PERS SZ7 LT (Joint) IMPLANT
CONT SPEC 4OZ STRL OR WHT (MISCELLANEOUS) ×6
COVER SURGICAL LIGHT HANDLE (MISCELLANEOUS) ×3 IMPLANT
CUFF TOURN SGL QUICK 34 (TOURNIQUET CUFF) ×3
CUFF TRNQT CYL 34X4.125X (TOURNIQUET CUFF) ×1 IMPLANT
DRAPE INCISE IOBAN 66X45 STRL (DRAPES) IMPLANT
DRAPE ORTHO SPLIT 77X108 STRL (DRAPES) ×9
DRAPE SURG ORHT 6 SPLT 77X108 (DRAPES) ×3 IMPLANT
DRAPE U-SHAPE 47X51 STRL (DRAPES) ×3 IMPLANT
DRSG AQUACEL AG ADV 3.5X14 (GAUZE/BANDAGES/DRESSINGS) ×2 IMPLANT
DURAPREP 26ML APPLICATOR (WOUND CARE) ×6 IMPLANT
ELECT CAUTERY BLADE 6.4 (BLADE) ×3 IMPLANT
ELECT REM PT RETURN 9FT ADLT (ELECTROSURGICAL) ×3
ELECTRODE REM PT RTRN 9FT ADLT (ELECTROSURGICAL) ×1 IMPLANT
GAUZE SPONGE 4X4 12PLY STRL LF (GAUZE/BANDAGES/DRESSINGS) ×2 IMPLANT
GLOVE BIOGEL PI IND STRL 7.0 (GLOVE) ×1 IMPLANT
GLOVE BIOGEL PI IND STRL 8 (GLOVE) ×1 IMPLANT
GLOVE BIOGEL PI INDICATOR 7.0 (GLOVE) ×2
GLOVE BIOGEL PI INDICATOR 8 (GLOVE) ×2
GLOVE ECLIPSE 7.0 STRL STRAW (GLOVE) ×3 IMPLANT
GLOVE ECLIPSE 8.0 STRL XLNG CF (GLOVE) ×3 IMPLANT
GOWN STRL REUS W/ TWL LRG LVL3 (GOWN DISPOSABLE) ×3 IMPLANT
GOWN STRL REUS W/TWL LRG LVL3 (GOWN DISPOSABLE) ×9
HANDPIECE INTERPULSE COAX TIP (DISPOSABLE) ×3
HDLS TROCR DRIL PIN KNEE 75 (PIN) ×3
HOOD PEEL AWAY FLYTE STAYCOOL (MISCELLANEOUS) ×9 IMPLANT
IMMOBILIZER KNEE 20 (SOFTGOODS)
IMMOBILIZER KNEE 20 THIGH 36 (SOFTGOODS) IMPLANT
IMMOBILIZER KNEE 22 UNIV (SOFTGOODS) IMPLANT
IMMOBILIZER KNEE 24 THIGH 36 (MISCELLANEOUS) IMPLANT
IMMOBILIZER KNEE 24 UNIV (MISCELLANEOUS)
INSERT ARTISURF SZ 6-7 LT (Insert) ×2 IMPLANT
KIT BASIN OR (CUSTOM PROCEDURE TRAY) ×3 IMPLANT
KIT TURNOVER KIT B (KITS) ×3 IMPLANT
MANIFOLD NEPTUNE II (INSTRUMENTS) ×3 IMPLANT
NDL SPNL 18GX3.5 QUINCKE PK (NEEDLE) ×1 IMPLANT
NEEDLE 22X1 1/2 (OR ONLY) (NEEDLE) ×6 IMPLANT
NEEDLE SPNL 18GX3.5 QUINCKE PK (NEEDLE) ×3 IMPLANT
NS IRRIG 1000ML POUR BTL (IV SOLUTION) ×6 IMPLANT
PACK TOTAL JOINT (CUSTOM PROCEDURE TRAY) ×3 IMPLANT
PAD ARMBOARD 7.5X6 YLW CONV (MISCELLANEOUS) ×6 IMPLANT
PAD CAST 4YDX4 CTTN HI CHSV (CAST SUPPLIES) ×1 IMPLANT
PADDING CAST COTTON 4X4 STRL (CAST SUPPLIES) ×3
PADDING CAST COTTON 6X4 STRL (CAST SUPPLIES) ×3 IMPLANT
PIN DRILL HDLS TROCAR 75 4PK (PIN) IMPLANT
SCREW FEMALE HEX FIX 25X2.5 (ORTHOPEDIC DISPOSABLE SUPPLIES) ×2 IMPLANT
SET HNDPC FAN SPRY TIP SCT (DISPOSABLE) ×1 IMPLANT
STEM POLY PAT PLY 32M KNEE (Knees) ×2 IMPLANT
STEM TIBIA 5 DEG SZ D L KNEE (Knees) IMPLANT
STRIP CLOSURE SKIN 1/2X4 (GAUZE/BANDAGES/DRESSINGS) ×2 IMPLANT
SUCTION FRAZIER HANDLE 10FR (MISCELLANEOUS) ×3
SUCTION TUBE FRAZIER 10FR DISP (MISCELLANEOUS) ×1 IMPLANT
SUT MNCRL AB 3-0 PS2 18 (SUTURE) ×3 IMPLANT
SUT VIC AB 0 CT1 27 (SUTURE) ×6
SUT VIC AB 0 CT1 27XBRD ANBCTR (SUTURE) ×3 IMPLANT
SUT VIC AB 1 CT1 27 (SUTURE) ×12
SUT VIC AB 1 CT1 27XBRD ANBCTR (SUTURE) ×5 IMPLANT
SUT VIC AB 2-0 CT1 27 (SUTURE) ×12
SUT VIC AB 2-0 CT1 TAPERPNT 27 (SUTURE) ×4 IMPLANT
SYR 30ML LL (SYRINGE) ×9 IMPLANT
SYR TB 1ML LUER SLIP (SYRINGE) ×3 IMPLANT
TIBIA STEM 5 DEG SZ D L KNEE (Knees) ×3 IMPLANT
TOWEL GREEN STERILE (TOWEL DISPOSABLE) ×6 IMPLANT
TOWEL GREEN STERILE FF (TOWEL DISPOSABLE) ×6 IMPLANT
TRAY CATH 16FR W/PLASTIC CATH (SET/KITS/TRAYS/PACK) ×2 IMPLANT
WATER STERILE IRR 1000ML POUR (IV SOLUTION) ×2 IMPLANT

## 2019-07-17 NOTE — Anesthesia Procedure Notes (Signed)
Anesthesia Regional Block: Adductor canal block   Pre-Anesthetic Checklist: ,, timeout performed, Correct Patient, Correct Site, Correct Laterality, Correct Procedure, Correct Position, site marked, Risks and benefits discussed,  Surgical consent,  Pre-op evaluation,  At surgeon's request and post-op pain management  Laterality: Left  Prep: chloraprep       Needles:  Injection technique: Single-shot  Needle Type: Echogenic Needle     Needle Length: 9cm  Needle Gauge: 21     Additional Needles:   Procedures:,,,, ultrasound used (permanent image in chart),,,,  Narrative:  Start time: 07/17/2019 11:50 AM End time: 07/17/2019 11:58 AM Injection made incrementally with aspirations every 5 mL.  Performed by: Personally  Anesthesiologist: Catalina Gravel, MD  Additional Notes: No pain on injection. No increased resistance to injection. Injection made in 5cc increments.  Good needle visualization.  Patient tolerated procedure well.

## 2019-07-17 NOTE — Anesthesia Procedure Notes (Signed)
Spinal  Patient location during procedure: OR Start time: 07/17/2019 1:16 PM End time: 07/17/2019 1:20 PM Staffing Performed: anesthesiologist  Anesthesiologist: Catalina Gravel, MD Preanesthetic Checklist Completed: patient identified, IV checked, risks and benefits discussed, surgical consent, monitors and equipment checked, pre-op evaluation and timeout performed Spinal Block Patient position: sitting Prep: DuraPrep and site prepped and draped Patient monitoring: continuous pulse ox and blood pressure Approach: midline Location: L3-4 Injection technique: single-shot Needle Needle type: Pencan  Needle gauge: 24 G Assessment Sensory level: T8 Additional Notes Functioning IV was confirmed and monitors were applied. Sterile prep and drape, including hand hygiene, mask and sterile gloves were used. The patient was positioned and the spine was prepped. The skin was anesthetized with lidocaine.  Free flow of clear CSF was obtained prior to injecting local anesthetic into the CSF.  The spinal needle aspirated freely following injection.  The needle was carefully withdrawn.  The patient tolerated the procedure well. Consent was obtained prior to procedure with all questions answered and concerns addressed. Risks including but not limited to bleeding, infection, nerve damage, paralysis, failed block, inadequate analgesia, allergic reaction, high spinal, itching and headache were discussed and the patient wished to proceed.   Hoy Morn, MD

## 2019-07-17 NOTE — H&P (Signed)
TOTAL KNEE ADMISSION H&P  Patient is being admitted for left total knee arthroplasty.  Subjective:  Chief Complaint:left knee pain.  HPI: [name], 69 y.o. female, has a history of pain and functional disability in the left knee due to arthritis and has failed non-surgical conservative treatments for greater than 12 weeks to includeNSAID's and/or analgesics, corticosteriod injections, flexibility and strengthening excercises, supervised PT with diminished ADL's post treatment and activity modification.  Onset of symptoms was gradual, starting 8 years ago with gradually worsening course since that time. The patient noted no past surgery on the left knee(s).  Patient currently rates pain in the left knee(s) at 9 out of 10 with activity. Patient has night pain, worsening of pain with activity and weight bearing, pain that interferes with activities of daily living, pain with passive range of motion, crepitus and joint swelling.  Patient has evidence of subchondral cysts, subchondral sclerosis and joint space narrowing by imaging studies. This patient has had Failure of all means of conservative treatment for this knee.  Her knee pain is debilitating.. There is no active infection.  Patient Active Problem List   Diagnosis Date Noted  . Asthma 06/12/2017  . Hyperlipidemia 06/12/2017  . Seizures ([place]) 06/12/2017  . Seizure ([place]) 06/12/2017  . GERD (gastroesophageal reflux disease) 10/31/2016  . Chronic rhinitis 10/31/2016  . Chronic cholecystitis 04/12/2015  . Preoperative clearance 03/17/2015  . Spastic hemiplegia affecting nondominant side ([place]) 10/27/2013  . Adhesive capsulitis of right shoulder 10/27/2013  . Chronic diastolic heart failure ([place]) 08/26/2013  . Dyslipidemia 08/26/2013  . Borderline diabetes 08/26/2013  . Cerebral infarction ([place]) 08/26/2013  . Acute CVA (cerebrovascular accident) ([place]) 08/23/2013  . Pre-syncope 08/17/2012  . Hypokalemia 08/17/2012  . ARTHRITIS 03/28/2010   . DE QUERVAIN'S TENOSYNOVITIS 03/28/2010  . Normocytic anemia 07/26/2009  . Moderate persistent asthma 03/09/2009  . DENTAL CARIES 03/09/2009  . CANDIDIASIS OF VULVA AND VAGINA 06/04/2008  . SKIN TAG 06/04/2008  . ARTHRITIS, KNEES, BILATERAL 05/22/2008  . HYPERTENSION, BENIGN ESSENTIAL 05/04/2008  . KNEE PAIN, BILATERAL 05/04/2008  . Cough, persistent 05/04/2008  . MENISCUS TEAR, RIGHT 12/07/2005   Past Medical History:  Diagnosis Date  . Arthritis   . Asthma   . Difficulty sleeping   . Gallstones   . History of kidney stones   . Hyperlipidemia   . Hypertension   . Preoperative clearance   . Spasmodic cough 06/2017   "IT COMES IN DIFFERENT SEASONS"  . Stroke ([place]) 2015   WEAKNESS L SIDE OF BODY     Past Surgical History:  Procedure Laterality Date  . CHOLECYSTECTOMY N/A 04/12/2015   Procedure: LAPAROSCOPIC CHOLECYSTECTOMY WITH INTRAOPERATIVE CHOLANGIOGRAM;  Surgeon: Greer Pickerel, MD;  Location: [facility];  Service: General;  Laterality: N/A;  . gall stones    . KNEE ARTHROSCOPY  2010  . TUBAL LIGATION      Current Facility-Administered Medications  Medication Dose Route Frequency Provider Last Rate Last Admin  . ceFAZolin (ANCEF) 2-4 GM/100ML-% IVPB           . ceFAZolin (ANCEF) IVPB 2g/100 mL premix  2 g Intravenous On Call to OR Magnant, Gerrianne Scale, PA-C      . lactated ringers infusion   Intravenous Continuous Roberts Gaudy, MD 10 mL/hr at 07/17/19 1122 New Bag at 07/17/19 1122  . povidone-iodine (BETADINE) 7.5 % scrub   Topical Once Magnant, Charles L, PA-C      . povidone-iodine 10 % swab 2 application  2 application Topical Once  Magnant, Charles L, PA-C      . tranexamic acid (CYKLOKAPRON) 1000MG /140mL IVPB           . tranexamic acid (CYKLOKAPRON) IVPB 1,000 mg  1,000 mg Intravenous To OR Magnant, Charles L, PA-C       Facility-Administered Medications Ordered in Other Encounters  Medication Dose Route Frequency Provider Last Rate Last Admin  . cloNIDine (DURACLON)  100 mcg/mL inj- OR Only   Infiltration Anesthesia Intra-op Catalina Gravel, MD   50 mcg at 07/17/19 1158  . ropivacaine (PF) 7.5 mg/mL (0.75%) (NAROPIN) injection   Peri-NEURAL Anesthesia Intra-op Catalina Gravel, MD   20 mL at 07/17/19 1158   No Known Allergies  Social History   Tobacco Use  . Smoking status: Former Smoker    Quit date: 04/08/1994    Years since quitting: 25.2  . Smokeless tobacco: Never Used  Substance Use Topics  . Alcohol use: No    Family History  Problem Relation Age of Onset  . Diabetes Mother   . Colon cancer Father 81  . Colon polyps Neg Hx   . Esophageal cancer Neg Hx   . Rectal cancer Neg Hx   . Stomach cancer Neg Hx      Review of Systems  Musculoskeletal: Positive for arthralgias.  All other systems reviewed and are negative.   Objective:  Physical Exam  HENT:  Head: Normocephalic.  Nose: Nose normal.  Mouth/Throat: Mucous membranes are moist.  Eyes: Pupils are equal, round, and reactive to light.  Cardiovascular: Normal rate.  Respiratory: Effort normal.  Musculoskeletal:     Cervical back: Normal range of motion.  Neurological: She is alert.  Skin: Skin is warm.  Psychiatric: Mood normal.  Examination of the left knee demonstrates palpable pedal pulses with intact extensor mechanism.  Patient has a significant flexion contracture of about 20 degrees.  She is able to extend the knee with her extensor mechanism but not beyond 20 degrees.  Collaterals are stable.  Slight varus alignment present.  No groin pain with internal extra rotation of the leg.  Vital signs in last 24 hours: Temp:  [98.2 F (36.8 C)] 98.2 F (36.8 C) (06/10 1046) Pulse Rate:  [94] 94 (06/10 1046) Resp:  [20] 20 (06/10 1046) BP: (136)/(67) 136/67 (06/10 1046) SpO2:  [97 %] 97 % (06/10 1046) Weight:  [100.2 kg] 100.2 kg (06/10 1046)  Labs:   Estimated body mass index is 39.15 kg/m as calculated from the following:   Height as of this encounter: 5'  3" (1.6 m).   Weight as of this encounter: 100.2 kg.   Imaging Review Plain radiographs demonstrate severe degenerative joint disease of the left knee(s). The overall alignment ismild varus. The bone quality appears to be fair for age and reported activity level.      Assessment/Plan:  End stage arthritis, left knee   The patient history, physical examination, clinical judgment of the provider and imaging studies are consistent with end stage degenerative joint disease of the left knee(s) and total knee arthroplasty is deemed medically necessary. The treatment options including medical management, injection therapy arthroscopy and arthroplasty were discussed at length. The risks and benefits of total knee arthroplasty were presented and reviewed. The risks due to aseptic loosening, infection, stiffness, patella tracking problems, thromboembolic complications and other imponderables were discussed. The patient acknowledged the explanation, agreed to proceed with the plan and consent was signed. Patient is being admitted for inpatient treatment for surgery, pain control, PT,  OT, prophylactic antibiotics, VTE prophylaxis, progressive ambulation and ADL's and discharge planning. The patient is planning to be discharged home with home health services    Anticipated LOS equal to or greater than 2 midnights due to - Age 61 and older with one or more of the following:  - Obesity  - Expected need for hospital services (PT, OT, Nursing) required for safe  discharge  - Anticipated need for postoperative skilled nursing care or inpatient rehab  - Active co-morbidities: None OR   - Unanticipated findings during/Post Surgery: None  - Patient is a high risk of re-admission due to: None

## 2019-07-17 NOTE — Brief Op Note (Signed)
    07/17/2019  4:31 PM  PATIENT:  [name]  69 y.o. female  PRE-OPERATIVE DIAGNOSIS:  left knee osteoarthritis  POST-OPERATIVE DIAGNOSIS:  left knee osteoarthritis  PROCEDURE:  Procedure(s): LEFT TOTAL KNEE ARTHROPLASTY  SURGEON:  Surgeon(s): Marlou Sa, Tonna Corner, MD  ASSISTANT: magnant pa  ANESTHESIA:   spinal  EBL: 100 ml    Total I/O In: 1000 [I.V.:1000] Out: 100 [Blood:100]  BLOOD ADMINISTERED: none  DRAINS: none   LOCAL MEDICATIONS USED: marcaine mso4 marcaine clonidine  SPECIMEN:  No Specimen  COUNTS:  YES  TOURNIQUET:   Total Tourniquet Time Documented: Thigh (Left) - 116 minutes Total: Thigh (Left) - 116 minutes   DICTATION: .Other Dictation: Dictation Number [id number]  PLAN OF CARE: Admit for overnight observation  PATIENT DISPOSITION:  PACU - hemodynamically stable

## 2019-07-17 NOTE — Anesthesia Postprocedure Evaluation (Signed)
Anesthesia Post Note  Patient: [name]  Procedure(s) Performed: LEFT TOTAL KNEE ARTHROPLASTY (Left Knee)     Patient location during evaluation: PACU Anesthesia Type: Regional and Spinal Level of consciousness: oriented and awake and alert Pain management: pain level controlled Vital Signs Assessment: post-procedure vital signs reviewed and stable Respiratory status: spontaneous breathing, respiratory function stable and patient connected to nasal cannula oxygen Cardiovascular status: blood pressure returned to baseline and stable Postop Assessment: no headache, no backache, no apparent nausea or vomiting and spinal receding Anesthetic complications: no   No complications documented.  Last Vitals:  Vitals:   07/17/19 1750 07/17/19 2026  BP: 130/84 134/70  Pulse: 75 100  Resp: 16 18  Temp: (!) 36.4 C 36.7 C  SpO2: 93% 91%    Last Pain:  Vitals:   07/17/19 2026  TempSrc: Oral  PainSc:                  Catalina Gravel

## 2019-07-17 NOTE — Progress Notes (Signed)
Orthopedic Tech Progress Note Patient Details:  [name] 30-Nov-1950 [id number]  CPM Left Knee CPM Left Knee: On Left Knee Flexion (Degrees): 40 Left Knee Extension (Degrees): 10  Post Interventions Patient Tolerated: Well Instructions Provided: Adjustment of device, Care of device   Did not provide patient with bone foam. Bone foams are temporarily out of stock and more will be ordered.  Tammy Sours 07/17/2019, 6:29 PM

## 2019-07-17 NOTE — Op Note (Signed)
NAME: [name], [name] MEDICAL RECORD [id number] ACCOUNT [id number] DATE OF BIRTH:July 20, 1950 FACILITY: [facility] LOCATION: [facility] PHYSICIAN:Cristan Scherzer Randel Pigg, MD  OPERATIVE REPORT  DATE OF PROCEDURE:  07/17/2019  PREOPERATIVE DIAGNOSIS:  Left knee arthritis.  POSTOPERATIVE DIAGNOSIS:  Left knee arthritis.  PROCEDURE:  Left total knee replacement using cemented components Biomet 7 narrow femur, D tibia, 32 patella, 10 mm medial congruent poly.  SURGEON:  Meredith Pel, MD  ASSISTANT:  Annie Main, PA.  INDICATIONS:  The patient is a 69 year old patient with left knee pain and arthritis.  She presents for operative management after explanation of risks and benefits.  PROCEDURE IN DETAIL:  The patient was brought to the operating room where spinal anesthetic was induced.  Preoperative antibiotics administered.  Timeout was called.  The left knee was pre-scrubbed with alcohol and Betadine, allowed to air dry, prepped  with DuraPrep solution and draped in sterile manner.  Ioban used to cover the operative field.  Timeout was called.  Leg was elevated, exsanguinated with the Esmarch wrap.  Tourniquet was inflated.  Anterior approach to knee was made.  Skin and  subcutaneous tissue were sharply divided.  Median parapatellar approach was made and marked with #1 Vicryl suture.  Patella was everted.  Lateral patellofemoral ligament was released.  Fat pad partially removed.  Soft tissue from the anterior distal  femur was released.  The medial sleeve was dissected around back to the semimembranosus bursa.  The patient had severe end-stage arthritis in all 3 compartments.  The ACL was released.  Anterior horn of the meniscus was released.  The PCL was released  from the tibia.  The intramedullary alignment was then used to make a cut 10 mm from the least affected lateral tibial plateau with the collaterals and posterior neurovascular structures protected.  Because of the patient's preoperative flexion   contracture, a 12 mm cut was made on the distal femur.  This allowed a 10 mm spacer to achieve full extension with good stability to varus and valgus stress.  Next, the anterior, posterior chamfer cuts were made.  PCL was tethered and released partially  from the tibial attachment in order to balance the flexion and extension gaps.  Next, with the extension gaps balanced, trial components placed.  Rotational alignment of the tibial tray was marked.  Tibia was keel punched and prepared.  Patella was then  cut down from 24 to 15.  A 3-peg trial was then placed.  The patient had full extension, excellent flexion with no liftoff past 105 degrees.  Collaterals were stable to varus and valgus stress at 0, 30 and 90 degrees.  Trial components were removed.   Thorough irrigation was performed.  TXA sponge placed for 3 minutes along with IrriSept solution, which was used after the incision and after the arthrotomy and at all times during the case.  After that, the components were cemented into position with  excess cement removed.  Next, the true poly was placed with the same stability parameters maintained.  Tourniquet was released.  Bleeding points encountered were controlled using electrocautery.  The arthrotomy was then closed using #1 Vicryl suture,  followed by interrupted inverted 0 Vicryl suture, 2-0 Vicryl suture and a 3-0 Monocryl.  Steri-Strips and Aquacel dressing applied.  A solution of Marcaine, morphine, clonidine injected into the knee for postop pain relief, along with a solution of  Exparel, Marcaine and saline, which was injected just before cementing.  The patient tolerated the procedure well without immediate complications,  transferred to the recovery room in stable condition.  Luke's assistance was required at all times during  the case for retraction, opening and closing.  His assistance was a medical necessity.  VN/NUANCE  D:07/17/2019 T:07/17/2019 JOB:[id number]

## 2019-07-17 NOTE — Transfer of Care (Signed)
Immediate Anesthesia Transfer of Care Note  Patient: [name]  Procedure(s) Performed: LEFT TOTAL KNEE ARTHROPLASTY (Left Knee)  Patient Location: PACU  Anesthesia Type:MAC, Regional and Spinal  Level of Consciousness: awake, alert , oriented and patient cooperative  Airway & Oxygen Therapy: Patient Spontanous Breathing and Patient connected to nasal cannula oxygen  Post-op Assessment: Report given to RN and Post -op Vital signs reviewed and stable  Post vital signs: Reviewed and stable  Last Vitals:  Vitals Value Taken Time  BP 143/91 07/17/19 1648  Temp    Pulse 72 07/17/19 1653  Resp 10 07/17/19 1653  SpO2 93 % 07/17/19 1653  Vitals shown include unvalidated device data.  Last Pain:  Vitals:   07/17/19 1640  TempSrc:   PainSc: 10-Worst pain ever      Patients Stated Pain Goal: 3 (91/79/15 0569)  Complications: No complications documented.

## 2019-07-17 NOTE — Anesthesia Procedure Notes (Signed)
Procedure Name: MAC Performed by: Michele Rockers, CRNA Pre-anesthesia Checklist: Patient identified, Emergency Drugs available, Suction available and Patient being monitored Patient Re-evaluated:Patient Re-evaluated prior to induction Oxygen Delivery Method: Simple face mask

## 2019-07-18 ENCOUNTER — Encounter (HOSPITAL_COMMUNITY): Payer: Self-pay | Admitting: Orthopedic Surgery

## 2019-07-18 DIAGNOSIS — M1712 Unilateral primary osteoarthritis, left knee: Secondary | ICD-10-CM | POA: Diagnosis not present

## 2019-07-18 NOTE — Care Management Obs Status (Signed)
[place] NOTIFICATION   Patient Details  Name: MARCHEL FOOTE MRN: [id number] Date of Birth: [birth year]   Medicare Observation Status Notification Given:  Yes    Curlene Labrum, RN 07/18/2019, 4:29 PM

## 2019-07-18 NOTE — Discharge Summary (Signed)
Physician Discharge Summary      Patient ID: [name] MRN: [id number] DOB/AGE: 70-27-1952 70 y.o.  Admit date: 07/17/2019 Discharge date: 07/22/2019  Admission Diagnoses:  Active Problems:   Arthritis of left knee   Discharge Diagnoses:  Same  Surgeries: Procedure(s): LEFT TOTAL KNEE ARTHROPLASTY on 07/17/2019   Consultants:   Discharged Condition: Stable  Hospital Course: [name] is an 69 y.o. female who was admitted 07/17/2019 with a chief complaint of left knee pain, and found to have a diagnosis of left knee OA.  They were brought to the operating room on 07/17/2019 and underwent the above named procedures.  Pt awoke from anesthesia without complication and was transferred to the floor. On POD1, patient's pain was controlled but mobility was limited.  Mobility improved over the course of her stay but she has no one to help her at home.  Patient will be discharged to SNF.  Pt will f/u with Dr. Marlou Sa in clinic in ~2 weeks.   Antibiotics given:  Anti-infectives (From admission, onward)   Start     Dose/Rate Route Frequency Ordered Stop   07/17/19 2000  ceFAZolin (ANCEF) IVPB 2g/100 mL premix        2 g 200 mL/hr over 30 Minutes Intravenous Every 6 hours 07/17/19 1740 07/18/19 0349   07/17/19 1045  ceFAZolin (ANCEF) IVPB 2g/100 mL premix        2 g 200 mL/hr over 30 Minutes Intravenous On call to O.R. 07/17/19 1037 07/17/19 1325   07/17/19 1044  ceFAZolin (ANCEF) 2-4 GM/100ML-% IVPB       Note to Pharmacy: Marga Melnick   : cabinet override      07/17/19 1044 07/17/19 1356    .  Recent vital signs:  Vitals:   07/18/19 0226 07/18/19 0830  BP: 126/87 134/68  Pulse: 86 98  Resp: 18 17  Temp: 97.8 F (36.6 C) 98.9 F (37.2 C)  SpO2: 94% 93%    Recent laboratory studies:  Results for orders placed or performed during the hospital encounter of 07/16/19  Surgical pcr screen   Specimen: Nasal Mucosa; Nasal Swab  Result Value Ref Range   MRSA, PCR NEGATIVE  NEGATIVE   Staphylococcus aureus NEGATIVE NEGATIVE  Basic metabolic panel  Result Value Ref Range   Sodium 140 135 - 145 mmol/L   Potassium 3.2 (L) 3.5 - 5.1 mmol/L   Chloride 107 98 - 111 mmol/L   CO2 24 22 - 32 mmol/L   Glucose, Bld 101 (H) 70 - 99 mg/dL   BUN 10 8 - 23 mg/dL   Creatinine, Ser 0.57 0.44 - 1.00 mg/dL   Calcium 9.0 8.9 - 10.3 mg/dL   GFR calc non Af Amer >60 >60 mL/min   GFR calc Af Amer >60 >60 mL/min   Anion gap 9 5 - 15  CBC  Result Value Ref Range   WBC 3.8 (L) 4.0 - 10.5 K/uL   RBC 4.15 3.87 - 5.11 MIL/uL   Hemoglobin 11.7 (L) 12.0 - 15.0 g/dL   HCT 36.9 36 - 46 %   MCV 88.9 80.0 - 100.0 fL   MCH 28.2 26.0 - 34.0 pg   MCHC 31.7 30.0 - 36.0 g/dL   RDW 13.8 11.5 - 15.5 %   Platelets 253 150 - 400 K/uL   nRBC 0.0 0.0 - 0.2 %    Discharge Medications:   Allergies as of 07/22/2019   No Known Allergies     Medication List  STOP taking these medications   ibuprofen 600 MG tablet Commonly known as: ADVIL     TAKE these medications   albuterol 1.25 MG/3ML nebulizer solution Commonly known as: ACCUNEB Take 1 ampule by nebulization in the morning and at bedtime.   amLODipine 5 MG tablet Commonly known as: NORVASC Take 5 mg by mouth daily.   aspirin EC 81 MG tablet Take 81 mg by mouth daily.   atorvastatin 40 MG tablet Commonly known as: LIPITOR Take 1 tablet (40 mg total) by mouth daily at 6 PM.   celecoxib 200 MG capsule Commonly known as: CELEBREX Take 1 capsule (200 mg total) by mouth 2 (two) times daily.   Flutter Devi Use as directed   meclizine 25 MG tablet Commonly known as: ANTIVERT Take 25 mg by mouth 3 (three) times daily as needed.   methocarbamol 500 MG tablet Commonly known as: ROBAXIN Take 1 tablet (500 mg total) by mouth every 8 (eight) hours as needed for muscle spasms.   Nebulizer Misc by Does not apply route.   oxyCODONE 5 MG immediate release tablet Commonly known as: Oxy IR/ROXICODONE Take 1 tablet (5 mg  total) by mouth every 4 (four) hours as needed for moderate pain (pain score 4-6).   Vitamin D (Ergocalciferol) 1.25 MG (50000 UNIT) Caps capsule Commonly known as: DRISDOL Take 50,000 Units by mouth once a week.       Diagnostic Studies: No results found.  Disposition:   SNF     Signed: Donella Stade 07/18/2019, 11:52 AM

## 2019-07-18 NOTE — Progress Notes (Signed)
Pt stable Pain ok In cpm dp 2/4 Df ok Ace removed PT today for rom  snf sat vs sun

## 2019-07-18 NOTE — Evaluation (Signed)
Physical Therapy Evaluation Patient Details Name: [name] MRN: [id number] DOB: 1950/06/02 Today's Date: 07/18/2019   History of Present Illness  Pt is a 69 y.o. F with significant PMH of CVA, seizures, and R knee meniscal tear who presents s/p left knee total arthoplasty 07/17/2019.   Clinical Impression  Prior to admission, pt lives alone, uses a quad cane for mobility and is independent with ADL's. Pt presents with decreased functional mobility secondary to baseline residual deficits from stroke on left side, left knee pain, decreased LLE strength/ROM, balance deficits, and decreased activity tolerance. Requiring moderate assist for bed mobility and basic stand pivot transfers. Pt unable to ambulate with heavy reliance through RLE and decreased weightbearing through LLE. Rest of session focused on seated exercises. In seated positioning, pt lacking 18 degrees from extension and achieving 78 degrees knee flexion. Recommending SNF in light of deficits listed above and decreased caregiver support.     Follow Up Recommendations SNF    Equipment Recommendations  None recommended by PT    Recommendations for Other Services OT consult     Precautions / Restrictions Precautions Precautions: Fall;Knee Precaution Booklet Issued: Yes (comment) Precaution Comments: verbally reviewed Restrictions Weight Bearing Restrictions: Yes LLE Weight Bearing: Weight bearing as tolerated      Mobility  Bed Mobility Overal bed mobility: Needs Assistance Bed Mobility: Supine to Sit     Supine to sit: Mod assist     General bed mobility comments: ModA for LLE negotiation off edge of bed and use of bed pad to scoot hips forward. Pt managing upper trunk  Transfers Overall transfer level: Needs assistance Equipment used: Rolling walker (2 wheeled) Transfers: Sit to/from [facility] Sit to Stand: Mod assist Stand pivot transfers: Mod assist       General transfer comment: ModA  to boost up to stand from edge of bed and BSC. Pt placing L hand on walker and pushing up with R hand. Increased bilateral knee flexion, unable to achieve upright posture. Pivoting from [facility] and then to recliner by shimmying, unable to achieve foot clearance and heavy reliance through RLE (decreased LLE weightbearing)  Ambulation/Gait             General Gait Details: unable  Stairs            Wheelchair Mobility    Modified Rankin (Stroke Patients Only)       Balance Overall balance assessment: Needs assistance Sitting-balance support: Feet supported Sitting balance-Leahy Scale: Fair     Standing balance support: Bilateral upper extremity supported Standing balance-Leahy Scale: Poor                               Pertinent Vitals/Pain Pain Assessment: Faces Faces Pain Scale: Hurts worst Pain Location: L knee with movement Pain Descriptors / Indicators: Operative site guarding;Grimacing;Guarding;Sharp Pain Intervention(s): Limited activity within patient's tolerance;Monitored during session;Patient requesting pain meds-RN notified;Ice applied    Home Living Family/patient expects to be discharged to:: Skilled nursing facility                 Additional Comments: Lives alone    Prior Function Level of Independence: Independent with assistive device(s)         Comments: Uses quad cane, independent ADL's, does not drive     Hand Dominance        Extremity/Trunk Assessment   Upper Extremity Assessment Upper Extremity Assessment: LUE deficits/detail LUE Deficits /  Details: Residual deficits from prior stroke    Lower Extremity Assessment Lower Extremity Assessment: LLE deficits/detail LLE Deficits / Details: s/p TKA, residual deficits from prior stroke and foot drop at baseline. Unable to perform LAQ, weak quad set       Communication   Communication: No difficulties  Cognition Arousal/Alertness: Awake/alert Behavior During  Therapy: WFL for tasks assessed/performed Overall Cognitive Status: Within Functional Limits for tasks assessed                                        General Comments      Exercises Total Joint Exercises Quad Sets: Left;10 reps;Seated Towel Squeeze: Both;10 reps;Seated Heel Slides: Left;10 reps;Seated Long Arc Quad: AROM;AAROM;Both;10 reps;Seated Goniometric ROM: Seated: 18-78 degrees General Exercises - Lower Extremity Hip Flexion/Marching: Right;10 reps;Seated   Assessment/Plan    PT Assessment Patient needs continued PT services  PT Problem List Decreased strength;Decreased range of motion;Decreased activity tolerance;Decreased balance;Decreased mobility;Impaired tone;Pain       PT Treatment Interventions DME instruction;Gait training;Functional mobility training;Therapeutic activities;Therapeutic exercise;Balance training;Patient/family education    PT Goals (Current goals can be found in the Care Plan section)  Acute Rehab PT Goals Patient Stated Goal: return to gardening PT Goal Formulation: With patient Time For Goal Achievement: 08/01/19 Potential to Achieve Goals: Good    Frequency 7X/week   Barriers to discharge Decreased caregiver support      Co-evaluation               AM-PAC PT "6 Clicks" Mobility  Outcome Measure Help needed turning from your back to your side while in a flat bed without using bedrails?: A Little Help needed moving from lying on your back to sitting on the side of a flat bed without using bedrails?: A Lot Help needed moving to and from a bed to a chair (including a wheelchair)?: A Lot Help needed standing up from a chair using your arms (e.g., wheelchair or bedside chair)?: A Lot Help needed to walk in hospital room?: Total Help needed climbing 3-5 steps with a railing? : Total 6 Click Score: 11    End of Session Equipment Utilized During Treatment: Gait belt Activity Tolerance: Patient limited by  pain Patient left: in chair;with call bell/phone within reach;with chair alarm set Nurse Communication: Mobility status;Patient requests pain meds PT Visit Diagnosis: Unsteadiness on feet (R26.81);Other abnormalities of gait and mobility (R26.89);Difficulty in walking, not elsewhere classified (R26.2);Hemiplegia and hemiparesis;Pain Hemiplegia - Right/Left: Left Hemiplegia - dominant/non-dominant: Non-dominant Pain - Right/Left: Left Pain - part of body: Knee    Time: 7209-4709 PT Time Calculation (min) (ACUTE ONLY): 34 min   Charges:   PT Evaluation $PT Eval Moderate Complexity: 1 Mod PT Treatments $Therapeutic Exercise: 8-22 mins          Wyona Almas, PT, DPT Acute Rehabilitation Services Pager [phone] Office [phone]   Deno Etienne 07/18/2019, 10:46 AM

## 2019-07-18 NOTE — Plan of Care (Signed)
  Problem: Education: Goal: Knowledge of General Education information will improve Description Including pain rating scale, medication(s)/side effects and non-pharmacologic comfort measures Outcome: Progressing   Problem: Health Behavior/Discharge Planning: Goal: Ability to manage health-related needs will improve Outcome: Progressing   

## 2019-07-19 DIAGNOSIS — M1712 Unilateral primary osteoarthritis, left knee: Secondary | ICD-10-CM | POA: Diagnosis not present

## 2019-07-19 NOTE — Progress Notes (Signed)
Patient ID: [name], female   DOB: 11-20-1950, 69 y.o.   MRN: [id number] Patient is status post total knee arthroplasty on the left she is making slow progress with physical therapy plan for discharge to skilled nursing.  The dressing is clean and dry she is using the CPM.

## 2019-07-19 NOTE — NC FL2 (Signed)
[place] LEVEL OF CARE SCREENING TOOL     IDENTIFICATION  Patient Name: [name] Birthdate: 1950-08-14 Sex: female Admission Date (Current Location): 07/17/2019  [facility] and [place] Number:  Herbalist and Address:         Provider Number: [id number]  Attending Physician Name and Address:  Meredith Pel, MD  Relative Name and Phone Number:       Current Level of Care: Hospital Recommended Level of Care: [place] Prior Approval Number:    Date Approved/Denied:   PASRR Number: [id number]  Discharge Plan: SNF    Current Diagnoses: Patient Active Problem List   Diagnosis Date Noted  . Arthritis of left knee 07/17/2019  . Asthma 06/12/2017  . Hyperlipidemia 06/12/2017  . Seizures ([place]) 06/12/2017  . Seizure ([place]) 06/12/2017  . GERD (gastroesophageal reflux disease) 10/31/2016  . Chronic rhinitis 10/31/2016  . Chronic cholecystitis 04/12/2015  . Preoperative clearance 03/17/2015  . Spastic hemiplegia affecting nondominant side ([place]) 10/27/2013  . Adhesive capsulitis of right shoulder 10/27/2013  . Chronic diastolic heart failure ([place]) 08/26/2013  . Dyslipidemia 08/26/2013  . Borderline diabetes 08/26/2013  . Cerebral infarction ([place]) 08/26/2013  . Acute CVA (cerebrovascular accident) ([employer]) 08/23/2013  . Pre-syncope 08/17/2012  . Hypokalemia 08/17/2012  . ARTHRITIS 03/28/2010  . DE QUERVAIN'S TENOSYNOVITIS 03/28/2010  . Normocytic anemia 07/26/2009  . Moderate persistent asthma 03/09/2009  . DENTAL CARIES 03/09/2009  . CANDIDIASIS OF VULVA AND VAGINA 06/04/2008  . SKIN TAG 06/04/2008  . ARTHRITIS, KNEES, BILATERAL 05/22/2008  . HYPERTENSION, BENIGN ESSENTIAL 05/04/2008  . KNEE PAIN, BILATERAL 05/04/2008  . Cough, persistent 05/04/2008  . MENISCUS TEAR, RIGHT 12/07/2005    Orientation RESPIRATION BLADDER Height & Weight     Self, Situation, Time, Place  Normal Continent Weight: 221 lb (100.2 kg) Height:   5\' 3"  (160 cm)  BEHAVIORAL SYMPTOMS/MOOD NEUROLOGICAL BOWEL NUTRITION STATUS      Continent Diet (see discharge summary)  AMBULATORY STATUS COMMUNICATION OF NEEDS Skin   Extensive Assist Verbally Surgical wounds (closed incision on left leg with compression wrap)                       Personal Care Assistance Level of Assistance  Bathing, Dressing, Feeding Bathing Assistance: Maximum assistance Feeding assistance: Independent Dressing Assistance: Maximum assistance     Functional Limitations Info  Hearing, Sight, Speech Sight Info: Adequate Hearing Info: Adequate Speech Info: Adequate    SPECIAL CARE FACTORS FREQUENCY  OT (By licensed OT), PT (By licensed PT)     PT Frequency: 5x week OT Frequency: 5x week            Contractures Contractures Info: Not present    Additional Factors Info  Code Status, Allergies Code Status Info: Full Code Allergies Info: No Known Allergies           Current Medications (07/19/2019):  This is the current hospital active medication list Current Facility-Administered Medications  Medication Dose Route Frequency Provider Last Rate Last Admin  . albuterol (PROVENTIL) (2.5 MG/3ML) 0.083% nebulizer solution 3 mL  3 mL Nebulization QID PRN Magnant, Charles L, PA-C      . amLODipine (NORVASC) tablet 5 mg  5 mg Oral Daily Magnant, Charles L, PA-C   5 mg at 07/19/19 0949  . aspirin EC tablet 81 mg  81 mg Oral Daily Magnant, Charles L, PA-C   81 mg at 07/19/19 0949  . atorvastatin (LIPITOR) tablet 40 mg  40 mg Oral q1800 Magnant, Charles L, PA-C   40 mg at 07/18/19 1753  . celecoxib (CELEBREX) capsule 200 mg  200 mg Oral BID Magnant, Charles L, PA-C   200 mg at 07/19/19 0949  . docusate sodium (COLACE) capsule 100 mg  100 mg Oral BID Magnant, Charles L, PA-C   100 mg at 07/19/19 0949  . HYDROmorphone (DILAUDID) injection 0.5 mg  0.5 mg Intravenous Q4H PRN Magnant, Charles L, PA-C   0.5 mg at 07/19/19 0529  . lactated ringers infusion    Intravenous Continuous Roberts Gaudy, MD 10 mL/hr at 07/18/19 2155 Rate Verify at 07/18/19 2155  . lactated ringers infusion   Intravenous Continuous Magnant, Charles L, PA-C 75 mL/hr at 07/18/19 1556 Rate Change at 07/18/19 1556  . meclizine (ANTIVERT) tablet 25 mg  25 mg Oral TID PRN Magnant, Charles L, PA-C      . menthol-cetylpyridinium (CEPACOL) lozenge 3 mg  1 lozenge Oral PRN Magnant, Charles L, PA-C       Or  . phenol (CHLORASEPTIC) mouth spray 1 spray  1 spray Mouth/Throat PRN Magnant, Charles L, PA-C      . methocarbamol (ROBAXIN) tablet 500 mg  500 mg Oral Q6H PRN Magnant, Charles L, PA-C   500 mg at 07/19/19 0529   Or  . methocarbamol (ROBAXIN) 500 mg in dextrose 5 % 50 mL IVPB  500 mg Intravenous Q6H PRN Magnant, Charles L, PA-C      . metoCLOPramide (REGLAN) tablet 5-10 mg  5-10 mg Oral Q8H PRN Magnant, Charles L, PA-C       Or  . metoCLOPramide (REGLAN) injection 5-10 mg  5-10 mg Intravenous Q8H PRN Magnant, Charles L, PA-C      . ondansetron (ZOFRAN) tablet 4 mg  4 mg Oral Q6H PRN Magnant, Charles L, PA-C       Or  . ondansetron (ZOFRAN) injection 4 mg  4 mg Intravenous Q6H PRN Magnant, Charles L, PA-C      . oxyCODONE (Oxy IR/ROXICODONE) immediate release tablet 5-10 mg  5-10 mg Oral Q4H PRN Magnant, Charles L, PA-C   10 mg at 07/19/19 4825     Discharge Medications: Please see discharge summary for a list of discharge medications.  Relevant Imaging Results:  Relevant Lab Results:   Additional Information SS# [facility], [place]

## 2019-07-19 NOTE — Plan of Care (Signed)

## 2019-07-19 NOTE — Plan of Care (Signed)
PT Cancellation Note  Patient Details Name: [name] MRN: [id number] DOB: Jun 22, 1950   Cancelled Treatment:     Patient adamantly refused morning treatment. She just got on the CPM and does not wish to get off it. Therapy explained to her the importance of getting out of bed but patient continued to refuse. Therapy will follow up later in the morning or the afternoon.    [name] PT DPT  07/19/2019, 10:32 AM

## 2019-07-19 NOTE — Progress Notes (Signed)
Occupational Therapy Evaluation Patient Details Name: [name] MRN: [id number] DOB: Apr 11, 1950 Today's Date: 07/19/2019    History of Present Illness Pt is a 69 y.o. F with significant PMH of CVA, seizures, and R knee meniscal tear who presents s/p left knee total arthoplasty 07/17/2019.    Clinical Impression   Pt presents with above diagnosis. PTA pt PLOF living at home alone mostly Mod I with ADLs for increased time and use of AE with support from daughters as needed. Pt currently limited with safe ADL engagement due to pain, weakness, and instability for functional transfers (BSC/Tub transfer). Pt will benefit from Additional acute OT to address established deficits to maximize independence in ADLs prior to DC to SNF to ensure safe transition to home setting with support.    Follow Up Recommendations  SNF;Supervision/Assistance - 24 hour    Equipment Recommendations  Tub/shower seat    Recommendations for Other Services       Precautions / Restrictions Precautions Precautions: Fall;Knee Precaution Booklet Issued: Yes (comment) Precaution Comments: verbally reviewed Restrictions Weight Bearing Restrictions: Yes LLE Weight Bearing: Weight bearing as tolerated      Mobility Bed Mobility Overal bed mobility: Needs Assistance Bed Mobility: Supine to Sit     Supine to sit: Mod assist     General bed mobility comments: mod a to get left leg to the edge of the bed. The patient lets therapy know when to move the leg.   Transfers Overall transfer level: Needs assistance Equipment used: Rolling walker (2 wheeled) Transfers: Sit to/from [facility] Sit to Stand: Mod assist;Max assist Stand pivot transfers: Max assist       General transfer comment: mod a for the first transfer. Patient would not weight bear on the left knee. She was able to scoot to the chair but had some difficulty using the walker because she does not use her left hand at baseline. She  may benefit more from a hemi walker or cane depsite signifcant mobility limitations. Once over to the chair she extened her back back and was unsafe sitting. She reported she was on the chair but she was jusr barelty on the chair. She reported she needed to get to the commode. Therapy used a stand pivot transfer so the walker would not get int he way. Once to the chair she again extended back hard . She was unable to sit and reported the vcommode was pinching her despite not being on it. She was transferd back to the bed wehre again she perfromed an unsafe transfer.  (Simultaneous filing. User may not have seen previous data.  Simultaneous filing. User may not have seen previous data.)    Balance Overall balance assessment: Needs assistance (Simultaneous filing. User may not have seen previous data.  Simultaneous filing. User may not have seen previous data.) Sitting-balance support: Feet supported (Simultaneous filing. User may not have seen previous data.  Simultaneous filing. User may not have seen previous data.) Sitting balance-Leahy Scale: Fair (Simultaneous filing. User may not have seen previous data.  Simultaneous filing. User may not have seen previous data.)     Standing balance support: Bilateral upper extremity supported Standing balance-Leahy Scale: Poor                             ADL either performed or assessed with clinical judgement   ADL Overall ADL's : Needs assistance/impaired Eating/Feeding: Set up;Bed level;Sitting Eating/Feeding Details (indicate cue type  and reason): pt limited with LUE due to residual weakness from prior CVA Grooming: Wash/dry hands;Wash/dry face;Oral care;Set up;Sitting   Upper Body Bathing: Modified independent   Lower Body Bathing: Moderate assistance   Upper Body Dressing : Modified independent   Lower Body Dressing: Moderate assistance     Toilet Transfer Details (indicate cue type and reason): pt declined out of BSC transfer,  due to increased pain.          Functional mobility during ADLs:  (defer to PT eval) General ADL Comments: Pt really fearful and guarding of LLE. Reports use of sock aide and long handled sponge for bathing and dressing.      Vision Baseline Vision/History: Wears glasses       Perception     Praxis      Pertinent Vitals/Pain Pain Assessment: 0-10 Faces Pain Scale: Hurts even more Pain Location: L knee with movement Pain Descriptors / Indicators: Operative site guarding;Grimacing;Guarding;Sharp Pain Intervention(s): Limited activity within patient's tolerance     Hand Dominance Right   Extremity/Trunk Assessment Upper Extremity Assessment Upper Extremity Assessment: LUE deficits/detail LUE Deficits / Details: Residual deficits from prior stroke   Lower Extremity Assessment Lower Extremity Assessment: Defer to PT evaluation LLE Deficits / Details: s/p TKA, residual deficits from prior stroke and foot drop at baseline. Unable to perform LAQ, weak quad set       Communication Communication Communication: No difficulties   Cognition Arousal/Alertness: Awake/alert Behavior During Therapy: WFL for tasks assessed/performed Overall Cognitive Status: Within Functional Limits for tasks assessed                                     General Comments       Exercises Exercises: Total Joint;General Lower Extremity Total Joint Exercises Quad Sets: Left;10 reps;Seated Towel Squeeze: Both;10 reps;Seated Heel Slides: Left;10 reps;Seated Long Arc Quad: AROM;AAROM;Both;10 reps;Seated Goniometric ROM: 20-75  General Exercises - Lower Extremity Hip Flexion/Marching: Right;10 reps;Seated   Shoulder Instructions      Home Living Family/patient expects to be discharged to:: Private residence Living Arrangements: Alone Available Help at Discharge: Family Type of Home: Apartment Home Access: Level entry     Home Layout: One level     Bathroom Shower/Tub:  Teacher, early years/pre: Standard Bathroom Accessibility: Yes   Home Equipment: [place] - single point;Walker - 2 wheels;Shower seat;Toilet riser;Bedside commode;Grab bars - tub/shower (needs new shower chair)   Additional Comments: Lives alone, daughters available to assist as needed.       Prior Functioning/Environment Level of Independence: Independent with assistive device(s)        Comments: Uses quad cane, Mod I with ADLs, does not drive        OT Problem List: Decreased strength;Decreased activity tolerance;Impaired balance (sitting and/or standing);Decreased safety awareness;Decreased knowledge of use of DME or AE;Decreased knowledge of precautions;Pain      OT Treatment/Interventions: Self-care/ADL training;Therapeutic exercise;DME and/or AE instruction;Therapeutic activities;Manual therapy;Patient/family education;Balance training    OT Goals(Current goals can be found in the care plan section) Acute Rehab OT Goals Patient Stated Goal: return to gardening OT Goal Formulation: With patient Time For Goal Achievement: 08/02/19 Potential to Achieve Goals: Fair  OT Frequency: Min 2X/week   Barriers to D/C: Decreased caregiver support          Co-evaluation              AM-PAC OT "6 Clicks"  Daily Activity     Outcome Measure Help from another person eating meals?: A Little Help from another person taking care of personal grooming?: A Little Help from another person toileting, which includes using toliet, bedpan, or urinal?: A Lot Help from another person bathing (including washing, rinsing, drying)?: A Lot Help from another person to put on and taking off regular upper body clothing?: A Little Help from another person to put on and taking off regular lower body clothing?: A Lot 6 Click Score: 15   End of Session CPM Left Knee CPM Left Knee: Off Left Knee Flexion (Degrees): 40 Left Knee Extension (Degrees): 10 (10)  Activity Tolerance:   Patient  left:    OT Visit Diagnosis: Unsteadiness on feet (R26.81);Muscle weakness (generalized) (M62.81);Pain Pain - Right/Left: Left Pain - part of body: Knee                Time: 6147-0929 OT Time Calculation (min): 21 min Charges:  OT General Charges $OT Visit: 1 Visit OT Evaluation $OT Eval Low Complexity: [place], MSOT, OTR/L  Supplemental Rehabilitation Services  [phone]   Marius Ditch 07/19/2019, 4:22 PM

## 2019-07-20 ENCOUNTER — Encounter (HOSPITAL_COMMUNITY): Payer: Self-pay | Admitting: Orthopedic Surgery

## 2019-07-20 DIAGNOSIS — M1712 Unilateral primary osteoarthritis, left knee: Secondary | ICD-10-CM | POA: Diagnosis not present

## 2019-07-20 NOTE — Progress Notes (Signed)
Physical Therapy Treatment Patient Details Name: [name] MRN: [id number] DOB: 09-09-1950 Today's Date: 07/20/2019    History of Present Illness Pt is a 69 y.o. F with significant PMH of CVA, seizures, and R knee meniscal tear who presents s/p left knee total arthoplasty 07/17/2019.     PT Comments    Patient progressing slowly towards PT goals. Reports pain in abdomen and knee today, reporting she feels like she needs to have a BM. Requires Mod A of 2 for bed mobility with difficulty moving LLE to EOB. Requires assist of 2 to stand from EOB with pt reluctant to place weight through LLE.  Able to take a few pivotal steps but not forward gait. Tolerated there ex; knee AROM 20-70 degrees. RN aware pt sitting on BSC. Continue to recommend SNF. Will follow.    Follow Up Recommendations  SNF     Equipment Recommendations  None recommended by PT    Recommendations for Other Services       Precautions / Restrictions Precautions Precautions: Fall;Knee Precaution Booklet Issued: No Restrictions Weight Bearing Restrictions: Yes LLE Weight Bearing: Weight bearing as tolerated    Mobility  Bed Mobility Overal bed mobility: Needs Assistance Bed Mobility: Supine to Sit     Supine to sit: Mod assist;+2 for physical assistance;HOB elevated     General bed mobility comments: Pt assisting to bring HOB up; assist with LLE and trunk to get to EOB. Increased time.  Transfers Overall transfer level: Needs assistance Equipment used: Rolling walker (2 wheeled) Transfers: Sit to/from Stand Sit to Stand: Mod assist;+2 physical assistance;From elevated surface Stand pivot transfers: Mod assist;+2 safety/equipment       General transfer comment: Assist of 2 to power to standing with cues for hand placement/technique. Stood from EOB x1, from [facility] x1. Pt reluctant to place weight through LLE. Difficulty descending due to pain with knee flexion. SPT bed to [facility] with Mod A of 2 and assist with RW  management.  Ambulation/Gait                 Stairs             Wheelchair Mobility    Modified Rankin (Stroke Patients Only)       Balance Overall balance assessment: Needs assistance Sitting-balance support: Feet supported;No upper extremity supported Sitting balance-Leahy Scale: Fair     Standing balance support: During functional activity Standing balance-Leahy Scale: Poor Standing balance comment: Requires UE support and external support for standing.                            Cognition Arousal/Alertness: Awake/alert Behavior During Therapy: WFL for tasks assessed/performed Overall Cognitive Status: No family/caregiver present to determine baseline cognitive functioning                                        Exercises Total Joint Exercises Quad Sets: Left;10 reps;Supine Towel Squeeze: Both;10 reps;Seated Heel Slides: Left;10 reps;Seated Long Arc Quad: AAROM;10 reps;Seated;Left Goniometric ROM: 20-70 degrees knee AROM    General Comments General comments (skin integrity, edema, etc.): bandage with a few spots of blood underneath      Pertinent Vitals/Pain Pain Assessment: Faces Faces Pain Scale: Hurts little more Pain Location: Lft knee with movement Pain Descriptors / Indicators: Operative site guarding;Grimacing;Guarding Pain Intervention(s): Monitored during session;Repositioned;Limited activity within patient's tolerance  Home Living                      Prior Function            PT Goals (current goals can now be found in the care plan section) Progress towards PT goals: Progressing toward goals (slowly)    Frequency    7X/week      PT Plan Current plan remains appropriate    Co-evaluation              AM-PAC PT "6 Clicks" Mobility   Outcome Measure  Help needed turning from your back to your side while in a flat bed without using bedrails?: A Little Help needed moving from  lying on your back to sitting on the side of a flat bed without using bedrails?: A Lot Help needed moving to and from a bed to a chair (including a wheelchair)?: A Lot Help needed standing up from a chair using your arms (e.g., wheelchair or bedside chair)?: A Lot Help needed to walk in hospital room?: A Lot Help needed climbing 3-5 steps with a railing? : Total 6 Click Score: 12    End of Session Equipment Utilized During Treatment: Gait belt Activity Tolerance: Patient limited by pain;Other (comment) (needing to have a BM) Patient left: Other (comment) (sitting on BSC with call bell in hand; RN aware to check on pt) Nurse Communication: Mobility status;Other (comment) (pt left on BSC) PT Visit Diagnosis: Unsteadiness on feet (R26.81);Other abnormalities of gait and mobility (R26.89);Difficulty in walking, not elsewhere classified (R26.2);Hemiplegia and hemiparesis;Pain Hemiplegia - Right/Left: Left Hemiplegia - dominant/non-dominant: Non-dominant     Time: 1001-1035 PT Time Calculation (min) (ACUTE ONLY): 34 min  Charges:  $Therapeutic Activity: 8-22 mins                     Marisa Severin, PT, DPT Acute Rehabilitation Services Pager [phone] Office [phone]       [name] 07/20/2019, 12:48 PM

## 2019-07-20 NOTE — Progress Notes (Signed)
Patient ID: [name], female   DOB: 08-28-1950, 69 y.o.   MRN: [id number] Patient with slow progress with her total knee arthroplasty on the left.  Anticipate discharge to skilled nursing on Monday.  Patient without complaints this morning.

## 2019-07-21 DIAGNOSIS — M1712 Unilateral primary osteoarthritis, left knee: Secondary | ICD-10-CM | POA: Diagnosis not present

## 2019-07-21 LAB — SARS CORONAVIRUS 2 (TAT 6-24 HRS): SARS Coronavirus 2: NEGATIVE

## 2019-07-21 NOTE — TOC Transition Note (Signed)
Transition of Care [facility]) - CM/SW Discharge Note   Patient Details  Name: [name] MRN: [id number] Date of Birth: 07/27/1950  [facility]) CM/SW Contact:  Curlene Labrum, RN Phone Number: 07/21/2019, 9:52 AM   Clinical Narrative:     Case management faxed clinicals to [facility] for insurance authorization review and approval.  Placed SNF placements in the hub and pending patient choice for SNF facility based on insurance auth for select SNF facilities.  Final next level of care: Skilled Nursing Facility Barriers to Discharge: Insurance Authorization   Patient Goals and CMS Choice Patient states their goals for this hospitalization and ongoing recovery are:: Patient plans to discharge to SNF facility since she lives at home alone. CMS Medicare.gov Compare Post Acute Care list provided to:: Patient Choice offered to / list presented to : Patient  Discharge Placement                       Discharge Plan and Services   Discharge Planning Services: CM Consult                                 Social Determinants of Health (SDOH) Interventions     Readmission Risk Interventions No flowsheet data found.

## 2019-07-21 NOTE — Progress Notes (Signed)
Orthopedic Tech Progress Note Patient Details:  [name] 1950-03-10 [id number] Martin Majestic to apply CPM to patient but she had just gotten up to the chair and wanted to wait until she got back into bed. I did set CPM up for 10-40 degrees.  Patient ID: [name], female   DOB: 1950-07-08, 69 y.o.   MRN: [id number]   Janit Pagan 07/21/2019, 1:48 PM

## 2019-07-21 NOTE — Plan of Care (Signed)
  Problem: Education: Goal: Knowledge of General Education information will improve Description: Including pain rating scale, medication(s)/side effects and non-pharmacologic comfort measures Outcome: Adequate for Discharge   

## 2019-07-21 NOTE — Progress Notes (Signed)
Pt is refusing to get out of bed to use BSC. She is a three person assist based on report from day shift. She will need a covid test ordered for SNF placement since last recorded test was 6/4/

## 2019-07-21 NOTE — Plan of Care (Signed)

## 2019-07-21 NOTE — TOC Transition Note (Signed)
Transition of Care [facility]) - CM/SW Discharge Note   Patient Details  Name: RESHMA HOEY MRN: [id number] Date of Birth: [birth year]  [facility]) CM/SW Contact:  Curlene Labrum, RN Phone Number: 07/21/2019, 1:12 PM   Clinical Narrative:    Case management faxed clinicals to [facility] for Insurance authorization under the ref # [id number].  I spoke with the patient and the patient would like to be placed for rehab at [place].  [facility] called and spoke with Prem, RNCM with [facility] and Clinicals faxed to [facility] at [phone].  Patient's first choice is [facility] SN[facility] and second choice is [facility].  Will continue to follow for [employer].   Final next level of care: Skilled Nursing Facility Barriers to Discharge: Insurance Authorization   Patient Goals and CMS Choice Patient states their goals for this hospitalization and ongoing recovery are:: Patient plans to discharge to SNF facility since she lives at home alone. CMS Medicare.gov Compare Post Acute Care list provided to:: Patient Choice offered to / list presented to : Patient  Discharge Placement                       Discharge Plan and Services   Discharge Planning Services: CM Consult                                 Social Determinants of Health (SDOH) Interventions     Readmission Risk Interventions No flowsheet data found.

## 2019-07-21 NOTE — Progress Notes (Signed)
Physical Therapy Treatment Patient Details Name: [name] MRN: [id number] DOB: 1950/04/14 Today's Date: 07/21/2019    History of Present Illness Pt is a 69 y.o. F with significant PMH of CVA, seizures, and R knee meniscal tear who presents s/p left knee total arthoplasty 07/17/2019.     PT Comments    Patient required significant assist for transfers. Therapy attempted to transfer patient to the commode but patient fully extended and would not bend her knees to sit. Therapy was able to get her back to a chair but again she would not bend her knees to sit. She would also not weight bear on the left LE despite cuing. She was limited by pain. She would benefit from further skilled therapy. At this time she would benefit most from rehab at a SNF.    Follow Up Recommendations  SNF     Equipment Recommendations  None recommended by PT    Recommendations for Other Services       Precautions / Restrictions Precautions Precautions: Fall;Knee Precaution Booklet Issued: Yes (comment) Precaution Comments: verbally reviewed    Mobility  Bed Mobility Overal bed mobility: Needs Assistance Bed Mobility: Supine to Sit     Supine to sit: Mod assist     General bed mobility comments: mod a to get left leg to the edge of the bed. The patient lets therapy know when to move the leg.   Transfers Overall transfer level: Needs assistance Equipment used: Rolling walker (2 wheeled) Transfers: Sit to/from [facility] Sit to Stand: Mod assist Stand pivot transfers: Mod assist       General transfer comment: mod a for the first transfer. Patient would not weight bear on the left knee. She was able to scoot to the chair but had some difficulty using the walker because she does not use her left hand at baseline. She may benefit more from a hemi walker or cane depsite signifcant mobility limitations. Once over to the chair she extened her back back and was unsafe sitting. She  reported she was on the chair but she was jusr barelty on the chair. She reported she needed to get to the commode. Therapy used a stand pivot transfer so the walker would not get int he way. Once to the chair she again extended back hard . She was unable to sit and reported the vcommode was pinching her despite not being on it. She was transferd back to the bed where again she perfromed an unsafe transfer fully extending her back back and not bending her knees. Therapy was able to get her back to bed so the bed pan could be placed under her.  ( )  Ambulation/Gait             General Gait Details: scooted her foot. She would not weight bear on the left LE despite cuing    Stairs             Wheelchair Mobility    Modified Rankin (Stroke Patients Only)       Balance Overall balance assessment: Needs assistance ( ) Sitting-balance support: Feet supported ( ) Sitting balance-Leahy Scale: Fair ( )     Standing balance support: Bilateral upper extremity supported Standing balance-Leahy Scale: Poor                              Cognition Arousal/Alertness: Awake/alert Behavior During Therapy: WFL for tasks assessed/performed Overall Cognitive  Status: Within Functional Limits for tasks assessed                                        Exercises Total Joint Exercises Quad Sets: Left;10 reps;Seated Towel Squeeze: Both;10 reps;Seated Heel Slides: Left;10 reps;Seated Long Arc Quad: AROM;AAROM;Both;10 reps;Seated General Exercises - Lower Extremity Hip Flexion/Marching: Right;10 reps;Seated    General Comments        Pertinent Vitals/Pain Faces Pain Scale: Hurts even more Pain Location: L knee with movement Pain Descriptors / Indicators: Operative site guarding;Grimacing;Guarding;[place]                      Prior Function            PT Goals (current goals can now be found in the care plan section) Acute Rehab PT  Goals Patient Stated Goal: return to gardening PT Goal Formulation: With patient Time For Goal Achievement: 08/01/19 Potential to Achieve Goals: Good    Frequency    7X/week      PT Plan Current plan remains appropriate    Co-evaluation              AM-PAC PT "6 Clicks" Mobility   Outcome Measure  Help needed turning from your back to your side while in a flat bed without using bedrails?: A Little Help needed moving from lying on your back to sitting on the side of a flat bed without using bedrails?: A Lot Help needed moving to and from a bed to a chair (including a wheelchair)?: A Lot Help needed standing up from a chair using your arms (e.g., wheelchair or bedside chair)?: A Lot Help needed to walk in hospital room?: Total Help needed climbing 3-5 steps with a railing? : Total 6 Click Score: 11    End of Session         PT Visit Diagnosis: Unsteadiness on feet (R26.81);Other abnormalities of gait and mobility (R26.89);Difficulty in walking, not elsewhere classified (R26.2);Hemiplegia and hemiparesis;Pain Hemiplegia - Right/Left: Left Hemiplegia - dominant/non-dominant: Non-dominant Pain - Right/Left: Left Pain - part of body: Knee     Time:  -     Charges:                           [name] PT DPT  07/21/2019, 8:03 AM

## 2019-07-21 NOTE — TOC Transition Note (Signed)
Transition of Care [facility]) - CM/SW Discharge Note   Patient Details  Name: [name] MRN: [id number] Date of Birth: 03-09-1950  [facility] of [facility]) CM/SW Contact:  Curlene Labrum, RN Phone Number: 07/21/2019, 4:49 PM   Clinical Narrative:    Case management spoke with the patient and daughter regarding SNF placement.  [facility] was not in network with the patient's insurance and [facility] did not have any available beds.  Daughter and patient chose [facility] as a top choice this afternoon as an alternative.  I called [facility] and left a message - I will call [facility] back to switch insurance approved over to [facility] if bed available.   Final next level of care: Skilled Nursing Facility Barriers to Discharge: Insurance Authorization   Patient Goals and CMS Choice Patient states their goals for this hospitalization and ongoing recovery are:: Patient plans to discharge to SNF facility since she lives at home alone. CMS Medicare.gov Compare Post Acute Care list provided to:: Patient Choice offered to / list presented to : Patient  Discharge Placement                       Discharge Plan and Services   Discharge Planning Services: CM Consult                                 Social Determinants of Health (SDOH) Interventions     Readmission Risk Interventions No flowsheet data found.

## 2019-07-21 NOTE — Progress Notes (Signed)
  Subjective: [name] is a 69 y.o. female s/p left TKA.  They are POD4.  Pt's pain is controlled.  Pt denies numbness/tingling/weakness.  Pt has ambulated with some difficulty.  No major complaints. Awaiting discharge to SNF.   Objective: Vital signs in last 24 hours: Temp:  [98.4 F (36.9 C)-98.7 F (37.1 C)] 98.4 F (36.9 C) (06/14 0843) Pulse Rate:  [90-98] 95 (06/14 0843) Resp:  [14-17] 15 (06/14 0843) BP: (133-139)/(75-84) 133/84 (06/14 0843) SpO2:  [94 %-99 %] 96 % (06/14 0843)  Intake/Output from previous day: 06/13 0701 - 06/14 0700 In: 640 [P.O.:640] Out: -  Intake/Output this shift: Total I/O In: 240 [P.O.:240] Out: -   Exam:  No gross blood or drainage overlying the dressing 2+ DP pulse Sensation intact distally in the left foot Able to dorsiflex and plantarflex the left foot   Labs: No results for input(s): HGB in the last 72 hours. No results for input(s): WBC, RBC, HCT, PLT in the last 72 hours. No results for input(s): NA, K, CL, CO2, BUN, CREATININE, GLUCOSE, CALCIUM in the last 72 hours. No results for input(s): LABPT, INR in the last 72 hours.  Assessment/Plan: Pt is POD4 s/p left TKA.    -Plan to discharge to SNF soon pending approval  -WBAT with a walker  -Follow-up with Dr. Marlou Sa ~2 weeks postop.       [name] L [name] 07/21/2019, 10:10 AM

## 2019-07-21 NOTE — Progress Notes (Signed)
Physical Therapy Treatment Patient Details Name: [name] MRN: [id number] DOB: 1950-06-20 Today's Date: 07/21/2019    History of Present Illness Pt is a 69 y.o. F with significant PMH of CVA, seizures, and R knee meniscal tear who presents s/p left knee total arthoplasty 07/17/2019.     PT Comments    Patient progressing well towards PT goals. Able to perform gait training today with Min guard assist and use of RW for support. Continues to demonstrate increased knee flexion and decreased foot clearance bilaterally but able to place weight through LLE today. Knee AROM 15-75 degrees, but pt reluctant to flex knee with any transitions due to pain. Will continue to follow.    Follow Up Recommendations  SNF     Equipment Recommendations  None recommended by PT    Recommendations for Other Services       Precautions / Restrictions Precautions Precautions: Fall;Knee Precaution Booklet Issued: Yes (comment) Precaution Comments: verbally reviewed Restrictions Weight Bearing Restrictions: Yes LLE Weight Bearing: Weight bearing as tolerated    Mobility  Bed Mobility Overal bed mobility: Needs Assistance Bed Mobility: Supine to Sit     Supine to sit: Min assist     General bed mobility comments: assist to bring LLE to EOB, increased time and use of rail.  Transfers Overall transfer level: Needs assistance Equipment used: Rolling walker (2 wheeled) Transfers: Sit to/from Stand Sit to Stand: Min assist Stand pivot transfers: Min assist;+2 safety/equipment       General transfer comment: Assist to power to standing with good demo of hand placement; placing more weight through LLE, increased knee flexion. SPT bed to [facility] with Min A of 2 for safety. Needs helps extending LLE upon sitting with pt plopping onto surface  Ambulation/Gait Ambulation/Gait assistance: Min guard Gait Distance (Feet): 10 Feet Assistive device: Rolling walker (2 wheeled) Gait Pattern/deviations:  Step-to pattern;Decreased step length - right;Decreased step length - left;Decreased stance time - left;Trunk flexed Gait velocity: decreased   General Gait Details: Slow, unsteady gait with increased knee flexion bilaterally, decreased foot clearance as well. Fatigues.   Stairs             Wheelchair Mobility    Modified Rankin (Stroke Patients Only)       Balance Overall balance assessment: Needs assistance Sitting-balance support: Feet supported;No upper extremity supported Sitting balance-Leahy Scale: Fair     Standing balance support: During functional activity Standing balance-Leahy Scale: Poor Standing balance comment: Requires UE support and external support for standing.                            Cognition Arousal/Alertness: Awake/alert Behavior During Therapy: WFL for tasks assessed/performed Overall Cognitive Status: Within Functional Limits for tasks assessed                                        Exercises Total Joint Exercises Knee Flexion: AROM;Left;5 reps;Seated Goniometric ROM: ~15 degrees to 75 degrees AROM General Exercises - Lower Extremity Long Arc Quad: AAROM;Left;10 reps;Seated    General Comments        Pertinent Vitals/Pain Pain Assessment: Faces Faces Pain Scale: Hurts little more Pain Location: L knee with movement Pain Descriptors / Indicators: Operative site guarding;Grimacing;Guarding;Sharp Pain Intervention(s): Monitored during session;Repositioned;Limited activity within patient's tolerance    Home Living  Prior Function            PT Goals (current goals can now be found in the care plan section) Progress towards PT goals: Progressing toward goals    Frequency    7X/week      PT Plan Current plan remains appropriate    Co-evaluation              AM-PAC PT "6 Clicks" Mobility   Outcome Measure  Help needed turning from your back to your side  while in a flat bed without using bedrails?: A Little Help needed moving from lying on your back to sitting on the side of a flat bed without using bedrails?: A Little Help needed moving to and from a bed to a chair (including a wheelchair)?: A Little   Help needed to walk in hospital room?: A Little Help needed climbing 3-5 steps with a railing? : Total 6 Click Score: 13    End of Session Equipment Utilized During Treatment: Gait belt Activity Tolerance: Patient tolerated treatment well Patient left: in chair;with call bell/phone within reach;with chair alarm set Nurse Communication: Mobility status PT Visit Diagnosis: Unsteadiness on feet (R26.81);Other abnormalities of gait and mobility (R26.89);Difficulty in walking, not elsewhere classified (R26.2);Hemiplegia and hemiparesis;Pain Hemiplegia - Right/Left: Left Hemiplegia - dominant/non-dominant: Non-dominant Pain - Right/Left: Left Pain - part of body: Knee     Time: 2763-9432 PT Time Calculation (min) (ACUTE ONLY): 32 min  Charges:  $Gait Training: 8-22 mins $Therapeutic Activity: 8-22 mins                     Marisa Severin, PT, DPT Acute Rehabilitation Services Pager [phone] Office [place] 07/21/2019, 3:52 PM

## 2019-07-22 DIAGNOSIS — M1712 Unilateral primary osteoarthritis, left knee: Secondary | ICD-10-CM | POA: Diagnosis not present

## 2019-07-22 MED ORDER — OXYCODONE HCL 5 MG PO TABS: 5.0000 mg | ORAL_TABLET | ORAL | 0 refills | Status: DC | PRN

## 2019-07-22 MED ORDER — CELECOXIB 200 MG PO CAPS: 200.0000 mg | ORAL_CAPSULE | Freq: Two times a day (BID) | ORAL | 0 refills | Status: DC

## 2019-07-22 MED ORDER — METHOCARBAMOL 500 MG PO TABS: 500.0000 mg | ORAL_TABLET | Freq: Three times a day (TID) | ORAL | 0 refills | Status: DC | PRN

## 2019-07-22 NOTE — Plan of Care (Signed)
  Problem: Education: Goal: Knowledge of General Education information will improve Description: Including pain rating scale, medication(s)/side effects and non-pharmacologic comfort measures Outcome: Progressing   Problem: Pain Managment: Goal: General experience of comfort will improve Outcome: Progressing   Problem: Safety: Goal: Ability to remain free from injury will improve Outcome: Progressing   

## 2019-07-22 NOTE — Plan of Care (Signed)
  Problem: Education: Goal: Knowledge of General Education information will improve Description: Including pain rating scale, medication(s)/side effects and non-pharmacologic comfort measures 07/22/2019 0806 by Melina Schools, RN Outcome: Adequate for Discharge 07/22/2019 0745 by Melina Schools, RN Outcome: Progressing   Problem: Health Behavior/Discharge Planning: Goal: Ability to manage health-related needs will improve Outcome: Adequate for Discharge   Problem: Clinical Measurements: Goal: Ability to maintain clinical measurements within normal limits will improve Outcome: Adequate for Discharge Goal: Will remain free from infection Outcome: Adequate for Discharge Goal: Diagnostic test results will improve Outcome: Adequate for Discharge Goal: Respiratory complications will improve Outcome: Adequate for Discharge Goal: Cardiovascular complication will be avoided Outcome: Adequate for Discharge   Problem: Activity: Goal: Risk for activity intolerance will decrease 07/22/2019 0806 by Melina Schools, RN Outcome: Adequate for Discharge 07/22/2019 0745 by Melina Schools, RN Outcome: Not Progressing   Problem: Nutrition: Goal: Adequate nutrition will be maintained Outcome: Adequate for Discharge   Problem: Coping: Goal: Level of anxiety will decrease Outcome: Adequate for Discharge   Problem: Elimination: Goal: Will not experience complications related to bowel motility Outcome: Adequate for Discharge Goal: Will not experience complications related to urinary retention Outcome: Adequate for Discharge   Problem: Pain Managment: Goal: General experience of comfort will improve 07/22/2019 0806 by Melina Schools, RN Outcome: Adequate for Discharge 07/22/2019 0745 by Melina Schools, RN Outcome: Progressing   Problem: Safety: Goal: Ability to remain free from injury will improve 07/22/2019 0806 by Melina Schools, RN Outcome: Adequate for  Discharge 07/22/2019 0745 by Melina Schools, RN Outcome: Progressing   Problem: Skin Integrity: Goal: Risk for impaired skin integrity will decrease Outcome: Adequate for Discharge   Problem: Acute Rehab PT Goals(only PT should resolve) Goal: Pt Will Go Supine/Side To Sit Outcome: Adequate for Discharge Goal: Pt Will Go Sit To Supine/Side Outcome: Adequate for Discharge Goal: Patient Will Transfer Sit To/From Stand Outcome: Adequate for Discharge Goal: Pt Will Ambulate Outcome: Adequate for Discharge   Problem: Acute Rehab OT Goals (only OT should resolve) Goal: Pt. Will Perform Lower Body Dressing Outcome: Adequate for Discharge Goal: Pt. Will Transfer To Toilet Outcome: Adequate for Discharge Goal: Pt. Will Perform Toileting-Clothing Manipulation Outcome: Adequate for Discharge Goal: Pt. Will Perform Tub/Shower Transfer Outcome: Adequate for Discharge

## 2019-07-22 NOTE — Progress Notes (Signed)
Saline lock removed. Report called to Jamesetta Geralds at [employer]. D/C packet given to [facility]. Out via stretcher.

## 2019-07-22 NOTE — Plan of Care (Signed)
  Problem: Education: Goal: Knowledge of General Education information will improve Description: Including pain rating scale, medication(s)/side effects and non-pharmacologic comfort measures Outcome: Progressing   Problem: Education: Goal: Knowledge of General Education information will improve Description: Including pain rating scale, medication(s)/side effects and non-pharmacologic comfort measures Outcome: Progressing   Problem: Clinical Measurements: Goal: Ability to maintain clinical measurements within normal limits will improve Outcome: Progressing Goal: Respiratory complications will improve Outcome: Progressing

## 2019-07-22 NOTE — Progress Notes (Signed)
Physical Therapy Treatment Patient Details Name: [name] MRN: [id number] DOB: 03-29-1950 Today's Date: 07/22/2019    History of Present Illness Pt is a 69 y.o. F with significant PMH of CVA, seizures, and R knee meniscal tear who presents s/p left knee total arthoplasty 07/17/2019.     PT Comments    Patient progressing well towards PT goals. Improved ambulation distance with Min A and use of RW for support. Continues to exhibit increased knee flexion LLE despite cues and decreased foot clearance reporting LLE feels heavy (likely from prior stroke). Reviewed some there ex while sitting in the chair. Knee AROM 13-75 degrees AROM. Continues to have pain with knee flexion. Eager to get to SNF. Will follow.    Follow Up Recommendations  SNF     Equipment Recommendations  None recommended by PT    Recommendations for Other Services       Precautions / Restrictions Precautions Precautions: Fall;Knee Precaution Booklet Issued: Yes (comment) Restrictions Weight Bearing Restrictions: Yes LLE Weight Bearing: Weight bearing as tolerated    Mobility  Bed Mobility Overal bed mobility: Needs Assistance Bed Mobility: Supine to Sit     Supine to sit: Min assist     General bed mobility comments: assist to bring LLE to EOB, increased time and use of rail.  Transfers Overall transfer level: Needs assistance Equipment used: Rolling walker (2 wheeled) Transfers: Sit to/from Stand Sit to Stand: Min assist         General transfer comment: Assist to power to standing with good demo of hand placement; placing more weight through LLE, increased knee flexion. Transferred to chair post ambulation. Assist with LLE placement to extend upon sitting due to pain with knee flexion.  Ambulation/Gait Ambulation/Gait assistance: Min guard Gait Distance (Feet): 18 Feet Assistive device: Rolling walker (2 wheeled) Gait Pattern/deviations: Step-to pattern;Decreased step length -  right;Decreased step length - left;Decreased stance time - left;Trunk flexed Gait velocity: decreased   General Gait Details: Slow, unsteady gait with increased knee flexion bilaterally, decreased foot clearance as well. Cues for knee extension during stance phase of gait.   Stairs             Wheelchair Mobility    Modified Rankin (Stroke Patients Only)       Balance Overall balance assessment: Needs assistance Sitting-balance support: Feet supported;No upper extremity supported Sitting balance-Leahy Scale: Fair     Standing balance support: During functional activity Standing balance-Leahy Scale: Poor Standing balance comment: Requires UE support and RW                            Cognition Arousal/Alertness: Awake/alert Behavior During Therapy: WFL for tasks assessed/performed Overall Cognitive Status: Within Functional Limits for tasks assessed                                        Exercises Total Joint Exercises Hip ABduction/ADduction: AROM;Left;10 reps;Seated Goniometric ROM: 13 degrees to 75 degrees AROM    General Comments        Pertinent Vitals/Pain Pain Assessment: Faces Faces Pain Scale: Hurts even more Pain Location: L knee with movement Pain Descriptors / Indicators: Operative site guarding;Grimacing;Guarding Pain Intervention(s): Monitored during session;Repositioned;Limited activity within patient's tolerance    Home Living  Prior Function            PT Goals (current goals can now be found in the care plan section) Progress towards PT goals: Progressing toward goals    Frequency    7X/week      PT Plan Current plan remains appropriate    Co-evaluation              AM-PAC PT "6 Clicks" Mobility   Outcome Measure  Help needed turning from your back to your side while in a flat bed without using bedrails?: A Little Help needed moving from lying on your back to  sitting on the side of a flat bed without using bedrails?: A Little Help needed moving to and from a bed to a chair (including a wheelchair)?: A Little Help needed standing up from a chair using your arms (e.g., wheelchair or bedside chair)?: A Little Help needed to walk in hospital room?: A Little Help needed climbing 3-5 steps with a railing? : Total 6 Click Score: 16    End of Session Equipment Utilized During Treatment: Gait belt Activity Tolerance: Patient tolerated treatment well Patient left: in chair;with call bell/phone within reach;with chair alarm set Nurse Communication: Mobility status PT Visit Diagnosis: Unsteadiness on feet (R26.81);Other abnormalities of gait and mobility (R26.89);Difficulty in walking, not elsewhere classified (R26.2);Hemiplegia and hemiparesis;Pain Hemiplegia - Right/Left: Left Hemiplegia - dominant/non-dominant: Non-dominant Pain - Right/Left: Left Pain - part of body: Knee     Time: 1165-7903 PT Time Calculation (min) (ACUTE ONLY): 23 min  Charges:  $Gait Training: 8-22 mins $Therapeutic Activity: 8-22 mins                     Marisa Severin, PT, DPT Acute Rehabilitation Services Pager [phone] Office [place] 07/22/2019, 11:33 AM

## 2019-08-01 ENCOUNTER — Ambulatory Visit (INDEPENDENT_AMBULATORY_CARE_PROVIDER_SITE_OTHER): Payer: Medicare HMO | Admitting: Orthopedic Surgery

## 2019-08-01 ENCOUNTER — Encounter: Payer: Self-pay | Admitting: Orthopedic Surgery

## 2019-08-01 ENCOUNTER — Ambulatory Visit (INDEPENDENT_AMBULATORY_CARE_PROVIDER_SITE_OTHER): Payer: Medicare HMO

## 2019-08-01 DIAGNOSIS — Z96652 Presence of left artificial knee joint: Secondary | ICD-10-CM

## 2019-08-01 NOTE — Progress Notes (Signed)
Post-Op Visit Note   Patient: [name]           Date of Birth: 1950/09/30           MRN: [id number] Visit Date: 08/01/2019 PCP: Nolene Ebbs, MD   Assessment & Plan:  Chief Complaint:  Chief Complaint  Patient presents with  . Left Knee - Routine Post Op   Visit Diagnoses:  1. S/P total knee arthroplasty, left     Plan: Marla is a patient who is now about 2 weeks out left total knee replacement cemented.  Doing reasonably well.  She is at [facility] facility.  She is had a stroke in 2015 which does affect her left side.  She states that she is always weak on that side.  On exam she has excellent flexion to 90 degrees but lacks about 15 to 20 degrees of full extension with soft endpoint.  Incision is intact.  Radiographs look good.  Preop varus deformity has been significantly corrected.  Plan at this time is for her to really work on flexion.  She does have longstanding ankle dorsiflexion weakness consistent with her stroke.  She is given need to be in that facility probably 1-2 more weeks before she goes home and is independent enough.  She really needs to work on full extension so she does not limp as much.  We will check her back in about 2 weeks for clinical recheck primarily on extension.  No calf tenderness today.  Negative Homans  Follow-Up Instructions: No follow-ups on file.   Orders:  Orders Placed This Encounter  Procedures  . XR Knee 1-2 Views Left   No orders of the defined types were placed in this encounter.   Imaging: No results found.  PMFS History: Patient Active Problem List   Diagnosis Date Noted  . Arthritis of left knee 07/17/2019  . Asthma 06/12/2017  . Hyperlipidemia 06/12/2017  . Seizures ([place]) 06/12/2017  . Seizure ([place]) 06/12/2017  . GERD (gastroesophageal reflux disease) 10/31/2016  . Chronic rhinitis 10/31/2016  . Chronic cholecystitis 04/12/2015  . Preoperative clearance 03/17/2015  . Spastic hemiplegia affecting nondominant side  ([place]) 10/27/2013  . Adhesive capsulitis of right shoulder 10/27/2013  . Chronic diastolic heart failure ([place]) 08/26/2013  . Dyslipidemia 08/26/2013  . Borderline diabetes 08/26/2013  . Cerebral infarction ([place]) 08/26/2013  . Acute CVA (cerebrovascular accident) ([place]) 08/23/2013  . Pre-syncope 08/17/2012  . Hypokalemia 08/17/2012  . ARTHRITIS 03/28/2010  . DE QUERVAIN'S TENOSYNOVITIS 03/28/2010  . Normocytic anemia 07/26/2009  . Moderate persistent asthma 03/09/2009  . DENTAL CARIES 03/09/2009  . CANDIDIASIS OF VULVA AND VAGINA 06/04/2008  . SKIN TAG 06/04/2008  . ARTHRITIS, KNEES, BILATERAL 05/22/2008  . HYPERTENSION, BENIGN ESSENTIAL 05/04/2008  . KNEE PAIN, BILATERAL 05/04/2008  . Cough, persistent 05/04/2008  . MENISCUS TEAR, RIGHT 12/07/2005   Past Medical History:  Diagnosis Date  . Arthritis   . Asthma   . Difficulty sleeping   . Gallstones   . History of kidney stones   . Hyperlipidemia   . Hypertension   . Preoperative clearance   . Spasmodic cough 06/2017   "IT COMES IN DIFFERENT SEASONS"  . Stroke ([place]) 2015   WEAKNESS L SIDE OF BODY     Family History  Problem Relation Age of Onset  . Diabetes Mother   . Colon cancer Father 51  . Colon polyps Neg Hx   . Esophageal cancer Neg Hx   . Rectal cancer Neg Hx   .  Stomach cancer Neg Hx     Past Surgical History:  Procedure Laterality Date  . CHOLECYSTECTOMY N/A 04/12/2015   Procedure: LAPAROSCOPIC CHOLECYSTECTOMY WITH INTRAOPERATIVE CHOLANGIOGRAM;  Surgeon: Greer Pickerel, MD;  Location: [facility];  Service: General;  Laterality: N/A;  . gall stones    . KNEE ARTHROSCOPY  2010  . TOTAL KNEE ARTHROPLASTY Left 07/17/2019   Procedure: LEFT TOTAL KNEE ARTHROPLASTY;  Surgeon: Meredith Pel, MD;  Location: [facility];  Service: Orthopedics;  Laterality: Left;  . TUBAL LIGATION     Social History   Occupational History  . Not on file  Tobacco Use  . Smoking status: Former Smoker    Quit date: 04/08/1994    Years since  quitting: 25.3  . Smokeless tobacco: Never Used  Vaping Use  . Vaping Use: Never used  Substance and Sexual Activity  . Alcohol use: No  . Drug use: No  . Sexual activity: Not on file

## 2019-08-19 ENCOUNTER — Telehealth: Payer: Self-pay | Admitting: Orthopedic Surgery

## 2019-08-19 NOTE — Telephone Encounter (Signed)
Estill Bamberg from [email or web address]  requesting verbal orders for skilled nursing to monitor incision of patient for once a week for 2 weeks. [employer] would like a call back to confirm orders at [phone].

## 2019-08-19 NOTE — Telephone Encounter (Signed)
IC verbal given.  

## 2019-08-22 ENCOUNTER — Ambulatory Visit: Payer: Medicare HMO | Admitting: Orthopedic Surgery

## 2019-08-22 DIAGNOSIS — Z96652 Presence of left artificial knee joint: Secondary | ICD-10-CM

## 2019-08-23 ENCOUNTER — Encounter: Payer: Self-pay | Admitting: Orthopedic Surgery

## 2019-08-23 NOTE — Progress Notes (Signed)
Post-Op Visit Note   Patient: [name]           Date of Birth: 15-Nov-1950           MRN: [id number] Visit Date: 08/22/2019 PCP: Nolene Ebbs, MD   Assessment & Plan:  Chief Complaint:  Chief Complaint  Patient presents with  . Left Knee - Routine Post Op   Visit Diagnoses: No diagnosis found.  Plan: [name] is a patient is now about 5 weeks out left total knee replacement.  She is doing well.  Doing home health physical therapy only.  On exam the incision is intact.  She has improving range of motion.  Still lacks about 10 degrees of full extension but has flexion past 90.  Plan is to continue home health PT.  Not really driving yet.  Come back in 4 weeks for final check.  Overall she is happy with the amount of pain relief she has.  Follow-Up Instructions: No follow-ups on file.   Orders:  No orders of the defined types were placed in this encounter.  No orders of the defined types were placed in this encounter.   Imaging: No results found.  PMFS History: Patient Active Problem List   Diagnosis Date Noted  . Arthritis of left knee 07/17/2019  . Asthma 06/12/2017  . Hyperlipidemia 06/12/2017  . Seizures ([place]) 06/12/2017  . Seizure ([place]) 06/12/2017  . GERD (gastroesophageal reflux disease) 10/31/2016  . Chronic rhinitis 10/31/2016  . Chronic cholecystitis 04/12/2015  . Preoperative clearance 03/17/2015  . Spastic hemiplegia affecting nondominant side ([place]) 10/27/2013  . Adhesive capsulitis of right shoulder 10/27/2013  . Chronic diastolic heart failure ([place]) 08/26/2013  . Dyslipidemia 08/26/2013  . Borderline diabetes 08/26/2013  . Cerebral infarction ([place]) 08/26/2013  . Acute CVA (cerebrovascular accident) ([place]) 08/23/2013  . Pre-syncope 08/17/2012  . Hypokalemia 08/17/2012  . ARTHRITIS 03/28/2010  . DE QUERVAIN'S TENOSYNOVITIS 03/28/2010  . Normocytic anemia 07/26/2009  . Moderate persistent asthma 03/09/2009  . DENTAL CARIES 03/09/2009  . CANDIDIASIS  OF VULVA AND VAGINA 06/04/2008  . SKIN TAG 06/04/2008  . ARTHRITIS, KNEES, BILATERAL 05/22/2008  . HYPERTENSION, BENIGN ESSENTIAL 05/04/2008  . KNEE PAIN, BILATERAL 05/04/2008  . Cough, persistent 05/04/2008  . MENISCUS TEAR, RIGHT 12/07/2005   Past Medical History:  Diagnosis Date  . Arthritis   . Asthma   . Difficulty sleeping   . Gallstones   . History of kidney stones   . Hyperlipidemia   . Hypertension   . Preoperative clearance   . Spasmodic cough 06/2017   "IT COMES IN DIFFERENT SEASONS"  . Stroke ([place]) 2015   WEAKNESS L SIDE OF BODY     Family History  Problem Relation Age of Onset  . Diabetes Mother   . Colon cancer Father 74  . Colon polyps Neg Hx   . Esophageal cancer Neg Hx   . Rectal cancer Neg Hx   . Stomach cancer Neg Hx     Past Surgical History:  Procedure Laterality Date  . CHOLECYSTECTOMY N/A 04/12/2015   Procedure: LAPAROSCOPIC CHOLECYSTECTOMY WITH INTRAOPERATIVE CHOLANGIOGRAM;  Surgeon: Greer Pickerel, MD;  Location: [facility];  Service: General;  Laterality: N/A;  . gall stones    . KNEE ARTHROSCOPY  2010  . TOTAL KNEE ARTHROPLASTY Left 07/17/2019   Procedure: LEFT TOTAL KNEE ARTHROPLASTY;  Surgeon: Meredith Pel, MD;  Location: [facility];  Service: Orthopedics;  Laterality: Left;  . TUBAL LIGATION     Social History  Occupational History  . Not on file  Tobacco Use  . Smoking status: Former Smoker    Quit date: 04/08/1994    Years since quitting: 25.3  . Smokeless tobacco: Never Used  Vaping Use  . Vaping Use: Never used  Substance and Sexual Activity  . Alcohol use: No  . Drug use: No  . Sexual activity: Not on file

## 2019-08-26 ENCOUNTER — Telehealth: Payer: Self-pay

## 2019-08-26 NOTE — Telephone Encounter (Signed)
Patient would like a Rx for a muscle relaxer.  Cb# [phone].  Please advise.  Thank you.

## 2019-08-27 NOTE — Telephone Encounter (Signed)
Pls advise.  

## 2019-08-28 ENCOUNTER — Other Ambulatory Visit: Payer: Self-pay | Admitting: Surgical

## 2019-08-28 ENCOUNTER — Telehealth: Payer: Self-pay | Admitting: Orthopedic Surgery

## 2019-08-28 MED ORDER — METHOCARBAMOL 500 MG PO TABS: 500.0000 mg | ORAL_TABLET | Freq: Three times a day (TID) | ORAL | 0 refills | Status: DC | PRN

## 2019-08-28 NOTE — Telephone Encounter (Signed)
Submitted RX robaxin

## 2019-08-28 NOTE — Telephone Encounter (Signed)
Pt would like to see if she can get some muscle relaxers called in?

## 2019-08-28 NOTE — Telephone Encounter (Signed)
See other message. Patient advised done.

## 2019-08-28 NOTE — Telephone Encounter (Signed)
IC advised patient done.

## 2019-09-19 ENCOUNTER — Ambulatory Visit (INDEPENDENT_AMBULATORY_CARE_PROVIDER_SITE_OTHER): Payer: Medicare HMO | Admitting: Orthopedic Surgery

## 2019-09-19 ENCOUNTER — Encounter: Payer: Self-pay | Admitting: Orthopedic Surgery

## 2019-09-19 VITALS — Ht 63.0 in | Wt 221.0 lb

## 2019-09-19 DIAGNOSIS — Z96652 Presence of left artificial knee joint: Secondary | ICD-10-CM

## 2019-09-20 ENCOUNTER — Encounter: Payer: Self-pay | Admitting: Orthopedic Surgery

## 2019-09-20 NOTE — Progress Notes (Signed)
Post-Op Visit Note   Patient: [name]           Date of Birth: [birth year]           MRN: [id number] Visit Date: 09/19/2019 PCP: Nolene Ebbs, MD   Assessment & Plan:  Chief Complaint:  Chief Complaint  Patient presents with  . Left Knee - Follow-up    07/17/2019 Left TKA   Visit Diagnoses:  1. S/P total knee arthroplasty, left     Plan: Patient is a 69 year old female presents s/p left total knee arthroplasty on 07/17/2019.  She is doing well overall.  She is ambulating with a cane.  She takes ibuprofen with good relief.  She states that PT is going well and she has 3 sessions remaining.  On exam she has about 5 of extension with 95 to 100 of flexion.  Her incision has healed well.  She denies any fevers, chills, drainage.  No calf tenderness on exam.  Negative Homans' sign.  No gross instability of the left knee during examination.  Overall, patient is doing well and she is satisfied with the relief that the knee replacement has provided.  Plan to release patient today and she will follow-up as needed.  She does note that she will need another set of dentures in the future and she was encouraged to call the office for prophylactic antibiotics prior to any dental procedures.  She understands and agrees with plan.  Follow-up as needed.  Follow-Up Instructions: No follow-ups on file.   Orders:  No orders of the defined types were placed in this encounter.  No orders of the defined types were placed in this encounter.   Imaging: No results found.  PMFS History: Patient Active Problem List   Diagnosis Date Noted  . Arthritis of left knee 07/17/2019  . Asthma 06/12/2017  . Hyperlipidemia 06/12/2017  . Seizures ([place]) 06/12/2017  . Seizure ([place]) 06/12/2017  . GERD (gastroesophageal reflux disease) 10/31/2016  . Chronic rhinitis 10/31/2016  . Chronic cholecystitis 04/12/2015  . Preoperative clearance 03/17/2015  . Spastic hemiplegia affecting nondominant side ([place])  10/27/2013  . Adhesive capsulitis of right shoulder 10/27/2013  . Chronic diastolic heart failure ([place]) 08/26/2013  . Dyslipidemia 08/26/2013  . Borderline diabetes 08/26/2013  . Cerebral infarction ([place]) 08/26/2013  . Acute CVA (cerebrovascular accident) ([place]) 08/23/2013  . Pre-syncope 08/17/2012  . Hypokalemia 08/17/2012  . ARTHRITIS 03/28/2010  . DE QUERVAIN'S TENOSYNOVITIS 03/28/2010  . Normocytic anemia 07/26/2009  . Moderate persistent asthma 03/09/2009  . DENTAL CARIES 03/09/2009  . CANDIDIASIS OF VULVA AND VAGINA 06/04/2008  . SKIN TAG 06/04/2008  . ARTHRITIS, KNEES, BILATERAL 05/22/2008  . HYPERTENSION, BENIGN ESSENTIAL 05/04/2008  . KNEE PAIN, BILATERAL 05/04/2008  . Cough, persistent 05/04/2008  . MENISCUS TEAR, RIGHT 12/07/2005   Past Medical History:  Diagnosis Date  . Arthritis   . Asthma   . Difficulty sleeping   . Gallstones   . History of kidney stones   . Hyperlipidemia   . Hypertension   . Preoperative clearance   . Spasmodic cough 06/2017   "IT COMES IN DIFFERENT SEASONS"  . Stroke ([place]) 2015   WEAKNESS L SIDE OF BODY     Family History  Problem Relation Age of Onset  . Diabetes Mother   . Colon cancer Father 85  . Colon polyps Neg Hx   . Esophageal cancer Neg Hx   . Rectal cancer Neg Hx   . Stomach cancer Neg Hx  Past Surgical History:  Procedure Laterality Date  . CHOLECYSTECTOMY N/A 04/12/2015   Procedure: LAPAROSCOPIC CHOLECYSTECTOMY WITH INTRAOPERATIVE CHOLANGIOGRAM;  Surgeon: Greer Pickerel, MD;  Location: [facility];  Service: General;  Laterality: N/A;  . gall stones    . KNEE ARTHROSCOPY  2010  . TOTAL KNEE ARTHROPLASTY Left 07/17/2019   Procedure: LEFT TOTAL KNEE ARTHROPLASTY;  Surgeon: Meredith Pel, MD;  Location: [facility];  Service: Orthopedics;  Laterality: Left;  . TUBAL LIGATION     Social History   Occupational History  . Not on file  Tobacco Use  . Smoking status: Former Smoker    Quit date: 04/08/1994    Years since  quitting: 25.4  . Smokeless tobacco: Never Used  Vaping Use  . Vaping Use: Never used  Substance and Sexual Activity  . Alcohol use: No  . Drug use: No  . Sexual activity: Not on file

## 2019-10-03 ENCOUNTER — Telehealth: Payer: Self-pay | Admitting: Orthopedic Surgery

## 2019-10-03 NOTE — Telephone Encounter (Signed)
Is she requesting a muscle relaxer like Robaxin?

## 2019-10-03 NOTE — Telephone Encounter (Signed)
Patient called asked if Dr Marlou Sa will prescribe a medication for her because her left leg shaking. Patient said it's the nerve in her leg that makes her leg shake. Patient said her leg was shaking in rehab and she was prescribe a nerve pill. The number to contact patient is [phone]

## 2019-10-03 NOTE — Telephone Encounter (Signed)
Pls advise.  

## 2019-10-06 NOTE — Telephone Encounter (Signed)
I called patient to ask. No answer. Left voicemail for return call.

## 2019-10-08 ENCOUNTER — Ambulatory Visit: Payer: Medicare HMO | Admitting: Neurology

## 2019-11-19 ENCOUNTER — Ambulatory Visit: Payer: Medicare HMO | Admitting: Orthopedic Surgery

## 2019-12-01 ENCOUNTER — Ambulatory Visit: Payer: Medicare HMO | Admitting: Orthopedic Surgery

## 2020-02-26 ENCOUNTER — Ambulatory Visit (INDEPENDENT_AMBULATORY_CARE_PROVIDER_SITE_OTHER): Payer: Medicare HMO

## 2020-02-26 ENCOUNTER — Other Ambulatory Visit: Payer: Self-pay

## 2020-02-26 ENCOUNTER — Ambulatory Visit: Payer: Medicare HMO | Admitting: Orthopedic Surgery

## 2020-02-26 DIAGNOSIS — M545 Low back pain, unspecified: Secondary | ICD-10-CM

## 2020-02-27 ENCOUNTER — Encounter: Payer: Self-pay | Admitting: Orthopedic Surgery

## 2020-02-27 NOTE — Progress Notes (Signed)
Office Visit Note   Patient: [name]           Date of Birth: 17-Aug-1950           MRN: [id number] Visit Date: 02/26/2020 Requested by: Nolene Ebbs, MD [address] [place],  [place] [address] PCP: Nolene Ebbs, MD  Subjective: Chief Complaint  Patient presents with   Lower Back - Pain    HPI: [name] is a 70 year old patient with back pain.  She has been ambulating with a cane.  Back pain has been going on for several weeks.  Occasional radicular pain.  Most of her pain however is in the lower back region.  Aches after standing for more than 10 minutes.  Denies any radiation down the legs when she is standing.  Symptoms duration is about 2 to 3 months.  Does limit her walking endurance.  Would like to take ibuprofen but was advised against it.  Extension hurts her more than flexion.  She is doing a home exercise program.              ROS: All systems reviewed are negative as they relate to the chief complaint within the history of present illness.  Patient denies  fevers or chills.   Assessment & Plan: Visit Diagnoses:  1. Low back pain, unspecified back pain laterality, unspecified chronicity, unspecified whether sciatica present     Plan: Impression is low back pain with some arthritis noted on radiographs.  I think she may have a component of spinal stenosis but no real radicular leg symptoms at this time.  No weakness on exam.  We discussed MRI scanning which she is hesitant to do because of claustrophobia.  That would be followed by injections which she is also hesitant to do.  She found some exercises online and wants to do those stretching type exercises to see if that will help.  She will follow-up as needed but our neck step would be imaging with MRI scan plus injections with Dr. Ernestina Patches  Follow-Up Instructions: Return if symptoms worsen or fail to improve.   Orders:  Orders Placed This Encounter  Procedures   XR Lumbar Spine 2-3 Views   No orders of the  defined types were placed in this encounter.     Procedures: No procedures performed   Clinical Data: No additional findings.  Objective: Vital Signs: LMP  (LMP Unknown)   Physical Exam:   Constitutional: Patient appears well-developed HEENT:  Head: Normocephalic Eyes:EOM are normal Neck: Normal range of motion Cardiovascular: Normal rate Pulmonary/chest: Effort normal Neurologic: Patient is alert Skin: Skin is warm Psychiatric: Patient has normal mood and affect    Ortho Exam: Ortho exam demonstrates normal gait alignment.  5 out of 5 ankle dorsiflexion plantarflexion quad hamstring strength with palpable pedal pulses.  No definite paresthesias L1 S1 bilaterally.  Hurts a little bit more with extension than flexion.  No trochanteric tenderness is noted no groin pain with internal ex rotation of the leg.  No other masses lymphadenopathy or skin changes noted in that back region.  Reflexes symmetric bilateral patella and Achilles 1+ out of 4.  Specialty Comments:  No specialty comments available.  Imaging: No results found.   PMFS History: Patient Active Problem List   Diagnosis Date Noted   Arthritis of left knee 07/17/2019   Asthma 06/12/2017   Hyperlipidemia 06/12/2017   Seizures ([place]) 06/12/2017   Seizure ([place]) 06/12/2017   GERD (gastroesophageal reflux disease) 10/31/2016   Chronic rhinitis  10/31/2016   Chronic cholecystitis 04/12/2015   Preoperative clearance 03/17/2015   Spastic hemiplegia affecting nondominant side ([place]) 10/27/2013   Adhesive capsulitis of right shoulder 10/27/2013   Chronic diastolic heart failure ([place]) 08/26/2013   Dyslipidemia 08/26/2013   Borderline diabetes 08/26/2013   Cerebral infarction ([place]) 08/26/2013   Acute CVA (cerebrovascular accident) ([place]) 08/23/2013   Pre-syncope 08/17/2012   Hypokalemia 08/17/2012   ARTHRITIS 03/28/2010   DE QUERVAIN'S TENOSYNOVITIS 03/28/2010   Normocytic anemia 07/26/2009    Moderate persistent asthma 03/09/2009   DENTAL CARIES 03/09/2009   CANDIDIASIS OF VULVA AND VAGINA 06/04/2008   SKIN TAG 06/04/2008   ARTHRITIS, KNEES, BILATERAL 05/22/2008   HYPERTENSION, BENIGN ESSENTIAL 05/04/2008   KNEE PAIN, BILATERAL 05/04/2008   Cough, persistent 05/04/2008   MENISCUS TEAR, RIGHT 12/07/2005   Past Medical History:  Diagnosis Date   Arthritis    Asthma    Difficulty sleeping    Gallstones    History of kidney stones    Hyperlipidemia    Hypertension    Preoperative clearance    Spasmodic cough 06/2017   "IT COMES IN DIFFERENT SEASONS"   Stroke ([place]) 2015   WEAKNESS L SIDE OF BODY     Family History  Problem Relation Age of Onset   Diabetes Mother    Colon cancer Father 63   Colon polyps Neg Hx    Esophageal cancer Neg Hx    Rectal cancer Neg Hx    Stomach cancer Neg Hx     Past Surgical History:  Procedure Laterality Date   CHOLECYSTECTOMY N/A 04/12/2015   Procedure: LAPAROSCOPIC CHOLECYSTECTOMY WITH INTRAOPERATIVE CHOLANGIOGRAM;  Surgeon: Greer Pickerel, MD;  Location: [facility];  Service: General;  Laterality: N/A;   gall stones     KNEE ARTHROSCOPY  2010   TOTAL KNEE ARTHROPLASTY Left 07/17/2019   Procedure: LEFT TOTAL KNEE ARTHROPLASTY;  Surgeon: Meredith Pel, MD;  Location: [facility];  Service: Orthopedics;  Laterality: Left;   TUBAL LIGATION     Social History   Occupational History   Not on file  Tobacco Use   Smoking status: Former Smoker    Quit date: 04/08/1994    Years since quitting: 25.9   Smokeless tobacco: Never Used  Vaping Use   Vaping Use: Never used  Substance and Sexual Activity   Alcohol use: No   Drug use: No   Sexual activity: Not on file

## 2020-03-24 ENCOUNTER — Encounter (HOSPITAL_COMMUNITY): Payer: Self-pay | Admitting: Emergency Medicine

## 2020-03-24 ENCOUNTER — Emergency Department (HOSPITAL_COMMUNITY)
Admission: EM | Admit: 2020-03-24 | Discharge: 2020-03-25 | Disposition: A | Discharge status: Home / Self Care | Payer: Medicare HMO | Attending: Emergency Medicine | Admitting: Emergency Medicine

## 2020-03-24 DIAGNOSIS — I5032 Chronic diastolic (congestive) heart failure: Secondary | ICD-10-CM | POA: Insufficient documentation

## 2020-03-24 DIAGNOSIS — Z87891 Personal history of nicotine dependence: Secondary | ICD-10-CM | POA: Insufficient documentation

## 2020-03-24 DIAGNOSIS — J45909 Unspecified asthma, uncomplicated: Secondary | ICD-10-CM | POA: Diagnosis not present

## 2020-03-24 DIAGNOSIS — N281 Cyst of kidney, acquired: Secondary | ICD-10-CM | POA: Insufficient documentation

## 2020-03-24 DIAGNOSIS — Z79899 Other long term (current) drug therapy: Secondary | ICD-10-CM | POA: Diagnosis not present

## 2020-03-24 DIAGNOSIS — R9389 Abnormal findings on diagnostic imaging of other specified body structures: Secondary | ICD-10-CM | POA: Insufficient documentation

## 2020-03-24 DIAGNOSIS — R1031 Right lower quadrant pain: Secondary | ICD-10-CM | POA: Diagnosis not present

## 2020-03-24 DIAGNOSIS — I69354 Hemiplegia and hemiparesis following cerebral infarction affecting left non-dominant side: Secondary | ICD-10-CM | POA: Diagnosis not present

## 2020-03-24 DIAGNOSIS — R109 Unspecified abdominal pain: Secondary | ICD-10-CM

## 2020-03-24 DIAGNOSIS — I110 Hypertensive heart disease with heart failure: Secondary | ICD-10-CM | POA: Diagnosis not present

## 2020-03-24 LAB — COMPREHENSIVE METABOLIC PANEL
ALT: 13 U/L (ref 0–44)
AST: 12 U/L — ABNORMAL LOW (ref 15–41)
Albumin: 3.4 g/dL — ABNORMAL LOW (ref 3.5–5.0)
Alkaline Phosphatase: 62 U/L (ref 38–126)
Anion gap: 9 (ref 5–15)
BUN: 9 mg/dL (ref 8–23)
CO2: 27 mmol/L (ref 22–32)
Calcium: 8.8 mg/dL — ABNORMAL LOW (ref 8.9–10.3)
Chloride: 103 mmol/L (ref 98–111)
Creatinine, Ser: 0.78 mg/dL (ref 0.44–1.00)
GFR, Estimated: >60 mL/min (ref >60)
Glucose, Bld: 112 mg/dL — ABNORMAL HIGH (ref 70–99)
Potassium: 3.4 mmol/L — ABNORMAL LOW (ref 3.5–5.1)
Sodium: 139 mmol/L (ref 135–145)
Total Bilirubin: 1.1 mg/dL (ref 0.3–1.2)
Total Protein: 7.3 g/dL (ref 6.5–8.1)

## 2020-03-24 LAB — LIPASE, BLOOD: Lipase: 27 U/L (ref 11–51)

## 2020-03-24 LAB — CBC
HCT: 36.7 % (ref 36.0–46.0)
Hemoglobin: 11.4 g/dL — ABNORMAL LOW (ref 12.0–15.0)
MCH: 27.6 pg (ref 26.0–34.0)
MCHC: 31.1 g/dL (ref 30.0–36.0)
MCV: 88.9 fL (ref 80.0–100.0)
Platelets: 265 10*3/uL (ref 150–400)
RBC: 4.13 MIL/uL (ref 3.87–5.11)
RDW: 14.9 % (ref 11.5–15.5)
WBC: 6.5 10*3/uL (ref 4.0–10.5)
nRBC: 0 % (ref 0.0–0.2)

## 2020-03-24 NOTE — ED Triage Notes (Incomplete)
Pt arrives from home via [facility] from home with chief complaint of abd pain that began suddenly and has two episodes when episodes come it takes her breath away. Currently has no pain.

## 2020-03-25 ENCOUNTER — Emergency Department (HOSPITAL_COMMUNITY): Payer: Medicare HMO

## 2020-03-25 LAB — TROPONIN I (HIGH SENSITIVITY): Troponin I (High Sensitivity): 7 ng/L (ref <18)

## 2020-03-25 MED ORDER — IOHEXOL 300 MG/ML  SOLN
100.0000 mL | Freq: Once | INTRAMUSCULAR | Status: AC | PRN
  Administered 2020-03-25: 100 mL via INTRAVENOUS

## 2020-03-25 NOTE — ED Notes (Signed)
Pt being placed on monitor and in gown. States pain is "better than it was at home"

## 2020-03-25 NOTE — ED Notes (Signed)
PA at bedside.

## 2020-03-25 NOTE — ED Notes (Signed)
Patient transported to CT via stretcher in stable condition 

## 2020-03-25 NOTE — Discharge Instructions (Signed)
Your evaluation today has overall been reassuring and I am glad that you have not had any recurrence of this pain.  Your CT scan does show incidental findings of a 5.6 cm cyst over your left kidney as well as some thickening of your uterine lining.  Please follow-up with your primary care doctor and gynecologist regarding these findings.  Suspect your pain may be due to muscle spasm, but if you have persistent, new or worsening abdominal pain you should return for reevaluation.

## 2020-03-25 NOTE — ED Provider Notes (Signed)
[facility]ERGENCY DEPARTMENT Provider Note   CSN: [id number] Arrival date & time: 03/24/20  1615     History Chief Complaint  Patient presents with  . Abdominal Pain    [name] is a 70 y.o. female.  [name] is a 70 y.o. female with a history of hypertension, hyperlipidemia, spasmodic cough, gallstones, asthma, who presents to the ED for evaluation of abdominal pain.  Patient reports that yesterday afternoon she was sitting at home watching TV, when she coughed and developed sharp pains across her abdomen.  She reports it felt like a tight cramp and like she had knots around her abdomen.  She reports that this pain lasted about 5 minutes and with rubbing her abdomen and sides it seemed to ease off, but then she had another episode of similar severe pain.  She reports she has had some similar pains in the past with coughing or certain movements where it feels like something catches and she gets a sharp cramping pain and feels like she has a knot but these have always been much more brief and quickly go away after a few seconds, but this pain lasted longer which concerned her.  She reports she is only had 2 episodes of this pain and has not had any since arriving here in the hospital, denies any current abdominal pain.  Denies any nausea or vomiting but reports when pain was severe she felt a bit lightheaded but did not pass out.  Denies associated chest pain.  Does report that was severe pain she felt a bit short of breath.  Reports having normal bowel movements and passing gas.  No urinary symptoms.  She has had a previous cholecystectomy and tubal ligation, but no other intra-abdominal surgeries.  No medications prior to arrival.        Past Medical History:  Diagnosis Date  . Arthritis   . Asthma   . Difficulty sleeping   . Gallstones   . History of kidney stones   . Hyperlipidemia   . Hypertension   . Preoperative clearance   . Spasmodic cough  06/2017   "IT COMES IN DIFFERENT SEASONS"  . Stroke ([place]) 2015   WEAKNESS L SIDE OF BODY     Patient Active Problem List   Diagnosis Date Noted  . Arthritis of left knee 07/17/2019  . Asthma 06/12/2017  . Hyperlipidemia 06/12/2017  . Seizures ([place]) 06/12/2017  . Seizure ([place]) 06/12/2017  . GERD (gastroesophageal reflux disease) 10/31/2016  . Chronic rhinitis 10/31/2016  . Chronic cholecystitis 04/12/2015  . Preoperative clearance 03/17/2015  . Spastic hemiplegia affecting nondominant side ([place]) 10/27/2013  . Adhesive capsulitis of right shoulder 10/27/2013  . Chronic diastolic heart failure ([place]) 08/26/2013  . Dyslipidemia 08/26/2013  . Borderline diabetes 08/26/2013  . Cerebral infarction ([place]) 08/26/2013  . Acute CVA (cerebrovascular accident) ([place]) 08/23/2013  . Pre-syncope 08/17/2012  . Hypokalemia 08/17/2012  . ARTHRITIS 03/28/2010  . DE QUERVAIN'S TENOSYNOVITIS 03/28/2010  . Normocytic anemia 07/26/2009  . Moderate persistent asthma 03/09/2009  . DENTAL CARIES 03/09/2009  . CANDIDIASIS OF VULVA AND VAGINA 06/04/2008  . SKIN TAG 06/04/2008  . ARTHRITIS, KNEES, BILATERAL 05/22/2008  . HYPERTENSION, BENIGN ESSENTIAL 05/04/2008  . KNEE PAIN, BILATERAL 05/04/2008  . Cough, persistent 05/04/2008  . MENISCUS TEAR, RIGHT 12/07/2005    Past Surgical History:  Procedure Laterality Date  . CHOLECYSTECTOMY N/A 04/12/2015   Procedure: LAPAROSCOPIC CHOLECYSTECTOMY WITH INTRAOPERATIVE CHOLANGIOGRAM;  Surgeon: Greer Pickerel, MD;  Location: [facility];  Service: General;  Laterality: N/A;  . gall stones    . KNEE ARTHROSCOPY  2010  . TOTAL KNEE ARTHROPLASTY Left 07/17/2019   Procedure: LEFT TOTAL KNEE ARTHROPLASTY;  Surgeon: Meredith Pel, MD;  Location: [facility];  Service: Orthopedics;  Laterality: Left;  . TUBAL LIGATION       OB History   No obstetric history on file.     Family History  Problem Relation Age of Onset  . Diabetes Mother   . Colon cancer Father 41  . Colon  polyps Neg Hx   . Esophageal cancer Neg Hx   . Rectal cancer Neg Hx   . Stomach cancer Neg Hx     Social History   Tobacco Use  . Smoking status: Former Smoker    Quit date: 04/08/1994    Years since quitting: 25.9  . Smokeless tobacco: Never Used  Vaping Use  . Vaping Use: Never used  Substance Use Topics  . Alcohol use: No  . Drug use: No    Home Medications Prior to Admission medications   Medication Sig Start Date End Date Taking? Authorizing Provider  albuterol (ACCUNEB) 1.25 MG/3ML nebulizer solution Take 1 ampule by nebulization in the morning and at bedtime.    [provider]  amLODipine (NORVASC) 5 MG tablet Take 5 mg by mouth daily.  03/17/18   [provider]  aspirin EC 81 MG tablet Take 81 mg by mouth daily.    [provider]  atorvastatin (LIPITOR) 40 MG tablet Take 1 tablet (40 mg total) by mouth daily at 6 PM. 09/18/13   Angiulli, Lavon Paganini, PA-C  celecoxib (CELEBREX) 200 MG capsule Take 1 capsule (200 mg total) by mouth 2 (two) times daily. 07/22/19   Magnant, Gerrianne Scale, PA-C  meclizine (ANTIVERT) 25 MG tablet Take 25 mg by mouth 3 (three) times daily as needed. 04/28/19   [provider]  methocarbamol (ROBAXIN) 500 MG tablet Take 1 tablet (500 mg total) by mouth every 8 (eight) hours as needed. Patient not taking: Reported on 09/19/2019 08/28/19   Donella Stade, PA-C  Nebulizer MISC by Does not apply route.    [provider]  oxyCODONE (OXY IR/ROXICODONE) 5 MG immediate release tablet Take 1 tablet (5 mg total) by mouth every 4 (four) hours as needed for moderate pain (pain score 4-6). Patient not taking: Reported on 09/19/2019 07/22/19   Donella Stade, PA-C  Respiratory Therapy Supplies (FLUTTER) DEVI Use as directed 10/31/16   Bobbitt, Sedalia Muta, MD  Vitamin D, Ergocalciferol, (DRISDOL) 1.25 MG (50000 UNIT) CAPS capsule Take 50,000 Units by mouth once a week. 04/24/19   [provider]    Allergies     Patient has no known allergies.  Review of Systems   Review of Systems  Constitutional: Negative for chills and fever.  HENT: Negative.   Respiratory: Negative for cough and shortness of breath.   Cardiovascular: Negative for chest pain.  Gastrointestinal: Positive for abdominal pain. Negative for constipation, diarrhea, nausea and vomiting.  Genitourinary: Negative for dysuria.  Musculoskeletal: Negative for arthralgias and myalgias.  Skin: Negative for color change and rash.  Neurological: Negative for dizziness, syncope and light-headedness.  All other systems reviewed and are negative.   Physical Exam Updated Vital Signs BP (!) 141/78 (BP Location: Left Arm)   Pulse 95   Temp 98.3 F (36.8 C)   Resp 17   LMP  (LMP Unknown)   SpO2 97%  Physical Exam Vitals and nursing note reviewed.  Constitutional:      General: She is not in acute distress.    Appearance: She is well-developed and well-nourished. She is not diaphoretic.     Comments: Very pleasant elderly female, alert and in no acute distress  HENT:     Head: Normocephalic and atraumatic.     Mouth/Throat:     Mouth: Oropharynx is clear and moist. Mucous membranes are moist.     Pharynx: Oropharynx is clear.  Eyes:     General:        Right eye: No discharge.        Left eye: No discharge.     Extraocular Movements: EOM normal.     Pupils: Pupils are equal, round, and reactive to light.  Cardiovascular:     Rate and Rhythm: Normal rate and regular rhythm.     Pulses: Intact distal pulses.     Heart sounds: Normal heart sounds. No murmur heard. No friction rub. No gallop.   Pulmonary:     Effort: Pulmonary effort is normal. No respiratory distress.     Breath sounds: Normal breath sounds. No wheezing or rales.     Comments: Respirations equal and unlabored, patient able to speak in full sentences, lungs clear to auscultation bilaterally  Abdominal:     General: Abdomen is protuberant. Bowel sounds are  normal. There is no distension.     Palpations: Abdomen is soft. There is no mass.     Tenderness: There is no abdominal tenderness. There is no guarding.     Hernia: No hernia is present.     Comments: Abdomen is protuberant but soft without distention, bowel sounds present in all quadrants, patient without any focal abdominal tenderness, she does report some mild soreness over the right side of the abdomen where she previously had severe cramp.  No guarding or peritoneal signs.  No CVA tenderness bilaterally.  No noted hernias or masses.  Musculoskeletal:        General: No deformity or edema.     Cervical back: Neck supple.  Skin:    General: Skin is warm and dry.     Capillary Refill: Capillary refill takes less than 2 seconds.  Neurological:     Mental Status: She is alert and oriented to person, place, and time.     Coordination: Coordination normal.     Comments: Speech is clear, able to follow commands Moves extremities without ataxia, coordination intact  Psychiatric:        Mood and Affect: Mood normal.        Behavior: Behavior normal.     ED Results / Procedures / Treatments   Labs (all labs ordered are listed, but only abnormal results are displayed) Labs Reviewed  COMPREHENSIVE METABOLIC PANEL - Abnormal; Notable for the following components:      Result Value   Potassium 3.4 (*)    Glucose, Bld 112 (*)    Calcium 8.8 (*)    Albumin 3.4 (*)    AST 12 (*)    All other components within normal limits  CBC - Abnormal; Notable for the following components:   Hemoglobin 11.4 (*)    All other components within normal limits  LIPASE, BLOOD  TROPONIN I (HIGH SENSITIVITY)    EKG EKG Interpretation  Date/Time:  Thursday March 25 2020 04:16:19 EST Ventricular Rate:  88 PR Interval:    QRS Duration: 88 QT Interval:  367 QTC Calculation: 444 R  Axis:   45 Text Interpretation: Sinus rhythm Abnormal R-wave progression, early transition Probable LVH with secondary  repol abnrm No acute changes Confirmed by Addison Lank [phone]) on 03/25/2020 5:25:11 AM   Radiology CT ABDOMEN PELVIS W CONTRAST  Result Date: 03/25/2020 CLINICAL DATA:  Acute nonlocalized abdominal pain EXAM: CT ABDOMEN AND PELVIS WITH CONTRAST TECHNIQUE: Multidetector CT imaging of the abdomen and pelvis was performed using the standard protocol following bolus administration of intravenous contrast. CONTRAST:  144mL OMNIPAQUE IOHEXOL 300 MG/ML  SOLN COMPARISON:  None. FINDINGS: Lower chest: Subpleural opacity with volume loss in the right lower lobe with similar pattern to 2017 and most consistent with scarring. Hepatobiliary: No focal liver abnormality.Cholecystectomy. No bile duct dilatation. Pancreas: Unremarkable. Spleen: Unremarkable. Adrenals/Urinary Tract: Negative adrenals. No hydronephrosis or stone. 5.6 cm left renal cyst.unremarkable bladder. Stomach/Bowel:  No obstruction. No appendicitis. Vascular/Lymphatic: No acute vascular abnormality. Atheromatous calcification of the aorta. No mass or adenopathy. Reproductive:5 mm endometrial stripe with homogeneous CT appearance. Other: No ascites or pneumoperitoneum. Musculoskeletal: No acute abnormalities. Generalized facet and endplate spurring. IMPRESSION: 1. No acute finding. 2. 5 mm endometrial stripe, please correlate for abnormal uterine bleeding. 3.  Aortic Atherosclerosis (ICD10-I70.0). Electronically Signed   By: Monte Fantasia M.D.   On: 03/25/2020 06:27    Procedures Procedures   Medications Ordered in ED Medications  iohexol (OMNIPAQUE) 300 MG/ML solution 100 mL (100 mLs Intravenous Contrast Given 03/25/20 0608)    ED Course  I have reviewed the triage vital signs and the nursing notes.  Pertinent labs & imaging results that were available during my care of the patient were reviewed by me and considered in my medical decision making (see chart for details).    MDM Rules/Calculators/A&P                         Patient  presents to the ED with complaints of abdominal pain.  Reports severe abdominal cramping sensation that lasted about 5 minutes and then resolved, had 1 similar episode, has had previous pains that have been much more brief.  Patient nontoxic appearing, in no apparent distress, vitals WNL. On exam patient without any current tenderness, no peritoneal signs. Will evaluate with labs and CT abdomen pelvis. Analgesics, anti-emetics, and fluids administered.   I have independently ordered, reviewed and interpreted all labs and imaging: CBC: No leukocytosis, stable hemoglobin CMP: Glucose of 112, potassium of 3.4, no other significant electrolyte derangements, normal renal and liver function Lipase: WNL  troponin: Negative EKG: Sinus rhythm with no acute changes when compared to prior  CT abdomen pelvis with no acute findings, incidental findings of 5.6 cm left renal cyst and 5 mm endometrial stripe, patient not having any current vaginal bleeding, will need to have these incidental findings followed up with PCP and OB/GYN.  On repeat abdominal exam patient remains without tenderness or peritoneal signs, doubt cholecystitis, pancreatitis, diverticulitis, appendicitis, bowel obstruction/perforation.  Pain seems to come on with movement or coughing, may be musculoskeletal in nature.  Patient tolerating PO in the emergency department. Will discharge home with supportive measures. I discussed results, treatment plan, need for PCP follow-up, and return precautions with the patient. Provided opportunity for questions, patient confirmed understanding and is in agreement with plan.   Final Clinical Impression(s) / ED Diagnoses Final diagnoses:  Abdominal cramping  Increased endometrial stripe  Renal cyst    Rx / DC Orders ED Discharge Orders    None  Jacqlyn Larsen, PA-C 03/25/20 5825    Fatima Blank, MD 03/25/20 [phone]

## 2020-04-01 ENCOUNTER — Telehealth: Payer: Self-pay | Admitting: Orthopedic Surgery

## 2020-04-01 NOTE — Telephone Encounter (Signed)
I spoke with patient She has not tried ant-inflammatories. She said that she was able to take them and she will try taking some and if she is not getting better will call us back next week

## 2020-04-01 NOTE — Telephone Encounter (Signed)
Pt would like to know if she can get something for her right knee pain.

## 2020-04-16 ENCOUNTER — Ambulatory Visit (INDEPENDENT_AMBULATORY_CARE_PROVIDER_SITE_OTHER): Payer: Medicare HMO

## 2020-04-16 ENCOUNTER — Other Ambulatory Visit: Payer: Self-pay

## 2020-04-16 ENCOUNTER — Ambulatory Visit: Payer: Medicare HMO | Admitting: Surgical

## 2020-04-16 ENCOUNTER — Telehealth: Payer: Self-pay

## 2020-04-16 ENCOUNTER — Encounter: Payer: Self-pay | Admitting: Surgical

## 2020-04-16 DIAGNOSIS — G8929 Other chronic pain: Secondary | ICD-10-CM

## 2020-04-16 DIAGNOSIS — M25571 Pain in right ankle and joints of right foot: Secondary | ICD-10-CM

## 2020-04-16 DIAGNOSIS — M170 Bilateral primary osteoarthritis of knee: Secondary | ICD-10-CM | POA: Diagnosis not present

## 2020-04-16 DIAGNOSIS — M25561 Pain in right knee: Secondary | ICD-10-CM

## 2020-04-16 NOTE — Telephone Encounter (Signed)
Please get auth for right knee gel injection-dr. Dean pt 

## 2020-04-17 ENCOUNTER — Encounter: Payer: Self-pay | Admitting: Surgical

## 2020-04-17 NOTE — Progress Notes (Signed)
Office Visit Note   Patient: [name]           Date of Birth: 1950-03-23           MRN: [id number] Visit Date: 04/16/2020 Requested by: Nolene Ebbs, MD [address] [place],  [place] [address] PCP: Nolene Ebbs, MD  Subjective: Chief Complaint  Patient presents with   Right Knee - Pain    HPI: [name] is a 70 y.o. female who presents to the office complaining of right knee and right ankle pain.  Patient denies any new injuries.  She reports pain that slowly came on over the last 2 weeks.  Most of her pain is in the medial aspect of the right knee.  She is taking ibuprofen with some relief.  Pain radiates from her knee down to her ankle.  Denies any recent illness, fevers, chills, night sweats, malaise.  She is having difficulty walking on the knee due to pain.  Her left knee is doing very well.  Last gel injection on 04/03/2019 in the right knee provided significant relief.  She does have history of right knee osteoarthritis.  Denies any history of gout..                ROS: All systems reviewed are negative as they relate to the chief complaint within the history of present illness.  Patient denies fevers or chills.  Assessment & Plan: Visit Diagnoses:  1. Pain in right ankle and joints of right foot   2. Chronic pain of right knee     Plan: Patient is a 70 year old female who presents complaining of right knee pain.  She has history of right knee osteoarthritis.  She has had increasing pain over the last 2 weeks to the point where she is having some difficulty walking due to pain.  Denies any injury or any constitutional symptoms of infection.  No history of gout.  Small effusion on exam today.  This effusion was aspirated with about 10 cc of a mixture of inflammatory synovial fluid and blood.  Cortisone injection was administered and patient tolerated the procedure well.  Plan to preapproved patient for gel injections as these provided significant relief last time.   Radiographs taken today show mild ankle arthritis with severe right knee arthritis, worst in the medial compartment.  This patient is diagnosed with osteoarthritis of the knee(s).    Radiographs show evidence of joint space narrowing, osteophytes, subchondral sclerosis and/or subchondral cysts.  This patient has knee pain which interferes with functional and activities of daily living.    This patient has experienced inadequate response, adverse effects and/or intolerance with conservative treatments such as acetaminophen, NSAIDS, topical creams, physical therapy or regular exercise, knee bracing and/or weight loss.   This patient has experienced inadequate response or has a contraindication to intra articular steroid injections for at least 3 months.   This patient is not scheduled to have a total knee replacement within 6 months of starting treatment with viscosupplementation.   Follow-Up Instructions: No follow-ups on file.   Orders:  Orders Placed This Encounter  Procedures   XR KNEE 3 VIEW RIGHT   XR Ankle Complete Right   No orders of the defined types were placed in this encounter.     Procedures: Large Joint Inj: R knee on 04/16/2020 3:00 PM Indications: diagnostic evaluation, joint swelling and pain Details: 18 G 1.5 in needle, superolateral approach  Arthrogram: No  Medications: 5 mL lidocaine 1 %;  40 mg methylPREDNISolone acetate 40 MG/ML; 4 mL bupivacaine 0.25 % Outcome: tolerated well, no immediate complications Procedure, treatment alternatives, risks and benefits explained, specific risks discussed. Consent was given by the patient. Immediately prior to procedure a time out was called to verify the correct patient, procedure, equipment, support staff and site/side marked as required. Patient was prepped and draped in the usual sterile fashion.       Clinical Data: No additional findings.  Objective: Vital Signs: LMP  (LMP Unknown)   Physical Exam:   Constitutional: Patient appears well-developed HEENT:  Head: Normocephalic Eyes:EOM are normal Neck: Normal range of motion Cardiovascular: Normal rate Pulmonary/chest: Effort normal Neurologic: Patient is alert Skin: Skin is warm Psychiatric: Patient has normal mood and affect  Ortho Exam: Ortho exam demonstrates right knee with extension to 10 degrees and flexion to 100 degrees.  Tenderness particularly over the medial joint line.  Tenderness over the lateral joint line.  No tenderness over the quadricep tendon, patellar tendon.  She is able to perform straight leg raise.  No pain with hip range of motion.  Small effusion present.  No significantly increased warmth of the right knee.  Specialty Comments:  No specialty comments available.  Imaging: XR Ankle Complete Right  Result Date: 04/16/2020 AP, oblique, lateral views of right ankle reviewed.  Mild degenerative changes noted of the tibiotalar joint.  Mild to moderate degenerative changes noted throughout the right midfoot.  No fracture, dislocation, subluxation.  XR KNEE 3 VIEW RIGHT  Result Date: 04/16/2020 AP, lateral, sunrise views right knee reviewed.  Severe osteoarthritis of the right knee with severe loss of joint space, osteophyte formation, sclerosis of the medial compartment primarily.  Mild degenerative changes of the lateral compartment.  Severe degenerative changes of the patellofemoral compartment.  No fracture or dislocation noted.    PMFS History: Patient Active Problem List   Diagnosis Date Noted   Arthritis of left knee 07/17/2019   Asthma 06/12/2017   Hyperlipidemia 06/12/2017   Seizures ([place]) 06/12/2017   Seizure ([place]) 06/12/2017   GERD (gastroesophageal reflux disease) 10/31/2016   Chronic rhinitis 10/31/2016   Chronic cholecystitis 04/12/2015   Preoperative clearance 03/17/2015   Spastic hemiplegia affecting nondominant side ([place]) 10/27/2013   Adhesive capsulitis of right shoulder  10/27/2013   Chronic diastolic heart failure ([place]) 08/26/2013   Dyslipidemia 08/26/2013   Borderline diabetes 08/26/2013   Cerebral infarction ([place]) 08/26/2013   Acute CVA (cerebrovascular accident) ([place]) 08/23/2013   Pre-syncope 08/17/2012   Hypokalemia 08/17/2012   ARTHRITIS 03/28/2010   DE QUERVAIN'S TENOSYNOVITIS 03/28/2010   Normocytic anemia 07/26/2009   Moderate persistent asthma 03/09/2009   DENTAL CARIES 03/09/2009   CANDIDIASIS OF VULVA AND VAGINA 06/04/2008   SKIN TAG 06/04/2008   ARTHRITIS, KNEES, BILATERAL 05/22/2008   HYPERTENSION, BENIGN ESSENTIAL 05/04/2008   KNEE PAIN, BILATERAL 05/04/2008   Cough, persistent 05/04/2008   MENISCUS TEAR, RIGHT 12/07/2005   Past Medical History:  Diagnosis Date   Arthritis    Asthma    Difficulty sleeping    Gallstones    History of kidney stones    Hyperlipidemia    Hypertension    Preoperative clearance    Spasmodic cough 06/2017   "IT COMES IN DIFFERENT SEASONS"   Stroke ([facility]) 2015   WEAKNESS L SIDE OF BODY     Family History  Problem Relation Age of Onset   Diabetes Mother    Colon cancer Father 24   Colon polyps Neg Hx    Esophageal cancer  Neg Hx    Rectal cancer Neg Hx    Stomach cancer Neg Hx     Past Surgical History:  Procedure Laterality Date   CHOLECYSTECTOMY N/A 04/12/2015   Procedure: LAPAROSCOPIC CHOLECYSTECTOMY WITH INTRAOPERATIVE CHOLANGIOGRAM;  Surgeon: Greer Pickerel, MD;  Location: [facility];  Service: General;  Laterality: N/A;   gall stones     KNEE ARTHROSCOPY  2010   TOTAL KNEE ARTHROPLASTY Left 07/17/2019   Procedure: LEFT TOTAL KNEE ARTHROPLASTY;  Surgeon: Meredith Pel, MD;  Location: [facility];  Service: Orthopedics;  Laterality: Left;   TUBAL LIGATION     Social History   Occupational History   Not on file  Tobacco Use   Smoking status: Former Smoker    Quit date: 04/08/1994    Years since quitting: 26.0   Smokeless tobacco: Never Used  Vaping  Use   Vaping Use: Never used  Substance and Sexual Activity   Alcohol use: No   Drug use: No   Sexual activity: Not on file

## 2020-04-18 MED ORDER — LIDOCAINE HCL 1 % IJ SOLN
5.0000 mL | INTRAMUSCULAR | Status: AC | PRN
  Administered 2020-04-16: 5 mL

## 2020-04-18 MED ORDER — METHYLPREDNISOLONE ACETATE 40 MG/ML IJ SUSP
40.0000 mg | INTRAMUSCULAR | Status: AC | PRN
Start: 2020-04-16 — End: 2020-04-16
  Administered 2020-04-16: 40 mg via INTRA_ARTICULAR

## 2020-04-18 MED ORDER — BUPIVACAINE HCL 0.25 % IJ SOLN
4.0000 mL | INTRAMUSCULAR | Status: AC | PRN
  Administered 2020-04-16: 4 mL via INTRA_ARTICULAR

## 2020-04-19 ENCOUNTER — Ambulatory Visit (INDEPENDENT_AMBULATORY_CARE_PROVIDER_SITE_OTHER): Payer: Medicare HMO | Admitting: Obstetrics & Gynecology

## 2020-04-19 ENCOUNTER — Encounter: Payer: Self-pay | Admitting: Obstetrics & Gynecology

## 2020-04-19 ENCOUNTER — Other Ambulatory Visit: Payer: Self-pay

## 2020-04-19 VITALS — BP 139/80 | HR 82 | Ht 63.0 in | Wt 240.1 lb

## 2020-04-19 DIAGNOSIS — R9389 Abnormal findings on diagnostic imaging of other specified body structures: Secondary | ICD-10-CM | POA: Diagnosis not present

## 2020-04-19 NOTE — Telephone Encounter (Signed)
Noted  

## 2020-04-19 NOTE — Patient Instructions (Signed)
https://www.womenshealth.gov/menopause/menopause-basics"> https://www.clinicalkey.com">  Menopause Menopause is the normal time of a woman's life when menstrual periods stop completely. It marks the natural end to a woman's ability to become pregnant. It can be defined as the absence of a menstrual period for 12 months without another medical cause. The transition to menopause (perimenopause) most often happens between the ages of 45 and 55, and can last for many years. During perimenopause, hormone levels change in your body, which can cause symptoms and affect your health. Menopause may increase your risk for:  Weakened bones (osteoporosis), which causes fractures.  Depression.  Hardening and narrowing of the arteries (atherosclerosis), which can cause heart attacks and strokes. What are the causes? This condition is usually caused by a natural change in hormone levels that happens as you get older. The condition may also be caused by changes that are not natural, including:  Surgery to remove both ovaries (surgical menopause).  Side effects from some medicines, such as chemotherapy used to treat cancer (chemical menopause). What increases the risk? This condition is more likely to start at an earlier age if you have certain medical conditions or have undergone treatments, including:  A tumor of the pituitary gland in the brain.  A disease that affects the ovaries and hormones.  Certain cancer treatments, such as chemotherapy or hormone therapy, or radiation therapy on the pelvis.  Heavy smoking and excessive alcohol use.  Family history of early menopause. This condition is also more likely to develop earlier in women who are very thin. What are the signs or symptoms? Symptoms of this condition include:  Hot flashes.  Irregular menstrual periods.  Night sweats.  Changes in feelings about sex. This could be a decrease in sex drive or an increased discomfort around your  sexuality.  Vaginal dryness and thinning of the vaginal walls. This may cause painful sex.  Dryness of the skin and development of wrinkles.  Headaches.  Problems sleeping (insomnia).  Mood swings or irritability.  Memory problems.  Weight gain.  Hair growth on the face and chest.  Bladder infections or problems with urinating. How is this diagnosed? This condition is diagnosed based on your medical history, a physical exam, your age, your menstrual history, and your symptoms. Hormone tests may also be done. How is this treated? In some cases, no treatment is needed. You and your health care provider should make a decision together about whether treatment is necessary. Treatment will be based on your individual condition and preferences. Treatment for this condition focuses on managing symptoms. Treatment may include:  Menopausal hormone therapy (MHT).  Medicines to treat specific symptoms or complications.  Acupuncture.  Vitamin or herbal supplements. Before starting treatment, make sure to let your health care provider know if you have a personal or family history of these conditions:  Heart disease.  Breast cancer.  Blood clots.  Diabetes.  Osteoporosis. Follow these instructions at home: Lifestyle  Do not use any products that contain nicotine or tobacco, such as cigarettes, e-cigarettes, and chewing tobacco. If you need help quitting, ask your health care provider.  Get at least 30 minutes of physical activity on 5 or more days each week.  Avoid alcoholic and caffeinated beverages, as well as spicy foods. This may help prevent hot flashes.  Get 7-8 hours of sleep each night.  If you have hot flashes, try: ? Dressing in layers. ? Avoiding things that may trigger hot flashes, such as spicy food, warm places, or stress. ? Taking slow, deep   breaths when a hot flash starts. ? Keeping a fan in your home and office.  Find ways to manage stress, such as deep  breathing, meditation, or journaling.  Consider going to group therapy with other women who are having menopause symptoms. Ask your health care provider about recommended group therapy meetings. Eating and drinking  Eat a healthy, balanced diet that contains whole grains, lean protein, low-fat dairy, and plenty of fruits and vegetables.  Your health care provider may recommend adding more soy to your diet. Foods that contain soy include tofu, tempeh, and soy milk.  Eat plenty of foods that contain calcium and vitamin D for bone health. Items that are rich in calcium include low-fat milk, yogurt, beans, almonds, sardines, broccoli, and kale.   Medicines  Take over-the-counter and prescription medicines only as told by your health care provider.  Talk with your health care provider before starting any herbal supplements. If prescribed, take vitamins and supplements as told by your health care provider. General instructions  Keep track of your menstrual periods, including: ? When they occur. ? How heavy they are and how long they last. ? How much time passes between periods.  Keep track of your symptoms, noting when they start, how often you have them, and how long they last.  Use vaginal lubricants or moisturizers to help with vaginal dryness and improve comfort during sex.  Keep all follow-up visits. This is important. This includes any group therapy or counseling.   Contact a health care provider if:  You are still having menstrual periods after age 55.  You have pain during sex.  You have not had a period for 12 months and you develop vaginal bleeding. Get help right away if you have:  Severe depression.  Excessive vaginal bleeding.  Pain when you urinate.  A fast or irregular heartbeat (palpitations).  Severe headaches.  Abdominal pain or severe indigestion. Summary  Menopause is a normal time of life when menstrual periods stop completely. It is usually defined as  the absence of a menstrual period for 12 months without another medical cause.  The transition to menopause (perimenopause) most often happens between the ages of 45 and 55 and can last for several years.  Symptoms can be managed through medicines, lifestyle changes, and complementary therapies such as acupuncture.  Eat a balanced diet that is rich in nutrients to promote bone health and heart health and to manage symptoms during menopause. This information is not intended to replace advice given to you by your health care provider. Make sure you discuss any questions you have with your health care provider. Document Revised: 10/24/2019 Document Reviewed: 07/10/2019 Elsevier Patient Education  2021 Elsevier Inc.  

## 2020-04-19 NOTE — Progress Notes (Signed)
Patient ID: [name], female   DOB: 11-26-1950, 70 y.o.   MRN: [id number]  Chief Complaint  Patient presents with  . Follow-up    HPI [name] is a 70 y.o. female.  [id number] No LMP recorded (lmp unknown). Patient is postmenopausal. No menses since her early 53's. She was seen in ED for abdominal pain 03/26/19  5 mm endometrial stripe with homogeneous CT appearance. ED referred her for evaluation. Pain is inconsistent and she has no bleeding or discharge HPI  Past Medical History:  Diagnosis Date  . Arthritis   . Asthma   . Difficulty sleeping   . Gallstones   . History of kidney stones   . Hyperlipidemia   . Hypertension   . Preoperative clearance   . Spasmodic cough 06/2017   "IT COMES IN DIFFERENT SEASONS"  . Stroke ([place]) 2015   WEAKNESS L SIDE OF BODY     Past Surgical History:  Procedure Laterality Date  . CHOLECYSTECTOMY N/A 04/12/2015   Procedure: LAPAROSCOPIC CHOLECYSTECTOMY WITH INTRAOPERATIVE CHOLANGIOGRAM;  Surgeon: Greer Pickerel, MD;  Location: [facility];  Service: General;  Laterality: N/A;  . gall stones    . KNEE ARTHROSCOPY  2010  . TOTAL KNEE ARTHROPLASTY Left 07/17/2019   Procedure: LEFT TOTAL KNEE ARTHROPLASTY;  Surgeon: Meredith Pel, MD;  Location: [facility];  Service: Orthopedics;  Laterality: Left;  . TUBAL LIGATION      Family History  Problem Relation Age of Onset  . Diabetes Mother   . Colon cancer Father 87  . Colon polyps Neg Hx   . Esophageal cancer Neg Hx   . Rectal cancer Neg Hx   . Stomach cancer Neg Hx     Social History Social History   Tobacco Use  . Smoking status: Former Smoker    Quit date: 04/08/1994    Years since quitting: 26.0  . Smokeless tobacco: Never Used  Vaping Use  . Vaping Use: Never used  Substance Use Topics  . Alcohol use: No  . Drug use: No    No Known Allergies  Current Outpatient Medications  Medication Sig Dispense Refill  . amLODipine (NORVASC) 5 MG tablet Take 5 mg by mouth daily.     Marland Kitchen  aspirin EC 81 MG tablet Take 81 mg by mouth daily.    . celecoxib (CELEBREX) 200 MG capsule Take 1 capsule (200 mg total) by mouth 2 (two) times daily. 60 capsule 0  . folic acid (FOLVITE) 1 MG tablet Take 1 mg by mouth daily.    Marland Kitchen ibuprofen (ADVIL) 600 MG tablet Take 600 mg by mouth every 6 (six) hours as needed.    . meclizine (ANTIVERT) 25 MG tablet Take 25 mg by mouth 3 (three) times daily as needed.    . potassium chloride (KLOR-CON) 10 MEQ tablet Take 10 mEq by mouth daily.    Marland Kitchen Respiratory Therapy Supplies (FLUTTER) DEVI Use as directed 1 each 0  . Vitamin D, Ergocalciferol, (DRISDOL) 1.25 MG (50000 UNIT) CAPS capsule Take 50,000 Units by mouth once a week.    Marland Kitchen albuterol (ACCUNEB) 1.25 MG/3ML nebulizer solution Take 1 ampule by nebulization in the morning and at bedtime. (Patient not taking: Reported on 04/19/2020)    . atorvastatin (LIPITOR) 40 MG tablet Take 1 tablet (40 mg total) by mouth daily at 6 PM. (Patient not taking: Reported on 04/19/2020) 30 tablet 1  . methocarbamol (ROBAXIN) 500 MG tablet Take 1 tablet (500 mg total) by mouth every 8 (  eight) hours as needed. (Patient not taking: No sig reported) 30 tablet 0  . Nebulizer MISC by Does not apply route. (Patient not taking: Reported on 04/19/2020)    . oxyCODONE (OXY IR/ROXICODONE) 5 MG immediate release tablet Take 1 tablet (5 mg total) by mouth every 4 (four) hours as needed for moderate pain (pain score 4-6). (Patient not taking: No sig reported) 30 tablet 0   No current facility-administered medications for this visit.    Review of Systems Review of Systems  Respiratory: Negative.   Gastrointestinal: Positive for abdominal pain (occasional).  Genitourinary: Negative for dysuria, hematuria, vaginal bleeding and vaginal discharge.  Musculoskeletal: Positive for myalgias.    Blood pressure 139/80, pulse 82, height 5\' 3"  (1.6 m), weight 240 lb 1.6 oz (108.9 kg).  Physical Exam Physical Exam Vitals and nursing note  reviewed.  Constitutional:      Appearance: She is obese. She is not ill-appearing.  HENT:     Head: Normocephalic.  Pulmonary:     Effort: Pulmonary effort is normal.  Neurological:     Mental Status: She is alert.  Psychiatric:        Mood and Affect: Mood normal.        Behavior: Behavior normal.     Data Reviewed Narrative & Impression  CLINICAL DATA:  Acute nonlocalized abdominal pain  EXAM: CT ABDOMEN AND PELVIS WITH CONTRAST  TECHNIQUE: Multidetector CT imaging of the abdomen and pelvis was performed using the standard protocol following bolus administration of intravenous contrast.  CONTRAST:  153mL OMNIPAQUE IOHEXOL 300 MG/ML  SOLN  COMPARISON:  None.  FINDINGS: Lower chest: Subpleural opacity with volume loss in the right lower lobe with similar pattern to 2017 and most consistent with scarring.  Hepatobiliary: No focal liver abnormality.Cholecystectomy. No bile duct dilatation.  Pancreas: Unremarkable.  Spleen: Unremarkable.  Adrenals/Urinary Tract: Negative adrenals. No hydronephrosis or stone. 5.6 cm left renal cyst.unremarkable bladder.  Stomach/Bowel:  No obstruction. No appendicitis.  Vascular/Lymphatic: No acute vascular abnormality. Atheromatous calcification of the aorta. No mass or adenopathy.  .  Other: No ascites or pneumoperitoneum.  Musculoskeletal: No acute abnormalities. Generalized facet and endplate spurring.  IMPRESSION: 1. No acute finding. 2. 5 mm endometrial stripe, please correlate for abnormal uterine bleeding. 3.  Aortic Atherosclerosis (ICD10-I70.0).   Electronically Signed   By: Monte Fantasia M.D.   On: 03/25/2020 06:27      Assessment CT result: Reproductive:5 mm endometrial stripe with homogeneous CT appearance This is an incidental finding not associated with postmenopausal bleeding and no further evaluation is indicated. This was explained to her to give reassurance Plan  Routine  f/u with her PCP and other providers Reiterated that she does not need pap smears    Emeterio Reeve 04/19/2020, 4:18 PM

## 2020-04-28 ENCOUNTER — Telehealth: Payer: Self-pay

## 2020-04-28 NOTE — Telephone Encounter (Signed)
Submitted VOB for Monovisc, right knee. Pending BV 

## 2020-05-11 ENCOUNTER — Telehealth: Payer: Self-pay

## 2020-05-11 NOTE — Telephone Encounter (Signed)
PA submitted for Monovisc, right knee through [facility] online. Pending PA #[id number]

## 2020-05-14 ENCOUNTER — Telehealth: Payer: Self-pay

## 2020-05-14 NOTE — Telephone Encounter (Signed)
Updated note needed stating that patient has had an inadequate response to conservative non-pharmacologic treatments such as, education, strengthening and ROM exercises, assisted devices, and weight loss.  Please advise.  Thank you.

## 2020-05-14 NOTE — Telephone Encounter (Signed)
See below

## 2020-05-30 NOTE — Telephone Encounter (Signed)
[name] has had inadequate response to conservative nonpharmacologic treatments such as education quad strengthening range of motion exercises assistive devices and weight loss.  She would benefit from gel injection as a nonoperative measure to improve her pain and enhance her activities of daily living

## 2020-05-31 ENCOUNTER — Telehealth: Payer: Self-pay | Admitting: Orthopedic Surgery

## 2020-05-31 NOTE — Telephone Encounter (Signed)
Noted! Thank you

## 2020-05-31 NOTE — Telephone Encounter (Signed)
See below

## 2020-05-31 NOTE — Telephone Encounter (Signed)
Pt called stating she is wanting to get a gel injection in her knee. She would like our office to submit the autho and try to get her in before 06/18/20. I did let the pt know it typically takes at least 2 weeks to send the autho and hear back; pt would like to get updates throughout the process.   [phone]

## 2020-06-01 ENCOUNTER — Telehealth: Payer: Self-pay

## 2020-06-01 NOTE — Telephone Encounter (Signed)
Talked with patient and appointment has been scheduled for gel injection.  

## 2020-06-01 NOTE — Telephone Encounter (Signed)
Approved for Monovisc, right knee. [place] Patient will be responsible for 20% OOP. Co-pay of $20.00 PA Approval# [id number] Valid 05/11/2020- 06/10/2020  Appt. 06/07/2020 with Dr. Marlou Sa

## 2020-06-07 ENCOUNTER — Ambulatory Visit: Payer: Medicare HMO | Admitting: Orthopedic Surgery

## 2020-06-07 DIAGNOSIS — M170 Bilateral primary osteoarthritis of knee: Secondary | ICD-10-CM

## 2020-06-07 DIAGNOSIS — M1711 Unilateral primary osteoarthritis, right knee: Secondary | ICD-10-CM | POA: Diagnosis not present

## 2020-06-08 ENCOUNTER — Encounter: Payer: Self-pay | Admitting: Orthopedic Surgery

## 2020-06-08 DIAGNOSIS — M1711 Unilateral primary osteoarthritis, right knee: Secondary | ICD-10-CM | POA: Diagnosis not present

## 2020-06-08 MED ORDER — LIDOCAINE HCL 1 % IJ SOLN
5.0000 mL | INTRAMUSCULAR | Status: AC | PRN
  Administered 2020-06-07: 5 mL

## 2020-06-08 MED ORDER — HYALURONAN 88 MG/4ML IX SOSY
88.0000 mg | PREFILLED_SYRINGE | INTRA_ARTICULAR | Status: AC | PRN
  Administered 2020-06-07: 88 mg via INTRA_ARTICULAR

## 2020-06-08 NOTE — Progress Notes (Signed)
Hi Lauren anyway you can change the date on this to 5-22 instead of 5 3.  Thanks

## 2020-06-08 NOTE — Progress Notes (Addendum)
   Procedure Note  Patient: [name]             Date of Birth: 11-Nov-1950           MRN: [id number]             Visit Date: 06/07/2020  Procedures: Visit Diagnoses:  1. Bilateral primary osteoarthritis of knee     Large Joint Inj on 06/07/2020 10:00 PM Indications: pain, joint swelling and diagnostic evaluation Details: 18 G 1.5 in needle, superolateral approach  Arthrogram: No  Medications: 5 mL lidocaine 1 %; 88 mg Hyaluronan 88 MG/4ML Outcome: tolerated well, no immediate complications Procedure, treatment alternatives, risks and benefits explained, specific risks discussed. Consent was given by the patient. Immediately prior to procedure a time out was called to verify the correct patient, procedure, equipment, support staff and site/side marked as required. Patient was prepped and draped in the usual sterile fashion.     Patient has flexion contracture right knee of about 10 degrees with flexion to about 90.  Pedal pulses palpable.  Collaterals are stable.  Extensor mechanism is intact.

## 2020-06-09 ENCOUNTER — Telehealth: Payer: Self-pay

## 2020-06-09 NOTE — Telephone Encounter (Signed)
Patient wanting to proceed with Right TKA

## 2020-06-09 NOTE — Telephone Encounter (Signed)
I talked with the patient and she is having continued pain in the knee.  She would like to have her knee "fixed" before a wedding in September.  With her other knee replacement she was able to get over it in about 2 months.  She very much wants to have this knee replaced so she can be functional by September.  Patient understands risk and benefits.  Plan to proceed with knee replacement due to failure of conservative management including gel injections and cortisone injections.  She has good experience with her contralateral knee replacement.

## 2020-07-08 ENCOUNTER — Telehealth: Payer: Self-pay | Admitting: *Deleted

## 2020-07-08 ENCOUNTER — Telehealth: Payer: Self-pay | Admitting: Orthopedic Surgery

## 2020-07-08 NOTE — Telephone Encounter (Signed)
Pt is calling asking about surgery questions. She states she has left Debi messages and hasn't heard back.  CB [phone]

## 2020-07-08 NOTE — Telephone Encounter (Signed)
Error phone call message. Wrong patient.

## 2020-09-14 ENCOUNTER — Other Ambulatory Visit: Payer: Self-pay

## 2020-10-12 ENCOUNTER — Other Ambulatory Visit: Payer: Self-pay | Admitting: Internal Medicine

## 2020-10-12 DIAGNOSIS — E2839 Other primary ovarian failure: Secondary | ICD-10-CM

## 2020-10-18 NOTE — Pre-Procedure Instructions (Signed)
Surgical Instructions    Your procedure is scheduled on Tuesday, November 02, 2020 at 12:33 PM.  Report to [facility] Main Entrance "A" at 10:30 A.M., then check in with the Admitting office.  Call this number if you have problems the morning of surgery:  [phone]   If you have any questions prior to your surgery date call [phone]: Open Monday-Friday 8am-4pm    Remember:  Do not eat after midnight the night before your surgery  You may drink clear liquids until 9:30 AM the morning of your surgery.   Clear liquids allowed are: Water, Non-Citrus Juices (without pulp), Carbonated Beverages, Clear Tea, Black Coffee ONLY (NO MILK, CREAM OR POWDERED CREAMER of any kind), and Gatorade  Please complete your 8 OZ WATER that was provided to you by 9:30 AM the morning of surgery.     Take these medicines the morning of surgery with A SIP OF WATER:  albuterol (ACCUNEB) Fluticasone-Umeclidin-Vilant (TRELEGY ELLIPTA)   IF NEEDED: benzonatate (TESSALON) meclizine (ANTIVERT)  Follow your surgeon's instructions on when to stop Aspirin.  If no instructions were given by your surgeon then you will need to call the office to get those instructions.     As of today, STOP taking any Aleve, Naproxen, Ibuprofen, Motrin, Advil, Goody's, BC's, all herbal medications, fish oil, and all vitamins.          Do not wear jewelry or makeup Do not wear lotions, powders, perfumes, or deodorant. Do not shave 48 hours prior to surgery. Do not bring valuables to the hospital. DO Not wear nail polish, gel polish, artificial nails, or any other type of covering on natural nails including finger and toenails. If patients have artificial nails, gel coating, etc. that need to be removed by a nail salon please have this removed prior to surgery or surgery may need to be canceled/delayed if the surgeon/ anesthesia feels like the patient is unable to be adequately monitored.             [facility] is not  responsible for any belongings or valuables.  Do NOT Smoke (Tobacco/Vaping)  24 hours prior to your procedure If you use a CPAP at night, you may bring your mask for your overnight stay.   Contacts, glasses, dentures or bridgework may not be worn into surgery, please bring cases for these belongings   For patients admitted to the hospital, discharge time will be determined by your treatment team.   Patients discharged the day of surgery will not be allowed to drive home, and someone needs to stay with them for 24 hours.  ONLY 1 SUPPORT PERSON MAY BE PRESENT WHILE YOU ARE IN SURGERY. IF YOU ARE TO BE ADMITTED ONCE YOU ARE IN YOUR ROOM YOU WILL BE ALLOWED TWO (2) VISITORS.  Minor children may have two parents present. Special consideration for safety and communication needs will be reviewed on a case by case basis.  Special instructions:    Oral Hygiene is also important to reduce your risk of infection.  Remember - BRUSH YOUR TEETH THE MORNING OF SURGERY WITH YOUR REGULAR TOOTHPASTE   [facility]- Preparing For Surgery  Before surgery, you can play an important role. Because skin is not sterile, your skin needs to be as free of germs as possible. You can reduce the number of germs on your skin by washing with CHG (chlorahexidine gluconate) Soap before surgery.  CHG is an antiseptic cleaner which kills germs and bonds with the skin to continue  killing germs even after washing.     Please do not use if you have an allergy to CHG or antibacterial soaps. If your skin becomes reddened/irritated stop using the CHG.  Do not shave (including legs and underarms) for at least 48 hours prior to first CHG shower. It is OK to shave your face.  Please follow these instructions carefully.     Shower the NIGHT BEFORE SURGERY and the MORNING OF SURGERY with CHG Soap.   If you chose to wash your hair, wash your hair first as usual with your normal shampoo. After you shampoo, rinse your hair and body  thoroughly to remove the shampoo.  Then [employer] and genitals (private parts) with your normal soap and rinse thoroughly to remove soap.  After that Use CHG Soap as you would any other liquid soap. You can apply CHG directly to the skin and wash gently with a scrungie or a clean washcloth.   Apply the CHG Soap to your body ONLY FROM THE NECK DOWN.  Do not use on open wounds or open sores. Avoid contact with your eyes, ears, mouth and genitals (private parts). Wash Face and genitals (private parts)  with your normal soap.   Wash thoroughly, paying special attention to the area where your surgery will be performed.  Thoroughly rinse your body with warm water from the neck down.  DO NOT shower/wash with your normal soap after using and rinsing off the CHG Soap.  Pat yourself dry with a CLEAN TOWEL.  Wear CLEAN PAJAMAS to bed the night before surgery  Place CLEAN SHEETS on your bed the night before your surgery  DO NOT SLEEP WITH PETS.   Day of Surgery:  Take a shower with CHG soap. Wear Clean/Comfortable clothing the morning of surgery Do not apply any deodorants/lotions.   Remember to brush your teeth WITH YOUR REGULAR TOOTHPASTE.   Please read over the following fact sheets that you were given.

## 2020-10-19 ENCOUNTER — Encounter (HOSPITAL_COMMUNITY): Payer: Self-pay

## 2020-10-19 ENCOUNTER — Other Ambulatory Visit: Payer: Self-pay

## 2020-10-19 ENCOUNTER — Encounter (HOSPITAL_COMMUNITY)
Admission: RE | Admit: 2020-10-19 | Discharge: 2020-10-19 | Disposition: A | Discharge status: Home / Self Care | Payer: Medicare HMO | Source: Ambulatory Visit | Attending: Orthopedic Surgery | Admitting: Orthopedic Surgery

## 2020-10-19 DIAGNOSIS — Z01812 Encounter for preprocedural laboratory examination: Secondary | ICD-10-CM | POA: Insufficient documentation

## 2020-10-19 HISTORY — DX: Pneumonia, unspecified organism: J18.9

## 2020-10-19 LAB — URINALYSIS, ROUTINE W REFLEX MICROSCOPIC
Bilirubin Urine: NEGATIVE
Glucose, UA: NEGATIVE mg/dL
Hgb urine dipstick: NEGATIVE
Ketones, ur: NEGATIVE mg/dL
Leukocytes,Ua: NEGATIVE
Nitrite: NEGATIVE
Protein, ur: NEGATIVE mg/dL
Specific Gravity, Urine: 1.02 (ref 1.005–1.030)
pH: 5 (ref 5.0–8.0)

## 2020-10-19 LAB — CBC
HCT: 36.8 % (ref 36.0–46.0)
Hemoglobin: 11.9 g/dL — ABNORMAL LOW (ref 12.0–15.0)
MCH: 28.9 pg (ref 26.0–34.0)
MCHC: 32.3 g/dL (ref 30.0–36.0)
MCV: 89.3 fL (ref 80.0–100.0)
Platelets: 271 10*3/uL (ref 150–400)
RBC: 4.12 MIL/uL (ref 3.87–5.11)
RDW: 14.6 % (ref 11.5–15.5)
WBC: 5.9 10*3/uL (ref 4.0–10.5)
nRBC: 0 % (ref 0.0–0.2)

## 2020-10-19 LAB — BASIC METABOLIC PANEL
Anion gap: 9 (ref 5–15)
BUN: 17 mg/dL (ref 8–23)
CO2: 25 mmol/L (ref 22–32)
Calcium: 9 mg/dL (ref 8.9–10.3)
Chloride: 107 mmol/L (ref 98–111)
Creatinine, Ser: 0.7 mg/dL (ref 0.44–1.00)
GFR, Estimated: >60 mL/min (ref >60)
Glucose, Bld: 127 mg/dL — ABNORMAL HIGH (ref 70–99)
Potassium: 3.2 mmol/L — ABNORMAL LOW (ref 3.5–5.1)
Sodium: 141 mmol/L (ref 135–145)

## 2020-10-19 LAB — SURGICAL PCR SCREEN
MRSA, PCR: NEGATIVE
Staphylococcus aureus: NEGATIVE

## 2020-10-19 NOTE — Progress Notes (Signed)
   10/19/20 1427  OBSTRUCTIVE SLEEP APNEA  Have you ever been diagnosed with sleep apnea through a sleep study? No  Do you snore loudly (loud enough to be heard through closed doors)?  1  Do you often feel tired, fatigued, or sleepy during the daytime (such as falling asleep during driving or talking to someone)? 0  Has anyone observed you stop breathing during your sleep? 1  Do you have, or are you being treated for high blood pressure? 1  BMI more than 35 kg/m2? 1  Age > 36 (1-yes) 1  Female Gender (Yes=1) 0  Obstructive Sleep Apnea Score 5  Score 5 or greater  Results sent to PCP

## 2020-10-19 NOTE — Progress Notes (Signed)
PCP - Dr. Nolene Ebbs Cardiologist - Denies  PPM/ICD - Denies  Chest x-ray - N/A EKG - 03/25/20 Stress Test - 03/24/15 ECHO - 08/24/13 Cardiac Cath - Denies  Sleep Study - Denies  Patient denies having diabetes.  Blood Thinner Instructions: N/A Aspirin Instructions: Patient states she would call surgeon's office to clarify ASA instructions.  ERAS Protcol - Yes PRE-SURGERY Ensure - Yes  COVID TEST- Scheduled 10/29/20 @ 0905   Anesthesia review: Yes, previous EKG abnormal  Patient denies shortness of breath, fever, cough and chest pain at PAT appointment   All instructions explained to the patient, with a verbal understanding of the material. Patient agrees to go over the instructions while at home for a better understanding. Patient also instructed to self quarantine after being tested for COVID-19. The opportunity to ask questions was provided.

## 2020-10-20 ENCOUNTER — Telehealth: Payer: Self-pay | Admitting: Orthopedic Surgery

## 2020-10-20 ENCOUNTER — Encounter (HOSPITAL_COMMUNITY): Payer: Self-pay | Admitting: Anesthesiology

## 2020-10-20 ENCOUNTER — Encounter (HOSPITAL_COMMUNITY): Payer: Self-pay | Admitting: Vascular Surgery

## 2020-10-20 NOTE — Anesthesia Preprocedure Evaluation (Deleted)
Anesthesia Evaluation  Patient identified by MRN, date of birth, ID band Patient awake    Reviewed: Allergy & Precautions, NPO status , Patient's Chart, lab work & pertinent test results  Airway Mallampati: II  TM Distance: >3 FB Neck ROM: Full    Dental  (+) Edentulous Upper, Edentulous Lower, Dental Advisory Given   Pulmonary asthma , pneumonia, former smoker,    Pulmonary exam normal breath sounds clear to auscultation       Cardiovascular hypertension, Pt. on medications Normal cardiovascular exam Rhythm:Regular Rate:Normal     Neuro/Psych CVA, Residual Symptoms    GI/Hepatic Neg liver ROS, GERD  ,  Endo/Other  Morbid obesity  Renal/GU negative Renal ROS     Musculoskeletal  (+) Arthritis ,   Abdominal (+) + obese,   Peds  Hematology  (+) Blood dyscrasia, anemia ,   Anesthesia Other Findings   Reproductive/Obstetrics                                                             Anesthesia Evaluation  Patient identified by MRN, date of birth, ID band Patient awake    Reviewed: Allergy & Precautions, NPO status , Patient's Chart, lab work & pertinent test results  Airway Mallampati: II  TM Distance: >3 FB Neck ROM: Full    Dental  (+) Dental Advisory Given, Edentulous Upper, Edentulous Lower   Pulmonary asthma , former smoker,    Pulmonary exam normal breath sounds clear to auscultation       Cardiovascular hypertension, Pt. on medications Normal cardiovascular exam Rhythm:Regular Rate:Normal     Neuro/Psych Seizures -,  CVA (L sided weakness), Residual Symptoms    GI/Hepatic Neg liver ROS, GERD  ,  Endo/Other  negative endocrine ROSObesity   Renal/GU negative Renal ROS     Musculoskeletal  (+) Arthritis  ( left knee osteoarthritis), Osteoarthritis,    Abdominal   Peds  Hematology  (+) Blood dyscrasia, anemia , Plt 253k   Anesthesia Other  Findings Day of surgery medications reviewed with the patient.  Reproductive/Obstetrics                           Anesthesia Physical Anesthesia Plan  ASA: III  Anesthesia Plan: Spinal   Post-op Pain Management:  Regional for Post-op pain   Induction:   PONV Risk Score and Plan: 2 and Propofol infusion, Treatment may vary due to age or medical condition and Midazolam  Airway Management Planned: Natural Airway and Nasal Cannula  Additional Equipment:   Intra-op Plan:   Post-operative Plan:   Informed Consent: I have reviewed the patients History and Physical, chart, labs and discussed the procedure including the risks, benefits and alternatives for the proposed anesthesia with the patient or authorized representative who has indicated his/her understanding and acceptance.     Dental advisory given  Plan Discussed with: CRNA, Anesthesiologist and Surgeon  Anesthesia Plan Comments: (PAT note written 07/16/2019 by Myra Gianotti, PA-C. )       Anesthesia Quick Evaluation  Anesthesia Physical Anesthesia Plan  ASA: 3  Anesthesia Plan: Spinal   Post-op Pain Management:  Regional for Post-op pain   Induction:   PONV Risk Score and Plan: 3 and Propofol infusion, TIVA, Treatment may vary due to  age or medical condition and Ondansetron  Airway Management Planned: Natural Airway  Additional Equipment:   Intra-op Plan:   Post-operative Plan:   Informed Consent: I have reviewed the patients History and Physical, chart, labs and discussed the procedure including the risks, benefits and alternatives for the proposed anesthesia with the patient or authorized representative who has indicated his/her understanding and acceptance.     Dental advisory given  Plan Discussed with: CRNA  Anesthesia Plan Comments: (PAT note written 10/20/2020 by Myra Gianotti, PA-C. )       Anesthesia Quick Evaluation

## 2020-10-20 NOTE — Progress Notes (Addendum)
Anesthesia Chart Review:  Case: [id number] Date/Time: 11/02/20 1218   Procedure: RIGHT TOTAL KNEE ARTHROPLASTY (Right: Knee)   Anesthesia type: Spinal   Pre-op diagnosis: right knee osteoarthritis   Location: [facility] OR ROOM 06 / [facility]   Surgeons: Meredith Pel, MD       DISCUSSION: Patient is a 70 year old female scheduled for the above procedure. She is s/p left TKA 07/17/19.    History includes former smoker (quit 04/08/94), HTN, HLD, CVA (2015, left sided weakness), asthma (with seasonal "spasmodic cough"), difficulty sleeping, cholecystectomy (2017), TKA (left 07/17/19).  BMI is consistent with morbid obesity.  OSA screening score is 5.  - ED visit 03/25/20 for abdominal pain. WBC, Lipase, and Creatinine normal. HGB stable 11.4. AST 12, ALT 13, total bili 1.1. HS Troponin normal. EKG felt stable. CT abdomen pelvis with no acute findings, incidental findings of 5.6 cm left renal cyst and 5 mm endometrial stripe. She was not having current vaginal bleeding so advised follow-up with PCP and GYN.  EKG on 03/25/20 showed SR, abnormal R wave progress, early transition, and LVH with repolarization abnormality. T wave abnormality is more pronounced when compared to 2019/08/12 tracing but similar to 03/27/15 tracing, and she had a normal pharmacologic nuclear stress test 03/24/15 at that time (noted T wave inversions in II, III, V4-6 with no evidence of ischemia or infarction). She denied SOB and chest pain at PAT RN visit.   UA was normal. Her urine culture is still in process.   Preoperative COVID-19 test is scheduled for 10/29/20. She contacted surgeon regarding perioperative ASA instructions. Anesthesia team to evaluate on the day of surgery.  UPDATE 10/21/20 10:28 AM:  Urine culture preliminary results showed 20K Gram negative rods. This was communicated to [place] at Dr. Randel Pigg office. Defer additional recommendations to surgeon.    VS: BP 139/72   Pulse (!) 102   Temp 36.8 C (Oral)   Resp 19   Ht 5'  3" (1.6 m)   Wt 106.6 kg   LMP  (LMP Unknown)   SpO2 98%   BMI 41.63 kg/m    PROVIDERS: Nolene Ebbs, MD is PCP Ellouise Newer, MD is neurologist - She is not followed by cardiology routinely, but had a preoperative evaluation with Quay Burow, MD in 2017 prior to undergoing cholecystectomy and had a low risk stress test.   LABS: Labs reviewed: Acceptable for surgery. (all labs ordered are listed, but only abnormal results are displayed)  Labs Reviewed  CBC - Abnormal; Notable for the following components:      Result Value   Hemoglobin 11.9 (*)    All other components within normal limits  BASIC METABOLIC PANEL - Abnormal; Notable for the following components:   Potassium 3.2 (*)    Glucose, Bld 127 (*)    All other components within normal limits  SURGICAL PCR SCREEN  URINE CULTURE  URINALYSIS, ROUTINE W REFLEX MICROSCOPIC    Spirometry 10/31/16 Verlin Fester, Ralph, MD):  FVC was 1.16 L and FEV1 was 0.93 L (48% predicted) with 16% postbronchodilator improvement.  This study was performed while the patient was asymptomatic.      EKG:  See DISCUSSION. EKG 03/25/20: Sinus rhythm Abnormal R-wave progression, early transition Probable LVH with secondary repol abnrm No acute changes Confirmed by Addison Lank [phone]) on 03/25/2020 5:25:11 AM     CV: Nuclear stress test 03/24/15: Nuclear stress EF: 65%. The left ventricular ejection fraction is normal (55-65%). There was no ST segment deviation noted during  stress. T wave inversion was noted during stress in the II, III, V4, V5 and V6 leads. The study is normal. This is a low risk study. Normal pharmacologic nuclear study with no evidence for infarct or ischemia.     Echo 08/24/13: Study Conclusions  - Left ventricle: The cavity size was normal. Wall thickness was    increased increased in a pattern of mild to moderate LVH.    Systolic function was normal. The estimated ejection fraction was    in the range of 60%  to 65%. Wall motion was normal; there were no    regional wall motion abnormalities. Doppler parameters are    consistent with abnormal left ventricular relaxation (grade 1    diastolic dysfunction).  - Aortic valve: Valve area (VTI): 4.14 cm^2. Valve area (Vmax):    3.38 cm^2.  - Atrial septum: No defect or patent foramen ovale was identified.  - Technically difficult study.       Carotid US 08/24/13: Summary:  Findings suggest 1-39% internal carotid artery stenosis  bilaterally. Vertebral arteries are patent with antegrade flow.   Past Medical History:  Diagnosis Date   Arthritis    Asthma    Difficulty sleeping    Gallstones    History of kidney stones    Hyperlipidemia    Hypertension    Pneumonia    Preoperative clearance    Spasmodic cough 06/2017   "IT COMES IN DIFFERENT SEASONS"   Stroke ([place]) 2015   WEAKNESS L SIDE OF BODY     Past Surgical History:  Procedure Laterality Date   CHOLECYSTECTOMY N/A 04/12/2015   Procedure: LAPAROSCOPIC CHOLECYSTECTOMY WITH INTRAOPERATIVE CHOLANGIOGRAM;  Surgeon: Greer Pickerel, MD;  Location: [facility];  Service: General;  Laterality: N/A;   gall stones     KNEE ARTHROSCOPY  2010   TOTAL KNEE ARTHROPLASTY Left 07/17/2019   Procedure: LEFT TOTAL KNEE ARTHROPLASTY;  Surgeon: Meredith Pel, MD;  Location: [facility];  Service: Orthopedics;  Laterality: Left;   TUBAL LIGATION      MEDICATIONS:  albuterol (ACCUNEB) 1.25 MG/3ML nebulizer solution   aspirin EC 81 MG tablet   atorvastatin (LIPITOR) 40 MG tablet   benzonatate (TESSALON) 100 MG capsule   celecoxib (CELEBREX) 200 MG capsule   Fluticasone-Umeclidin-Vilant (TRELEGY ELLIPTA) 200-62.5-25 MCG/INH AEPB   folic acid (FOLVITE) 1 MG tablet   ibuprofen (ADVIL) 600 MG tablet   losartan (COZAAR) 25 MG tablet   meclizine (ANTIVERT) 25 MG tablet   methocarbamol (ROBAXIN) 500 MG tablet   Nebulizer MISC   oxyCODONE (OXY IR/ROXICODONE) 5 MG immediate release tablet   potassium chloride  (KLOR-CON) 10 MEQ tablet   Respiratory Therapy Supplies (FLUTTER) DEVI   Vitamin D, Ergocalciferol, (DRISDOL) 1.25 MG (50000 UNIT) CAPS capsule   No current facility-administered medications for this encounter.    Myra Gianotti, PA-C Surgical Short Stay/Anesthesiology [facility] Phone [phone] [facility] Phone [phone] 10/20/2020 3:04 PM

## 2020-10-20 NOTE — Telephone Encounter (Signed)
Stop aspirin 7 days prior to surgery due to spinal

## 2020-10-20 NOTE — Telephone Encounter (Signed)
Patient called. She is having surgery and would like to know when she should stop taking her aspirin? Her call back number is [phone]

## 2020-10-21 NOTE — Telephone Encounter (Signed)
IC advised. Patient verbalized understanding 

## 2020-10-22 LAB — URINE CULTURE: Culture: 20000 — AB

## 2020-10-29 ENCOUNTER — Other Ambulatory Visit (HOSPITAL_COMMUNITY)
Admission: RE | Admit: 2020-10-29 | Discharge: 2020-10-29 | Disposition: A | Discharge status: Home / Self Care | Payer: Medicare HMO | Source: Ambulatory Visit | Attending: Orthopedic Surgery | Admitting: Orthopedic Surgery

## 2020-10-29 DIAGNOSIS — Z01812 Encounter for preprocedural laboratory examination: Secondary | ICD-10-CM | POA: Diagnosis present

## 2020-10-29 DIAGNOSIS — Z20822 Contact with and (suspected) exposure to covid-19: Secondary | ICD-10-CM | POA: Diagnosis not present

## 2020-10-29 LAB — SARS CORONAVIRUS 2 (TAT 6-24 HRS): SARS Coronavirus 2: NEGATIVE

## 2020-10-30 ENCOUNTER — Other Ambulatory Visit: Payer: Self-pay | Admitting: Surgical

## 2020-10-30 MED ORDER — NITROFURANTOIN MONOHYD MACRO 100 MG PO CAPS: 100.0000 mg | ORAL_CAPSULE | Freq: Two times a day (BID) | ORAL | 0 refills | Status: AC

## 2020-10-30 NOTE — Progress Notes (Signed)
Thx  - this one in the grey zone with nit and le neg

## 2020-11-01 MED ORDER — TRANEXAMIC ACID 1000 MG/10ML IV SOLN
2000.0000 mg | INTRAVENOUS | Status: DC
  Filled 2020-11-01: qty 20

## 2020-11-02 ENCOUNTER — Encounter (HOSPITAL_COMMUNITY)
Admission: RE | Disposition: A | Discharge status: Home / Self Care | Payer: Self-pay | Source: Home / Self Care | Attending: Orthopedic Surgery

## 2020-11-02 ENCOUNTER — Other Ambulatory Visit: Payer: Self-pay

## 2020-11-02 ENCOUNTER — Encounter (HOSPITAL_COMMUNITY): Payer: Self-pay | Admitting: Orthopedic Surgery

## 2020-11-02 ENCOUNTER — Ambulatory Visit (HOSPITAL_COMMUNITY)
Admission: RE | Admit: 2020-11-02 | Discharge: 2020-11-02 | Disposition: A | Discharge status: Home / Self Care | Payer: Medicare HMO | Attending: Orthopedic Surgery | Admitting: Orthopedic Surgery

## 2020-11-02 ENCOUNTER — Ambulatory Visit: Admit: 2020-11-02 | Payer: Medicare HMO | Admitting: Orthopedic Surgery

## 2020-11-02 DIAGNOSIS — X58XXXA Exposure to other specified factors, initial encounter: Secondary | ICD-10-CM | POA: Diagnosis not present

## 2020-11-02 DIAGNOSIS — M1711 Unilateral primary osteoarthritis, right knee: Secondary | ICD-10-CM | POA: Diagnosis present

## 2020-11-02 DIAGNOSIS — Z538 Procedure and treatment not carried out for other reasons: Secondary | ICD-10-CM | POA: Diagnosis not present

## 2020-11-02 DIAGNOSIS — S99821A Other specified injuries of right foot, initial encounter: Secondary | ICD-10-CM | POA: Insufficient documentation

## 2020-11-02 SURGERY — MINOR TOENAIL EXCISION
Anesthesia: LOCAL

## 2020-11-02 SURGERY — ARTHROPLASTY, KNEE, TOTAL
Anesthesia: Spinal | Site: Knee | Laterality: Right

## 2020-11-02 MED ORDER — POVIDONE-IODINE 10 % EX SWAB: 2.0000 "application " | Freq: Once | CUTANEOUS | Status: DC

## 2020-11-02 MED ORDER — LACTATED RINGERS IV SOLN: INTRAVENOUS | Status: DC

## 2020-11-02 MED ORDER — LIDOCAINE HCL (PF) 1 % IJ SOLN
INTRAMUSCULAR | Status: AC
  Filled 2020-11-02: qty 30

## 2020-11-02 MED ORDER — PROPOFOL 10 MG/ML IV BOLUS
INTRAVENOUS | Status: AC
  Filled 2020-11-02: qty 20

## 2020-11-02 MED ORDER — FENTANYL CITRATE (PF) 250 MCG/5ML IJ SOLN
INTRAMUSCULAR | Status: AC
  Filled 2020-11-02: qty 5

## 2020-11-02 MED ORDER — MIDAZOLAM HCL 2 MG/2ML IJ SOLN
INTRAMUSCULAR | Status: AC
  Filled 2020-11-02: qty 2

## 2020-11-02 MED ORDER — CHLORHEXIDINE GLUCONATE 0.12 % MT SOLN: 15.0000 mL | Freq: Once | OROMUCOSAL | Status: AC

## 2020-11-02 MED ORDER — CHLORHEXIDINE GLUCONATE 0.12 % MT SOLN
OROMUCOSAL | Status: AC
  Administered 2020-11-02: 15 mL via OROMUCOSAL
  Filled 2020-11-02: qty 15

## 2020-11-02 MED ORDER — FENTANYL CITRATE (PF) 100 MCG/2ML IJ SOLN
INTRAMUSCULAR | Status: AC
  Filled 2020-11-02: qty 2

## 2020-11-02 MED ORDER — CEFAZOLIN SODIUM-DEXTROSE 2-4 GM/100ML-% IV SOLN
2.0000 g | INTRAVENOUS | Status: AC
  Administered 2020-11-02: 2 g via INTRAVENOUS
  Filled 2020-11-02: qty 100

## 2020-11-02 MED ORDER — POVIDONE-IODINE 7.5 % EX SOLN: Freq: Once | CUTANEOUS | Status: DC

## 2020-11-02 MED ORDER — TRANEXAMIC ACID-NACL 1000-0.7 MG/100ML-% IV SOLN
1000.0000 mg | INTRAVENOUS | Status: DC
  Filled 2020-11-02: qty 100

## 2020-11-02 MED ORDER — ORAL CARE MOUTH RINSE: 15.0000 mL | Freq: Once | OROMUCOSAL | Status: AC

## 2020-11-02 NOTE — Progress Notes (Signed)
Patient surgery cancelled today due to infection found in right great toe. Dr. Marlou Sa performed a minor procedure in the room. Patient discharged with instructions to follow up with surgeon's office to reschedule surgery. IV was removed, catheter still intact. Patient wheeled out in wheel chair and sent home with daughter, [name].

## 2020-11-02 NOTE — Progress Notes (Signed)
[name] has injured her right great toe several days ago.  She has been having some pain with it since that time.  She is scheduled to have total knee replacement on the right-hand side. On examination the inferior portion of the nail has extruded from below the hyponychium.  There is slight amount of pus present.  For that reason I am going to cancel the knee replacement for today and reschedule within 4 weeks or so.  In the meantime we will numb up her toe with Marcaine solution and after informed consent and calling timeout the medial 6 mm of the toenail was removed to allow for healing.  This area is packed with iodoform gauze.  She will follow-up in clinic in 3 days for clinical recheck and change out of the gauze.  Hopefully this will facilitate earlier healing and allow for knee replacement sooner rather than later.  Discussed with her the risk of proceeding with knee replacement with an active infection distal to the knee replacement and she understands the reason why we will cancel the case.

## 2020-11-05 ENCOUNTER — Ambulatory Visit: Payer: Medicare HMO | Admitting: Surgical

## 2020-11-05 ENCOUNTER — Encounter: Payer: Self-pay | Admitting: Surgical

## 2020-11-05 ENCOUNTER — Other Ambulatory Visit: Payer: Self-pay

## 2020-11-05 DIAGNOSIS — L600 Ingrowing nail: Secondary | ICD-10-CM

## 2020-11-05 NOTE — Progress Notes (Signed)
Post-Op Visit Note   Patient: [name]           Date of Birth: February 15, 1950           MRN: [id number] Visit Date: 11/05/2020 PCP: Nolene Ebbs, MD   Assessment & Plan:  Chief Complaint:  Chief Complaint  Patient presents with   Other    Follow up from removal of ingrown toenail    Visit Diagnoses:  1. Ingrown nail of great toe of right foot     Plan: Patient is a 70 year old female who presents s/p partial excision of ingrown toenail on 11/02/2020.  Right great toe is doing well following procedure on Tuesday.  She denies any fevers, chills, night sweats, drainage from the procedure site.  She is ambulating full weightbearing in a croc without difficulty.  Not having to take any medication out of her normal medication schedule in order to treat pain.  On examination there is no expressible drainage from the nailbed and there is no significant surrounding erythema.  No real tenderness to palpation.  Patient appears well.  There is not much space to really pack anything so plan to just cover the area with a Band-Aid and she will follow-up with the office next Friday morning for clinical recheck with Dr. Marlou Sa.  At that point we can discuss posting her for surgery on her right knee for right total knee replacement likely in early November.  Counseled her about the signs and symptoms of infection she should be concerned about and she will call the office on-call line if there are any concerning symptoms that she develops.     Follow-Up Instructions: No follow-ups on file.   Orders:  No orders of the defined types were placed in this encounter.  No orders of the defined types were placed in this encounter.   Imaging: No results found.  PMFS History: Patient Active Problem List   Diagnosis Date Noted   Arthritis of left knee 07/17/2019   Asthma 06/12/2017   Hyperlipidemia 06/12/2017   Seizures ([place]) 06/12/2017   Seizure ([place]) 06/12/2017   GERD (gastroesophageal reflux  disease) 10/31/2016   Chronic rhinitis 10/31/2016   Chronic cholecystitis 04/12/2015   Preoperative clearance 03/17/2015   Spastic hemiplegia affecting nondominant side ([place]) 10/27/2013   Adhesive capsulitis of right shoulder 10/27/2013   Chronic diastolic heart failure ([place]) 08/26/2013   Dyslipidemia 08/26/2013   Borderline diabetes 08/26/2013   Cerebral infarction ([place]) 08/26/2013   Acute CVA (cerebrovascular accident) ([place]) 08/23/2013   Pre-syncope 08/17/2012   Hypokalemia 08/17/2012   ARTHRITIS 03/28/2010   DE QUERVAIN'S TENOSYNOVITIS 03/28/2010   Normocytic anemia 07/26/2009   Moderate persistent asthma 03/09/2009   DENTAL CARIES 03/09/2009   CANDIDIASIS OF VULVA AND VAGINA 06/04/2008   SKIN TAG 06/04/2008   ARTHRITIS, KNEES, BILATERAL 05/22/2008   HYPERTENSION, BENIGN ESSENTIAL 05/04/2008   KNEE PAIN, BILATERAL 05/04/2008   Cough, persistent 05/04/2008   MENISCUS TEAR, RIGHT 12/07/2005   Past Medical History:  Diagnosis Date   Arthritis    Asthma    Difficulty sleeping    Gallstones    History of kidney stones    Hyperlipidemia    Hypertension    Pneumonia    Preoperative clearance    Spasmodic cough 06/2017   "IT COMES IN DIFFERENT SEASONS"   Stroke ([place]) 2015   WEAKNESS L SIDE OF BODY     Family History  Problem Relation Age of Onset   Diabetes Mother    Colon cancer  Father 83   Colon polyps Neg Hx    Esophageal cancer Neg Hx    Rectal cancer Neg Hx    Stomach cancer Neg Hx     Past Surgical History:  Procedure Laterality Date   CHOLECYSTECTOMY N/A 04/12/2015   Procedure: LAPAROSCOPIC CHOLECYSTECTOMY WITH INTRAOPERATIVE CHOLANGIOGRAM;  Surgeon: Greer Pickerel, MD;  [facility];  Service: General;  Laterality: N/A;   gall stones     KNEE ARTHROSCOPY  2010   TOTAL KNEE ARTHROPLASTY Left 07/17/2019   Procedure: LEFT TOTAL KNEE ARTHROPLASTY;  Surgeon: Meredith Pel, MD;  Location: [facility];  Service: Orthopedics;  Laterality: Left;   TUBAL LIGATION      Social History   Occupational History   Not on file  Tobacco Use   Smoking status: Former    Types: Cigarettes    Quit date: 04/08/1994    Years since quitting: 26.5   Smokeless tobacco: Never  Vaping Use   Vaping Use: Never used  Substance and Sexual Activity   Alcohol use: No   Drug use: No   Sexual activity: Not on file

## 2020-11-12 ENCOUNTER — Ambulatory Visit: Payer: Self-pay

## 2020-11-12 ENCOUNTER — Ambulatory Visit: Payer: Medicare HMO | Admitting: Orthopedic Surgery

## 2020-11-12 ENCOUNTER — Other Ambulatory Visit: Payer: Self-pay

## 2020-11-12 DIAGNOSIS — M79671 Pain in right foot: Secondary | ICD-10-CM | POA: Diagnosis not present

## 2020-11-12 DIAGNOSIS — M25571 Pain in right ankle and joints of right foot: Secondary | ICD-10-CM

## 2020-11-14 ENCOUNTER — Encounter: Payer: Self-pay | Admitting: Orthopedic Surgery

## 2020-11-14 NOTE — Progress Notes (Signed)
Post-Op Visit Note   Patient: [name]           Date of Birth: 11-19-1950           MRN: [id number] Visit Date: 11/12/2020 PCP: Nolene Ebbs, MD   Assessment & Plan:  Chief Complaint:  Chief Complaint  Patient presents with   Right Foot - Follow-up   Visit Diagnoses:  1. Pain in right ankle and joints of right foot     Plan: Patient presents for evaluation of right foot and ankle pain.  She had total knee canceled because of ingrown toenail.  No history of injury to the right foot and ankle.  On exam the right foot and ankle has good ankle dorsiflexion plantarflexion inversion eversion strength with palpable intact nontender anterior to posterior to peroneal Achilles tendons.  No significant midfoot collapse with standing.  Range of motion maintained.  No pain with pronation supination of the forefoot.  Radiographs show arthritis but no significant malalignment or fracture.  Plan at this time is activity as tolerated on the right ankle.  Regarding the ingrown toenail, [name] presents for follow-up of right toe ingrown toenail.  This canceled her knee replacement.  The toe is healing well with no evidence of recurrent infection or issues.  We will repost her today for total knee replacement sometime in the near future.  No contraindication at this time to joint replacement.  Follow-Up Instructions: No follow-ups on file.   Orders:  Orders Placed This Encounter  Procedures   XR Ankle Complete Right   XR Foot Complete Right   No orders of the defined types were placed in this encounter.   Imaging: No results found.  PMFS History: Patient Active Problem List   Diagnosis Date Noted   Arthritis of left knee 07/17/2019   Asthma 06/12/2017   Hyperlipidemia 06/12/2017   Seizures ([place]) 06/12/2017   Seizure ([place]) 06/12/2017   GERD (gastroesophageal reflux disease) 10/31/2016   Chronic rhinitis 10/31/2016   Chronic cholecystitis 04/12/2015   Preoperative clearance 03/17/2015    Spastic hemiplegia affecting nondominant side ([place]) 10/27/2013   Adhesive capsulitis of right shoulder 10/27/2013   Chronic diastolic heart failure ([place]) 08/26/2013   Dyslipidemia 08/26/2013   Borderline diabetes 08/26/2013   Cerebral infarction ([place]) 08/26/2013   Acute CVA (cerebrovascular accident) ([place]) 08/23/2013   Pre-syncope 08/17/2012   Hypokalemia 08/17/2012   ARTHRITIS 03/28/2010   DE QUERVAIN'S TENOSYNOVITIS 03/28/2010   Normocytic anemia 07/26/2009   Moderate persistent asthma 03/09/2009   DENTAL CARIES 03/09/2009   CANDIDIASIS OF VULVA AND VAGINA 06/04/2008   SKIN TAG 06/04/2008   ARTHRITIS, KNEES, BILATERAL 05/22/2008   HYPERTENSION, BENIGN ESSENTIAL 05/04/2008   KNEE PAIN, BILATERAL 05/04/2008   Cough, persistent 05/04/2008   MENISCUS TEAR, RIGHT 12/07/2005   Past Medical History:  Diagnosis Date   Arthritis    Asthma    Difficulty sleeping    Gallstones    History of kidney stones    Hyperlipidemia    Hypertension    Pneumonia    Preoperative clearance    Spasmodic cough 06/2017   "IT COMES IN DIFFERENT SEASONS"   Stroke ([place]) 2015   WEAKNESS L SIDE OF BODY     Family History  Problem Relation Age of Onset   Diabetes Mother    Colon cancer Father 40   Colon polyps Neg Hx    Esophageal cancer Neg Hx    Rectal cancer Neg Hx    Stomach cancer Neg Hx  Past Surgical History:  Procedure Laterality Date   CHOLECYSTECTOMY N/A 04/12/2015   Procedure: LAPAROSCOPIC CHOLECYSTECTOMY WITH INTRAOPERATIVE CHOLANGIOGRAM;  Surgeon: Greer Pickerel, MD;  [facility];  Service: General;  Laterality: N/A;   gall stones     KNEE ARTHROSCOPY  2010   TOTAL KNEE ARTHROPLASTY Left 07/17/2019   Procedure: LEFT TOTAL KNEE ARTHROPLASTY;  Surgeon: Meredith Pel, MD;  Location: [facility];  Service: Orthopedics;  Laterality: Left;   TUBAL LIGATION     Social History   Occupational History   Not on file  Tobacco Use   Smoking status: Former    Types: Cigarettes     Quit date: 04/08/1994    Years since quitting: 26.6   Smokeless tobacco: Never  Vaping Use   Vaping Use: Never used  Substance and Sexual Activity   Alcohol use: No   Drug use: No   Sexual activity: Not on file

## 2020-11-17 ENCOUNTER — Encounter: Payer: Medicare HMO | Admitting: Orthopedic Surgery

## 2020-12-16 ENCOUNTER — Telehealth: Payer: Self-pay | Admitting: Orthopedic Surgery

## 2020-12-16 NOTE — Telephone Encounter (Signed)
Patient called asked if she can get something for pain for her right knee? Patient said she need a little something until she has surgery. The number to contact patient is [phone]

## 2020-12-17 ENCOUNTER — Other Ambulatory Visit: Payer: Self-pay | Admitting: Surgical

## 2020-12-17 MED ORDER — CELECOXIB 200 MG PO CAPS: 200.0000 mg | ORAL_CAPSULE | Freq: Every day | ORAL | 0 refills | Status: DC

## 2020-12-17 NOTE — Telephone Encounter (Signed)
Sent in celebrex for pain control. Stop this 7 days prior to her surgery

## 2020-12-20 NOTE — Telephone Encounter (Signed)
I called patient and advised. 

## 2020-12-27 NOTE — Pre-Procedure Instructions (Signed)
Surgical Instructions    Your procedure is scheduled on 01/04/21.  Report to [facility] Main Entrance "A" at 10:30 A.M., then check in with the Admitting office.  Call this number if you have problems the morning of surgery:  [phone]   If you have any questions prior to your surgery date call [phone]: Open Monday-Friday 8am-4pm    Remember:  Do not eat after midnight the night before your surgery  You may drink clear liquids until 09:30 the morning of your surgery.   Clear liquids allowed are: Water, Non-Citrus Juices (without pulp), Carbonated Beverages, Clear Tea, Black Coffee ONLY (NO MILK, CREAM OR POWDERED CREAMER of any kind), and Gatorade   Patient Instructions   The day of surgery (if you do NOT have diabetes):  Drink ONE (1) Pre-Surgery Clear Ensure by 09:30 the morning of surgery. Drink in one sitting. Do not sip.  This drink was given to you during your hospital  pre-op appointment visit.  Nothing else to drink after completing the  Pre-Surgery Clear Ensure.         If you have questions, please contact your surgeon's office.   Take these medicines the morning of surgery with A SIP OF WATER  albuterol (ACCUNEB) amLODipine (NORVASC) atorvastatin (LIPITOR) Fluticasone-Umeclidin-Vilant (TRELEGY ELLIPTA) methocarbamol (ROBAXIN)  Please bring all inhalers with you the day of surgery.   Please follow your surgeon's instructions for aspirin EC 81. If you have not received instructions, please contact your surgeon's office for blood thinner instructions.   As of today, STOP taking any Aspirin (unless otherwise instructed by your surgeon) Aleve, Naproxen, Ibuprofen, Motrin, Advil, Goody's, BC's, all herbal medications, fish oil, and all vitamins.   After your COVID test   You are not required to quarantine however you are required to wear a well-fitting mask when you are out and around people not in your household.  If your mask becomes wet or soiled, replace  with a new one.  Wash your hands often with soap and water for 20 seconds or clean your hands with an alcohol-based hand sanitizer that contains at least 60% alcohol.  Do not share personal items.  Notify your provider: if you are in close contact with someone who has COVID  or if you develop a fever of 100.4 or greater, sneezing, cough, sore throat, shortness of breath or body aches.           Do not wear jewelry or makeup Do not wear lotions, powders, perfumes/colognes, or deodorant. Do not shave 48 hours prior to surgery.  Men may shave face and neck. Do not bring valuables to the hospital. DO Not wear nail polish, gel polish, artificial nails, or any other type of covering on natural nails including finger and toenails. If patients have artificial nails, gel coating, etc. that need to be removed by a nail salon, please have this removed prior to surgery or surgery may need to be canceled/delayed if the surgeon/ anesthesia feels like the patient is unable to be adequately monitored.             [facility] is not responsible for any belongings or valuables.  Do NOT Smoke (Tobacco/Vaping)  24 hours prior to your procedure  If you use a CPAP at night, you may bring your mask for your overnight stay.   Contacts, glasses, hearing aids, dentures or partials may not be worn into surgery, please bring cases for these belongings   For patients admitted to the hospital, discharge  time will be determined by your treatment team.   Patients discharged the day of surgery will not be allowed to drive home, and someone needs to stay with them for 24 hours.  NO VISITORS WILL BE ALLOWED IN PRE-OP WHERE PATIENTS ARE PREPPED FOR SURGERY.  ONLY 1 SUPPORT PERSON MAY BE PRESENT IN THE WAITING ROOM WHILE YOU ARE IN SURGERY.  IF YOU ARE TO BE ADMITTED, ONCE YOU ARE IN YOUR ROOM YOU WILL BE ALLOWED TWO (2) VISITORS. 1 (ONE) VISITOR MAY STAY OVERNIGHT BUT MUST ARRIVE TO THE ROOM BY 8pm.  Minor children may  have two parents present. Special consideration for safety and communication needs will be reviewed on a case by case basis.  Special instructions:    Oral Hygiene is also important to reduce your risk of infection.  Remember - BRUSH YOUR TEETH THE MORNING OF SURGERY WITH YOUR REGULAR TOOTHPASTE   [place]- Preparing For Surgery  Before surgery, you can play an important role. Because skin is not sterile, your skin needs to be as free of germs as possible. You can reduce the number of germs on your skin by washing with CHG (chlorahexidine gluconate) Soap before surgery.  CHG is an antiseptic cleaner which kills germs and bonds with the skin to continue killing germs even after washing.     Please do not use if you have an allergy to CHG or antibacterial soaps. If your skin becomes reddened/irritated stop using the CHG.  Do not shave (including legs and underarms) for at least 48 hours prior to first CHG shower. It is OK to shave your face.  Please follow these instructions carefully.     Shower the NIGHT BEFORE SURGERY and the MORNING OF SURGERY with CHG Soap.   If you chose to wash your hair, wash your hair first as usual with your normal shampoo. After you shampoo, rinse your hair and body thoroughly to remove the shampoo.  Then [employer] and genitals (private parts) with your normal soap and rinse thoroughly to remove soap.  After that Use CHG Soap as you would any other liquid soap. You can apply CHG directly to the skin and wash gently with a scrungie or a clean washcloth.   Apply the CHG Soap to your body ONLY FROM THE NECK DOWN.  Do not use on open wounds or open sores. Avoid contact with your eyes, ears, mouth and genitals (private parts). Wash Face and genitals (private parts)  with your normal soap.   Wash thoroughly, paying special attention to the area where your surgery will be performed.  Thoroughly rinse your body with warm water from the neck down.  DO NOT shower/wash  with your normal soap after using and rinsing off the CHG Soap.  Pat yourself dry with a CLEAN TOWEL.  Wear CLEAN PAJAMAS to bed the night before surgery  Place CLEAN SHEETS on your bed the night before your surgery  DO NOT SLEEP WITH PETS.   Day of Surgery:  Take a shower with CHG soap. Wear Clean/Comfortable clothing the morning of surgery Do not apply any deodorants/lotions.   Remember to brush your teeth WITH YOUR REGULAR TOOTHPASTE.   Please read over the following fact sheets that you were given.

## 2020-12-28 ENCOUNTER — Encounter (HOSPITAL_COMMUNITY): Payer: Self-pay

## 2020-12-28 ENCOUNTER — Other Ambulatory Visit: Payer: Self-pay

## 2020-12-28 ENCOUNTER — Encounter (HOSPITAL_COMMUNITY)
Admission: RE | Admit: 2020-12-28 | Discharge: 2020-12-28 | Disposition: A | Discharge status: Home / Self Care | Payer: Medicare HMO | Source: Ambulatory Visit | Attending: Orthopedic Surgery | Admitting: Orthopedic Surgery

## 2020-12-28 VITALS — BP 136/81 | HR 83 | Temp 98.3°F | Resp 18 | Ht 63.0 in | Wt 229.0 lb

## 2020-12-28 DIAGNOSIS — Z01818 Encounter for other preprocedural examination: Secondary | ICD-10-CM

## 2020-12-28 DIAGNOSIS — Z01812 Encounter for preprocedural laboratory examination: Secondary | ICD-10-CM | POA: Diagnosis present

## 2020-12-28 LAB — BASIC METABOLIC PANEL
Anion gap: 9 (ref 5–15)
BUN: 9 mg/dL (ref 8–23)
CO2: 29 mmol/L (ref 22–32)
Calcium: 9.3 mg/dL (ref 8.9–10.3)
Chloride: 101 mmol/L (ref 98–111)
Creatinine, Ser: 0.68 mg/dL (ref 0.44–1.00)
GFR, Estimated: >60 mL/min (ref >60)
Glucose, Bld: 100 mg/dL — ABNORMAL HIGH (ref 70–99)
Potassium: 3.7 mmol/L (ref 3.5–5.1)
Sodium: 139 mmol/L (ref 135–145)

## 2020-12-28 LAB — URINALYSIS, ROUTINE W REFLEX MICROSCOPIC
Bilirubin Urine: NEGATIVE
Glucose, UA: NEGATIVE mg/dL
Hgb urine dipstick: NEGATIVE
Ketones, ur: NEGATIVE mg/dL
Leukocytes,Ua: NEGATIVE
Nitrite: NEGATIVE
Protein, ur: NEGATIVE mg/dL
Specific Gravity, Urine: 1.017 (ref 1.005–1.030)
pH: 6 (ref 5.0–8.0)

## 2020-12-28 LAB — CBC
HCT: 37.9 % (ref 36.0–46.0)
Hemoglobin: 12.1 g/dL (ref 12.0–15.0)
MCH: 28.4 pg (ref 26.0–34.0)
MCHC: 31.9 g/dL (ref 30.0–36.0)
MCV: 89 fL (ref 80.0–100.0)
Platelets: 269 10*3/uL (ref 150–400)
RBC: 4.26 MIL/uL (ref 3.87–5.11)
RDW: 13.8 % (ref 11.5–15.5)
WBC: 5.2 10*3/uL (ref 4.0–10.5)
nRBC: 0 % (ref 0.0–0.2)

## 2020-12-28 LAB — SURGICAL PCR SCREEN
MRSA, PCR: NEGATIVE
Staphylococcus aureus: NEGATIVE

## 2020-12-28 NOTE — Progress Notes (Signed)
PCP: Nolene Ebbs, MD Cardiologist: denies  EKG: 03/25/20 CXR: na ECHO: 06/24/13 Stress Test: denies Cardiac Cath: denies  Fasting Blood Sugar- na Checks Blood Sugar__na_ times a day  ASA: Last dose 12/28/20 Blood Thinner: No  OSA/CPAP: No  Covid test scheduled 12/31/20 at 1010  Anesthesia Review: No  Patient denies shortness of breath, fever, cough, and chest pain at PAT appointment.  Patient verbalized understanding of instructions provided today at the PAT appointment.  Patient asked to review instructions at home and day of surgery.

## 2020-12-31 ENCOUNTER — Other Ambulatory Visit (HOSPITAL_COMMUNITY)
Admission: RE | Admit: 2020-12-31 | Discharge: 2020-12-31 | Disposition: A | Discharge status: Home / Self Care | Payer: Medicare HMO | Source: Ambulatory Visit | Attending: Orthopedic Surgery | Admitting: Orthopedic Surgery

## 2020-12-31 DIAGNOSIS — Z20822 Contact with and (suspected) exposure to covid-19: Secondary | ICD-10-CM | POA: Insufficient documentation

## 2020-12-31 DIAGNOSIS — Z01812 Encounter for preprocedural laboratory examination: Secondary | ICD-10-CM | POA: Diagnosis present

## 2020-12-31 DIAGNOSIS — Z01818 Encounter for other preprocedural examination: Secondary | ICD-10-CM

## 2020-12-31 DIAGNOSIS — M1711 Unilateral primary osteoarthritis, right knee: Secondary | ICD-10-CM | POA: Insufficient documentation

## 2020-12-31 LAB — URINE CULTURE: Culture: 60000 — AB

## 2020-12-31 LAB — SARS CORONAVIRUS 2 (TAT 6-24 HRS): SARS Coronavirus 2: NEGATIVE

## 2021-01-03 MED ORDER — TRANEXAMIC ACID 1000 MG/10ML IV SOLN
2000.0000 mg | INTRAVENOUS | Status: AC
  Administered 2021-01-04: 2000 mg via TOPICAL
  Filled 2021-01-03: qty 20

## 2021-01-04 ENCOUNTER — Ambulatory Visit (HOSPITAL_COMMUNITY): Payer: Medicare HMO | Admitting: Physician Assistant

## 2021-01-04 ENCOUNTER — Other Ambulatory Visit: Payer: Self-pay

## 2021-01-04 ENCOUNTER — Ambulatory Visit (HOSPITAL_COMMUNITY): Payer: Medicare HMO | Admitting: Certified Registered Nurse Anesthetist

## 2021-01-04 ENCOUNTER — Encounter (HOSPITAL_COMMUNITY)
Admission: RE | Disposition: A | Discharge status: Home / Self Care | Payer: Self-pay | Source: Home / Self Care | Attending: Orthopedic Surgery

## 2021-01-04 ENCOUNTER — Observation Stay (HOSPITAL_COMMUNITY)
Admission: RE | Admit: 2021-01-04 | Discharge: 2021-01-06 | Disposition: A | Discharge status: Home / Self Care | Payer: Medicare HMO | Attending: Orthopedic Surgery | Admitting: Orthopedic Surgery

## 2021-01-04 ENCOUNTER — Encounter (HOSPITAL_COMMUNITY): Payer: Self-pay | Admitting: Orthopedic Surgery

## 2021-01-04 DIAGNOSIS — Z20822 Contact with and (suspected) exposure to covid-19: Secondary | ICD-10-CM | POA: Insufficient documentation

## 2021-01-04 DIAGNOSIS — I5032 Chronic diastolic (congestive) heart failure: Secondary | ICD-10-CM | POA: Diagnosis not present

## 2021-01-04 DIAGNOSIS — Z96652 Presence of left artificial knee joint: Secondary | ICD-10-CM | POA: Diagnosis not present

## 2021-01-04 DIAGNOSIS — R7303 Prediabetes: Secondary | ICD-10-CM | POA: Insufficient documentation

## 2021-01-04 DIAGNOSIS — G8929 Other chronic pain: Secondary | ICD-10-CM | POA: Diagnosis not present

## 2021-01-04 DIAGNOSIS — J45909 Unspecified asthma, uncomplicated: Secondary | ICD-10-CM | POA: Insufficient documentation

## 2021-01-04 DIAGNOSIS — M1711 Unilateral primary osteoarthritis, right knee: Principal | ICD-10-CM

## 2021-01-04 DIAGNOSIS — Z8673 Personal history of transient ischemic attack (TIA), and cerebral infarction without residual deficits: Secondary | ICD-10-CM | POA: Diagnosis not present

## 2021-01-04 DIAGNOSIS — I110 Hypertensive heart disease with heart failure: Secondary | ICD-10-CM | POA: Diagnosis not present

## 2021-01-04 DIAGNOSIS — Z01818 Encounter for other preprocedural examination: Secondary | ICD-10-CM

## 2021-01-04 DIAGNOSIS — Z8669 Personal history of other diseases of the nervous system and sense organs: Secondary | ICD-10-CM | POA: Insufficient documentation

## 2021-01-04 DIAGNOSIS — M179 Osteoarthritis of knee, unspecified: Secondary | ICD-10-CM | POA: Diagnosis present

## 2021-01-04 DIAGNOSIS — Z79899 Other long term (current) drug therapy: Secondary | ICD-10-CM | POA: Insufficient documentation

## 2021-01-04 DIAGNOSIS — Z87891 Personal history of nicotine dependence: Secondary | ICD-10-CM | POA: Insufficient documentation

## 2021-01-04 DIAGNOSIS — Z96651 Presence of right artificial knee joint: Secondary | ICD-10-CM

## 2021-01-04 HISTORY — PX: TOTAL KNEE ARTHROPLASTY: SHX125

## 2021-01-04 SURGERY — ARTHROPLASTY, KNEE, TOTAL
Anesthesia: Regional | Site: Knee | Laterality: Right

## 2021-01-04 MED ORDER — TRANEXAMIC ACID-NACL 1000-0.7 MG/100ML-% IV SOLN
1000.0000 mg | INTRAVENOUS | Status: AC
  Administered 2021-01-04: 1000 mg via INTRAVENOUS

## 2021-01-04 MED ORDER — ACETAMINOPHEN 10 MG/ML IV SOLN
1000.0000 mg | Freq: Once | INTRAVENOUS | Status: DC | PRN
  Administered 2021-01-04: 1000 mg via INTRAVENOUS

## 2021-01-04 MED ORDER — ACETAMINOPHEN 10 MG/ML IV SOLN
INTRAVENOUS | Status: AC
  Filled 2021-01-04: qty 100

## 2021-01-04 MED ORDER — CLONIDINE HCL (ANALGESIA) 100 MCG/ML EP SOLN
EPIDURAL | Status: AC
  Filled 2021-01-04: qty 10

## 2021-01-04 MED ORDER — VANCOMYCIN HCL 1000 MG IV SOLR
INTRAVENOUS | Status: DC | PRN
  Administered 2021-01-04: 1000 mg via TOPICAL

## 2021-01-04 MED ORDER — BUPIVACAINE IN DEXTROSE 0.75-8.25 % IT SOLN
INTRATHECAL | Status: DC | PRN
  Administered 2021-01-04: 2 mL via INTRATHECAL

## 2021-01-04 MED ORDER — FENTANYL CITRATE (PF) 100 MCG/2ML IJ SOLN
25.0000 ug | INTRAMUSCULAR | Status: DC | PRN
Start: 2021-01-04 — End: 2021-01-05
  Administered 2021-01-04 – 2021-01-05 (×3): 50 ug via INTRAVENOUS

## 2021-01-04 MED ORDER — PHENYLEPHRINE HCL (PRESSORS) 10 MG/ML IV SOLN
INTRAVENOUS | Status: DC | PRN
  Administered 2021-01-04: 160 ug via INTRAVENOUS

## 2021-01-04 MED ORDER — POVIDONE-IODINE 7.5 % EX SOLN
Freq: Once | CUTANEOUS | Status: DC
  Filled 2021-01-04: qty 118

## 2021-01-04 MED ORDER — BUPIVACAINE HCL (PF) 0.25 % IJ SOLN
INTRAMUSCULAR | Status: AC
  Filled 2021-01-04: qty 30

## 2021-01-04 MED ORDER — PHENYLEPHRINE 40 MCG/ML (10ML) SYRINGE FOR IV PUSH (FOR BLOOD PRESSURE SUPPORT)
PREFILLED_SYRINGE | INTRAVENOUS | Status: AC
  Filled 2021-01-04: qty 10

## 2021-01-04 MED ORDER — FENTANYL CITRATE (PF) 100 MCG/2ML IJ SOLN
INTRAMUSCULAR | Status: AC
  Filled 2021-01-04: qty 2

## 2021-01-04 MED ORDER — ONDANSETRON HCL 4 MG/2ML IJ SOLN
INTRAMUSCULAR | Status: AC
  Filled 2021-01-04: qty 2

## 2021-01-04 MED ORDER — BUPIVACAINE-EPINEPHRINE (PF) 0.5% -1:200000 IJ SOLN
INTRAMUSCULAR | Status: DC | PRN
  Administered 2021-01-04: 30 mL

## 2021-01-04 MED ORDER — 0.9 % SODIUM CHLORIDE (POUR BTL) OPTIME
TOPICAL | Status: DC | PRN
  Administered 2021-01-04: 4000 mL

## 2021-01-04 MED ORDER — MORPHINE SULFATE (PF) 4 MG/ML IV SOLN
INTRAVENOUS | Status: AC
  Filled 2021-01-04: qty 2

## 2021-01-04 MED ORDER — BUPIVACAINE-EPINEPHRINE (PF) 0.5% -1:200000 IJ SOLN
INTRAMUSCULAR | Status: DC | PRN
  Administered 2021-01-04: 30 mL via PERINEURAL

## 2021-01-04 MED ORDER — POVIDONE-IODINE 10 % EX SWAB
2.0000 "application " | Freq: Once | CUTANEOUS | Status: AC
  Administered 2021-01-04: 2 via TOPICAL

## 2021-01-04 MED ORDER — CEFAZOLIN SODIUM-DEXTROSE 2-4 GM/100ML-% IV SOLN
INTRAVENOUS | Status: AC
  Filled 2021-01-04: qty 100

## 2021-01-04 MED ORDER — PROPOFOL 1000 MG/100ML IV EMUL
INTRAVENOUS | Status: AC
  Filled 2021-01-04: qty 100

## 2021-01-04 MED ORDER — ALBUMIN HUMAN 5 % IV SOLN
12.5000 g | Freq: Once | INTRAVENOUS | Status: AC
  Administered 2021-01-04: 12.5 g via INTRAVENOUS

## 2021-01-04 MED ORDER — PHENYLEPHRINE 40 MCG/ML (10ML) SYRINGE FOR IV PUSH (FOR BLOOD PRESSURE SUPPORT)
PREFILLED_SYRINGE | INTRAVENOUS | Status: DC | PRN
  Administered 2021-01-04 (×4): 80 ug via INTRAVENOUS
  Administered 2021-01-04: 160 ug via INTRAVENOUS
  Administered 2021-01-04: 80 ug via INTRAVENOUS

## 2021-01-04 MED ORDER — VANCOMYCIN HCL 1000 MG IV SOLR
INTRAVENOUS | Status: AC
  Filled 2021-01-04: qty 20

## 2021-01-04 MED ORDER — CEFAZOLIN SODIUM-DEXTROSE 2-4 GM/100ML-% IV SOLN
2.0000 g | INTRAVENOUS | Status: AC
  Administered 2021-01-04: 2 g via INTRAVENOUS

## 2021-01-04 MED ORDER — BUPIVACAINE LIPOSOME 1.3 % IJ SUSP
INTRAMUSCULAR | Status: DC | PRN
  Administered 2021-01-04: 20 mL

## 2021-01-04 MED ORDER — PROPOFOL 10 MG/ML IV BOLUS
INTRAVENOUS | Status: DC | PRN
  Administered 2021-01-04: 20 mg via INTRAVENOUS
  Administered 2021-01-04: 40 mg via INTRAVENOUS
  Administered 2021-01-04 (×2): 20 mg via INTRAVENOUS
  Administered 2021-01-04 (×2): 40 mg via INTRAVENOUS
  Administered 2021-01-04: 20 mg via INTRAVENOUS
  Administered 2021-01-04: 50 mg via INTRAVENOUS
  Administered 2021-01-04: 30 mg via INTRAVENOUS

## 2021-01-04 MED ORDER — SODIUM CHLORIDE 0.9% FLUSH
INTRAVENOUS | Status: DC | PRN
  Administered 2021-01-04: 20 mL

## 2021-01-04 MED ORDER — IRRISEPT - 450ML BOTTLE WITH 0.05% CHG IN STERILE WATER, USP 99.95% OPTIME
TOPICAL | Status: DC | PRN
  Administered 2021-01-04: 450 mL

## 2021-01-04 MED ORDER — FENTANYL CITRATE (PF) 250 MCG/5ML IJ SOLN
INTRAMUSCULAR | Status: AC
  Filled 2021-01-04: qty 5

## 2021-01-04 MED ORDER — FLUTICASONE-UMECLIDIN-VILANT 200-62.5-25 MCG/ACT IN AEPB: 1.0000 | INHALATION_SPRAY | Freq: Three times a day (TID) | RESPIRATORY_TRACT | Status: DC | PRN

## 2021-01-04 MED ORDER — ALBUMIN HUMAN 5 % IV SOLN
INTRAVENOUS | Status: AC
  Filled 2021-01-04: qty 250

## 2021-01-04 MED ORDER — CHLORHEXIDINE GLUCONATE 0.12 % MT SOLN
15.0000 mL | Freq: Once | OROMUCOSAL | Status: AC
  Administered 2021-01-04: 15 mL via OROMUCOSAL

## 2021-01-04 MED ORDER — BUPIVACAINE LIPOSOME 1.3 % IJ SUSP
INTRAMUSCULAR | Status: AC
  Filled 2021-01-04: qty 20

## 2021-01-04 MED ORDER — ONDANSETRON HCL 4 MG/2ML IJ SOLN
INTRAMUSCULAR | Status: DC | PRN
  Administered 2021-01-04: 4 mg via INTRAVENOUS

## 2021-01-04 MED ORDER — MIDAZOLAM HCL 2 MG/2ML IJ SOLN
INTRAMUSCULAR | Status: AC
  Filled 2021-01-04: qty 2

## 2021-01-04 MED ORDER — MECLIZINE HCL 25 MG PO TABS
25.0000 mg | ORAL_TABLET | Freq: Three times a day (TID) | ORAL | Status: DC | PRN
  Filled 2021-01-04: qty 1

## 2021-01-04 MED ORDER — CHLORHEXIDINE GLUCONATE 0.12 % MT SOLN
OROMUCOSAL | Status: AC
  Filled 2021-01-04: qty 15

## 2021-01-04 MED ORDER — CLONIDINE HCL (ANALGESIA) 100 MCG/ML EP SOLN
EPIDURAL | Status: DC | PRN
  Administered 2021-01-04: 2 mL

## 2021-01-04 MED ORDER — PROPOFOL 500 MG/50ML IV EMUL
INTRAVENOUS | Status: DC | PRN
  Administered 2021-01-04: 100 ug/kg/min via INTRAVENOUS

## 2021-01-04 MED ORDER — TRANEXAMIC ACID-NACL 1000-0.7 MG/100ML-% IV SOLN
INTRAVENOUS | Status: AC
  Filled 2021-01-04: qty 100

## 2021-01-04 MED ORDER — SODIUM CHLORIDE 0.9 % IR SOLN
Status: DC | PRN
  Administered 2021-01-04: 3000 mL

## 2021-01-04 MED ORDER — ORAL CARE MOUTH RINSE: 15.0000 mL | Freq: Once | OROMUCOSAL | Status: AC

## 2021-01-04 MED ORDER — AMLODIPINE BESYLATE 5 MG PO TABS
5.0000 mg | ORAL_TABLET | Freq: Every day | ORAL | Status: DC
  Administered 2021-01-05 – 2021-01-06 (×2): 5 mg via ORAL
  Filled 2021-01-04 (×2): qty 1

## 2021-01-04 MED ORDER — AMISULPRIDE (ANTIEMETIC) 5 MG/2ML IV SOLN: 10.0000 mg | Freq: Once | INTRAVENOUS | Status: DC | PRN

## 2021-01-04 MED ORDER — PHENYLEPHRINE HCL-NACL 20-0.9 MG/250ML-% IV SOLN
INTRAVENOUS | Status: DC | PRN
  Administered 2021-01-04: 100 ug/min via INTRAVENOUS

## 2021-01-04 MED ORDER — ALBUTEROL SULFATE (2.5 MG/3ML) 0.083% IN NEBU
2.5000 mg | INHALATION_SOLUTION | Freq: Two times a day (BID) | RESPIRATORY_TRACT | Status: DC
  Administered 2021-01-05: 2.5 mg via RESPIRATORY_TRACT
  Filled 2021-01-04: qty 3

## 2021-01-04 MED ORDER — LACTATED RINGERS IV SOLN: INTRAVENOUS | Status: DC

## 2021-01-04 MED ORDER — MORPHINE SULFATE (PF) 4 MG/ML IV SOLN
INTRAVENOUS | Status: DC | PRN
  Administered 2021-01-04: 8 mg via INTRAVENOUS

## 2021-01-04 MED ORDER — FENTANYL CITRATE (PF) 100 MCG/2ML IJ SOLN
50.0000 ug | Freq: Once | INTRAMUSCULAR | Status: AC
  Administered 2021-01-04: 50 ug via INTRAVENOUS

## 2021-01-04 MED ORDER — ONDANSETRON HCL 4 MG/2ML IJ SOLN: 4.0000 mg | Freq: Once | INTRAMUSCULAR | Status: DC | PRN

## 2021-01-04 MED ORDER — PROPOFOL 10 MG/ML IV BOLUS
INTRAVENOUS | Status: AC
  Filled 2021-01-04: qty 20

## 2021-01-04 MED ORDER — FENTANYL CITRATE (PF) 250 MCG/5ML IJ SOLN
INTRAMUSCULAR | Status: DC | PRN
  Administered 2021-01-04 (×2): 25 ug via INTRAVENOUS

## 2021-01-04 SURGICAL SUPPLY — 83 items
BAG COUNTER SPONGE SURGICOUNT (BAG) ×2 IMPLANT
BAG DECANTER FOR FLEXI CONT (MISCELLANEOUS) ×2 IMPLANT
BAG SPNG CNTER NS LX DISP (BAG) ×1
BANDAGE ESMARK 6X9 LF (GAUZE/BANDAGES/DRESSINGS) ×1 IMPLANT
BLADE SAG 18X100X1.27 (BLADE) ×2 IMPLANT
BLADE SAGITTAL (BLADE) ×2
BLADE SAW THK.89X75X18XSGTL (BLADE) ×1 IMPLANT
BNDG CMPR 9X6 STRL LF SNTH (GAUZE/BANDAGES/DRESSINGS) ×1
BNDG CMPR MED 15X6 ELC VLCR LF (GAUZE/BANDAGES/DRESSINGS) ×1
BNDG COHESIVE 6X5 TAN STRL LF (GAUZE/BANDAGES/DRESSINGS) ×2 IMPLANT
BNDG ELASTIC 6X15 VLCR STRL LF (GAUZE/BANDAGES/DRESSINGS) ×2 IMPLANT
BNDG ESMARK 6X9 LF (GAUZE/BANDAGES/DRESSINGS) ×2
BOWL SMART MIX CTS (DISPOSABLE) IMPLANT
BSPLAT TIB 5D C 16NT STM RT (Knees) ×1 IMPLANT
CEMENT BONE SIMPLEX SPEEDSET (Cement) ×2 IMPLANT
CNTNR URN SCR LID CUP LEK RST (MISCELLANEOUS) ×1 IMPLANT
COMP FEM CEMT PERS SZ7 RT (Joint) ×2 IMPLANT
COMPONENT FEM CEMT PERS SZ7 RT (Joint) IMPLANT
CONT SPEC 4OZ STRL OR WHT (MISCELLANEOUS) ×2
COVER SURGICAL LIGHT HANDLE (MISCELLANEOUS) ×2 IMPLANT
CUFF TOURN SGL QUICK 34 (TOURNIQUET CUFF) ×2
CUFF TRNQT CYL 34X4.125X (TOURNIQUET CUFF) ×1 IMPLANT
DECANTER SPIKE VIAL GLASS SM (MISCELLANEOUS) ×2 IMPLANT
DRAPE INCISE IOBAN 66X45 STRL (DRAPES) ×1 IMPLANT
DRAPE ORTHO SPLIT 77X108 STRL (DRAPES) ×4
DRAPE SURG ORHT 6 SPLT 77X108 (DRAPES) ×3 IMPLANT
DRAPE U-SHAPE 47X51 STRL (DRAPES) ×2 IMPLANT
DRSG AQUACEL AG ADV 3.5X14 (GAUZE/BANDAGES/DRESSINGS) ×1 IMPLANT
DURAPREP 26ML APPLICATOR (WOUND CARE) ×4 IMPLANT
ELECT CAUTERY BLADE 6.4 (BLADE) ×2 IMPLANT
ELECT REM PT RETURN 9FT ADLT (ELECTROSURGICAL) ×2
ELECTRODE REM PT RTRN 9FT ADLT (ELECTROSURGICAL) ×1 IMPLANT
GAUZE SPONGE 4X4 12PLY STRL (GAUZE/BANDAGES/DRESSINGS) ×2 IMPLANT
GLOVE SRG 8 PF TXTR STRL LF DI (GLOVE) ×1 IMPLANT
GLOVE SURG LTX SZ7 (GLOVE) ×2 IMPLANT
GLOVE SURG LTX SZ8 (GLOVE) ×2 IMPLANT
GLOVE SURG UNDER POLY LF SZ7 (GLOVE) ×2 IMPLANT
GLOVE SURG UNDER POLY LF SZ8 (GLOVE) ×2
GOWN STRL REUS W/ TWL LRG LVL3 (GOWN DISPOSABLE) ×3 IMPLANT
GOWN STRL REUS W/TWL LRG LVL3 (GOWN DISPOSABLE) ×6
HANDPIECE INTERPULSE COAX TIP (DISPOSABLE) ×2
HDLS TROCR DRIL PIN KNEE 75 (PIN) ×2
HOOD PEEL AWAY FLYTE STAYCOOL (MISCELLANEOUS) ×8 IMPLANT
IMMOBILIZER KNEE 20 (SOFTGOODS)
IMMOBILIZER KNEE 20 THIGH 36 (SOFTGOODS) IMPLANT
IMMOBILIZER KNEE 22 UNIV (SOFTGOODS) ×1 IMPLANT
IMMOBILIZER KNEE 24 THIGH 36 (MISCELLANEOUS) IMPLANT
IMMOBILIZER KNEE 24 UNIV (MISCELLANEOUS)
INSERT TIB ASF PS CD/6-7 RT 13 (Insert) ×1 IMPLANT
KIT BASIN OR (CUSTOM PROCEDURE TRAY) ×2 IMPLANT
KIT TURNOVER KIT B (KITS) ×2 IMPLANT
MANIFOLD NEPTUNE II (INSTRUMENTS) ×2 IMPLANT
NDL SPNL 18GX3.5 QUINCKE PK (NEEDLE) ×1 IMPLANT
NEEDLE 22X1 1/2 (OR ONLY) (NEEDLE) ×4 IMPLANT
NEEDLE SPNL 18GX3.5 QUINCKE PK (NEEDLE) ×2 IMPLANT
NS IRRIG 1000ML POUR BTL (IV SOLUTION) ×5 IMPLANT
PACK TOTAL JOINT (CUSTOM PROCEDURE TRAY) ×2 IMPLANT
PAD ARMBOARD 7.5X6 YLW CONV (MISCELLANEOUS) ×4 IMPLANT
PAD CAST 4YDX4 CTTN HI CHSV (CAST SUPPLIES) ×1 IMPLANT
PADDING CAST COTTON 4X4 STRL (CAST SUPPLIES) ×2
PADDING CAST COTTON 6X4 STRL (CAST SUPPLIES) ×2 IMPLANT
PIN DRILL HDLS TROCAR 75 4PK (PIN) IMPLANT
SCREW FEMALE HEX FIX 25X2.5 (ORTHOPEDIC DISPOSABLE SUPPLIES) ×1 IMPLANT
SET HNDPC FAN SPRY TIP SCT (DISPOSABLE) ×1 IMPLANT
STEM POLY PAT PLY 32M KNEE (Knees) ×1 IMPLANT
STEM TIBIA 5 DEG SZ C R KNEE (Knees) IMPLANT
STRIP CLOSURE SKIN 1/2X4 (GAUZE/BANDAGES/DRESSINGS) ×3 IMPLANT
SUCTION FRAZIER HANDLE 10FR (MISCELLANEOUS) ×2
SUCTION TUBE FRAZIER 10FR DISP (MISCELLANEOUS) ×1 IMPLANT
SUT MNCRL AB 3-0 PS2 18 (SUTURE) ×2 IMPLANT
SUT VIC AB 0 CT1 27 (SUTURE) ×6
SUT VIC AB 0 CT1 27XBRD ANBCTR (SUTURE) ×3 IMPLANT
SUT VIC AB 1 CT1 36 (SUTURE) ×12 IMPLANT
SUT VIC AB 2-0 CT1 27 (SUTURE) ×10
SUT VIC AB 2-0 CT1 TAPERPNT 27 (SUTURE) ×4 IMPLANT
SYR 30ML LL (SYRINGE) ×6 IMPLANT
SYR TB 1ML LUER SLIP (SYRINGE) ×2 IMPLANT
TIBIA STEM 5 DEG SZ C R KNEE (Knees) ×2 IMPLANT
TOWEL GREEN STERILE (TOWEL DISPOSABLE) ×4 IMPLANT
TOWEL GREEN STERILE FF (TOWEL DISPOSABLE) ×4 IMPLANT
TRAY CATH 16FR W/PLASTIC CATH (SET/KITS/TRAYS/PACK) IMPLANT
WATER STERILE IRR 1000ML POUR (IV SOLUTION) IMPLANT
YANKAUER SUCT BULB TIP NO VENT (SUCTIONS) ×2 IMPLANT

## 2021-01-04 NOTE — Progress Notes (Signed)
Dr. Marlou Sa is aware of urine culture results.  No new orders received.

## 2021-01-04 NOTE — Transfer of Care (Signed)
Immediate Anesthesia Transfer of Care Note  Patient: [name]  Procedure(s) Performed: RIGHT TOTAL KNEE ARTHROPLASTY (Right: Knee)  Patient Location: PACU  Anesthesia Type:General and Regional  Level of Consciousness: awake, alert  and oriented  Airway & Oxygen Therapy: Patient Spontanous Breathing and Patient connected to face mask oxygen  Post-op Assessment: Report given to RN and Post -op Vital signs reviewed and stable  Post vital signs: Reviewed and stable  Last Vitals:  Vitals Value Taken Time  BP 110/81 01/04/21 1642  Temp    Pulse 93 01/04/21 1643  Resp 22 01/04/21 1643  SpO2 94 % 01/04/21 1643  Vitals shown include unvalidated device data.  Last Pain:  Vitals:   01/04/21 1215  TempSrc:   PainSc: 0-No pain      Patients Stated Pain Goal: 0 (91/02/89 0228)  Complications: No notable events documented.

## 2021-01-04 NOTE — Anesthesia Postprocedure Evaluation (Signed)
Anesthesia Post Note  Patient: [name]  Procedure(s) Performed: RIGHT TOTAL KNEE ARTHROPLASTY (Right: Knee)     Anesthesia Type: Regional, Spinal and MAC Level of consciousness: awake and alert, oriented and patient cooperative Pain management: pain level controlled Vital Signs Assessment: post-procedure vital signs reviewed and stable Respiratory status: spontaneous breathing, nonlabored ventilation and respiratory function stable Cardiovascular status: blood pressure returned to baseline and stable Postop Assessment: no apparent nausea or vomiting, spinal receding, no headache and no backache Anesthetic complications: no   No notable events documented.  Last Vitals:  Vitals:   01/04/21 1711 01/04/21 1726  BP: (!) 88/66 104/67  Pulse: 73 78  Resp: 12 15  Temp:    SpO2: 93% 93%    Last Pain:  Vitals:   01/04/21 1711  TempSrc:   PainSc: [place]

## 2021-01-04 NOTE — Anesthesia Preprocedure Evaluation (Addendum)
Anesthesia Evaluation  Patient identified by MRN, date of birth, ID band Patient awake    Reviewed: Allergy & Precautions, NPO status , Patient's Chart, lab work & pertinent test results  Airway Mallampati: III  TM Distance: >3 FB Neck ROM: Full    Dental  (+) Edentulous Upper, Edentulous Lower   Pulmonary asthma , former smoker,    Pulmonary exam normal breath sounds clear to auscultation       Cardiovascular hypertension, Pt. on medications Normal cardiovascular exam Rhythm:Regular Rate:Normal  ECG: SR, rate 88   Neuro/Psych Seizures -,  CVA, Residual Symptoms negative psych ROS   GI/Hepatic negative GI ROS, Neg liver ROS,   Endo/Other  Morbid obesity  Renal/GU negative Renal ROS     Musculoskeletal  (+) Arthritis ,   Abdominal (+) + obese,   Peds  Hematology HLD   Anesthesia Other Findings Right knee osteoarthritis  Reproductive/Obstetrics                            Anesthesia Physical Anesthesia Plan  ASA: 3  Anesthesia Plan: Regional and Spinal   Post-op Pain Management:    Induction: Intravenous  PONV Risk Score and Plan: 2 and Ondansetron, Dexamethasone, Propofol infusion and Treatment may vary due to age or medical condition  Airway Management Planned:   Additional Equipment:   Intra-op Plan:   Post-operative Plan:   Informed Consent: I have reviewed the patients History and Physical, chart, labs and discussed the procedure including the risks, benefits and alternatives for the proposed anesthesia with the patient or authorized representative who has indicated his/her understanding and acceptance.       Plan Discussed with: CRNA  Anesthesia Plan Comments:         Anesthesia Quick Evaluation

## 2021-01-04 NOTE — Anesthesia Procedure Notes (Signed)
Spinal  Patient location during procedure: OR Start time: 01/04/2021 1:25 PM End time: 01/04/2021 1:31 PM Staffing Performed: anesthesiologist  Anesthesiologist: Darral Dash, DO Preanesthetic Checklist Completed: patient identified, IV checked, site marked, risks and benefits discussed, surgical consent, monitors and equipment checked, pre-op evaluation and timeout performed Spinal Block Patient position: sitting Prep: DuraPrep Patient monitoring: heart rate, cardiac monitor, continuous pulse ox and blood pressure Approach: midline Location: L3-4 Injection technique: single-shot Needle Needle type: Quincke  Needle gauge: 22 G Needle length: 9 cm Assessment Events: CSF return Additional Notes Patient identified. Risks/Benefits/Options discussed with patient including but not limited to bleeding, infection, nerve damage, paralysis, failed block, incomplete pain control, headache, blood pressure changes, nausea, vomiting, reactions to medications, itching and postpartum back pain. Confirmed with bedside nurse the patient's most recent platelet count. Confirmed with patient that they are not currently taking any anticoagulation, have any bleeding history or any family history of bleeding disorders. Patient expressed understanding and wished to proceed. All questions were answered. Sterile technique was used throughout the entire procedure. Please see nursing notes for vital signs. Warning signs of high block given to the patient including shortness of breath, tingling/numbness in hands, complete motor block, or any concerning symptoms with instructions to call for help. Patient was given instructions on fall risk and not to get out of bed. All questions and concerns addressed with instructions to call with any issues or inadequate analgesia.

## 2021-01-04 NOTE — Op Note (Signed)
NAME: [name], [name] MEDICAL RECORD NO: [id number] ACCOUNT NO: [id number] DATE OF BIRTH: 10/04/1950 FACILITY: [facility] LOCATION: [facility] PHYSICIAN: Yetta Barre. Marlou Sa, MD  Operative Report   PREOPERATIVE DIAGNOSIS:  Right knee arthritis.  POSTOPERATIVE DIAGNOSIS:  Right knee arthritis.  PROCEDURE:  Right total knee replacement using Stryker cemented Persona  femur, narrow size 7 femur, size C 5-degree stemmed tibia with all poly 32 mm, 8.5 mm thickness poly patella cemented and medial congruent polyethylene size 13.  SURGEON:  Yetta Barre. Marlou Sa, MD  ASSISTANT:  Annie Main.  INDICATIONS:  The patient is a 70 year old patient with end-stage right knee arthritis, who presents for operative management after explanation of risks and benefits.  PROCEDURE IN DETAIL:  The patient was brought to the operating room where spinal anesthetic was induced.  Preoperative antibiotics were administered.  Timeout was called.  Right leg pre-scrubbed with alcohol, Betadine, and allowed to air dry, prepped  with DuraPrep solution and draped in a sterile manner.  Ioban used to cover the operative field.  The leg was elevated and exsanguinated with the Esmarch wrap.  Tourniquet was inflated.  Timeout was called.  Anterior approach to the knee was made.  Skin  and subcutaneous tissue were sharply divided.  IrriSept solution was utilized after the incision as well as after knee arthroscopy.  Patella was everted.  Severe degenerative arthritis as well as synovitis was present throughout the knee.  Synovectomy  performed.  Osteophytes were removed.  ACL released.  Lateral patellofemoral ligament was released.  Medial soft tissue dissection was performed consistent with her preoperative varus deformity.  Patella was everted.  Anterior horn lateral meniscus  released.  Using intramedullary alignment, the tibia was cut perpendicular to the mechanical axis with a 7-degree slope.  Initial cut 10 mm off the least affected lateral  tibial plateau.  This was later revised 2 more millimeters.  The patient had preop  flexion contracture of about 20 degrees.  A 12 mm cut made off the distal femur and 5 degrees of valgus.  With those cuts made, the 10 and 12 extension block achieved full extension and good stability.  Next, the femur was sized to a size 7.  Tibia sized  to a size C.  Trial components placed.  Correct rotation on the tibia was noted.  The knee was reduced with both a 10 and a 12 mm spacer.  The patient had symmetric flexion and extension gaps.  It should be noted that after sizing to a size 7, the  anterior, posterior and chamfer cuts were made.  Next, the patella was cut down from 23 to 15.  Three peg patellar trial was placed.  Knee was again reduced with both a 12 and a 13 poly.  Both achieved full extension, but was slightly more stable with  the 13.  This matched the resection.  Trial components were removed.  Thorough irrigation performed with IrriSept solution followed by 3 liters of pulsatile saline followed by tranexamic acid sponge for 3 minutes with the IrriSept solution for 3 minutes.   Then, these were removed.  IrriSept solution then placed into the intramedullary canal of the tibia.  Vancomycin powder placed.  Bone plug was placed.  The tibia, femur and patella were cemented into position and a 13 poly was placed.  Same stability  parameters were maintained.  The patient had full extension, had full flexion, good stability to varus and valgus stress and good patellar tracking using no thumbs technique.  Tourniquet  was released at this time after cement hardening had occurred and  excess cement removed.  Thorough irrigation again performed.  Median parapatellar arthrotomy closed over a bolster using #1 Vicryl suture.  Prior to final arthrotomy closure, the knee joint was again irrigated with IrriSept solution and vancomycin powder  placed.  Then, arthrotomy was closed.  The rest of the incision was closed using  interrupted inverted 0 Vicryl suture, 2-0 Vicryl suture, and 3-0 Monocryl with Steri-Strips and Aquacel dressing applied.  The patient tolerated the procedure well without  immediate complication.  Prior to final skin closure, it should be noted that a solution of Marcaine, morphine, clonidine injected into the knee for postoperative pain relief.  The patient tolerated the procedure well without immediate complication and  transferred to recovery room in stable condition.  Ace wrap, Iceman and knee immobilizer placed.  Luke's assistance was required at all times for retraction, opening, closing, mobilization of tissue.  His assistance was a medical necessity.   NIK D: 01/04/2021 5:01:51 pm T: 01/04/2021 10:56:00 pm  JOB: [id number]/ [id number]

## 2021-01-04 NOTE — H&P (Signed)
TOTAL KNEE ADMISSION H&P  Patient is being admitted for right total knee arthroplasty.  Subjective:  Chief Complaint:right knee pain.  HPI: [name], 71 y.o. female, has a history of pain and functional disability in the right knee due to arthritis and has failed non-surgical conservative treatments for greater than 12 weeks to includeNSAID's and/or analgesics, corticosteriod injections, viscosupplementation injections, flexibility and strengthening excercises, use of assistive devices, and activity modification.  Onset of symptoms was gradual, starting >10 years ago with gradually worsening course since that time. The patient noted no past surgery on the right knee(s).  Patient currently rates pain in the right knee(s) at 8 out of 10 with activity. Patient has night pain, worsening of pain with activity and weight bearing, pain that interferes with activities of daily living, pain with passive range of motion, crepitus, and joint swelling.  Patient has evidence of subchondral sclerosis and joint space narrowing by imaging studies. This patient has had a good result with her left total knee replacement.  Urinary analysis demonstrated some E. coli.  She is having no urinary tract symptoms at this time.  Nitrate negative no leukocytes and no bacteria in the urinalysis.  I think this is a contaminant.  Patient also had right ingrown toenail which has improved compared to prior visit when she was going to have surgery.. There is no active infection.  Patient Active Problem List   Diagnosis Date Noted   Arthritis of left knee 07/17/2019   Asthma 06/12/2017   Hyperlipidemia 06/12/2017   Seizures ([place]) 06/12/2017   Seizure ([place]) 06/12/2017   GERD (gastroesophageal reflux disease) 10/31/2016   Chronic rhinitis 10/31/2016   Chronic cholecystitis 04/12/2015   Preoperative clearance 03/17/2015   Spastic hemiplegia affecting nondominant side ([place]) 10/27/2013   Adhesive capsulitis of right shoulder  10/27/2013   Chronic diastolic heart failure ([place]) 08/26/2013   Dyslipidemia 08/26/2013   Borderline diabetes 08/26/2013   Cerebral infarction ([place]) 08/26/2013   Acute CVA (cerebrovascular accident) ([place]) 08/23/2013   Pre-syncope 08/17/2012   Hypokalemia 08/17/2012   ARTHRITIS 03/28/2010   DE QUERVAIN'S TENOSYNOVITIS 03/28/2010   Normocytic anemia 07/26/2009   Moderate persistent asthma 03/09/2009   DENTAL CARIES 03/09/2009   CANDIDIASIS OF VULVA AND VAGINA 06/04/2008   SKIN TAG 06/04/2008   ARTHRITIS, KNEES, BILATERAL 05/22/2008   HYPERTENSION, BENIGN ESSENTIAL 05/04/2008   KNEE PAIN, BILATERAL 05/04/2008   Cough, persistent 05/04/2008   MENISCUS TEAR, RIGHT 12/07/2005   Past Medical History:  Diagnosis Date   Arthritis    Asthma    Difficulty sleeping    Gallstones    History of kidney stones    Hyperlipidemia    Hypertension    Pneumonia    Preoperative clearance    Spasmodic cough 06/2017   "IT COMES IN DIFFERENT SEASONS"   Stroke ([place]) 2015   WEAKNESS L SIDE OF BODY     Past Surgical History:  Procedure Laterality Date   CHOLECYSTECTOMY N/A 04/12/2015   Procedure: LAPAROSCOPIC CHOLECYSTECTOMY WITH INTRAOPERATIVE CHOLANGIOGRAM;  Surgeon: Greer Pickerel, MD;  Location: [facility];  Service: General;  Laterality: N/A;   gall stones     KNEE ARTHROSCOPY  2010   TOTAL KNEE ARTHROPLASTY Left 07/17/2019   Procedure: LEFT TOTAL KNEE ARTHROPLASTY;  Surgeon: Meredith Pel, MD;  Location: [facility];  Service: Orthopedics;  Laterality: Left;   TUBAL LIGATION      Current Facility-Administered Medications  Medication Dose Route Frequency Provider Last Rate Last Admin   ceFAZolin (ANCEF) IVPB 2g/100  mL premix  2 g Intravenous On Call to OR Magnant, Gerrianne Scale, PA-C       lactated ringers infusion   Intravenous Continuous Ellender, Karyl Kinnier, MD 10 mL/hr at 01/04/21 1117 Continued from Pre-op at 01/04/21 1117   povidone-iodine (BETADINE) 7.5 % scrub   Topical Once Magnant, Charles L,  PA-C       tranexamic acid (CYKLOKAPRON) 2,000 mg in sodium chloride 0.9 % 50 mL Topical Application  6,301 mg Topical To OR Meredith Pel, MD       tranexamic acid (CYKLOKAPRON) IVPB 1,000 mg  1,000 mg Intravenous To OR Magnant, Charles L, PA-C       Facility-Administered Medications Ordered in Other Encounters  Medication Dose Route Frequency Provider Last Rate Last Admin   bupivacaine-epinephrine (MARCAINE W/ EPI) 0.5% -1:200000 injection   Peri-NEURAL Anesthesia Intra-op Ellender, Karyl Kinnier, MD   30 mL at 01/04/21 1205   No Known Allergies  Social History   Tobacco Use   Smoking status: Former    Types: Cigarettes    Quit date: 04/08/1994    Years since quitting: 26.7   Smokeless tobacco: Never  Substance Use Topics   Alcohol use: No    Family History  Problem Relation Age of Onset   Diabetes Mother    Colon cancer Father 39   Colon polyps Neg Hx    Esophageal cancer Neg Hx    Rectal cancer Neg Hx    Stomach cancer Neg Hx      Review of Systems  Musculoskeletal:  Positive for arthralgias.  All other systems reviewed and are negative.  Objective:  Physical Exam Vitals reviewed.  HENT:     Head: Normocephalic.     Nose: Nose normal.     Mouth/Throat:     Mouth: Mucous membranes are moist.  Eyes:     Pupils: Pupils are equal, round, and reactive to light.  Cardiovascular:     Rate and Rhythm: Normal rate.     Pulses: Normal pulses.  Pulmonary:     Effort: Pulmonary effort is normal.  Abdominal:     General: Abdomen is flat.  Musculoskeletal:     Cervical back: Normal range of motion.  Skin:    General: Skin is warm.     Capillary Refill: Capillary refill takes less than 2 seconds.  Neurological:     General: No focal deficit present.     Mental Status: She is alert.  Psychiatric:        Mood and Affect: Mood normal.    Vital signs in last 24 hours: Temp:  [98.7 F (37.1 C)] 98.7 F (37.1 C) (11/29 1045) Pulse Rate:  [77-102] 81 (11/29 1215) Resp:   [13-18] 13 (11/29 1215) BP: (145-161)/(74-103) 145/103 (11/29 1215) SpO2:  [97 %-99 %] 98 % (11/29 1215) Weight:  [103.7 kg] 103.7 kg (11/29 1045)  Labs:   Estimated body mass index is 40.5 kg/m as calculated from the following:   Height as of this encounter: 5\' 3"  (1.6 m).   Weight as of this encounter: 103.7 kg.   Imaging Review Plain radiographs demonstrate severe degenerative joint disease of the right knee(s). The overall alignment ismild varus. The bone quality appears to be good for age and reported activity level.      Assessment/Plan:  End stage arthritis, right knee   The patient history, physical examination, clinical judgment of the provider and imaging studies are consistent with end stage degenerative joint disease of the right knee(s)  and total knee arthroplasty is deemed medically necessary. The treatment options including medical management, injection therapy arthroscopy and arthroplasty were discussed at length. The risks and benefits of total knee arthroplasty were presented and reviewed. The risks due to aseptic loosening, infection, stiffness, patella tracking problems, thromboembolic complications and other imponderables were discussed. The patient acknowledged the explanation, agreed to proceed with the plan and consent was signed. Patient is being admitted for inpatient treatment for surgery, pain control, PT, OT, prophylactic antibiotics, VTE prophylaxis, progressive ambulation and ADL's and discharge planning. The patient is planning to be discharged home with home health services     Patient's anticipated LOS is less than 2 midnights, meeting these requirements: - Younger than 30 - Lives within 1 hour of care - Has a competent adult at home to recover with post-op recover - NO history of  - Chronic pain requiring opiods  - Diabetes  - Coronary Artery Disease  - Heart failure  - Heart attack  - Stroke  - DVT/VTE  - Cardiac arrhythmia  -  Respiratory Failure/COPD  - Renal failure  - Anemia  - Advanced Liver disease

## 2021-01-04 NOTE — Anesthesia Procedure Notes (Signed)
Date/Time: 01/04/2021 1:20 PM Performed by: Michele Rockers, CRNA Pre-anesthesia Checklist: Patient identified, Emergency Drugs available, Suction available, Timeout performed and Patient being monitored Patient Re-evaluated:Patient Re-evaluated prior to induction Oxygen Delivery Method: Simple face mask

## 2021-01-04 NOTE — Anesthesia Procedure Notes (Signed)
Anesthesia Regional Block: Adductor canal block   Pre-Anesthetic Checklist: , timeout performed,  Correct Patient, Correct Site, Correct Laterality,  Correct Procedure,, site marked,  Risks and benefits discussed,  Surgical consent,  Pre-op evaluation,  At surgeon's request and post-op pain management  Laterality: Right  Prep: chloraprep       Needles:  Injection technique: Single-shot  Needle Type: Echogenic Stimulator Needle     Needle Length: 10cm  Needle Gauge: 20     Additional Needles:   Procedures:,,,, ultrasound used (permanent image in chart),,    Narrative:  Start time: 01/04/2021 12:00 PM End time: 01/04/2021 12:10 PM Injection made incrementally with aspirations every 5 mL.  Performed by: Personally  Anesthesiologist: Murvin Natal, MD  Additional Notes: Functioning IV was confirmed and monitors were applied. A time-out was performed. Hand hygiene and sterile gloves were used. The thigh was placed in a frog-leg position and prepped in a sterile fashion. A 163mm 20ga BBraun echogenic stimulator needle was placed using ultrasound guidance.  Negative aspiration and negative test dose prior to incremental administration of local anesthetic. The patient tolerated the procedure well.

## 2021-01-04 NOTE — Brief Op Note (Signed)
   01/04/2021  4:55 PM  PATIENT:  Ignacia Palma Breon  70 y.o. female  PRE-OPERATIVE DIAGNOSIS:  right knee osteoarthritis  POST-OPERATIVE DIAGNOSIS:  right knee osteoarthritis  PROCEDURE:  Procedure(s): RIGHT TOTAL KNEE ARTHROPLASTY  SURGEON:  Surgeon(s): Marlou Sa, Tonna Corner, MD  ASSISTANT: magnant pa  ANESTHESIA:   spinal EBL: 50 ml    Total I/O In: 1600 [I.V.:1400; IV Piggyback:200] Out: 25 [Blood:25]  BLOOD ADMINISTERED: none  DRAINS: none   LOCAL MEDICATIONS USED:  marcaine mso4 clonidine vanco  SPECIMEN:  No Specimen  COUNTS:  YES  TOURNIQUET:   Total Tourniquet Time Documented: Thigh (Right) - 117 minutes Total: Thigh (Right) - 117 minutes   DICTATION: .Other Dictation: Dictation Number [id number]  PLAN OF CARE: Admit for overnight observation  PATIENT DISPOSITION:  PACU - hemodynamically stable

## 2021-01-04 NOTE — Op Note (Deleted)
  The note originally documented on this encounter has been moved the the encounter in which it belongs.  

## 2021-01-04 NOTE — Progress Notes (Signed)
Orthopedic Tech Progress Note Patient Details:  [name] 20-May-1950 [id number]  Ortho Devices Type of Ortho Device: Bone foam zero knee Ortho Device/Splint Interventions: Ordered   Post Interventions Patient Tolerated: Well Instructions Provided: [facility] [place] 01/04/2021, 6:41 PM

## 2021-01-05 ENCOUNTER — Encounter (HOSPITAL_COMMUNITY): Payer: Self-pay | Admitting: Orthopedic Surgery

## 2021-01-05 DIAGNOSIS — M1711 Unilateral primary osteoarthritis, right knee: Secondary | ICD-10-CM | POA: Diagnosis not present

## 2021-01-05 DIAGNOSIS — M179 Osteoarthritis of knee, unspecified: Secondary | ICD-10-CM | POA: Diagnosis present

## 2021-01-05 MED ORDER — CELECOXIB 100 MG PO CAPS
100.0000 mg | ORAL_CAPSULE | Freq: Two times a day (BID) | ORAL | Status: DC
  Administered 2021-01-05 – 2021-01-06 (×3): 100 mg via ORAL
  Filled 2021-01-05 (×4): qty 1

## 2021-01-05 MED ORDER — METHOCARBAMOL 500 MG PO TABS
500.0000 mg | ORAL_TABLET | Freq: Four times a day (QID) | ORAL | Status: DC | PRN
  Administered 2021-01-06 (×2): 500 mg via ORAL
  Filled 2021-01-05 (×2): qty 1

## 2021-01-05 MED ORDER — CEFAZOLIN SODIUM-DEXTROSE 2-4 GM/100ML-% IV SOLN
2.0000 g | Freq: Three times a day (TID) | INTRAVENOUS | Status: AC
  Administered 2021-01-05 (×2): 2 g via INTRAVENOUS
  Filled 2021-01-05 (×2): qty 100

## 2021-01-05 MED ORDER — METOCLOPRAMIDE HCL 5 MG PO TABS: 5.0000 mg | ORAL_TABLET | Freq: Three times a day (TID) | ORAL | Status: DC | PRN

## 2021-01-05 MED ORDER — OXYCODONE HCL 5 MG PO TABS
5.0000 mg | ORAL_TABLET | ORAL | Status: DC | PRN
  Administered 2021-01-05 – 2021-01-06 (×4): 10 mg via ORAL
  Filled 2021-01-05 (×4): qty 2

## 2021-01-05 MED ORDER — METOCLOPRAMIDE HCL 5 MG/ML IJ SOLN: 5.0000 mg | Freq: Three times a day (TID) | INTRAMUSCULAR | Status: DC | PRN

## 2021-01-05 MED ORDER — FLUTICASONE FUROATE-VILANTEROL 200-25 MCG/ACT IN AEPB
1.0000 | INHALATION_SPRAY | Freq: Every day | RESPIRATORY_TRACT | Status: DC
  Administered 2021-01-05 – 2021-01-06 (×2): 1 via RESPIRATORY_TRACT
  Filled 2021-01-05: qty 28

## 2021-01-05 MED ORDER — ASPIRIN 81 MG PO CHEW
81.0000 mg | CHEWABLE_TABLET | Freq: Two times a day (BID) | ORAL | Status: DC
  Administered 2021-01-05 – 2021-01-06 (×3): 81 mg via ORAL
  Filled 2021-01-05 (×3): qty 1

## 2021-01-05 MED ORDER — LACTATED RINGERS IV SOLN: INTRAVENOUS | Status: DC

## 2021-01-05 MED ORDER — ACETAMINOPHEN 500 MG PO TABS
1000.0000 mg | ORAL_TABLET | Freq: Four times a day (QID) | ORAL | Status: AC
  Administered 2021-01-05 (×3): 1000 mg via ORAL
  Filled 2021-01-05 (×3): qty 2

## 2021-01-05 MED ORDER — HYDROMORPHONE HCL 1 MG/ML IJ SOLN
0.5000 mg | INTRAMUSCULAR | Status: DC | PRN
  Administered 2021-01-05: 0.5 mg via INTRAVENOUS
  Filled 2021-01-05: qty 0.5

## 2021-01-05 MED ORDER — UMECLIDINIUM BROMIDE 62.5 MCG/ACT IN AEPB
1.0000 | INHALATION_SPRAY | Freq: Every day | RESPIRATORY_TRACT | Status: DC
  Administered 2021-01-05 – 2021-01-06 (×2): 1 via RESPIRATORY_TRACT
  Filled 2021-01-05: qty 7

## 2021-01-05 MED ORDER — DOCUSATE SODIUM 100 MG PO CAPS
100.0000 mg | ORAL_CAPSULE | Freq: Two times a day (BID) | ORAL | Status: DC
  Administered 2021-01-05 – 2021-01-06 (×3): 100 mg via ORAL
  Filled 2021-01-05 (×3): qty 1

## 2021-01-05 MED ORDER — ACETAMINOPHEN 325 MG PO TABS
325.0000 mg | ORAL_TABLET | Freq: Four times a day (QID) | ORAL | Status: DC | PRN
  Filled 2021-01-05: qty 2

## 2021-01-05 MED ORDER — FENTANYL CITRATE (PF) 100 MCG/2ML IJ SOLN
INTRAMUSCULAR | Status: AC
  Filled 2021-01-05: qty 2

## 2021-01-05 MED ORDER — MENTHOL 3 MG MT LOZG: 1.0000 | LOZENGE | OROMUCOSAL | Status: DC | PRN

## 2021-01-05 MED ORDER — ONDANSETRON HCL 4 MG PO TABS: 4.0000 mg | ORAL_TABLET | Freq: Four times a day (QID) | ORAL | Status: DC | PRN

## 2021-01-05 MED ORDER — ONDANSETRON HCL 4 MG/2ML IJ SOLN: 4.0000 mg | Freq: Four times a day (QID) | INTRAMUSCULAR | Status: DC | PRN

## 2021-01-05 MED ORDER — METHOCARBAMOL 1000 MG/10ML IJ SOLN
500.0000 mg | Freq: Four times a day (QID) | INTRAVENOUS | Status: DC | PRN
  Filled 2021-01-05: qty 5

## 2021-01-05 MED ORDER — ALBUTEROL SULFATE (2.5 MG/3ML) 0.083% IN NEBU: 2.5000 mg | INHALATION_SOLUTION | Freq: Four times a day (QID) | RESPIRATORY_TRACT | Status: DC | PRN

## 2021-01-05 MED ORDER — PHENOL 1.4 % MT LIQD: 1.0000 | OROMUCOSAL | Status: DC | PRN

## 2021-01-05 NOTE — Evaluation (Signed)
Physical Therapy Evaluation Patient Details Name: [name] MRN: [id number] DOB: 1951-01-31 Today's Date: 01/05/2021  History of Present Illness  Admitted for R TKA, WBAT post op; contributory history of stroke with L residual weakness;  has a past medical history of Arthritis, Asthma, Difficulty sleeping, Gallstones, History of kidney stones, Hyperlipidemia, Hypertension, Pneumonia, Preoperative clearance, Spasmodic cough (06/2017), and Stroke ([place]) (2015).  Clinical Impression   Pt is s/p TKA resulting in the deficits listed below (see PT Problem List). Managing at home independently prior to admission; uses assistive device occasionally due to residual weakness from CVA; Presents to PT with functional dependencies, pain in R knee, and difficulty with mobility due to R knee pain, and L sided weakness;  Pt will benefit from skilled PT to increase their independence and safety with mobility to allow discharge to the venue listed below.         Recommendations for follow up therapy are one component of a multi-disciplinary discharge planning process, led by the attending physician.  Recommendations may be updated based on patient status, additional functional criteria and insurance authorization.  Follow Up Recommendations Follow physician's recommendations for discharge plan and follow up therapies (PT currently recommends post-acute rehab at SNF maximize independence and safety with mobility and ADLs prior to dc home)    Assistance Recommended at Discharge    Functional Status Assessment Patient has had a recent decline in their functional status and demonstrates the ability to make significant improvements in function in a reasonable and predictable amount of time.  Equipment Recommendations  Rolling walker (2 wheels);BSC/3in1;Other (comment) (consider tub transfer bench -- can defer to OT recs for ADL DME)    Recommendations for Other Services OT consult (ordered per protocol)      Precautions / Restrictions Precautions Precautions: Knee;Fall Precaution Booklet Issued: Yes (comment) Precaution Comments: Pt educated to not allow any pillow or bolster under knee for healing with optimal range of motion.  Required Braces or Orthoses: Knee Immobilizer - Right Knee Immobilizer - Right: Other (comment) (KI in the room, not in the order set) Restrictions Weight Bearing Restrictions: No RLE Weight Bearing: Weight bearing as tolerated Other Position/Activity Restrictions: Please be aware she has had a stroke, and has residual weakness in her L (non-operative, and therefore less painful) side      Mobility  Bed Mobility Overal bed mobility: Needs Assistance Bed Mobility: Supine to Sit     Supine to sit: Mod assist     General bed mobility comments: Moving very slowly, cues for technqiue, and to use LLE to 'half-bridge" and scoot hip to EOB; light mod handheld assist to pull to sit; suport given for RLE as pt scooted closer to EOB, very short, slow, inefficient scoots    Transfers Overall transfer level: Needs assistance Equipment used: Rolling walker (2 wheels) Transfers: Sit to/from Stand;Bed to chair/wheelchair/BSC Sit to Stand: Mod assist;+2 safety/equipment   Step pivot transfers: Mod assist;+2 physical assistance;+2 safety/equipment       General transfer comment: Heavy mod assist to power up to fully upright standing; Mod assist as well for pivotal steps; Able to take enough weight on R knee in stance to allow for heel-toe/shuffle steps of LLE; trunk flexed over RW most of pivot transfer; no overt R knee buckling observed    Ambulation/Gait                  Science writer  Modified Rankin (Stroke Patients Only)       Balance Overall balance assessment: Needs assistance   Sitting balance-Leahy Scale: Fair       Standing balance-Leahy Scale: Poor                               Pertinent  Vitals/Pain Pain Assessment: 0-10 Pain Score: 8  Pain Location: R knee` Pain Descriptors / Indicators: Grimacing;Guarding Pain Intervention(s): Monitored during session;RN gave pain meds during session    Home Living Family/patient expects to be discharged to:: Private residence Living Arrangements: Other (Comment) (Grandson, who is out a lot) Available Help at Discharge: Other (Comment) (pt states she has limited help at home) Type of Home: House Home Access: Stairs to enter Entrance Stairs-Rails: Can reach both Entrance Stairs-Number of Steps: 3   Home Layout: One level Home Equipment: [place] (2 wheels);Rollator (4 wheels);BSC/3in1      Prior Function Prior Level of Function : Independent/Modified Independent             Mobility Comments: cane/rollator rW prn       Hand Dominance   Dominant Hand: Right    Extremity/Trunk Assessment   Upper Extremity Assessment Upper Extremity Assessment: Defer to OT evaluation (previous stroke effecting LUE function)    Lower Extremity Assessment Lower Extremity Assessment: RLE deficits/detail;LLE deficits/detail RLE Deficits / Details: Grossly decr AROM and strength, limited by pain postop LLE Deficits / Details: Gerneralized weakness, after CVA effecting L side       Communication      Cognition Arousal/Alertness: Awake/alert Behavior During Therapy: WFL for tasks assessed/performed Overall Cognitive Status: Within Functional Limits for tasks assessed                                 General Comments: Very painful during session, but still participating; very sleepy once IV pain meds given        General Comments General comments (skin integrity, edema, etc.): Assisted pt with gown change as well    Exercises Total Joint Exercises Quad Sets: AROM;Both;10 reps Heel Slides: Right;5 reps;Other (comment) (strong muscle guarding with limited range) Goniometric ROM: 5-60 approximately    Assessment/Plan    PT Assessment Patient needs continued PT services  PT Problem List Decreased strength;Decreased range of motion;Decreased activity tolerance;Decreased balance;Decreased mobility;Decreased knowledge of precautions;Impaired tone;Pain       PT Treatment Interventions DME instruction;Gait training;Stair training;Functional mobility training;Therapeutic activities;Therapeutic exercise;Balance training;Patient/family education;Neuromuscular re-education    PT Goals (Current goals can be found in the Care Plan section)  Acute Rehab PT Goals Patient Stated Goal: Be able to take care of her house PT Goal Formulation: With patient Time For Goal Achievement: 01/19/21 Potential to Achieve Goals: Good    Frequency 7X/week   Barriers to discharge Decreased caregiver support Pt lives alone, tells me her grandson lives with her and is out a lot    Co-evaluation               AM-PAC PT "6 Clicks" Mobility  Outcome Measure Help needed turning from your back to your side while in a flat bed without using bedrails?: A Little Help needed moving from lying on your back to sitting on the side of a flat bed without using bedrails?: A Lot Help needed moving to and from a bed to a chair (including a wheelchair)?: A  Lot Help needed standing up from a chair using your arms (e.g., wheelchair or bedside chair)?: A Lot Help needed to walk in hospital room?: A Lot Help needed climbing 3-5 steps with a railing? : Total 6 Click Score: 12    End of Session Equipment Utilized During Treatment: Gait belt Activity Tolerance: Patient limited by pain;Other (comment) (but still particiapting well) Patient left: in chair;with call bell/phone within reach;with chair alarm set Nurse Communication: Mobility status PT Visit Diagnosis: Unsteadiness on feet (R26.81);Pain;Other abnormalities of gait and mobility (R26.89) Pain - Right/Left: Right Pain - part of body: Knee    Time:  5686-1683 PT Time Calculation (min) (ACUTE ONLY): 43 min   Charges:   PT Evaluation $PT Eval Moderate Complexity: 1 Mod PT Treatments $Therapeutic Activity: 23-37 mins        Roney Marion, PT  Acute Rehabilitation Services Pager [phone] Office [phone]   [place] 01/05/2021, 11:33 AM

## 2021-01-05 NOTE — TOC Progression Note (Signed)
Transition of Care [facility]) - Progression Note    Patient Details  Name: [name] MRN: [id number] Date of Birth: [birth year]  Transition of Care [facility]) CM/SW Contact  Joanne Chars, LCSW Phone Number: 01/05/2021, 3:30 PM  Clinical Narrative:   CSW contacted [employer], they do manage this [employer] policy.   Auth submitted and approved in [place]: [id number], 5 days 12/1-12/5.    Expected Discharge Plan: Skilled Nursing Facility Barriers to Discharge: Continued Medical Work up  Expected Discharge Plan and Services Expected Discharge Plan: [place]   Discharge Planning Services: CM Consult                                           Social Determinants of Health (SDOH) Interventions    Readmission Risk Interventions No flowsheet data found.

## 2021-01-05 NOTE — Progress Notes (Signed)
  Subjective: [name] is a 70 y.o. female s/p right TKA.  They are POD 1.  Pt's pain is controlled.    Pt has ambulated with great difficulty.   Really mostly works on transferring with physical therapy this morning.  She denies any chest pain, shortness of breath, abdominal pain, calf pain.  Denies any lightheadedness or dizziness.  Objective: Vital signs in last 24 hours: Temp:  [97.5 F (36.4 C)-98.6 F (37 C)] 97.9 F (36.6 C) (11/30 0806) Pulse Rate:  [68-94] 94 (11/30 0806) Resp:  [10-20] 20 (11/30 0806) BP: (76-159)/(52-99) 141/74 (11/30 0806) SpO2:  [90 %-100 %] 92 % (11/30 0831)  Intake/Output from previous day: 11/29 0701 - 11/30 0700 In: 1600 [I.V.:1400; IV Piggyback:200] Out: 525 [Urine:500; Blood:25] Intake/Output this shift: No intake/output data recorded.  Exam:  No gross blood or drainage overlying the dressing 2+ DP pulse Sensation intact distally in the right foot Able to dorsiflex and plantarflex the right foot No calf tenderness.  Not able to perform straight leg raise yet   Labs: No results for input(s): HGB in the last 72 hours. No results for input(s): WBC, RBC, HCT, PLT in the last 72 hours. No results for input(s): NA, K, CL, CO2, BUN, CREATININE, GLUCOSE, CALCIUM in the last 72 hours. No results for input(s): LABPT, INR in the last 72 hours.  Assessment/Plan: Pt is POD 1 s/p right TKA.    -Plan to discharge to SNF in coming days pending patient's pain and PT eval  -WBAT with a walker  -Plan is for likely discharge to skilled nursing facility as she did with her last knee replacement on the left side in 2021.  She would like to go to a different facility rather than [facility] like she did last time.  She transition to home health physical therapy after staying in skilled nursing facility for several weeks so imagine that will happen again.  No red flag symptoms today.  She had some hypotension overnight but overall she looks well today and  vitals are stable this morning.     [name] 01/05/2021, 12:59 PM

## 2021-01-05 NOTE — [place] FL2 (Signed)
[place] MEDICAID FL2 LEVEL OF CARE SCREENING TOOL     IDENTIFICATION  Patient Name: [name] Birthdate: 08/12/1950 Sex: female Admission Date (Current Location): 01/04/2021  [facility] and [place] Number:  Herbalist and Address:  [facility], [place] [address] [place], [place] [address]      Provider Number: [id number]  Attending Physician Name and Address:  Meredith Pel, MD  Relative Name and Phone Number:       Current Level of Care: Hospital Recommended Level of Care: [place] Prior Approval Number:    Date Approved/Denied:   PASRR Number: [id number]  Discharge Plan: SNF    Current Diagnoses: Patient Active Problem List   Diagnosis Date Noted   DJD (degenerative joint disease) of knee 01/05/2021   S/P total knee replacement, right 01/04/2021   Arthritis of left knee 07/17/2019   Asthma 06/12/2017   Hyperlipidemia 06/12/2017   Seizures ([place]) 06/12/2017   Seizure ([place]) 06/12/2017   GERD (gastroesophageal reflux disease) 10/31/2016   Chronic rhinitis 10/31/2016   Chronic cholecystitis 04/12/2015   Preoperative clearance 03/17/2015   Spastic hemiplegia affecting nondominant side ([place]) 10/27/2013   Adhesive capsulitis of right shoulder 10/27/2013   Chronic diastolic heart failure ([place]) 08/26/2013   Dyslipidemia 08/26/2013   Borderline diabetes 08/26/2013   Cerebral infarction ([place]) 08/26/2013   Acute CVA (cerebrovascular accident) ([place]) 08/23/2013   Pre-syncope 08/17/2012   Hypokalemia 08/17/2012   ARTHRITIS 03/28/2010   DE QUERVAIN'S TENOSYNOVITIS 03/28/2010   Normocytic anemia 07/26/2009   Moderate persistent asthma 03/09/2009   DENTAL CARIES 03/09/2009   CANDIDIASIS OF VULVA AND VAGINA 06/04/2008   SKIN TAG 06/04/2008   ARTHRITIS, KNEES, BILATERAL 05/22/2008   HYPERTENSION, BENIGN ESSENTIAL 05/04/2008   KNEE PAIN, BILATERAL 05/04/2008   Cough, persistent 05/04/2008   MENISCUS TEAR, RIGHT  12/07/2005    Orientation RESPIRATION BLADDER Height & Weight     Self, Time, Situation, Place  Normal Continent Weight: 103.7 kg Height:  5\' 3"  (160 cm)  BEHAVIORAL SYMPTOMS/MOOD NEUROLOGICAL BOWEL NUTRITION STATUS      Continent Diet (refer to d/c summary)  AMBULATORY STATUS COMMUNICATION OF NEEDS Skin   Extensive Assist Verbally Surgical wounds (11/29  s/p R T KA)                       Personal Care Assistance Level of Assistance  Bathing, Feeding, Dressing Bathing Assistance: Maximum assistance Feeding assistance: Independent Dressing Assistance: Maximum assistance     Functional Limitations Info  Sight, Speech, Hearing Sight Info: Adequate Hearing Info: Adequate Speech Info: Adequate    SPECIAL CARE FACTORS FREQUENCY  PT (By licensed PT), OT (By licensed OT)     PT Frequency: 5x/week , evaluate and treat OT Frequency: 5x/week , evaluate and treat            Contractures Contractures Info: Not present    Additional Factors Info  Code Status, Allergies Code Status Info: Full Code Allergies Info: : No Known Allergies           Current Medications (01/05/2021):  This is the current hospital active medication list Current Facility-Administered Medications  Medication Dose Route Frequency Provider Last Rate Last Admin   acetaminophen (TYLENOL) tablet 1,000 mg  1,000 mg Oral Q6H Magnant, Charles L, PA-C   1,000 mg at 01/05/21 0626   [START ON 01/06/2021] acetaminophen (TYLENOL) tablet 325-650 mg  325-650 mg Oral Q6H PRN Magnant, Gerrianne Scale, PA-C  albuterol (PROVENTIL) (2.5 MG/3ML) 0.083% nebulizer solution 2.5 mg  2.5 mg Nebulization BID Magnant, Charles L, PA-C   2.5 mg at 01/05/21 0831   amLODipine (NORVASC) tablet 5 mg  5 mg Oral Daily Magnant, Charles L, PA-C   5 mg at 01/05/21 8841   aspirin chewable tablet 81 mg  81 mg Oral BID Magnant, Charles L, PA-C   81 mg at 01/05/21 6606   celecoxib (CELEBREX) capsule 100 mg  100 mg Oral BID Magnant, Charles  L, PA-C   100 mg at 01/05/21 3016   docusate sodium (COLACE) capsule 100 mg  100 mg Oral BID Magnant, Charles L, PA-C   100 mg at 01/05/21 0109   fentaNYL (SUBLIMAZE) 100 MCG/2ML injection            fluticasone furoate-vilanterol (BREO ELLIPTA) 200-25 MCG/ACT 1 puff  1 puff Inhalation Daily Magnant, Charles L, PA-C   1 puff at 01/05/21 3235   And   umeclidinium bromide (INCRUSE ELLIPTA) 62.5 MCG/ACT 1 puff  1 puff Inhalation Daily Magnant, Charles L, PA-C   1 puff at 01/05/21 5732   HYDROmorphone (DILAUDID) injection 0.5 mg  0.5 mg Intravenous Q4H PRN Magnant, Charles L, PA-C   0.5 mg at 01/05/21 2025   lactated ringers infusion   Intravenous Continuous Donella Stade, PA-C 75 mL/hr at 01/05/21 4270 New Bag at 01/05/21 6237   meclizine (ANTIVERT) tablet 25 mg  25 mg Oral TID PRN Magnant, Charles L, PA-C       menthol-cetylpyridinium (CEPACOL) lozenge 3 mg  1 lozenge Oral PRN Magnant, Charles L, PA-C       Or   phenol (CHLORASEPTIC) mouth spray 1 spray  1 spray Mouth/Throat PRN Magnant, Charles L, PA-C       methocarbamol (ROBAXIN) tablet 500 mg  500 mg Oral Q6H PRN Magnant, Charles L, PA-C       Or   methocarbamol (ROBAXIN) 500 mg in dextrose 5 % 50 mL IVPB  500 mg Intravenous Q6H PRN Magnant, Charles L, PA-C       metoCLOPramide (REGLAN) tablet 5-10 mg  5-10 mg Oral Q8H PRN Magnant, Charles L, PA-C       Or   metoCLOPramide (REGLAN) injection 5-10 mg  5-10 mg Intravenous Q8H PRN Magnant, Charles L, PA-C       ondansetron (ZOFRAN) tablet 4 mg  4 mg Oral Q6H PRN Magnant, Charles L, PA-C       Or   ondansetron (ZOFRAN) injection 4 mg  4 mg Intravenous Q6H PRN Magnant, Charles L, PA-C       oxyCODONE (Oxy IR/ROXICODONE) immediate release tablet 5-10 mg  5-10 mg Oral Q4H PRN Magnant, Gerrianne Scale, PA-C         Discharge Medications: Please see discharge summary for a list of discharge medications.  Relevant Imaging Results:  Relevant Lab Results:   Additional Information SS# [id number] [place], [place]

## 2021-01-05 NOTE — TOC Initial Note (Addendum)
Transition of Care [facility]) - Initial/Assessment Note    Patient Details  Name: [name] MRN: [id number] Date of Birth: [birth year]  [facility]) CM/SW Contact:    Sharin Mons, RN Phone Number: 01/05/2021, 1:11 PM  Clinical Narrative:      -     s/p right TKA, 11/30         NCM received consult for possible SNF placement at time of discharge. NCM spoke with patient regarding PT recommendation of ? SNF placement at time of discharge.  Pt states lives alone and  is currently unable to care for self independently at home given  current physical needs and fall risk. Patient expressed understanding of PT recommendation and is agreeable to SNF placement at time of discharge. Patient reports preference for [facility]. NCM discussed insurance authorization process and provided Medicare SNF ratings list. Patient expressed being hopeful for rehab and to feel better soon. No further questions reported at this time. NCM to continue to follow and assist with discharge planning needs.  Fully COVID vaccinated, no booster.  01/05/2021  1350 SNF referrals faxed out. Juliann Pulse [facility] extended SNF bed offer and pt accepted. Insurance auth. pending....  Ovid Curd with [employer] made aware of SNF selection....pt will need CMP device @ SNF  Expected Discharge Plan: [place] Barriers to Discharge: Continued Medical Work up   Patient Goals and CMS Choice   CMS Medicare.gov Compare Post Acute Care list provided to:: Patient    Expected Discharge Plan and Services Expected Discharge Plan: [place]   Discharge Planning Services: CM Consult                                          Prior Living Arrangements/Services   Lives with:: Self Patient language and need for interpreter reviewed:: Yes Do you feel safe going back to the place where you live?: Yes      Need for Family Participation in Patient Care: Yes (Comment) Care giver support system in place?:  No (comment)      Activities of Daily Living Home Assistive Devices/Equipment: Walker (specify type), Cane (specify quad or straight) ADL Screening (condition at time of admission) Patient's cognitive ability adequate to safely complete daily activities?: Yes Is the patient deaf or have difficulty hearing?: No Does the patient have difficulty seeing, even when wearing glasses/contacts?: No Does the patient have difficulty concentrating, remembering, or making decisions?: No Patient able to express need for assistance with ADLs?: Yes Does the patient have difficulty dressing or bathing?: No Independently performs ADLs?: No Communication: Independent Dressing (OT): Independent Grooming: Independent Feeding: Independent Bathing: Independent Toileting: Needs assistance Is this a change from baseline?: Change from baseline, expected to last >3days In/Out Bed: Needs assistance Is this a change from baseline?: Change from baseline, expected to last >3 days Walks in Home: Independent Does the patient have difficulty walking or climbing stairs?: Yes Weakness of Legs: Right Weakness of Arms/Hands: None  Permission Sought/Granted      Share Information with NAME: Moe Brier (Daughter ) [phone],  Trina Asch (Daughter) [phone]           Emotional Assessment Appearance:: Appears stated age Attitude/Demeanor/Rapport: Gracious Affect (typically observed): Accepting Orientation: : Oriented to Self, Oriented to Place, Oriented to  Time, Oriented to Situation Alcohol / Substance Use: Not Applicable Psych Involvement: No (comment)  Admission diagnosis:  S/P total  knee replacement, right [Z96.651] DJD (degenerative joint disease) of knee [M17.9] Patient Active Problem List   Diagnosis Date Noted   DJD (degenerative joint disease) of knee 01/05/2021   S/P total knee replacement, right 01/04/2021   Arthritis of left knee 07/17/2019   Asthma 06/12/2017   Hyperlipidemia  06/12/2017   Seizures ([place]) 06/12/2017   Seizure ([place]) 06/12/2017   GERD (gastroesophageal reflux disease) 10/31/2016   Chronic rhinitis 10/31/2016   Chronic cholecystitis 04/12/2015   Preoperative clearance 03/17/2015   Spastic hemiplegia affecting nondominant side ([place]) 10/27/2013   Adhesive capsulitis of right shoulder 10/27/2013   Chronic diastolic heart failure ([place]) 08/26/2013   Dyslipidemia 08/26/2013   Borderline diabetes 08/26/2013   Cerebral infarction ([place]) 08/26/2013   Acute CVA (cerebrovascular accident) ([place]) 08/23/2013   Pre-syncope 08/17/2012   Hypokalemia 08/17/2012   ARTHRITIS 03/28/2010   DE QUERVAIN'S TENOSYNOVITIS 03/28/2010   Normocytic anemia 07/26/2009   Moderate persistent asthma 03/09/2009   DENTAL CARIES 03/09/2009   CANDIDIASIS OF VULVA AND VAGINA 06/04/2008   SKIN TAG 06/04/2008   ARTHRITIS, KNEES, BILATERAL 05/22/2008   HYPERTENSION, BENIGN ESSENTIAL 05/04/2008   KNEE PAIN, BILATERAL 05/04/2008   Cough, persistent 05/04/2008   MENISCUS TEAR, RIGHT 12/07/2005   PCP:  Nolene Ebbs, MD Pharmacy:   [facility] [place] [place] [address] Phone: [phone] Fax: [phone]     Social Determinants of Health (SDOH) Interventions    Readmission Risk Interventions No flowsheet data found.

## 2021-01-05 NOTE — Care Management Obs Status (Signed)
[place] NOTIFICATION   Patient Details  Name: [name] MRN: [id number] Date of Birth: 1950-02-17   Medicare Observation Status Notification Given:  Yes    Joanne Chars, [place] 01/05/2021, 1:27 PM

## 2021-01-05 NOTE — Progress Notes (Signed)
Patient refused CPM stating she could not handle it currently.

## 2021-01-05 NOTE — Plan of Care (Signed)

## 2021-01-05 NOTE — Progress Notes (Signed)
Physical Therapy Treatment Patient Details Name: [name] MRN: [id number] DOB: Jul 12, 1950 Today's Date: 01/05/2021   History of Present Illness Admitted for R TKA, WBAT post op; contributory history of stroke with L residual weakness;  has a past medical history of Arthritis, Asthma, Difficulty sleeping, Gallstones, History of kidney stones, Hyperlipidemia, Hypertension, Pneumonia, Preoperative clearance, Spasmodic cough (06/2017), and Stroke ([place]) (2015).    PT Comments    Continuing work on functional mobility and activity tolerance;  Session focused on therapeutic exercises as she had recently gotten back to bed; Noting strong quad muscle guarding with heel slides, and weak, but present, quad contractions; will continue efforts   Recommendations for follow up therapy are one component of a multi-disciplinary discharge planning process, led by the attending physician.  Recommendations may be updated based on patient status, additional functional criteria and insurance authorization.  Follow Up Recommendations  Follow physician's recommendations for discharge plan and follow up therapies (PT currently recommends post-acute rehab at SNF maximize independence and safety with mobility  prior to dc home)     Assistance Recommended at Discharge    Equipment Recommendations  Rolling walker (2 wheels);BSC/3in1;Other (comment) (consider tub transfer bench -- can defer to OT recs for ADL DME)    Recommendations for Other Services       Precautions / Restrictions Precautions Precautions: Knee;Fall Precaution Booklet Issued: Yes (comment) Precaution Comments: Pt educated to not allow any pillow or bolster under knee for healing with optimal range of motion.  Required Braces or Orthoses: Knee Immobilizer - Right Knee Immobilizer - Right: Other (comment) (KI in the room, not in the order set) Restrictions RLE Weight Bearing: Weight bearing as tolerated Other Position/Activity Restrictions:  Please be aware she has had a stroke, and has residual weakness in her L (non-operative, and therefore less painful) side     Mobility  Bed Mobility                    Transfers                        Ambulation/Gait                   Stairs             Wheelchair Mobility    Modified Rankin (Stroke Patients Only)       Balance                                            Cognition Arousal/Alertness: Awake/alert Behavior During Therapy: WFL for tasks assessed/performed Overall Cognitive Status: Within Functional Limits for tasks assessed                                          Exercises Total Joint Exercises Ankle Circles/Pumps: AROM;Both;5 reps Quad Sets: AROM;Both;10 reps Short Arc QuadSinclair Ship;Right;10 reps (Unable to get to full extension against gravity) Heel Slides: AAROM;Right;10 reps Straight Leg Raises: AAROM;Right;10 reps    General Comments        Pertinent Vitals/Pain Pain Assessment: 0-10 Pain Score: 8  Pain Location: R knee` Pain Descriptors / Indicators: Grimacing;Guarding Pain Intervention(s): Monitored during session    Home Living  Prior Function            PT Goals (current goals can now be found in the care plan section) Acute Rehab PT Goals Patient Stated Goal: Be able to take care of her house PT Goal Formulation: With patient Time For Goal Achievement: 01/19/21 Potential to Achieve Goals: Good Progress towards PT goals: Progressing toward goals    Frequency    7X/week      PT Plan Current plan remains appropriate    Co-evaluation              AM-PAC PT "6 Clicks" Mobility   Outcome Measure  Help needed turning from your back to your side while in a flat bed without using bedrails?: A Little Help needed moving from lying on your back to sitting on the side of a flat bed without using bedrails?: A Lot Help  needed moving to and from a bed to a chair (including a wheelchair)?: A Lot Help needed standing up from a chair using your arms (e.g., wheelchair or bedside chair)?: A Lot Help needed to walk in hospital room?: A Lot Help needed climbing 3-5 steps with a railing? : Total 6 Click Score: 12    End of Session Equipment Utilized During Treatment: Gait belt Activity Tolerance: Patient tolerated treatment well Patient left: in bed;with call bell/phone within reach;in CPM Nurse Communication: Mobility status PT Visit Diagnosis: Unsteadiness on feet (R26.81);Pain;Other abnormalities of gait and mobility (R26.89) Pain - Right/Left: Right Pain - part of body: Knee     Time: 3734-2876 PT Time Calculation (min) (ACUTE ONLY): 25 min  Charges:  $Therapeutic Exercise: 8-22 mins $Therapeutic Activity: 8-22 mins                     Roney Marion, PT  Acute Rehabilitation Services Pager [phone] Office [place] 01/05/2021, 3:54 PM

## 2021-01-06 ENCOUNTER — Encounter (HOSPITAL_COMMUNITY): Payer: Self-pay | Admitting: Orthopedic Surgery

## 2021-01-06 DIAGNOSIS — M1711 Unilateral primary osteoarthritis, right knee: Secondary | ICD-10-CM | POA: Diagnosis not present

## 2021-01-06 LAB — CBC
HCT: 28.3 % — ABNORMAL LOW (ref 36.0–46.0)
Hemoglobin: 9.1 g/dL — ABNORMAL LOW (ref 12.0–15.0)
MCH: 28.4 pg (ref 26.0–34.0)
MCHC: 32.2 g/dL (ref 30.0–36.0)
MCV: 88.4 fL (ref 80.0–100.0)
Platelets: 174 10*3/uL (ref 150–400)
RBC: 3.2 MIL/uL — ABNORMAL LOW (ref 3.87–5.11)
RDW: 14.1 % (ref 11.5–15.5)
WBC: 8.6 10*3/uL (ref 4.0–10.5)
nRBC: 0 % (ref 0.0–0.2)

## 2021-01-06 LAB — RESP PANEL BY RT-PCR (FLU A&B, COVID) ARPGX2
Influenza A by PCR: NEGATIVE
Influenza B by PCR: NEGATIVE
SARS Coronavirus 2 by RT PCR: NEGATIVE

## 2021-01-06 MED ORDER — DOCUSATE SODIUM 100 MG PO CAPS: 100.0000 mg | ORAL_CAPSULE | Freq: Two times a day (BID) | ORAL | 0 refills | Status: DC

## 2021-01-06 MED ORDER — OXYCODONE HCL 5 MG PO TABS
5.0000 mg | ORAL_TABLET | ORAL | 0 refills | Status: DC | PRN
Start: 2021-01-06 — End: 2021-01-06

## 2021-01-06 MED ORDER — OXYCODONE HCL 5 MG PO TABS
5.0000 mg | ORAL_TABLET | ORAL | 0 refills | Status: AC | PRN
Start: 2021-01-06 — End: ?

## 2021-01-06 MED ORDER — METHOCARBAMOL 500 MG PO TABS: 500.0000 mg | ORAL_TABLET | Freq: Four times a day (QID) | ORAL | 0 refills | Status: AC | PRN

## 2021-01-06 NOTE — Progress Notes (Signed)
  Subjective: Pt stable pain ok   Objective: Vital signs in last 24 hours: Temp:  [98.9 F (37.2 C)-99.6 F (37.6 C)] 98.9 F (37.2 C) (12/01 0737) Pulse Rate:  [84-102] 84 (12/01 0746) Resp:  [13-20] 16 (12/01 0746) BP: (132-136)/(74-80) 136/80 (12/01 0737) SpO2:  [86 %-92 %] 92 % (12/01 0746)  Intake/Output from previous day: 11/30 0701 - 12/01 0700 In: 328.5 [I.V.:328.5] Out: 200 [Urine:200] Intake/Output this shift: No intake/output data recorded.  Exam:  Sensation intact distally Intact pulses distally Compartment soft  Labs: Recent Labs    01/06/21 0254  HGB 9.1*   Recent Labs    01/06/21 0254  WBC 8.6  RBC 3.20*  HCT 28.3*  PLT 174   No results for input(s): NA, K, CL, CO2, BUN, CREATININE, GLUCOSE, CALCIUM in the last 72 hours. No results for input(s): LABPT, INR in the last 72 hours.  Assessment/Plan: Plan dc to snf am if bed available   G Alphonzo Severance 01/06/2021, 8:59 AM

## 2021-01-06 NOTE — TOC Transition Note (Signed)
Transition of Care [facility]) - CM/SW Discharge Note   Patient Details  Name: [name] MRN: [id number] Date of Birth: [birth year]  [facility]) CM/SW Contact:  Joanne Chars, LCSW Phone Number: 01/06/2021, 1:32 PM   Clinical Narrative:   Pt discharging to [facility].  RN call [phone] for report.  [facility] booked until 730pm, [facility] called for transport at 75.    Final next level of care: Skilled Nursing Facility Barriers to Discharge: Barriers Resolved   Patient Goals and CMS Choice   CMS Medicare.gov Compare Post Acute Care list provided to:: Patient    Discharge Placement              Patient chooses bed at:  [facility]) Patient to be transferred to facility by: [place] Name of family member notified: LM with daughter [name] Patient and family notified of of transfer: 01/06/21  Discharge Plan and Services   Discharge Planning Services: CM Consult                                 Social Determinants of Health ([place]) Interventions     Readmission Risk Interventions No flowsheet data found.

## 2021-01-06 NOTE — Progress Notes (Signed)
Physical Therapy Treatment Patient Details Name: [name] MRN: [id number] DOB: 1950-02-12 Today's Date: 01/06/2021   History of Present Illness 70 yo female s/p R TKA on 11/29. PMH including stroke with L residual weakness (2015), Arthritis, Asthma, Gallstones, History of kidney stones, Hyperlipidemia, HTN, Pneumonia, Preoperative clearance, and Spasmodic cough (06/2017).    PT Comments    Continuing work on functional mobility and activity tolerance;  Pt reports a considerable amount of pain since this morning, and we made the goal of gettign to the bathroom (to attempt BM, and then back to bed); Employed the [place] for standing assist and for a stable way to mobilize to the bathroom (padded the knee barrier with a pillow for better comfort); 2 person heavy mod assist to stand from recliner to [place]; pt requested more time in bathroom, so ended session with bathroom call bell within reach (pt demonstrated ability to reach it with her R hand before PT left)   Recommendations for follow up therapy are one component of a multi-disciplinary discharge planning process, led by the attending physician.  Recommendations may be updated based on patient status, additional functional criteria and insurance authorization.  Follow Up Recommendations  Follow physician's recommendations for discharge plan and follow up therapies (PT currently recommends post-acute rehab at SNF maximize independence and safety with mobility  prior to dc home)     Assistance Recommended at Discharge    Equipment Recommendations  Rolling walker (2 wheels);BSC/3in1;Other (comment) (consider tub transfer bench -- can defer to OT recs for ADL DME)    Recommendations for Other Services OT consult     Precautions / Restrictions Precautions Precautions: Knee;Fall Precaution Booklet Issued: No Precaution Comments: Reviewed knee precautions and compensatory techniques. No pillow under knee Required Braces or Orthoses:  (KI in  room; no form orders) Knee Immobilizer - Right: Other (comment) (KI in the room, not in the order set) Restrictions Weight Bearing Restrictions: Yes RLE Weight Bearing: Weight bearing as tolerated     Mobility  Bed Mobility Overal bed mobility: Needs Assistance Bed Mobility: Supine to Sit     Supine to sit: Mod assist;HOB elevated     General bed mobility comments: Mod A for managing hips    Transfers Overall transfer level: Needs assistance Equipment used: Ambulation equipment used Charlaine Dalton) Transfers: Sit to/from Stand Sit to Stand: +2 physical assistance;Mod assist Stand pivot transfers: Min assist         General transfer comment: Heavy mod assist of 2 to acheive semi-stand enough for positioning of flaps of Stedy; Managed using stedy to roll into bathroom well; min assist to stand from higher stedy seat; light mod assis tto help control descent to sit Transfer via Lift Equipment: Stedy  Ambulation/Gait                   Stairs             Wheelchair Mobility    Modified Rankin (Stroke Patients Only)       Balance Overall balance assessment: Needs assistance Sitting-balance support: Feet supported;No upper extremity supported Sitting balance-Leahy Scale: Fair     Standing balance support: Bilateral upper extremity supported;During functional activity;Reliant on assistive device for balance Standing balance-Leahy Scale: Poor                              Cognition Arousal/Alertness: Awake/alert Behavior During Therapy: WFL for tasks assessed/performed Overall Cognitive Status: Within Functional Limits  for tasks assessed                                 General Comments: Very motivated despite pain        Exercises      General Comments        Pertinent Vitals/Pain Pain Assessment: Faces Faces Pain Scale: Hurts even more Pain Location: R knee` Pain Descriptors / Indicators: Grimacing;Guarding Pain  Intervention(s): Monitored during session;Other (comment) (and padded Stedy for better comfort R knee)    Home Living Family/patient expects to be discharged to:: Private residence Living Arrangements: Other relatives Biomedical scientist; out of home frequently) Available Help at Discharge: Available PRN/intermittently Type of Home: House Home Access: Stairs to enter Entrance Stairs-Rails: Can reach both Entrance Stairs-Number of Steps: 3   Home Layout: One [place]: [place] (2 wheels);Rollator (4 wheels);BSC/3in1;Tub bench      Prior Function            PT Goals (current goals can now be found in the care plan section) Acute Rehab PT Goals Patient Stated Goal: Be able to take care of her house PT Goal Formulation: With patient Time For Goal Achievement: 01/19/21 Potential to Achieve Goals: Good Progress towards PT goals: Progressing toward goals    Frequency    7X/week      PT Plan Current plan remains appropriate    Co-evaluation              AM-PAC PT "6 Clicks" Mobility   Outcome Measure  Help needed turning from your back to your side while in a flat bed without using bedrails?: A Little Help needed moving from lying on your back to sitting on the side of a flat bed without using bedrails?: A Lot Help needed moving to and from a bed to a chair (including a wheelchair)?: A Lot Help needed standing up from a chair using your arms (e.g., wheelchair or bedside chair)?: A Lot Help needed to walk in hospital room?: A Lot Help needed climbing 3-5 steps with a railing? : Total 6 Click Score: 12    End of Session Equipment Utilized During Treatment: Gait belt Activity Tolerance: Patient tolerated treatment well (despite pain) Patient left: with call bell/phone within reach;Other (comment) (sitting in bathroom) Nurse Communication: Mobility status PT Visit Diagnosis: Unsteadiness on feet (R26.81);Pain;Other abnormalities of gait and  mobility (R26.89) Pain - Right/Left: Right Pain - part of body: Knee     Time: 1200-1230 (in and out times are approximate) PT Time Calculation (min) (ACUTE ONLY): 30 min  Charges:  $Therapeutic Activity: 23-37 mins                     Roney Marion, PT  [place] Pager [phone] Office [place] 01/06/2021, 1:00 PM

## 2021-01-06 NOTE — Progress Notes (Signed)
Physician Discharge Summary      Patient ID: [name] MRN: [id number] DOB/AGE: [birth year] 70 y.o.  Admit date: 01/04/2021 Discharge date: 01/06/2021  Admission Diagnoses:  Principal Problem:   S/P total knee replacement, right Active Problems:   DJD (degenerative joint disease) of knee   Discharge Diagnoses:  Same  Surgeries: Procedure(s): RIGHT TOTAL KNEE ARTHROPLASTY on 01/04/2021   Consultants:   Discharged Condition: Stable  Hospital Course: [name] is an 70 y.o. female who was admitted 01/04/2021 with a chief complaint of right knee pain, and found to have a diagnosis of S/P total knee replacement, right.  They were brought to the operating room on 01/04/2021 and underwent the above named procedures.  Patient tolerated the procedure well.  She was ambulating with physical therapy in the room and hall by postop day #2.  She is discharged to skilled nursing in good condition.  She will follow-up with [place] in about 10 days.  Okay for weightbearing as tolerated and range of motion as tolerated.  Discharge to skilled nursing facility on oxycodone for pain Robaxin as a muscle relaxer and aspirin for DVT prophylaxis.  She will hold off on methotrexate until her return office visit.  Antibiotics given:  Anti-infectives (From admission, onward)    Start     Dose/Rate Route Frequency Ordered Stop   01/05/21 0645  ceFAZolin (ANCEF) IVPB 2g/100 mL premix        2 g 200 mL/hr over 30 Minutes Intravenous Every 8 hours 01/05/21 0556 01/05/21 1304   01/04/21 1516  vancomycin (VANCOCIN) powder  Status:  Discontinued          As needed 01/04/21 1516 01/05/21 0440   01/04/21 1045  ceFAZolin (ANCEF) IVPB 2g/100 mL premix        2 g 200 mL/hr over 30 Minutes Intravenous On call to O.R. 01/04/21 1035 01/04/21 1340   01/04/21 1040  ceFAZolin (ANCEF) 2-4 GM/100ML-% IVPB       Note to Pharmacy: Jeralene Huff M: cabinet override      01/04/21 1040 01/04/21 2244     .  Recent vital  signs:  Vitals:   01/06/21 0737 01/06/21 0746  BP: 136/80   Pulse: 98 84  Resp: 20 16  Temp: 98.9 F (37.2 C)   SpO2: (!) 86% 92%    Recent laboratory studies:  Results for orders placed or performed during the hospital encounter of 01/04/21  Resp Panel by RT-PCR (Flu A&B, Covid) Nasopharyngeal Swab   Specimen: Nasopharyngeal Swab; Nasopharyngeal(NP) swabs in vial transport medium  Result Value Ref Range   SARS Coronavirus 2 by RT PCR NEGATIVE NEGATIVE   Influenza A by PCR NEGATIVE NEGATIVE   Influenza B by PCR NEGATIVE NEGATIVE  CBC  Result Value Ref Range   WBC 8.6 4.0 - 10.5 K/uL   RBC 3.20 (L) 3.87 - 5.11 MIL/uL   Hemoglobin 9.1 (L) 12.0 - 15.0 g/dL   HCT 28.3 (L) 36.0 - 46.0 %   MCV 88.4 80.0 - 100.0 fL   MCH 28.4 26.0 - 34.0 pg   MCHC 32.2 30.0 - 36.0 g/dL   RDW 14.1 11.5 - 15.5 %   Platelets 174 150 - 400 K/uL   nRBC 0.0 0.0 - 0.2 %    Discharge Medications:   Allergies as of 01/06/2021   No Known Allergies      Medication List     STOP taking these medications    ibuprofen 600 MG tablet Commonly known  as: ADVIL   methotrexate 2.5 MG tablet Commonly known as: RHEUMATREX       TAKE these medications    albuterol 1.25 MG/3ML nebulizer solution Commonly known as: ACCUNEB Take 1 ampule by nebulization in the morning and at bedtime.   amLODipine 5 MG tablet Commonly known as: NORVASC Take 5 mg by mouth daily.   aspirin EC 81 MG tablet Take 81 mg by mouth daily.   atorvastatin 40 MG tablet Commonly known as: LIPITOR Take 1 tablet (40 mg total) by mouth daily at 6 PM.   celecoxib 200 MG capsule Commonly known as: CELEBREX Take 1 capsule (200 mg total) by mouth daily.   docusate sodium 100 MG capsule Commonly known as: COLACE Take 1 capsule (100 mg total) by mouth 2 (two) times daily.   Flutter Devi Use as directed   folic acid 1 MG tablet Commonly known as: FOLVITE Take 1 mg by mouth daily.   meclizine 25 MG tablet Commonly known  as: ANTIVERT Take 25 mg by mouth 3 (three) times daily as needed for dizziness.   methocarbamol 500 MG tablet Commonly known as: ROBAXIN Take 1 tablet (500 mg total) by mouth every 6 (six) hours as needed for muscle spasms. What changed:  when to take this reasons to take this   Nebulizer Misc by Does not apply route.   oxyCODONE 5 MG immediate release tablet Commonly known as: Oxy IR/ROXICODONE Take 1-2 tablets (5-10 mg total) by mouth every 4 (four) hours as needed for moderate pain (pain score 4-6). What changed: how much to take   potassium chloride 10 MEQ tablet Commonly known as: KLOR-CON Take 10 mEq by mouth every evening.   Trelegy Ellipta 200-62.5-25 MCG/ACT Aepb Generic drug: Fluticasone-Umeclidin-Vilant Inhale 1 puff into the lungs 3 (three) times daily as needed (cough, shortness of breath).   Vitamin D (Ergocalciferol) 1.25 MG (50000 UNIT) Caps capsule Commonly known as: DRISDOL Take 50,000 Units by mouth once a week.        Diagnostic Studies: No results found.  Disposition: Discharge disposition: 03-Skilled Nursing Facility       Discharge Instructions     Call MD / Call 911   Complete by: As directed    If you experience chest pain or shortness of breath, CALL 911 and be transported to the hospital emergency room.  If you develope a fever above 101 F, pus (white drainage) or increased drainage or redness at the wound, or calf pain, call your surgeon's office.   Constipation Prevention   Complete by: As directed    Drink plenty of fluids.  Prune juice may be helpful.  You may use a stool softener, such as Colace (over the counter) 100 mg twice a day.  Use MiraLax (over the counter) for constipation as needed.   Diet - low sodium heart healthy   Complete by: As directed    Discharge instructions   Complete by: As directed    Weightbearing as tolerated with walker CPM machine 1 hour 3 times a day for range of motion Physical therapy daily to  increase strength and range of motion Okay to shower dressing is waterproof   Increase activity slowly as tolerated   Complete by: As directed    Post-operative opioid taper instructions:   Complete by: As directed    POST-OPERATIVE OPIOID TAPER INSTRUCTIONS: It is important to wean off of your opioid medication as soon as possible. If you do not need pain medication after your surgery it  is ok to stop day one. Opioids include: Codeine, Hydrocodone(Norco, Vicodin), Oxycodone(Percocet, oxycontin) and hydromorphone amongst others.  Long term and even short term use of opiods can cause: Increased pain response Dependence Constipation Depression Respiratory depression And more.  Withdrawal symptoms can include Flu like symptoms Nausea, vomiting And more Techniques to manage these symptoms Hydrate well Eat regular healthy meals Stay active Use relaxation techniques(deep breathing, meditating, yoga) Do Not substitute Alcohol to help with tapering If you have been on opioids for less than two weeks and do not have pain than it is ok to stop all together.  Plan to wean off of opioids This plan should start within one week post op of your joint replacement. Maintain the same interval or time between taking each dose and first decrease the dose.  Cut the total daily intake of opioids by one tablet each day Next start to increase the time between doses. The last dose that should be eliminated is the evening dose.             Signed: Anderson Barber 01/06/2021, 12:34 PM

## 2021-01-06 NOTE — Evaluation (Signed)
Occupational Therapy Evaluation Patient Details Name: [name] MRN: [id number] DOB: 01/14/1951 Today's Date: 01/06/2021   History of Present Illness 70 yo female s/p R TKA on 11/29. PMH including stroke with L residual weakness (2015), Arthritis, Asthma, Gallstones, History of kidney stones, Hyperlipidemia, HTN, Pneumonia, Preoperative clearance, and Spasmodic cough (06/2017).   Clinical Impression   PTA, pt was living with her grandson (who is rarely home) and was independent; using cane/rollator as needed for mobility. Currently, pt requires Mod-Max A for LB ADLs and Min-Mod A for functional transfers using RW. Initiated education on LB ADLs and functional transfers. Pt with limitations this session due to significant pain; reports she has had increased pain since using CMP this morning and reports the strap was across her knee. Pt would benefit from further acute OT to facilitate safe dc. Recommend dc to SNF for further OT to optimize safety, independence with ADLs, and return to PLOF.     Recommendations for follow up therapy are one component of a multi-disciplinary discharge planning process, led by the attending physician.  Recommendations may be updated based on patient status, additional functional criteria and insurance authorization.   Follow Up Recommendations  Skilled nursing-short term rehab (<3 hours/day)    Assistance Recommended at Discharge Frequent or constant Supervision/Assistance  Functional Status Assessment  Patient has had a recent decline in their functional status and demonstrates the ability to make significant improvements in function in a reasonable and predictable amount of time.  Equipment Recommendations  Other (comment) (Reports her tub bench is old and she may need a new one)    Recommendations for Other Services PT consult     Precautions / Restrictions Precautions Precautions: Knee;Fall Precaution Booklet Issued: No Precaution Comments: Reviewed  knee precautions and compensatory techniques. No pillow under knee Required Braces or Orthoses:  (KI in room; no form orders) Restrictions Weight Bearing Restrictions: Yes RLE Weight Bearing: Weight bearing as tolerated      Mobility Bed Mobility Overal bed mobility: Needs Assistance Bed Mobility: Supine to Sit     Supine to sit: Mod assist;HOB elevated     General bed mobility comments: Mod A for managing hips    Transfers Overall transfer level: Needs assistance Equipment used: Rolling walker (2 wheels) Transfers: Sit to/from Stand;Bed to chair/wheelchair/BSC Sit to Stand: Mod assist;From elevated surface Stand pivot transfers: Min assist         General transfer comment: Mod A for power up. Min A for balance during pivot. Significant time due to pain      Balance Overall balance assessment: Needs assistance Sitting-balance support: Feet supported;No upper extremity supported Sitting balance-Leahy Scale: Fair     Standing balance support: Bilateral upper extremity supported;During functional activity;Reliant on assistive device for balance Standing balance-Leahy Scale: Poor                             ADL either performed or assessed with clinical judgement   ADL Overall ADL's : Needs assistance/impaired Eating/Feeding: Set up;Sitting   Grooming: Set up;Sitting   Upper Body Bathing: Set up;Supervision/ safety;Sitting   Lower Body Bathing: Moderate assistance;Sit to/from stand   Upper Body Dressing : Set up;Supervision/safety;Sitting   Lower Body Dressing: Maximal assistance;Sit to/from stand   Toilet Transfer: Minimal assistance;Stand-pivot;Rolling walker (2 wheels);Moderate assistance (simulated to recliner) Toilet Transfer Details (indicate cue type and reason): Significant pain due to pain.Mod A for power up. Min A for balance during pivot Toileting-  Clothing Manipulation and Hygiene: Maximal assistance;Sit to/from stand Toileting -  Clothing Manipulation Details (indicate cue type and reason): Max A for peri care while she maintains balance     Functional mobility during ADLs: Minimal assistance;Moderate assistance;Rolling walker (2 wheels) General ADL Comments: Very motivated by pain. Pt reporting that this morning, a NT put her RLE into the CPM and placed the strap around her knee. She reports significant pain after using CPM.     Vision         Perception     Praxis      Pertinent Vitals/Pain Pain Assessment: Faces Faces Pain Scale: Hurts whole lot Pain Location: R knee` Pain Descriptors / Indicators: Grimacing;Guarding Pain Intervention(s): Monitored during session;Repositioned;Limited activity within patient's tolerance     Hand Dominance Right   Extremity/Trunk Assessment Upper Extremity Assessment Upper Extremity Assessment: LUE deficits/detail LUE Deficits / Details: Residual weakness from CVA. Digits tendency for flexion. Able to perform AROM . LUE Coordination: decreased gross motor;decreased fine motor   Lower Extremity Assessment Lower Extremity Assessment: Defer to PT evaluation       Communication Communication Communication: No difficulties   Cognition Arousal/Alertness: Awake/alert Behavior During Therapy: WFL for tasks assessed/performed Overall Cognitive Status: Within Functional Limits for tasks assessed                                 General Comments: Very motivated despite pain     General Comments       Exercises     Shoulder Instructions      Home Living Family/patient expects to be discharged to:: Private residence Living Arrangements: Other relatives (Grandson; out of home frequently) Available Help at Discharge: Available PRN/intermittently Type of Home: House Home Access: Stairs to enter [employer] of Steps: 3 Entrance Stairs-Rails: Can reach both Home Layout: One level     Bathroom Shower/Tub: Engineer, site: Standard     Home Equipment: [place] (2 wheels);Rollator (4 wheels);BSC/3in1;Tub bench          Prior Functioning/Environment Prior Level of Function : Independent/Modified Independent             Mobility Comments: Use of cane/rollator for mobility as needed ADLs Comments: Reports she was independent with ADLs and IADLs        OT Problem List: Decreased strength;Decreased range of motion;Decreased activity tolerance;Impaired balance (sitting and/or standing);Decreased knowledge of use of DME or AE;Decreased knowledge of precautions;Pain      OT Treatment/Interventions: Self-care/ADL training;Therapeutic exercise;Energy conservation;DME and/or AE instruction;Therapeutic activities;Patient/family education    OT Goals(Current goals can be found in the care plan section) Acute Rehab OT Goals Patient Stated Goal: Reduce pain OT Goal Formulation: With patient Time For Goal Achievement: 01/20/21 Potential to Achieve Goals: Good  OT Frequency: Min 2X/week   Barriers to D/C: Decreased caregiver support          Co-evaluation              AM-PAC OT "6 Clicks" Daily Activity     Outcome Measure Help from another person eating meals?: None Help from another person taking care of personal grooming?: A Little Help from another person toileting, which includes using toliet, bedpan, or urinal?: A Lot Help from another person bathing (including washing, rinsing, drying)?: A Lot Help from another person to put on and taking off regular upper body clothing?: A Little Help from another person  to put on and taking off regular lower body clothing?: A Lot 6 Click Score: 16   End of Session Equipment Utilized During Treatment: Gait belt;Rolling walker (2 wheels) CPM Right Knee CPM Right Knee: Off Nurse Communication: Mobility status  Activity Tolerance: Patient limited by pain Patient left: in chair;with call bell/phone within reach;with chair alarm  set  OT Visit Diagnosis: Unsteadiness on feet (R26.81);Other abnormalities of gait and mobility (R26.89);Muscle weakness (generalized) (M62.81);Pain Pain - Right/Left: Right Pain - part of body: Knee                Time: 1030-1059 OT Time Calculation (min): 29 min Charges:  OT General Charges $OT Visit: 1 Visit OT Evaluation $OT Eval Moderate Complexity: 1 Mod OT Treatments $Self Care/Home Management : 8-22 mins  Sergi Gellner MSOT, OTR/L Acute Rehab Pager: [phone] Office: [place] 01/06/2021, 11:20 AM

## 2021-01-06 NOTE — Progress Notes (Signed)
Physical Therapy Treatment Patient Details Name: [name] MRN: [id number] DOB: [birth year] Today's Date: 01/06/2021   History of Present Illness 70 yo female s/p R TKA on 11/29. PMH including stroke with L residual weakness (2015), Arthritis, Asthma, Gallstones, History of kidney stones, Hyperlipidemia, HTN, Pneumonia, Preoperative clearance, and Spasmodic cough (06/2017).    PT Comments    Continuing work on functional mobility and activity tolerance;  Session focused on standing/transfer training and continued consideration of using the stedy for getting OOB to the bathroom; Overall tolerated well; noted plans to dc to SNF for rehab later today   Recommendations for follow up therapy are one component of a multi-disciplinary discharge planning process, led by the attending physician.  Recommendations may be updated based on patient status, additional functional criteria and insurance authorization.  Follow Up Recommendations  Follow physician's recommendations for discharge plan and follow up therapies     Assistance Recommended at Discharge Intermittent Supervision/Assistance  Equipment Recommendations  Rolling walker (2 wheels);BSC/3in1;Other (comment) (consider tub transfer bench -- can defer to OT recs for ADL DME)    Recommendations for Other Services OT consult     Precautions / Restrictions Precautions Precautions: Knee;Fall Precaution Comments: Reviewed knee precautions and compensatory techniques. No pillow under knee Knee Immobilizer - Right: Other (comment) (KI in the room, not in the order set) Restrictions RLE Weight Bearing: Weight bearing as tolerated Other Position/Activity Restrictions: Please be aware she has had a stroke, and has residual weakness in her L (non-operative, and therefore less painful) side     Mobility  Bed Mobility Overal bed mobility: Needs Assistance Bed Mobility: Sit to Supine       Sit to supine: Min assist   General bed mobility  comments: Min assist to help RLE into bed; tehn cues for optimal positioning in bed    Transfers Overall transfer level: Needs assistance Equipment used: Ambulation equipment used Transfers: Sit to/from Stand Sit to Stand: +2 physical assistance;Mod assist           General transfer comment: Min assist to stand from taller stedy-seat height Transfer via Lift Equipment: Stedy  Ambulation/Gait                   Stairs             Wheelchair Mobility    Modified Rankin (Stroke Patients Only)       Balance     Sitting balance-Leahy Scale: Fair       Standing balance-Leahy Scale: Poor Standing balance comment: Stood in stedy with bil UE support; cues to activate quad and gluteals for stance stability; able to maintian balance in standing while moving stedy from bathroom back to bed                            Cognition Arousal/Alertness: Awake/alert Behavior During Therapy: WFL for tasks assessed/performed Overall Cognitive Status: Within Functional Limits for tasks assessed                                 General Comments: Very motivated despite pain        Exercises      General Comments General comments (skin integrity, edema, etc.): assisted pt with hygeine, cleaning after using the bathroom; no BM yet      Pertinent Vitals/Pain Pain Assessment: Faces Pain Score: 8  Faces Pain Scale: Hurts  little more Pain Location: R knee` Pain Descriptors / Indicators: Grimacing;Guarding Pain Intervention(s): Monitored during session    Home Living                          Prior Function            PT Goals (current goals can now be found in the care plan section) Acute Rehab PT Goals Patient Stated Goal: Be able to take care of her house PT Goal Formulation: With patient Time For Goal Achievement: 01/19/21 Potential to Achieve Goals: Good Progress towards PT goals: Progressing toward goals     Frequency    7X/week      PT Plan Current plan remains appropriate    Co-evaluation              AM-PAC PT "6 Clicks" Mobility   Outcome Measure  Help needed turning from your back to your side while in a flat bed without using bedrails?: A Little Help needed moving from lying on your back to sitting on the side of a flat bed without using bedrails?: A Lot Help needed moving to and from a bed to a chair (including a wheelchair)?: A Lot Help needed standing up from a chair using your arms (e.g., wheelchair or bedside chair)?: A Lot Help needed to walk in hospital room?: A Lot Help needed climbing 3-5 steps with a railing? : Total 6 Click Score: 12    End of Session Equipment Utilized During Treatment: Gait belt Activity Tolerance: Patient tolerated treatment well Patient left: in bed;with call bell/phone within reach;with bed alarm set Nurse Communication: Mobility status PT Visit Diagnosis: Unsteadiness on feet (R26.81);Pain;Other abnormalities of gait and mobility (R26.89) Pain - Right/Left: Right Pain - part of body: Knee     Time: 6606-3016 PT Time Calculation (min) (ACUTE ONLY): 13 min  Charges:  $Therapeutic Activity: 8-22 mins                     Roney Marion, PT  Acute Rehabilitation Services Pager [phone] Office [place] 01/06/2021, 4:10 PM

## 2021-01-06 NOTE — Progress Notes (Signed)
01/06/2021 spoke to Dr Tempie Donning (orthopedic) cncering patient heart rate 118. Pt is asymptomatic. Per MD it is ok for the patient to go to the facility today. [facility] RN.

## 2021-01-06 NOTE — Progress Notes (Signed)
Pt was repositioned q2 to 3 hours w/assist and given prn meds for pain. No acute s&s of distress noted at this time. Pt slept when pain was under control. Meds tolerated well. Surgical foam dressing in place. Will continue to monitor.

## 2021-01-07 ENCOUNTER — Telehealth: Payer: Self-pay | Admitting: Orthopedic Surgery

## 2021-01-07 NOTE — Telephone Encounter (Signed)
Received call from [name] (PT) with [facility] needing to know when is the patient suppose to wear the knee immobilizer? The number to contact Gaspar Bidding is [phone]

## 2021-01-07 NOTE — Telephone Encounter (Signed)
Discussed with Luke--IC advised ok to d/c when she is sitting. Needs to wear when walking around until she is able to do at least 10 SLR on her own

## 2021-01-19 ENCOUNTER — Ambulatory Visit (INDEPENDENT_AMBULATORY_CARE_PROVIDER_SITE_OTHER): Payer: Medicare HMO | Admitting: Orthopedic Surgery

## 2021-01-19 ENCOUNTER — Other Ambulatory Visit: Payer: Self-pay

## 2021-01-19 ENCOUNTER — Ambulatory Visit (INDEPENDENT_AMBULATORY_CARE_PROVIDER_SITE_OTHER): Payer: Medicare HMO

## 2021-01-19 DIAGNOSIS — Z9889 Other specified postprocedural states: Secondary | ICD-10-CM

## 2021-01-19 NOTE — Discharge Summary (Signed)
Physician Discharge Summary      Patient ID: [name] MRN: [id number] DOB/AGE: [birth year] 70 y.o.  Admit date: 01/04/2021 Discharge date: 01/06/2021  Admission Diagnoses:  Principal Problem:   S/P total knee replacement, right Active Problems:   DJD (degenerative joint disease) of knee   Discharge Diagnoses:  Same  Surgeries: Procedure(s): RIGHT TOTAL KNEE ARTHROPLASTY on 01/04/2021   Consultants:   Discharged Condition: Stable  Hospital Course: [name] is an 70 y.o. female who was admitted 01/04/2021 with a chief complaint of right knee pain, and found to have a diagnosis of right knee osteoarthritis.  They were brought to the operating room on 01/04/2021 and underwent the above named procedures.  Pt awoke from anesthesia without complication and was transferred to the floor. On POD1, patient struggled with mobility to some degree.  She did progress with mobility throughout her stay.  She was discharged to SNF on 01/06/2021.[name]  Pt will f/u with Dr. Marlou Sa in clinic in ~2 weeks.   Antibiotics given:  Anti-infectives (From admission, onward)    Start     Dose/Rate Route Frequency Ordered Stop   01/05/21 0645  ceFAZolin (ANCEF) IVPB 2g/100 mL premix        2 g 200 mL/hr over 30 Minutes Intravenous Every 8 hours 01/05/21 0556 01/05/21 1304   01/04/21 1516  vancomycin (VANCOCIN) powder  Status:  Discontinued          As needed 01/04/21 1516 01/05/21 0440   01/04/21 1045  ceFAZolin (ANCEF) IVPB 2g/100 mL premix        2 g 200 mL/hr over 30 Minutes Intravenous On call to O.R. 01/04/21 1035 01/04/21 1340   01/04/21 1040  ceFAZolin (ANCEF) 2-4 GM/100ML-% IVPB       Note to Pharmacy: Jeralene Huff M: cabinet override      01/04/21 1040 01/04/21 2244     .  Recent vital signs:  Vitals:   01/06/21 0746 01/06/21 1755  BP:  135/76  Pulse: 84   Resp: 16 18  Temp:  99.2 F (37.3 C)  SpO2: 92% 92%    Recent laboratory studies:  Results for orders placed or performed  during the hospital encounter of 01/04/21  Resp Panel by RT-PCR (Flu A&B, Covid) Nasopharyngeal Swab   Specimen: Nasopharyngeal Swab; Nasopharyngeal(NP) swabs in vial transport medium  Result Value Ref Range   SARS Coronavirus 2 by RT PCR NEGATIVE NEGATIVE   Influenza A by PCR NEGATIVE NEGATIVE   Influenza B by PCR NEGATIVE NEGATIVE  CBC  Result Value Ref Range   WBC 8.6 4.0 - 10.5 K/uL   RBC 3.20 (L) 3.87 - 5.11 MIL/uL   Hemoglobin 9.1 (L) 12.0 - 15.0 g/dL   HCT 28.3 (L) 36.0 - 46.0 %   MCV 88.4 80.0 - 100.0 fL   MCH 28.4 26.0 - 34.0 pg   MCHC 32.2 30.0 - 36.0 g/dL   RDW 14.1 11.5 - 15.5 %   Platelets 174 150 - 400 K/uL   nRBC 0.0 0.0 - 0.2 %    Discharge Medications:   Allergies as of 01/06/2021   No Known Allergies      Medication List     STOP taking these medications    ibuprofen 600 MG tablet Commonly known as: ADVIL   methotrexate 2.5 MG tablet Commonly known as: RHEUMATREX       TAKE these medications    albuterol 1.25 MG/3ML nebulizer solution Commonly known as: ACCUNEB Take 1 ampule by  nebulization in the morning and at bedtime.   amLODipine 5 MG tablet Commonly known as: NORVASC Take 5 mg by mouth daily.   aspirin EC 81 MG tablet Take 81 mg by mouth daily.   atorvastatin 40 MG tablet Commonly known as: LIPITOR Take 1 tablet (40 mg total) by mouth daily at 6 PM.   celecoxib 200 MG capsule Commonly known as: CELEBREX Take 1 capsule (200 mg total) by mouth daily.   docusate sodium 100 MG capsule Commonly known as: COLACE Take 1 capsule (100 mg total) by mouth 2 (two) times daily.   Flutter Devi Use as directed   folic acid 1 MG tablet Commonly known as: FOLVITE Take 1 mg by mouth daily.   meclizine 25 MG tablet Commonly known as: ANTIVERT Take 25 mg by mouth 3 (three) times daily as needed for dizziness.   methocarbamol 500 MG tablet Commonly known as: ROBAXIN Take 1 tablet (500 mg total) by mouth every 6 (six) hours as needed for  muscle spasms. What changed:  when to take this reasons to take this   Nebulizer Misc by Does not apply route.   oxyCODONE 5 MG immediate release tablet Commonly known as: Oxy IR/ROXICODONE Take 1-2 tablets (5-10 mg total) by mouth every 4 (four) hours as needed for moderate pain (pain score 4-6). What changed: how much to take   potassium chloride 10 MEQ tablet Commonly known as: KLOR-CON Take 10 mEq by mouth every evening.   Trelegy Ellipta 200-62.5-25 MCG/ACT Aepb Generic drug: Fluticasone-Umeclidin-Vilant Inhale 1 puff into the lungs 3 (three) times daily as needed (cough, shortness of breath).   Vitamin D (Ergocalciferol) 1.25 MG (50000 UNIT) Caps capsule Commonly known as: DRISDOL Take 50,000 Units by mouth once a week.        Diagnostic Studies: No results found.  Disposition: Discharge disposition: 03-Skilled Nursing Facility       Discharge Instructions     Call MD / Call 911   Complete by: As directed    If you experience chest pain or shortness of breath, CALL 911 and be transported to the hospital emergency room.  If you develope a fever above 101 F, pus (white drainage) or increased drainage or redness at the wound, or calf pain, call your surgeon's office.   Constipation Prevention   Complete by: As directed    Drink plenty of fluids.  Prune juice may be helpful.  You may use a stool softener, such as Colace (over the counter) 100 mg twice a day.  Use MiraLax (over the counter) for constipation as needed.   Diet - low sodium heart healthy   Complete by: As directed    Discharge instructions   Complete by: As directed    Weightbearing as tolerated with walker CPM machine 1 hour 3 times a day for range of motion Physical therapy daily to increase strength and range of motion Okay to shower dressing is waterproof   Increase activity slowly as tolerated   Complete by: As directed    Post-operative opioid taper instructions:   Complete by: As  directed    POST-OPERATIVE OPIOID TAPER INSTRUCTIONS: It is important to wean off of your opioid medication as soon as possible. If you do not need pain medication after your surgery it is ok to stop day one. Opioids include: Codeine, Hydrocodone(Norco, Vicodin), Oxycodone(Percocet, oxycontin) and hydromorphone amongst others.  Long term and even short term use of opiods can cause: Increased pain response Dependence Constipation Depression Respiratory depression  And more.  Withdrawal symptoms can include Flu like symptoms Nausea, vomiting And more Techniques to manage these symptoms Hydrate well Eat regular healthy meals Stay active Use relaxation techniques(deep breathing, meditating, yoga) Do Not substitute Alcohol to help with tapering If you have been on opioids for less than two weeks and do not have pain than it is ok to stop all together.  Plan to wean off of opioids This plan should start within one week post op of your joint replacement. Maintain the same interval or time between taking each dose and first decrease the dose.  Cut the total daily intake of opioids by one tablet each day Next start to increase the time between doses. The last dose that should be eliminated is the evening dose.             SignedDonella Stade 01/19/2021, 11:26 PM

## 2021-01-23 ENCOUNTER — Encounter: Payer: Self-pay | Admitting: Orthopedic Surgery

## 2021-01-23 NOTE — Progress Notes (Signed)
Post-Op Visit Note   Patient: [name]           Date of Birth: [birth year]           MRN: [id number] Visit Date: 01/19/2021 PCP: Nolene Ebbs, MD   Assessment & Plan:  Chief Complaint:  Chief Complaint  Patient presents with   Right Knee - Routine Post Op    01/04/21 right TKA   Visit Diagnoses:  1. Post-operative state     Plan: [name] is a 70 year old patient who is now about 2 weeks out right total knee replacement.  Having some spasm at night in the quad.  She is at [employer].  States that she is doing therapy about 45 minutes a day.  Walks with a walker.  She is doing the stairs.  On exam she has about 70 degrees of flexion and a 15 to 18 degree flexion contracture.  Plan is 4-week return.  Home health physical therapy for 4 weeks to really work on extension more than flexion.  Follow-up in 4 weeks for clinical recheck.  No calf tenderness negative Homans today.  Incision intact.  Follow-Up Instructions: No follow-ups on file.   Orders:  Orders Placed This Encounter  Procedures   XR Knee 1-2 Views Right   No orders of the defined types were placed in this encounter.   Imaging: No results found.  PMFS History: Patient Active Problem List   Diagnosis Date Noted   DJD (degenerative joint disease) of knee 01/05/2021   S/P total knee replacement, right 01/04/2021   Arthritis of left knee 07/17/2019   Asthma 06/12/2017   Hyperlipidemia 06/12/2017   Seizures ([place]) 06/12/2017   Seizure ([place]) 06/12/2017   GERD (gastroesophageal reflux disease) 10/31/2016   Chronic rhinitis 10/31/2016   Chronic cholecystitis 04/12/2015   Preoperative clearance 03/17/2015   Spastic hemiplegia affecting nondominant side ([place]) 10/27/2013   Adhesive capsulitis of right shoulder 10/27/2013   Chronic diastolic heart failure ([place]) 08/26/2013   Dyslipidemia 08/26/2013   Borderline diabetes 08/26/2013   Cerebral infarction ([place]) 08/26/2013   Acute CVA (cerebrovascular accident)  ([place]) 08/23/2013   Pre-syncope 08/17/2012   Hypokalemia 08/17/2012   ARTHRITIS 03/28/2010   DE QUERVAIN'S TENOSYNOVITIS 03/28/2010   Normocytic anemia 07/26/2009   Moderate persistent asthma 03/09/2009   DENTAL CARIES 03/09/2009   CANDIDIASIS OF VULVA AND VAGINA 06/04/2008   SKIN TAG 06/04/2008   ARTHRITIS, KNEES, BILATERAL 05/22/2008   HYPERTENSION, BENIGN ESSENTIAL 05/04/2008   KNEE PAIN, BILATERAL 05/04/2008   Cough, persistent 05/04/2008   MENISCUS TEAR, RIGHT 12/07/2005   Past Medical History:  Diagnosis Date   Arthritis    Asthma    Difficulty sleeping    Gallstones    History of kidney stones    Hyperlipidemia    Hypertension    Pneumonia    Preoperative clearance    Spasmodic cough 06/2017   "IT COMES IN DIFFERENT SEASONS"   Stroke ([place]) 2015   WEAKNESS L SIDE OF BODY     Family History  Problem Relation Age of Onset   Diabetes Mother    Colon cancer Father 71   Colon polyps Neg Hx    Esophageal cancer Neg Hx    Rectal cancer Neg Hx    Stomach cancer Neg Hx     Past Surgical History:  Procedure Laterality Date   CHOLECYSTECTOMY N/A 04/12/2015   Procedure: LAPAROSCOPIC CHOLECYSTECTOMY WITH INTRAOPERATIVE CHOLANGIOGRAM;  Surgeon: Greer Pickerel, MD;  Location: [facility];  Service: General;  Laterality: N/A;   gall stones     KNEE ARTHROSCOPY  2010   TOTAL KNEE ARTHROPLASTY Left 07/17/2019   Procedure: LEFT TOTAL KNEE ARTHROPLASTY;  Surgeon: Meredith Pel, MD;  Location: [facility];  Service: Orthopedics;  Laterality: Left;   TOTAL KNEE ARTHROPLASTY Right 01/04/2021   Procedure: RIGHT TOTAL KNEE ARTHROPLASTY;  Surgeon: Meredith Pel, MD;  Location: [facility];  Service: Orthopedics;  Laterality: Right;   TUBAL LIGATION     Social History   Occupational History   Not on file  Tobacco Use   Smoking status: Former    Types: Cigarettes    Quit date: 04/08/1994    Years since quitting: 26.8   Smokeless tobacco: Never  Vaping Use   Vaping Use: Never used   Substance and Sexual Activity   Alcohol use: No   Drug use: No   Sexual activity: Not on file

## 2021-01-27 ENCOUNTER — Telehealth: Payer: Self-pay

## 2021-01-27 NOTE — Telephone Encounter (Signed)
Received call from [name] with HHPT stating patient was lacking 30 degrees in leg extension and she was only getting to 70 degrees of flexion.  I discussed this with Dr Marlou Sa. He wrote order for [employer] for patient to get knee extension brace to assist with contracture. I called and spoke with patient at length about this. She will have someone get order and take her to [employer] to be fitted for this brace. I expressed importance of this to her. She verbalized understanding. Also scheduled follow up appt with Dr Marlou Sa since this was not scheduled when she left her last OV on 12/14.  I encouraged patient to call us with any questions/concerns.

## 2021-01-31 DIAGNOSIS — M1711 Unilateral primary osteoarthritis, right knee: Secondary | ICD-10-CM

## 2021-02-02 ENCOUNTER — Telehealth: Payer: Self-pay | Admitting: Orthopedic Surgery

## 2021-02-02 NOTE — Telephone Encounter (Signed)
Mallory (OT) from [facility] called requesting verbal orders for OT home health for 1 wk 2. Please call Mallory at [phone] or leave detailed message on secure line if unable to answer.

## 2021-02-03 NOTE — Telephone Encounter (Signed)
IC verbal given.  

## 2021-02-11 ENCOUNTER — Ambulatory Visit (INDEPENDENT_AMBULATORY_CARE_PROVIDER_SITE_OTHER): Payer: Medicare HMO | Admitting: Orthopedic Surgery

## 2021-02-11 ENCOUNTER — Other Ambulatory Visit: Payer: Self-pay

## 2021-02-11 ENCOUNTER — Encounter: Payer: Self-pay | Admitting: Orthopedic Surgery

## 2021-02-11 DIAGNOSIS — Z96651 Presence of right artificial knee joint: Secondary | ICD-10-CM

## 2021-02-11 NOTE — Progress Notes (Signed)
Post-Op Visit Note   Patient: [name]           Date of Birth: [birth year]           MRN: [id number] Visit Date: 02/11/2021 PCP: Nolene Ebbs, MD   Assessment & Plan:  Chief Complaint:  Chief Complaint  Patient presents with   Right Knee - Routine Post Op   Visit Diagnoses:  1. Status post right knee replacement     Plan: Patient is a 71 year old female who presents s/p right total knee arthroplasty on 01/04/2021.  She is ambulating with a cane and feels that she is doing well with some continued soreness at times.  Denies any fevers, chills, night sweats, chest pain, shortness of breath, calf pain.  She has no difficulty ascending or descending stairs by her history.  No difficulty with getting up from a low toilet seat.  She is at home now back from the skilled nursing facility.  Doing home health physical therapy 3 times per week where she is focusing on extension.  She is using the blue cradle boot for 2 to 3 hours a day where she is in the boot for 5 minutes and then out for 2-minute break then back in for 5 minutes and so on.  On exam she has about a 15 degree flexion contracture with 90 degrees of flexion today.  No calf tenderness.  Negative Homans' sign.  Incision is well-healed.  Hard endpoint with extension.  Excellent quad strength rated 5/5.  Intact ankle dorsiflexion plantarflexion.  There is  Plan is continue with home health physical therapy.  Though she subjectively feels that she is getting along well, when she ambulates she is clearly having difficulty due to the flexion contracture.  Patient was given a prescription for dynamic splint brace to obtain from an orthotic store.  She will follow-up in 3 months for clinical recheck regarding her extension primarily.  We will call the office with any questions or concerns in the meantime.  Follow-Up Instructions: No follow-ups on file.   Orders:  No orders of the defined types were placed in this encounter.  No  orders of the defined types were placed in this encounter.   Imaging: No results found.  PMFS History: Patient Active Problem List   Diagnosis Date Noted   Arthritis of right knee    DJD (degenerative joint disease) of knee 01/05/2021   S/P total knee replacement, right 01/04/2021   Arthritis of left knee 07/17/2019   Asthma 06/12/2017   Hyperlipidemia 06/12/2017   Seizures ([place]) 06/12/2017   Seizure ([place]) 06/12/2017   GERD (gastroesophageal reflux disease) 10/31/2016   Chronic rhinitis 10/31/2016   Chronic cholecystitis 04/12/2015   Preoperative clearance 03/17/2015   Spastic hemiplegia affecting nondominant side ([place]) 10/27/2013   Adhesive capsulitis of right shoulder 10/27/2013   Chronic diastolic heart failure ([place]) 08/26/2013   Dyslipidemia 08/26/2013   Borderline diabetes 08/26/2013   Cerebral infarction ([place]) 08/26/2013   Acute CVA (cerebrovascular accident) ([place]) 08/23/2013   Pre-syncope 08/17/2012   Hypokalemia 08/17/2012   ARTHRITIS 03/28/2010   DE QUERVAIN'S TENOSYNOVITIS 03/28/2010   Normocytic anemia 07/26/2009   Moderate persistent asthma 03/09/2009   DENTAL CARIES 03/09/2009   CANDIDIASIS OF VULVA AND VAGINA 06/04/2008   SKIN TAG 06/04/2008   ARTHRITIS, KNEES, BILATERAL 05/22/2008   HYPERTENSION, BENIGN ESSENTIAL 05/04/2008   KNEE PAIN, BILATERAL 05/04/2008   Cough, persistent 05/04/2008   MENISCUS TEAR, RIGHT 12/07/2005   Past Medical History:  Diagnosis  Date   Arthritis    Asthma    Difficulty sleeping    Gallstones    History of kidney stones    Hyperlipidemia    Hypertension    Pneumonia    Preoperative clearance    Spasmodic cough 06/2017   "IT COMES IN DIFFERENT SEASONS"   Stroke ([place]) 2015   WEAKNESS L SIDE OF BODY     Family History  Problem Relation Age of Onset   Diabetes Mother    Colon cancer Father 16   Colon polyps Neg Hx    Esophageal cancer Neg Hx    Rectal cancer Neg Hx    Stomach cancer Neg Hx     Past Surgical  History:  Procedure Laterality Date   CHOLECYSTECTOMY N/A 04/12/2015   Procedure: LAPAROSCOPIC CHOLECYSTECTOMY WITH INTRAOPERATIVE CHOLANGIOGRAM;  Surgeon: Greer Pickerel, MD;  Location: [facility];  Service: General;  Laterality: N/A;   gall stones     KNEE ARTHROSCOPY  2010   TOTAL KNEE ARTHROPLASTY Left 07/17/2019   Procedure: LEFT TOTAL KNEE ARTHROPLASTY;  Surgeon: Meredith Pel, MD;  Location: [facility];  Service: Orthopedics;  Laterality: Left;   TOTAL KNEE ARTHROPLASTY Right 01/04/2021   Procedure: RIGHT TOTAL KNEE ARTHROPLASTY;  Surgeon: Meredith Pel, MD;  Location: [facility];  Service: Orthopedics;  Laterality: Right;   TUBAL LIGATION     Social History   Occupational History   Not on file  Tobacco Use   Smoking status: Former    Types: Cigarettes    Quit date: 04/08/1994    Years since quitting: 26.8   Smokeless tobacco: Never  Vaping Use   Vaping Use: Never used  Substance and Sexual Activity   Alcohol use: No   Drug use: No   Sexual activity: Not on file

## 2021-02-24 ENCOUNTER — Other Ambulatory Visit: Payer: Self-pay | Admitting: Internal Medicine

## 2021-02-24 ENCOUNTER — Ambulatory Visit
Admission: RE | Admit: 2021-02-24 | Discharge: 2021-02-24 | Disposition: A | Discharge status: Home / Self Care | Payer: Medicare HMO | Source: Ambulatory Visit | Attending: Internal Medicine | Admitting: Internal Medicine

## 2021-02-24 ENCOUNTER — Other Ambulatory Visit: Payer: Self-pay

## 2021-02-24 DIAGNOSIS — Z1231 Encounter for screening mammogram for malignant neoplasm of breast: Secondary | ICD-10-CM

## 2021-02-24 DIAGNOSIS — E2839 Other primary ovarian failure: Secondary | ICD-10-CM

## 2021-02-25 ENCOUNTER — Telehealth: Payer: Self-pay | Admitting: Surgical

## 2021-02-25 NOTE — Telephone Encounter (Signed)
02/11/21 OV note faxed to [facility]

## 2021-03-16 ENCOUNTER — Emergency Department (HOSPITAL_COMMUNITY)
Admission: EM | Admit: 2021-03-16 | Discharge: 2021-03-17 | Disposition: A | Discharge status: Home / Self Care | Payer: Medicare HMO | Attending: Emergency Medicine | Admitting: Emergency Medicine

## 2021-03-16 ENCOUNTER — Other Ambulatory Visit: Payer: Self-pay

## 2021-03-16 ENCOUNTER — Encounter (HOSPITAL_COMMUNITY): Payer: Self-pay | Admitting: *Deleted

## 2021-03-16 ENCOUNTER — Emergency Department (HOSPITAL_COMMUNITY): Payer: Medicare HMO

## 2021-03-16 DIAGNOSIS — Z96651 Presence of right artificial knee joint: Secondary | ICD-10-CM | POA: Diagnosis not present

## 2021-03-16 DIAGNOSIS — M25561 Pain in right knee: Secondary | ICD-10-CM | POA: Diagnosis present

## 2021-03-16 DIAGNOSIS — Z5321 Procedure and treatment not carried out due to patient leaving prior to being seen by health care provider: Secondary | ICD-10-CM | POA: Insufficient documentation

## 2021-03-16 NOTE — ED Provider Triage Note (Signed)
Emergency Medicine Provider Triage Evaluation Note  [name] , a 71 y.o. female  was evaluated in triage.  Pt complains of right knee pain.  Patient states that she had a knee replacement back in November, has been followed up since then.  Patient reports earlier today she fell forward landing on her right knee.  Patient now in pain.  Review of Systems  Positive: Right knee pain Negative: Nausea, vomiting, chest pain, shortness of breath, weakness  Physical Exam  BP 133/83 (BP Location: Right Arm)    Pulse (!) 116    Temp 99.6 F (37.6 C) (Oral)    Resp 18    Ht 5\' 3"  (1.6 m)    Wt 103.7 kg    LMP  (LMP Unknown)    SpO2 98%    BMI 40.50 kg/m  Gen:   Awake, no distress   Resp:  Normal effort  MSK:   Moves extremities without difficulty  Other:    Medical Decision Making  Medically screening exam initiated at 5:13 PM.  Appropriate orders placed.  [name] was informed that the remainder of the evaluation will be completed by another provider, this initial triage assessment does not replace that evaluation, and the importance of remaining in the ED until their evaluation is complete.     Azucena Cecil, PA-C 03/16/21 1714

## 2021-03-16 NOTE — ED Triage Notes (Signed)
The pt had a rt knee replacement nov 29th  she was sitting in her chair and slid out of the chair and onto the floor  onto her rt knee.painful

## 2021-03-17 ENCOUNTER — Ambulatory Visit (INDEPENDENT_AMBULATORY_CARE_PROVIDER_SITE_OTHER): Payer: Medicare HMO

## 2021-03-17 ENCOUNTER — Ambulatory Visit: Payer: Self-pay

## 2021-03-17 ENCOUNTER — Ambulatory Visit (INDEPENDENT_AMBULATORY_CARE_PROVIDER_SITE_OTHER): Payer: Medicare HMO | Admitting: Orthopedic Surgery

## 2021-03-17 DIAGNOSIS — M25562 Pain in left knee: Secondary | ICD-10-CM

## 2021-03-17 DIAGNOSIS — M25561 Pain in right knee: Secondary | ICD-10-CM

## 2021-03-17 NOTE — ED Notes (Signed)
No answer for VSx3

## 2021-03-18 ENCOUNTER — Encounter: Payer: Self-pay | Admitting: Orthopedic Surgery

## 2021-03-18 NOTE — Progress Notes (Signed)
Office Visit Note   Patient: [name]           Date of Birth: August 09, 1950           MRN: [id number] Visit Date: 03/17/2021 Requested by: Nolene Ebbs, MD [address] [place],  [place] [address] PCP: Nolene Ebbs, MD  Subjective: Chief Complaint  Patient presents with   Right Knee - Pain   Left Knee - Pain    HPI: Emmely is a 71 year old patient underwent right total knee replacement 01/04/2021.  [place] off her bed 2 days ago and onto her knee.  She reports mild difficulty weightbearing due to pain.  On examination she does have about a 20 degree at least flexion contracture but can bend it to 90 degrees.  Extensor mechanism intact.  No groin pain with internal and external rotation of the right leg.  Radiographs negative for fracture.  Plan at this time is to continue to work as much and as hard as possible on range of motion particularly full extension.  Follow-up appointment to be maintained from March.              ROS: See above  Assessment & Plan: Visit Diagnoses:  1. Pain in both knees, unspecified chronicity     Plan: See above  Follow-Up Instructions: Return if symptoms worsen or fail to improve.   Orders:  Orders Placed This Encounter  Procedures   XR Knee 1-2 Views Right   XR Knee 1-2 Views Left   No orders of the defined types were placed in this encounter.     Procedures: No procedures performed   Clinical Data: No additional findings.  Objective: Vital Signs: LMP  (LMP Unknown)   Physical Exam: See above  Ortho Exam: See above  Specialty Comments:  No specialty comments available.  Imaging: No results found.   PMFS History: Patient Active Problem List   Diagnosis Date Noted   Arthritis of right knee    DJD (degenerative joint disease) of knee 01/05/2021   S/P total knee replacement, right 01/04/2021   Arthritis of left knee 07/17/2019   Asthma 06/12/2017   Hyperlipidemia 06/12/2017   Seizures ([place]) 06/12/2017   Seizure ([place])  06/12/2017   GERD (gastroesophageal reflux disease) 10/31/2016   Chronic rhinitis 10/31/2016   Chronic cholecystitis 04/12/2015   Preoperative clearance 03/17/2015   Spastic hemiplegia affecting nondominant side ([place]) 10/27/2013   Adhesive capsulitis of right shoulder 10/27/2013   Chronic diastolic heart failure ([place]) 08/26/2013   Dyslipidemia 08/26/2013   Borderline diabetes 08/26/2013   Cerebral infarction ([place]) 08/26/2013   Acute CVA (cerebrovascular accident) ([place]) 08/23/2013   Pre-syncope 08/17/2012   Hypokalemia 08/17/2012   ARTHRITIS 03/28/2010   DE QUERVAIN'S TENOSYNOVITIS 03/28/2010   Normocytic anemia 07/26/2009   Moderate persistent asthma 03/09/2009   DENTAL CARIES 03/09/2009   CANDIDIASIS OF VULVA AND VAGINA 06/04/2008   SKIN TAG 06/04/2008   ARTHRITIS, KNEES, BILATERAL 05/22/2008   HYPERTENSION, BENIGN ESSENTIAL 05/04/2008   KNEE PAIN, BILATERAL 05/04/2008   Cough, persistent 05/04/2008   MENISCUS TEAR, RIGHT 12/07/2005   Past Medical History:  Diagnosis Date   Arthritis    Asthma    Difficulty sleeping    Gallstones    History of kidney stones    Hyperlipidemia    Hypertension    Pneumonia    Preoperative clearance    Spasmodic cough 06/2017   "IT COMES IN DIFFERENT SEASONS"   Stroke ([place]) 2015   WEAKNESS L SIDE OF BODY  Family History  Problem Relation Age of Onset   Diabetes Mother    Colon cancer Father 13   Colon polyps Neg Hx    Esophageal cancer Neg Hx    Rectal cancer Neg Hx    Stomach cancer Neg Hx     Past Surgical History:  Procedure Laterality Date   CHOLECYSTECTOMY N/A 04/12/2015   Procedure: LAPAROSCOPIC CHOLECYSTECTOMY WITH INTRAOPERATIVE CHOLANGIOGRAM;  Surgeon: Greer Pickerel, MD;  Location: [facility];  Service: General;  Laterality: N/A;   gall stones     KNEE ARTHROSCOPY  2010   TOTAL KNEE ARTHROPLASTY Left 07/17/2019   Procedure: LEFT TOTAL KNEE ARTHROPLASTY;  Surgeon: Meredith Pel, MD;  Location: [facility];  Service:  Orthopedics;  Laterality: Left;   TOTAL KNEE ARTHROPLASTY Right 01/04/2021   Procedure: RIGHT TOTAL KNEE ARTHROPLASTY;  Surgeon: Meredith Pel, MD;  Location: [facility];  Service: Orthopedics;  Laterality: Right;   TUBAL LIGATION     Social History   Occupational History   Not on file  Tobacco Use   Smoking status: Former    Types: Cigarettes    Quit date: 04/08/1994    Years since quitting: 26.9   Smokeless tobacco: Never  Vaping Use   Vaping Use: Never used  Substance and Sexual Activity   Alcohol use: No   Drug use: No   Sexual activity: Not on file

## 2021-03-21 ENCOUNTER — Telehealth: Payer: Self-pay

## 2021-03-21 NOTE — Telephone Encounter (Signed)
Pt called and would like to know if she can have something sent in for pain.. please advise

## 2021-03-24 ENCOUNTER — Other Ambulatory Visit: Payer: Self-pay | Admitting: Surgical

## 2021-03-24 MED ORDER — CELECOXIB 100 MG PO CAPS: 100.0000 mg | ORAL_CAPSULE | Freq: Two times a day (BID) | ORAL | 0 refills | Status: DC

## 2021-03-24 NOTE — Telephone Encounter (Signed)
Tried calling to advise done.

## 2021-03-24 NOTE — Telephone Encounter (Signed)
Sent in RX for Celebrex which she has taken in the past

## 2021-03-31 ENCOUNTER — Other Ambulatory Visit: Payer: Medicare HMO

## 2021-05-18 ENCOUNTER — Ambulatory Visit: Payer: Medicare HMO | Admitting: Orthopedic Surgery

## 2021-05-27 ENCOUNTER — Ambulatory Visit (INDEPENDENT_AMBULATORY_CARE_PROVIDER_SITE_OTHER): Payer: Medicare HMO

## 2021-05-27 ENCOUNTER — Ambulatory Visit: Payer: Medicare HMO | Admitting: Orthopedic Surgery

## 2021-05-27 DIAGNOSIS — Z96651 Presence of right artificial knee joint: Secondary | ICD-10-CM

## 2021-05-27 MED ORDER — CAPSAICIN 0.1 % EX CREA: TOPICAL_CREAM | CUTANEOUS | 0 refills | Status: DC

## 2021-05-28 ENCOUNTER — Encounter: Payer: Self-pay | Admitting: Orthopedic Surgery

## 2021-05-28 NOTE — Progress Notes (Signed)
? ?Office Visit Note ?  ?Patient: [name]           ?Date of Birth: February 12, 1950           ?MRN: [id number] ?Visit Date: 05/27/2021 ?Requested by: Nolene Ebbs, MD ?[address] ?[place],  [place] [address] ?PCP: Nolene Ebbs, MD ? ?Subjective: ?Chief Complaint  ?Patient presents with  ? Right Knee - Routine Post Op  ? ? ?HPI: Brigitt is a 71 year old patient who is about 5 months out from right total knee replacement.  She has fallen twice in March.  Reports pain in the right knee along with stiffness.  She did have a very difficult time regaining range of motion.  Currently she really wants to try to just live with what she has.  She lives with her grandchildren at home.  She does use a walker.  She tries for about 1 hour a day to straighten her leg.  She has had a stroke which affects her left arm and left leg.  She did try some topical from her sister which helped her. ?             ?ROS: All systems reviewed are negative as they relate to the chief complaint within the history of present illness.  Patient denies  fevers or chills. ? ? ?Assessment & Plan: ?Visit Diagnoses:  ?1. Status post right knee replacement   ? ? ?Plan: Impression is right knee arthrofibrosis which is not preventing Brittlyn from ambulating with a walker.  No evidence of periprosthetic fracture at this time.  Knee remains stiff.  Intervention would be potentially risky.  She does not really want to consider intervention at this time.  Prescription for topical patient cream written.  She will follow-up as needed. ? ?Follow-Up Instructions: Return if symptoms worsen or fail to improve.  ? ?Orders:  ?Orders Placed This Encounter  ?Procedures  ? XR Knee 1-2 Views Right  ? ?Meds ordered this encounter  ?Medications  ? Capsaicin 0.1 % CREA  ?  Sig: Apply to knee daily prn  ?  Dispense:  60 g  ?  Refill:  0  ? ? ? ? Procedures: ?No procedures performed ? ? ?Clinical Data: ?No additional findings. ? ?Objective: ?Vital Signs: LMP  (LMP Unknown)   ? ?Physical Exam:  ? ?Constitutional: Patient appears well-developed ?HEENT:  ?Head: Normocephalic ?Eyes:EOM are normal ?Neck: Normal range of motion ?Cardiovascular: Normal rate ?Pulmonary/chest: Effort normal ?Neurologic: Patient is alert ?Skin: Skin is warm ?Psychiatric: Patient has normal mood and affect ? ? ?Ortho Exam: Ortho exam demonstrates palpable pedal pulses in the right foot.  No effusion in the right knee.  Knee has limited range of motion from about 30 to 60 degrees.  No groin pain on either side hip with internal and external rotation.  Does have dorsiflexion weakness on the left side which is consistent with her history of stroke. ? ?Specialty Comments:  ?No specialty comments available. ? ?Imaging: ?XR Knee 1-2 Views Right ? ?Result Date: 05/28/2021 ?AP lateral radiographs right knee reviewed.  Total knee prosthesis in good position alignment with no complicating features.  No lucencies around the bone cement interface.  ? ? ?PMFS History: ?Patient Active Problem List  ? Diagnosis Date Noted  ? Arthritis of right knee   ? DJD (degenerative joint disease) of knee 01/05/2021  ? S/P total knee replacement, right 01/04/2021  ? Arthritis of left knee 07/17/2019  ? Asthma 06/12/2017  ? Hyperlipidemia 06/12/2017  ?  Seizures ([place]) 06/12/2017  ? Seizure ([place] [facility]) 06/12/2017  ? GERD (gastroesophageal reflux disease) 10/31/2016  ? Chronic rhinitis 10/31/2016  ? Chronic cholecystitis 04/12/2015  ? Preoperative clearance 03/17/2015  ? Spastic hemiplegia affecting nondominant side ([place]) 10/27/2013  ? Adhesive capsulitis of right shoulder 10/27/2013  ? Chronic diastolic heart failure ([place]) 08/26/2013  ? Dyslipidemia 08/26/2013  ? Borderline diabetes 08/26/2013  ? Cerebral infarction ([facility]) 08/26/2013  ? Acute CVA (cerebrovascular accident) ([place]) 08/23/2013  ? Pre-syncope 08/17/2012  ? Hypokalemia 08/17/2012  ? ARTHRITIS 03/28/2010  ? DE QUERVAIN'S TENOSYNOVITIS 03/28/2010  ? Normocytic anemia 07/26/2009  ? Moderate  persistent asthma 03/09/2009  ? DENTAL CARIES 03/09/2009  ? CANDIDIASIS OF VULVA AND VAGINA 06/04/2008  ? SKIN TAG 06/04/2008  ? ARTHRITIS, KNEES, BILATERAL 05/22/2008  ? HYPERTENSION, BENIGN ESSENTIAL 05/04/2008  ? KNEE PAIN, BILATERAL 05/04/2008  ? Cough, persistent 05/04/2008  ? MENISCUS TEAR, RIGHT 12/07/2005  ? ?Past Medical History:  ?Diagnosis Date  ? Arthritis   ? Asthma   ? Difficulty sleeping   ? Gallstones   ? History of kidney stones   ? Hyperlipidemia   ? Hypertension   ? Pneumonia   ? Preoperative clearance   ? Spasmodic cough 06/2017  ? "IT COMES IN DIFFERENT SEASONS"  ? Stroke [facility]) 2015  ? WEAKNESS L SIDE OF BODY   ?  ?Family History  ?Problem Relation Age of Onset  ? Diabetes Mother   ? Colon cancer Father 12  ? Colon polyps Neg Hx   ? Esophageal cancer Neg Hx   ? Rectal cancer Neg Hx   ? Stomach cancer Neg Hx   ?  ?Past Surgical History:  ?Procedure Laterality Date  ? CHOLECYSTECTOMY N/A 04/12/2015  ? Procedure: LAPAROSCOPIC CHOLECYSTECTOMY WITH INTRAOPERATIVE CHOLANGIOGRAM;  Surgeon: Greer Pickerel, MD;  [facility];  Service: General;  Laterality: N/A;  ? gall stones    ? KNEE ARTHROSCOPY  2010  ? TOTAL KNEE ARTHROPLASTY Left 07/17/2019  ? Procedure: LEFT TOTAL KNEE ARTHROPLASTY;  Surgeon: Meredith Pel, MD;  Location: [facility];  Service: Orthopedics;  Laterality: Left;  ? TOTAL KNEE ARTHROPLASTY Right 01/04/2021  ? Procedure: RIGHT TOTAL KNEE ARTHROPLASTY;  Surgeon: Meredith Pel, MD;  Location: [facility];  Service: Orthopedics;  Laterality: Right;  ? TUBAL LIGATION    ? ?Social History  ? ?Occupational History  ? Not on file  ?Tobacco Use  ? Smoking status: Former  ?  Types: Cigarettes  ?  Quit date: 04/08/1994  ?  Years since quitting: 27.1  ? Smokeless tobacco: Never  ?Vaping Use  ? Vaping Use: Never used  ?Substance and Sexual Activity  ? Alcohol use: No  ? Drug use: No  ? Sexual activity: Not on file  ? ? ? ? ? ?

## 2021-05-31 ENCOUNTER — Emergency Department (HOSPITAL_COMMUNITY): Payer: Medicare HMO

## 2021-05-31 ENCOUNTER — Other Ambulatory Visit: Payer: Self-pay

## 2021-05-31 ENCOUNTER — Observation Stay (HOSPITAL_COMMUNITY): Payer: Medicare HMO

## 2021-05-31 ENCOUNTER — Observation Stay (HOSPITAL_COMMUNITY)
Admission: EM | Admit: 2021-05-31 | Discharge: 2021-06-01 | Disposition: A | Discharge status: Home / Self Care | Payer: Medicare HMO | Attending: Family Medicine | Admitting: Family Medicine

## 2021-05-31 ENCOUNTER — Encounter (HOSPITAL_COMMUNITY): Payer: Self-pay

## 2021-05-31 DIAGNOSIS — R55 Syncope and collapse: Secondary | ICD-10-CM | POA: Diagnosis not present

## 2021-05-31 DIAGNOSIS — G459 Transient cerebral ischemic attack, unspecified: Secondary | ICD-10-CM | POA: Diagnosis present

## 2021-05-31 DIAGNOSIS — I110 Hypertensive heart disease with heart failure: Secondary | ICD-10-CM | POA: Insufficient documentation

## 2021-05-31 DIAGNOSIS — E785 Hyperlipidemia, unspecified: Secondary | ICD-10-CM | POA: Diagnosis not present

## 2021-05-31 DIAGNOSIS — Z87891 Personal history of nicotine dependence: Secondary | ICD-10-CM | POA: Insufficient documentation

## 2021-05-31 DIAGNOSIS — D649 Anemia, unspecified: Secondary | ICD-10-CM | POA: Diagnosis not present

## 2021-05-31 DIAGNOSIS — Z79899 Other long term (current) drug therapy: Secondary | ICD-10-CM | POA: Diagnosis not present

## 2021-05-31 DIAGNOSIS — G8110 Spastic hemiplegia affecting unspecified side: Secondary | ICD-10-CM | POA: Diagnosis present

## 2021-05-31 DIAGNOSIS — Z8673 Personal history of transient ischemic attack (TIA), and cerebral infarction without residual deficits: Secondary | ICD-10-CM | POA: Insufficient documentation

## 2021-05-31 DIAGNOSIS — K219 Gastro-esophageal reflux disease without esophagitis: Secondary | ICD-10-CM

## 2021-05-31 DIAGNOSIS — I5032 Chronic diastolic (congestive) heart failure: Secondary | ICD-10-CM | POA: Insufficient documentation

## 2021-05-31 DIAGNOSIS — Z7982 Long term (current) use of aspirin: Secondary | ICD-10-CM | POA: Insufficient documentation

## 2021-05-31 DIAGNOSIS — I10 Essential (primary) hypertension: Secondary | ICD-10-CM

## 2021-05-31 DIAGNOSIS — Z96653 Presence of artificial knee joint, bilateral: Secondary | ICD-10-CM | POA: Diagnosis not present

## 2021-05-31 DIAGNOSIS — G40909 Epilepsy, unspecified, not intractable, without status epilepticus: Secondary | ICD-10-CM | POA: Diagnosis not present

## 2021-05-31 DIAGNOSIS — J4540 Moderate persistent asthma, uncomplicated: Secondary | ICD-10-CM | POA: Diagnosis not present

## 2021-05-31 DIAGNOSIS — R569 Unspecified convulsions: Secondary | ICD-10-CM

## 2021-05-31 DIAGNOSIS — R4182 Altered mental status, unspecified: Secondary | ICD-10-CM | POA: Diagnosis present

## 2021-05-31 LAB — COMPREHENSIVE METABOLIC PANEL
ALT: 26 U/L (ref 0–44)
AST: 19 U/L (ref 15–41)
Albumin: 3.3 g/dL — ABNORMAL LOW (ref 3.5–5.0)
Alkaline Phosphatase: 79 U/L (ref 38–126)
Anion gap: 8 (ref 5–15)
BUN: 11 mg/dL (ref 8–23)
CO2: 27 mmol/L (ref 22–32)
Calcium: 9.2 mg/dL (ref 8.9–10.3)
Chloride: 105 mmol/L (ref 98–111)
Creatinine, Ser: 0.58 mg/dL (ref 0.44–1.00)
GFR, Estimated: >60 mL/min (ref >60)
Glucose, Bld: 102 mg/dL — ABNORMAL HIGH (ref 70–99)
Potassium: 3.5 mmol/L (ref 3.5–5.1)
Sodium: 140 mmol/L (ref 135–145)
Total Bilirubin: 0.5 mg/dL (ref 0.3–1.2)
Total Protein: 7.8 g/dL (ref 6.5–8.1)

## 2021-05-31 LAB — CBC WITH DIFFERENTIAL/PLATELET
Abs Immature Granulocytes: 0.02 10*3/uL (ref 0.00–0.07)
Basophils Absolute: 0 10*3/uL (ref 0.0–0.1)
Basophils Relative: 1 %
Eosinophils Absolute: 0.3 10*3/uL (ref 0.0–0.5)
Eosinophils Relative: 6 %
HCT: 34.1 % — ABNORMAL LOW (ref 36.0–46.0)
Hemoglobin: 10.7 g/dL — ABNORMAL LOW (ref 12.0–15.0)
Immature Granulocytes: 0 %
Lymphocytes Relative: 33 %
Lymphs Abs: 1.5 10*3/uL (ref 0.7–4.0)
MCH: 27 pg (ref 26.0–34.0)
MCHC: 31.4 g/dL (ref 30.0–36.0)
MCV: 85.9 fL (ref 80.0–100.0)
Monocytes Absolute: 0.5 10*3/uL (ref 0.1–1.0)
Monocytes Relative: 11 %
Neutro Abs: 2.2 10*3/uL (ref 1.7–7.7)
Neutrophils Relative %: 49 %
Platelets: 294 10*3/uL (ref 150–400)
RBC: 3.97 MIL/uL (ref 3.87–5.11)
RDW: 15.1 % (ref 11.5–15.5)
WBC: 4.5 10*3/uL (ref 4.0–10.5)
nRBC: 0 % (ref 0.0–0.2)

## 2021-05-31 MED ORDER — ENOXAPARIN SODIUM 40 MG/0.4ML IJ SOSY
40.0000 mg | PREFILLED_SYRINGE | INTRAMUSCULAR | Status: DC
  Administered 2021-05-31: 40 mg via SUBCUTANEOUS
  Filled 2021-05-31: qty 0.4

## 2021-05-31 MED ORDER — ALBUTEROL SULFATE (2.5 MG/3ML) 0.083% IN NEBU: 3.0000 mL | INHALATION_SOLUTION | Freq: Four times a day (QID) | RESPIRATORY_TRACT | Status: DC | PRN

## 2021-05-31 MED ORDER — ACETAMINOPHEN 160 MG/5ML PO SOLN: 650.0000 mg | ORAL | Status: DC | PRN

## 2021-05-31 MED ORDER — FLUTICASONE FUROATE-VILANTEROL 200-25 MCG/ACT IN AEPB
1.0000 | INHALATION_SPRAY | Freq: Every day | RESPIRATORY_TRACT | Status: DC
  Administered 2021-06-01: 1 via RESPIRATORY_TRACT
  Filled 2021-05-31: qty 28

## 2021-05-31 MED ORDER — AMLODIPINE BESYLATE 5 MG PO TABS
5.0000 mg | ORAL_TABLET | Freq: Every day | ORAL | Status: DC
  Administered 2021-06-01: 5 mg via ORAL
  Filled 2021-05-31: qty 1

## 2021-05-31 MED ORDER — MECLIZINE HCL 25 MG PO TABS: 25.0000 mg | ORAL_TABLET | Freq: Three times a day (TID) | ORAL | Status: DC | PRN

## 2021-05-31 MED ORDER — ACETAMINOPHEN 325 MG PO TABS: 650.0000 mg | ORAL_TABLET | ORAL | Status: DC | PRN

## 2021-05-31 MED ORDER — ASPIRIN EC 81 MG PO TBEC
81.0000 mg | DELAYED_RELEASE_TABLET | Freq: Every day | ORAL | Status: DC
  Administered 2021-06-01: 81 mg via ORAL
  Filled 2021-05-31: qty 1

## 2021-05-31 MED ORDER — ACETAMINOPHEN 650 MG RE SUPP: 650.0000 mg | RECTAL | Status: DC | PRN

## 2021-05-31 MED ORDER — UMECLIDINIUM BROMIDE 62.5 MCG/ACT IN AEPB
1.0000 | INHALATION_SPRAY | Freq: Every day | RESPIRATORY_TRACT | Status: DC
  Administered 2021-06-01: 1 via RESPIRATORY_TRACT
  Filled 2021-05-31: qty 7

## 2021-05-31 MED ORDER — SENNOSIDES-DOCUSATE SODIUM 8.6-50 MG PO TABS: 1.0000 | ORAL_TABLET | Freq: Every evening | ORAL | Status: DC | PRN

## 2021-05-31 MED ORDER — ATORVASTATIN CALCIUM 40 MG PO TABS: 40.0000 mg | ORAL_TABLET | Freq: Every day | ORAL | Status: DC

## 2021-05-31 NOTE — ED Provider Notes (Signed)
?[facility] ?Provider Note ? ? ?CSN: [id number] ?Arrival date & time: 05/31/21  1531 ? ?  ? ?History ? ?No chief complaint on file. ? ? ?[name] is a 71 y.o. female. ? ?Patient is a 71 year old female who presents with an episode of altered mental status.  Per chart review, she has a history of hypertension, hyperlipidemia, GERD, she had a CVA in 2015 with residual left-sided hemiparesis.  She also had new onset seizures in 2019 which was felt to be related to irritability from a subdural hygroma.  She said today she was eating a piece of pie.  Following that she started having a little cramping in her abdomen.  She felt like she needed to go to the bathroom and have a bowel movement.  She went to lie down on her bed.  She was lying on her bed and felt a little worse.  She yelled out for her grandson.  At that time, apparently she was having a hard time speaking/answering questions.  It is unclear whether she lost consciousness.  On EMS arrival, they felt like she had some right-sided facial drooping and was diaphoretic.  She was repetitively answering questions with yes but was able to follow commands.  At 1455, EMS reports that her speech became clear and she seemed to be back to baseline.  She denies any vision changes.  No current difficulty speaking.  She has some baseline weakness in her left leg and arm.  No new numbness or weakness in her extremities.  No abdominal pain.  No headache.  No dizziness.  No chest pain or shortness of breath.  No palpitations.  No recent illnesses. ? ? ?  ? ?Home Medications ?Prior to Admission medications   ?Medication Sig Start Date End Date Taking? Authorizing Provider  ?albuterol (ACCUNEB) 1.25 MG/3ML nebulizer solution Take 1 ampule by nebulization in the morning and at bedtime.   Yes [provider]  ?amLODipine (NORVASC) 5 MG tablet Take 5 mg by mouth daily. 12/14/20  Yes [provider]  ?aspirin EC 81 MG tablet  Take 81 mg by mouth daily.   Yes [provider]  ?celecoxib (CELEBREX) 100 MG capsule Take 1 capsule (100 mg total) by mouth 2 (two) times daily. 03/24/21 03/24/22 Yes Magnant, Gerrianne Scale, PA-C  ?Fluticasone-Umeclidin-Vilant (TRELEGY ELLIPTA) 200-62.5-25 MCG/INH AEPB Inhale 1 puff into the lungs 3 (three) times daily as needed (cough, shortness of breath).   Yes [provider]  ?folic acid (FOLVITE) 1 MG tablet Take 1 mg by mouth daily.   Yes [provider]  ?methocarbamol (ROBAXIN) 500 MG tablet Take 1 tablet (500 mg total) by mouth every 6 (six) hours as needed for muscle spasms. 01/06/21  Yes Meredith Pel, MD  ?methotrexate (RHEUMATREX) 2.5 MG tablet Take 10 mg by mouth once a week. 05/30/21  Yes [provider]  ?oxyCODONE (OXY IR/ROXICODONE) 5 MG immediate release tablet Take 1-2 tablets (5-10 mg total) by mouth every 4 (four) hours as needed for moderate pain (pain score 4-6). 01/06/21  Yes Magnant, Charles L, PA-C  ?potassium chloride (KLOR-CON) 10 MEQ tablet Take 10 mEq by mouth every evening.   Yes [provider]  ?Vitamin D, Ergocalciferol, (DRISDOL) 1.25 MG (50000 UNIT) CAPS capsule Take 50,000 Units by mouth once a week. 04/24/19  Yes [provider]  ?atorvastatin (LIPITOR) 40 MG tablet Take 1 tablet (40 mg total) by mouth daily at 6 PM. ?Patient not taking: Reported  on 05/31/2021 09/18/13   Cathlyn Parsons, PA-C  ?Capsaicin 0.1 % CREA Apply to knee daily prn ?Patient not taking: Reported on 05/31/2021 05/27/21   Meredith Pel, MD  ?docusate sodium (COLACE) 100 MG capsule Take 1 capsule (100 mg total) by mouth 2 (two) times daily. ?Patient not taking: Reported on 05/31/2021 01/06/21   Meredith Pel, MD  ?meclizine (ANTIVERT) 25 MG tablet Take 25 mg by mouth 3 (three) times daily as needed for dizziness. ?Patient not taking: Reported on 05/31/2021 04/28/19   [provider]  ?Nebulizer MISC by Does not apply route.    [provider]  ?Respiratory Therapy Supplies (FLUTTER) DEVI Use as directed 10/31/16   Bobbitt, Sedalia Muta, MD  ?   ? ?Allergies    ?Patient has no known allergies.   ? ?Review of Systems   ?Review of Systems  ?Constitutional:  Positive for fatigue. Negative for chills, diaphoresis and fever.  ?HENT:  Negative for congestion, rhinorrhea and sneezing.   ?Eyes: Negative.   ?Respiratory:  Negative for cough, chest tightness and shortness of breath.   ?Cardiovascular:  Negative for chest pain and leg swelling.  ?Gastrointestinal:  Positive for abdominal pain. Negative for blood in stool, diarrhea, nausea and vomiting.  ?Genitourinary:  Negative for difficulty urinating, flank pain, frequency and hematuria.  ?Musculoskeletal:  Negative for arthralgias and back pain.  ?Skin:  Negative for rash.  ?Neurological:  Positive for speech difficulty. Negative for dizziness, weakness, numbness and headaches.  ? ?Physical Exam ?Updated Vital Signs ?BP (!) 164/80   Pulse 96   Temp 98.9 ?F (37.2 ?C)   Resp (!) 21   LMP  (LMP Unknown)   SpO2 95%  ?Physical Exam ?Constitutional:   ?   Appearance: She is well-developed.  ?HENT:  ?   Head: Normocephalic and atraumatic.  ?Eyes:  ?   Pupils: Pupils are equal, round, and reactive to light.  ?Cardiovascular:  ?   Rate and Rhythm: Normal rate and regular rhythm.  ?   Heart sounds: Normal heart sounds.  ?Pulmonary:  ?   Effort: Pulmonary effort is normal. No respiratory distress.  ?   Breath sounds: Normal breath sounds. No wheezing or rales.  ?Chest:  ?   Chest wall: No tenderness.  ?Abdominal:  ?   General: Bowel sounds are normal.  ?   Palpations: Abdomen is soft.  ?   Tenderness: There is no abdominal tenderness. There is no guarding or rebound.  ?Musculoskeletal:     ?   General: Normal range of motion.  ?   Cervical back: Normal range of motion and neck supple.  ?Lymphadenopathy:  ?   Cervical: No cervical adenopathy.  ?Skin: ?   General: Skin is warm and dry.  ?   Findings: No  rash.  ?Neurological:  ?   Mental Status: She is alert and oriented to person, place, and time.  ?   Comments: Motor 3 out of 5 in the left arm and left leg, 4 out of 5 in the right upper and lower extremities, sensation grossly intact to light touch all extremities, cranial nerves II through XII grossly intact  ? ? ?ED Results / Procedures / Treatments   ?Labs ?(all labs ordered are listed, but only abnormal results are displayed) ?Labs Reviewed  ?COMPREHENSIVE METABOLIC PANEL - Abnormal; Notable for the following components:  ?    Result Value  ? Glucose, Bld 102 (*)   ? Albumin 3.3 (*)   ?  All other components within normal limits  ?CBC WITH DIFFERENTIAL/PLATELET - Abnormal; Notable for the following components:  ? Hemoglobin 10.7 (*)   ? HCT 34.1 (*)   ? All other components within normal limits  ?URINALYSIS, ROUTINE W REFLEX MICROSCOPIC  ?LIPID PANEL  ?HEMOGLOBIN A1C  ? ? ?EKG ?EKG Interpretation ? ?Date/Time:  Tuesday May 31 2021 18:13:01 EDT ?Ventricular Rate:  86 ?PR Interval:  226 ?QRS Duration: 84 ?QT Interval:  396 ?QTC Calculation: 469 ?R Axis:   53 ?Text Interpretation: Sinus rhythm Prolonged PR interval Abnormal R-wave progression, early transition Consider left ventricular hypertrophy since last tracing no significant change Confirmed by Malvin Johns [phone]) on 05/31/2021 6:38:13 PM ? ?Radiology ?CT Head Wo Contrast ? ?Result Date: 05/31/2021 ?CLINICAL DATA:  Altered mental status EXAM: CT HEAD WITHOUT CONTRAST TECHNIQUE: Contiguous axial images were obtained from the base of the skull through the vertex without intravenous contrast. RADIATION DOSE REDUCTION: This exam was performed according to the departmental dose-optimization program which includes automated exposure control, adjustment of the mA and/or kV according to patient size and/or use of iterative reconstruction technique. COMPARISON:  06/12/2017 FINDINGS: Brain: There is prominent CSF space in the periphery of right frontal and parietal  lobes suggesting chronic subdural hygroma measuring up to 10 mm in thickness. This finding has not changed significantly. There is 3.5 x 2.5 cm fluid density structure in the anterior right middle cranial foss

## 2021-05-31 NOTE — H&P (Addendum)
History and Physical   [name] ZOX:[id number] DOB: February 19, 1950 DOA: 05/31/2021  PCP: Fleet Contras, MD   Patient coming from: Home  Chief Complaint: Transient neurologic deficit   HPI: [name] is a 71 y.o. female with medical history significant of anemia, hypertension, asthma, CHF, CVD with residual left-sided deficits, hyperlipidemia, GERD, seizures who presents following transient neurologic deficit and transient altered mental status.  So history obtained assistance of chart review.  Reportedly had gone to lie down after having some abdominal cramping after eating pie.  Later, grandson noted that she was not responding to him but seemed to be trying to communicate using hand signals.  She reports that she was worried she was having another stroke.  EMS was called and on arrival they noted some right facial droop and diaphoresis and the patient was repeatedly saying "yes ", but was able to follow commands.  She did return to her baseline in route and was at her baseline in the ED.  Patient and family do also reports spells where patient becomes unresponsive and is not even staring off but, has her eyes rolled back.  The last 2 to 3 minutes and have let her just stop driving.  She states that sometimes it seems to be triggered by coughing.  Does have history of seizure.  Patient denies fevers, chills, chest pain, shortness of breath, abdominal pain, constipation, diarrhea, nausea, vomiting.  ED Course: Vital signs in the ED significant for blood pressure in the 120s to 160s systolic.  Lab work-up included CMP with glucose 102, albumin 3.3.  CBC showed hemoglobin stable at 10.7.  Urinalysis pending.  CT head showed no acute abnormality but did show chronic stable hygroma.  Neurology consulted by the EDP who recommended MRI and EEG and call them back if abnormal.  Review of Systems: As per HPI otherwise all other systems reviewed and are negative.  Past Medical History:  Diagnosis  Date   Arthritis    Asthma    Difficulty sleeping    Gallstones    History of kidney stones    Hyperlipidemia    Hypertension    Pneumonia    Preoperative clearance    Spasmodic cough 06/2017   "IT COMES IN DIFFERENT SEASONS"   Stroke (HCC) 2015   WEAKNESS L SIDE OF BODY     Past Surgical History:  Procedure Laterality Date   CHOLECYSTECTOMY N/A 04/12/2015   Procedure: LAPAROSCOPIC CHOLECYSTECTOMY WITH INTRAOPERATIVE CHOLANGIOGRAM;  Surgeon: Gaynelle Adu, MD;  Location: [facility];  Service: General;  Laterality: N/A;   gall stones     KNEE ARTHROSCOPY  2010   TOTAL KNEE ARTHROPLASTY Left 07/17/2019   Procedure: LEFT TOTAL KNEE ARTHROPLASTY;  Surgeon: Cammy Copa, MD;  Location: [facility] OR;  Service: Orthopedics;  Laterality: Left;   TOTAL KNEE ARTHROPLASTY Right 01/04/2021   Procedure: RIGHT TOTAL KNEE ARTHROPLASTY;  Surgeon: Cammy Copa, MD;  Location: [facility] OR;  Service: Orthopedics;  Laterality: Right;   TUBAL LIGATION      Social History  reports that she quit smoking about 27 years ago. Her smoking use included cigarettes. She has never used smokeless tobacco. She reports that she does not drink alcohol and does not use drugs.  No Known Allergies  Family History  Problem Relation Age of Onset   Diabetes Mother    Colon cancer Father 3   Colon polyps Neg Hx    Esophageal cancer Neg Hx    Rectal cancer Neg Hx  Stomach cancer Neg Hx   Reviewed on admission  Prior to Admission medications   Medication Sig Start Date End Date Taking? Authorizing Provider  albuterol (ACCUNEB) 1.25 MG/3ML nebulizer solution Take 1 ampule by nebulization in the morning and at bedtime.    [provider]  amLODipine (NORVASC) 5 MG tablet Take 5 mg by mouth daily. 12/14/20   [provider]  aspirin EC 81 MG tablet Take 81 mg by mouth daily.    [provider]  atorvastatin (LIPITOR) 40 MG tablet Take 1 tablet (40 mg total) by mouth daily at 6 PM. 09/18/13    Angiulli, Mcarthur Rossetti, PA-C  Capsaicin 0.1 % CREA Apply to knee daily prn 05/27/21   Cammy Copa, MD  celecoxib (CELEBREX) 100 MG capsule Take 1 capsule (100 mg total) by mouth 2 (two) times daily. 03/24/21 03/24/22  Magnant, Charles L, PA-C  docusate sodium (COLACE) 100 MG capsule Take 1 capsule (100 mg total) by mouth 2 (two) times daily. 01/06/21   Cammy Copa, MD  Fluticasone-Umeclidin-Vilant (TRELEGY ELLIPTA) 200-62.5-25 MCG/INH AEPB Inhale 1 puff into the lungs 3 (three) times daily as needed (cough, shortness of breath).    [provider]  folic acid (FOLVITE) 1 MG tablet Take 1 mg by mouth daily.    [provider]  meclizine (ANTIVERT) 25 MG tablet Take 25 mg by mouth 3 (three) times daily as needed for dizziness. 04/28/19   [provider]  methocarbamol (ROBAXIN) 500 MG tablet Take 1 tablet (500 mg total) by mouth every 6 (six) hours as needed for muscle spasms. 01/06/21   Cammy Copa, MD  Nebulizer MISC by Does not apply route.    [provider]  oxyCODONE (OXY IR/ROXICODONE) 5 MG immediate release tablet Take 1-2 tablets (5-10 mg total) by mouth every 4 (four) hours as needed for moderate pain (pain score 4-6). 01/06/21   Magnant, Charles L, PA-C  potassium chloride (KLOR-CON) 10 MEQ tablet Take 10 mEq by mouth every evening.    [provider]  Respiratory Therapy Supplies (FLUTTER) DEVI Use as directed 10/31/16   Bobbitt, Heywood Iles, MD  Vitamin D, Ergocalciferol, (DRISDOL) 1.25 MG (50000 UNIT) CAPS capsule Take 50,000 Units by mouth once a week. 04/24/19   [provider]    Physical Exam: Vitals:   05/31/21 2000 05/31/21 2015 05/31/21 2030 05/31/21 2045  BP: (!) 156/84  (!) 164/80   Pulse: 95 95 90 96  Resp: 14 19 20  (!) 21  Temp: 98.9 F (37.2 C)     SpO2: 100% 100% 100% 95%    Physical Exam Constitutional:      General: She is not in acute distress.    Appearance: Normal appearance.  HENT:      Head: Normocephalic and atraumatic.     Mouth/Throat:     Mouth: Mucous membranes are moist.     Pharynx: Oropharynx is clear.  Eyes:     Extraocular Movements: Extraocular movements intact.     Pupils: Pupils are equal, round, and reactive to light.  Cardiovascular:     Rate and Rhythm: Normal rate and regular rhythm.     Pulses: Normal pulses.     Heart sounds: Normal heart sounds.  Pulmonary:     Effort: Pulmonary effort is normal. No respiratory distress.     Breath sounds: Normal breath sounds.  Abdominal:     General: Bowel sounds are normal. There is no distension.     Palpations: Abdomen  is soft.     Tenderness: There is no abdominal tenderness.  Musculoskeletal:        General: No swelling or deformity.  Skin:    General: Skin is warm and dry.  Neurological:     Comments: Mental Status: Patient is awake, alert, oriented x3 No signs of aphasia or neglect Cranial Nerves: II: Pupils equal, round, and reactive to light.   III,IV, VI: EOMI without ptosis or diploplia.  V: Facial sensation is symmetric to light touch. VII: Facial movement is symmetric.  VIII: hearing is intact to voice X: Uvula elevates symmetrically XI: Shoulder shrug is symmetric. XII: tongue is midline without atrophy or fasciculations.  Motor: Good effort thorughout, 5/5 RUE, 3/5 LUE, 5/5 RLE, 3-4/5 LLE Sensory: Sensation is grossly intact bilateral UEs & LEs Cerebellar: Finger-Nose intact on right, left limited by weakness.     Labs on Admission: I have personally reviewed following labs and imaging studies  CBC: Recent Labs  Lab 05/31/21 1629  WBC 4.5  NEUTROABS 2.2  HGB 10.7*  HCT 34.1*  MCV 85.9  PLT 294    Basic Metabolic Panel: Recent Labs  Lab 05/31/21 1629  NA 140  K 3.5  CL 105  CO2 27  GLUCOSE 102*  BUN 11  CREATININE 0.58  CALCIUM 9.2    GFR: CrCl cannot be calculated (Unknown ideal weight.).  Liver Function Tests: Recent Labs  Lab 05/31/21 1629  AST 19   ALT 26  ALKPHOS 79  BILITOT 0.5  PROT 7.8  ALBUMIN 3.3*    Urine analysis:    Component Value Date/Time   COLORURINE YELLOW 12/28/2020 1400   APPEARANCEUR HAZY (A) 12/28/2020 1400   LABSPEC 1.017 12/28/2020 1400   PHURINE 6.0 12/28/2020 1400   GLUCOSEU NEGATIVE 12/28/2020 1400   HGBUR NEGATIVE 12/28/2020 1400   HGBUR trace-intact 03/28/2010 1550   BILIRUBINUR NEGATIVE 12/28/2020 1400   KETONESUR NEGATIVE 12/28/2020 1400   PROTEINUR NEGATIVE 12/28/2020 1400   UROBILINOGEN 0.2 08/24/2013 0428   NITRITE NEGATIVE 12/28/2020 1400   LEUKOCYTESUR NEGATIVE 12/28/2020 1400    Radiological Exams on Admission: CT Head Wo Contrast  Result Date: 05/31/2021 CLINICAL DATA:  Altered mental status EXAM: CT HEAD WITHOUT CONTRAST TECHNIQUE: Contiguous axial images were obtained from the base of the skull through the vertex without intravenous contrast. RADIATION DOSE REDUCTION: This exam was performed according to the departmental dose-optimization program which includes automated exposure control, adjustment of the mA and/or kV according to patient size and/or use of iterative reconstruction technique. COMPARISON:  06/12/2017 FINDINGS: Brain: There is prominent CSF space in the periphery of right frontal and parietal lobes suggesting chronic subdural hygroma measuring up to 10 mm in thickness. This finding has not changed significantly. There is 3.5 x 2.5 cm fluid density structure in the anterior right middle cranial fossa suggesting arachnoid cyst which appears stable. There are no signs of recent bleeding within the cranium. Ventricles are not dilated. There is no significant shift of midline structures. Old lacunar infarct is seen in the right basal ganglia. Cortical sulci are prominent. Vascular: Unremarkable. Skull: Hyperostosis frontalis interna is seen. Sinuses/Orbits: There is mucosal thickening in the ethmoid and sphenoid sinuses. Other: There is increased amount of CSF insula suggesting  partial empty sella. IMPRESSION: No acute intracranial findings are seen. There are no signs of recent bleeding. There is chronic right subdural hygroma measuring up to 10 mm. No significant interval changes are noted in this finding. There is 3.5 x 2.5 cm fluid density  structure in the right middle cranial fossa, possibly arachnoid cyst with no significant interval change. Possible small old lacunar infarct is seen in the right basal ganglia. Chronic sinusitis. Electronically Signed   By: Ernie Avena M.D.   On: 05/31/2021 17:32    EKG: Independently reviewed.  Sinus rhythm at 86 bpm.  Similar to previous.  Assessment/Plan Principal Problem:   TIA (transient ischemic attack) Active Problems:   Normocytic anemia   HYPERTENSION, BENIGN ESSENTIAL   Moderate persistent asthma   History of CVA (cerebrovascular accident)   Chronic diastolic heart failure (HCC)   Dyslipidemia   Spastic hemiplegia affecting nondominant side (HCC)   GERD (gastroesophageal reflux disease)   Seizures (HCC)   TIA History of seizures History of CVA > Patient presenting with transient focal deficit and transient altered mental status with concern for TIA given patient's stroke history. > Patient has known history of stroke with left-sided residual deficits. > History of seizures not currently on any antiepileptics.  Is however reporting episodes where she is unresponsive and eyes rolled back in her head that last 2 to 3 minutes and occurred several times sporadically.  This is led her to stop driving thankfully.  Due to this may benefit from further neurology evaluation regardless of results of MRI and EEG G. > Transient episode included right facial droop, repeatedly saying yes for EMS but also responding with that did not make sense to the grandson but being able to follow commands throughout.  She did return to her baseline while in route and has been at baseline in the ED. > Case was discussed with neurology  in the ED who recommended MRI and EEG and to reconsult if abnormal. - Monitor on telemetry - Continue with home aspirin - Continue with home statin - MRI brain (notify daughter of results when available) - Carotid Doppler - Echocardiogram - A1C  - Lipid panel  - Tele monitoring  - EEG  Hypertension - Continue home amlodipine  Hyperlipidemia - Continue home atorvastatin  Asthma - Replace home Trelegy with formulary Breo and Incruse - Continue home albuterol as needed  Anemia > Hemoglobin stable at 10.7 in ED - Trend CBC  DVT prophylaxis: Lovenox Code Status:   Full Family Communication:  Updated at bedside.,  Family would like results of MRI when they are available Disposition Plan:   Patient is from:  Home  Anticipated DC to:  Home  Anticipated DC date:  1 to 2 days  Anticipated DC barriers: None  Consults called:  Case discussed with neurology by EDP, no formal consult at this time. Admission status:  Observation, telemetry  Severity of Illness: The appropriate patient status for this patient is OBSERVATION. Observation status is judged to be reasonable and necessary in order to provide the required intensity of service to ensure the patient's safety. The patient's presenting symptoms, physical exam findings, and initial radiographic and laboratory data in the context of their medical condition is felt to place them at decreased risk for further clinical deterioration. Furthermore, it is anticipated that the patient will be medically stable for discharge from the hospital within 2 midnights of admission.    Synetta Fail MD [facility]  How to contact the [facility] Attending or Consulting provider 7A - 7P or covering provider during after hours 7P -7A, for this patient?   Check the care team in [facility] and look for a) attending/consulting [facility] provider listed and b) the [facility] team listed Log into [email or web address] and use [facility]'s universal password to access. If you  do not have the password, please contact the hospital operator. Locate the [facility] provider you are looking for under [facility] and page to a number that you can be directly reached. If you still have difficulty reaching the provider, please page the [facility] (Director on Call) for the Hospitalists listed on amion for assistance.  05/31/2021, 8:50 PM

## 2021-05-31 NOTE — ED Triage Notes (Signed)
Pt BIB [facility] from home d/t stroke s/s. Grandson heard her speaking as loud as she could while sitting on the side of the bed bc she all of a sudden "felt weird." She does have Lt side deficit from past stroke (in 2017) & she was worried that she was having another one. ?EMS stated Rt sided facial droop, diaphoretic, & repetitively saying "yes" but ABLE to follow commands upon their arrival. At approx. 1455 EMS reports her speech became clear & she was able to speech in full sentences to them. ?20g PIV in Lt AC, CBG 112, 126/74, 94 bpm NSR, 98% on RA, A/Ox4. ?

## 2021-06-01 ENCOUNTER — Observation Stay (HOSPITAL_BASED_OUTPATIENT_CLINIC_OR_DEPARTMENT_OTHER): Payer: Medicare HMO

## 2021-06-01 ENCOUNTER — Observation Stay (HOSPITAL_COMMUNITY): Payer: Medicare HMO

## 2021-06-01 DIAGNOSIS — G459 Transient cerebral ischemic attack, unspecified: Secondary | ICD-10-CM

## 2021-06-01 DIAGNOSIS — R569 Unspecified convulsions: Secondary | ICD-10-CM | POA: Diagnosis not present

## 2021-06-01 LAB — LIPID PANEL
Cholesterol: 141 mg/dL (ref 0–200)
HDL: 34 mg/dL — ABNORMAL LOW (ref >40)
LDL Cholesterol: 98 mg/dL (ref 0–99)
Total CHOL/HDL Ratio: 4.1 RATIO
Triglycerides: 45 mg/dL (ref <150)
VLDL: 9 mg/dL (ref 0–40)

## 2021-06-01 LAB — ECHOCARDIOGRAM COMPLETE
AR max vel: 3.7 cm2
AV Peak grad: 5.2 mmHg
Ao pk vel: 1.14 m/s
Area-P 1/2: 8.43 cm2
Calc EF: 58.6 %
S' Lateral: 2.6 cm
Single Plane A2C EF: 60.5 %
Single Plane A4C EF: 57.8 %

## 2021-06-01 LAB — HEMOGLOBIN A1C
Hgb A1c MFr Bld: 5.4 % (ref 4.8–5.6)
Mean Plasma Glucose: 108.28 mg/dL

## 2021-06-01 MED ORDER — LEVETIRACETAM 500 MG PO TABS: 500.0000 mg | ORAL_TABLET | Freq: Two times a day (BID) | ORAL | Status: DC

## 2021-06-01 MED ORDER — LEVETIRACETAM 500 MG PO TABS: 500.0000 mg | ORAL_TABLET | Freq: Two times a day (BID) | ORAL | 2 refills | Status: AC

## 2021-06-01 MED ORDER — LEVETIRACETAM 500 MG PO TABS
500.0000 mg | ORAL_TABLET | Freq: Once | ORAL | Status: AC
  Administered 2021-06-01: 500 mg via ORAL
  Filled 2021-06-01: qty 1

## 2021-06-01 NOTE — Procedures (Signed)
Patient Name: [name]  ?MRN: [id number]  ?Epilepsy Attending: Lora Havens  ?Referring Physician/Provider: Modena Slater ?Date: 06/01/2021  ?Duration: 22.17 mins ? ?Patient history: 71 yo F presenting with transient focal deficit and transient altered mental status with concern for TIA given patient's stroke history. EEG to evaluate for seizure ? ?Level of alertness: Awake, asleep ? ?AEDs during EEG study: None ? ?Technical aspects: This EEG study was done with scalp electrodes positioned according to the 10-20 International system of electrode placement. Electrical activity was acquired at a sampling rate of '500Hz'$  and reviewed with a high frequency filter of '70Hz'$  and a low frequency filter of '1Hz'$ . EEG data were recorded continuously and digitally stored.  ? ?Description: The posterior dominant rhythm consists of 8-9 Hz activity of moderate voltage (25-35 uV) seen predominantly in posterior head regions, symmetric and reactive to eye opening and eye closing. Sleep was characterized by vertex waves, sleep spindles (12 to 14 Hz), maximal frontocentral region.  Hyperventilation and photic stimulation were not performed.    ? ?IMPRESSION: ?This study is within normal limits. No seizures or epileptiform discharges were seen throughout the recording. ? ?Lora Havens  ? ?

## 2021-06-01 NOTE — Progress Notes (Signed)
Carotid duplex has been completed.  ? ?Preliminary results in CV Proc.  ? ?[name] [name] ?06/01/2021 1:05 PM    ?

## 2021-06-01 NOTE — ED Notes (Signed)
EEG tech at bedside hooking patient up. Will give scheduled medications when test is complete ?

## 2021-06-01 NOTE — Discharge Instructions (Addendum)
[name], ? ?You were in the hospital with initial concern for possible transient (coming and going) stroke symptoms. It is more likely that you are having seizures. The neurologist has recommended that you start a new medication, Keppra, to reduce the likelihood of continued seizures. Please follow-up with your PCP and Neurologist. You were noted to not be taking your Lipitor. Please restart taking this medication to keep your cholesterol controlled. ? ? ?Per [facility] statutes, patients with seizures are not allowed to drive until  they have been seizure-free for six months. Use caution when using heavy equipment or power tools. Avoid working on ladders or at heights. Take showers instead of baths. Ensure the water temperature is not too high on the home water heater. Do not go swimming alone. When caring for infants or small children, sit down when holding, feeding, or changing them to minimize risk of injury to the child in the event you have a seizure.  ?  ?Also, Maintain good sleep hygiene. Avoid alcohol. ?  ?--> Call 911 and bring the patient back to the ED if: ?  ?            A.  The seizure lasts longer than 5 minutes.      ?            B.  The patient doesn't awaken shortly after the seizure ?            C.  The patient has new problems such as difficulty seeing, speaking or moving ?            D.  The patient was injured during the seizure ?            E.  The patient has a temperature over 102 F (39C) ?            F.  The patient vomited and now is having trouble breathing  ?

## 2021-06-01 NOTE — Discharge Summary (Addendum)
?Physician Discharge Summary ?  ?Patient: [name] MRN: [id number] DOB: [birth year]  ?Admit date:     05/31/2021  ?Discharge date: 06/01/2021  ?Discharge Physician: Cordelia Poche, MD  ? ?PCP: Nolene Ebbs, MD  ? ?Recommendations at discharge:  ? ?Outpatient follow-up with primary care physician and outpatient neurologist ?Consider increasing Lipitor dose if patient's LDL remains above 70 after restarting previously prescribed Lipitor 40 mg daily ?Driving precautions shared on discharge ? ?Discharge Diagnoses: ?Principal Problem: ?  Seizures ([place]) ?Active Problems: ?  Normocytic anemia ?  HYPERTENSION, BENIGN ESSENTIAL ?  Moderate persistent asthma ?  History of CVA (cerebrovascular accident) ?  Chronic diastolic heart failure ([place]) ?  Dyslipidemia ?  Spastic hemiplegia affecting nondominant side ([place]) ?  GERD (gastroesophageal reflux disease) ? ?Resolved Problems: ?  * No resolved hospital problems. * ? ?Hospital Course: ? ?[name] is a 71 y.o. female that presented secondary to transient neurological deficit.  Symptoms included altered sensorium with some unresponsiveness and possible facial droop and diaphoresis.  Patient was also found to repeatedly respond to questions with " yes."  Patient was also noticed to have some stiffening of her body and possible eye rolling.  Initial concern for TIA on admission with negative MRI.  Neurology was consulted and was concern for seizure activity.  Patient was previously diagnosed with possible seizure but never started on antiepileptic drugs.  Neurology recommending Keppra 500 mg twice daily.  Per neurology, unlikely TIA and that has been ruled out.  Echocardiogram without evidence of thrombus or ASD in addition to normal LVEF of 55 to 60% with mild diastolic dysfunction (grade 1 diastolic).  Carotid artery duplex without evidence of significant carotid artery stenosis.  EEG was obtained and was negative for seizure activity or epileptiform discharges.  Patient's LDL  of 98 is above goal, however patient is not adherent with Lipitor at this time.  Recommended to resume Lipitor at 40 mg dose and if continues to have LDL greater than 70, would recommend increase to Lipitor 80 mg daily.  Patient to follow-up with her primary care physician and also her neurologist as an outpatient. ? ?Consultants: Neurology ?Procedures performed: Echocardiogram, carotid artery duplex, EEG ?Disposition: Home ?Diet recommendation:  ?Cardiac diet ?DISCHARGE MEDICATION: ?Allergies as of 06/01/2021   ?No Known Allergies ?  ? ?  ?Medication List  ?  ? ?STOP taking these medications   ? ?meclizine 25 MG tablet ?Commonly known as: ANTIVERT ?  ? ?  ? ?TAKE these medications   ? ?albuterol 1.25 MG/3ML nebulizer solution ?Commonly known as: ACCUNEB ?Take 1 ampule by nebulization in the morning and at bedtime. ?  ?amLODipine 5 MG tablet ?Commonly known as: NORVASC ?Take 5 mg by mouth daily. ?  ?aspirin EC 81 MG tablet ?Take 81 mg by mouth daily. ?  ?atorvastatin 40 MG tablet ?Commonly known as: LIPITOR ?Take 1 tablet (40 mg total) by mouth daily at 6 PM. ?  ?celecoxib 100 MG capsule ?Commonly known as: CeleBREX ?Take 1 capsule (100 mg total) by mouth 2 (two) times daily. ?  ?Flutter Devi ?Use as directed ?  ?folic acid 1 MG tablet ?Commonly known as: FOLVITE ?Take 1 mg by mouth daily. ?  ?levETIRAcetam 500 MG tablet ?Commonly known as: KEPPRA ?Take 1 tablet (500 mg total) by mouth 2 (two) times daily. ?  ?methocarbamol 500 MG tablet ?Commonly known as: ROBAXIN ?Take 1 tablet (500 mg total) by mouth every 6 (six) hours as needed for muscle spasms. ?  ?  methotrexate 2.5 MG tablet ?Commonly known as: RHEUMATREX ?Take 10 mg by mouth once a week. ?  ?oxyCODONE 5 MG immediate release tablet ?Commonly known as: Oxy IR/ROXICODONE ?Take 1-2 tablets (5-10 mg total) by mouth every 4 (four) hours as needed for moderate pain (pain score 4-6). ?  ?potassium chloride 10 MEQ tablet ?Commonly known as: KLOR-CON ?Take 10 mEq by  mouth every evening. ?  ?PRESCRIPTION MEDICATION ?Take 1 tablet by mouth daily. New Blood Pressure Med ?  ?Trelegy Ellipta 200-62.5-25 MCG/ACT Aepb ?Generic drug: Fluticasone-Umeclidin-Vilant ?Inhale 1 puff into the lungs 3 (three) times daily as needed (cough, shortness of breath). ?  ?Vitamin D (Ergocalciferol) 1.25 MG (50000 UNIT) Caps capsule ?Commonly known as: DRISDOL ?Take 50,000 Units by mouth every Sunday. ?  ? ?  ? ? Follow-up Information   ? ? Nolene Ebbs, MD Follow up in 1 week(s).   ?Specialty: Internal Medicine ?Why: For hospital follow-up ?Contact information: ?[address] ?[place] [address] ?[phone] ? ? ?  ?  ? ? Cameron Sprang, MD Follow up.   ?Specialty: Neurology ?Why: For hospital follow-up for seizures ?Contact information: ?[address] ?[address] ?[place] [address] ?[phone] ? ? ?  ?  ? ?  ?  ? ?  ? ?Discharge Exam: ? ?BP 140/85   Pulse (!) 108   Temp 98 ?F (36.7 ?C) (Oral)   Resp 16   LMP  (LMP Unknown)   SpO2 98%  ? ?General exam: Appears calm and comfortable ?Respiratory system: Clear to auscultation. Respiratory effort normal. ?Cardiovascular system: S1 & S2 heard, RRR. No murmurs, rubs, gallops or clicks. ?Gastrointestinal system: Abdomen is nondistended, soft and nontender. Normal bowel sounds heard. ?Central nervous system: Alert and oriented. Left sided hemiparesis ?Musculoskeletal: No calf tenderness ? ? ?Condition at discharge: stable ? ?The results of significant diagnostics from this hospitalization (including imaging, microbiology, ancillary and laboratory) are listed below for reference.  ? ?Imaging Studies: ?CT Head Wo Contrast ? ?Result Date: 05/31/2021 ?CLINICAL DATA:  Altered mental status EXAM: CT HEAD WITHOUT CONTRAST TECHNIQUE: Contiguous axial images were obtained from the base of the skull through the vertex without intravenous contrast. RADIATION DOSE REDUCTION: This exam was performed according to the departmental dose-optimization  program which includes automated exposure control, adjustment of the mA and/or kV according to patient size and/or use of iterative reconstruction technique. COMPARISON:  06/12/2017 FINDINGS: Brain: There is prominent CSF space in the periphery of right frontal and parietal lobes suggesting chronic subdural hygroma measuring up to 10 mm in thickness. This finding has not changed significantly. There is 3.5 x 2.5 cm fluid density structure in the anterior right middle cranial fossa suggesting arachnoid cyst which appears stable. There are no signs of recent bleeding within the cranium. Ventricles are not dilated. There is no significant shift of midline structures. Old lacunar infarct is seen in the right basal ganglia. Cortical sulci are prominent. Vascular: Unremarkable. Skull: Hyperostosis frontalis interna is seen. Sinuses/Orbits: There is mucosal thickening in the ethmoid and sphenoid sinuses. Other: There is increased amount of CSF insula suggesting partial empty sella. IMPRESSION: No acute intracranial findings are seen. There are no signs of recent bleeding. There is chronic right subdural hygroma measuring up to 10 mm. No significant interval changes are noted in this finding. There is 3.5 x 2.5 cm fluid density structure in the right middle cranial fossa, possibly arachnoid cyst with no significant interval change. Possible small old lacunar infarct is seen in the right  basal ganglia. Chronic sinusitis. Electronically Signed   By: Elmer Picker M.D.   On: 05/31/2021 17:32  ? ?MR BRAIN WO CONTRAST ? ?Result Date: 05/31/2021 ?CLINICAL DATA:  TIA EXAM: MRI HEAD WITHOUT CONTRAST TECHNIQUE: Multiplanar, multiecho pulse sequences of the brain and surrounding structures were obtained without intravenous contrast. COMPARISON:  MRI head 06/12/2017, correlation is also made with CT head 05/31/2021 FINDINGS: Brain: No restricted diffusion to suggest acute or subacute infarct. No acute hemorrhage, mass, mass  effect, or midline shift. Redemonstrated subdural collection over the right cerebral convexity, which measures up to 8 mm, previously 9 mm. This collection follows CSF on all sequences, most likely a subdural hygrom

## 2021-06-01 NOTE — Consult Note (Signed)
Neurology Consultation ?Reason for Consult: Transiuent speehc difficulty ?Referring Physician: Teryl Lucy, R ? ?CC: Speech difficulty ? ?History is obtained from:patient, chart ? ?HPI: [name] is a 71 y.o. female with a history of hypertension, hyperlipidemia who presents with transient episode of perseverative speech, confusion.  Patient is completely amnestic to the episode.  Of note, she has intermittent spells lasting 2 to 3 minutes where she stiffens up, her eyes rolled back in her head, and she is unresponsive to voice for a few minutes.  Following this she improves back to her baseline over another few minutes.  She occasionally is amnestic to these episodes, occasionally remembers that she had something.  With the episode today, she has no recollection of certain events such as EMS arriving, or ever having difficulty with her speech.  Family describes that she was just saying "yes" over and over again while not responding to what other people were saying to her.  She has since returned to baseline.  MRI was performed and is negative. ? ? ? ? ?ROS: A 14 point ROS was performed and is negative except as noted in the HPI.  ?Past Medical History:  ?Diagnosis Date  ? Arthritis   ? Asthma   ? Difficulty sleeping   ? Gallstones   ? History of kidney stones   ? Hyperlipidemia   ? Hypertension   ? Pneumonia   ? Preoperative clearance   ? Spasmodic cough 06/2017  ? "IT COMES IN DIFFERENT SEASONS"  ? Stroke [facility]) 2015  ? WEAKNESS L SIDE OF BODY   ? ? ? ?Family History  ?Problem Relation Age of Onset  ? Diabetes Mother   ? Colon cancer Father 41  ? Colon polyps Neg Hx   ? Esophageal cancer Neg Hx   ? Rectal cancer Neg Hx   ? Stomach cancer Neg Hx   ? ? ? ?Social History:  reports that she quit smoking about 27 years ago. Her smoking use included cigarettes. She has never used smokeless tobacco. She reports that she does not drink alcohol and does not use drugs. ? ? ?Exam: ?Current vital signs: ?BP (!) 130/93   Pulse 97    Temp 98.9 ?F (37.2 ?C)   Resp (!) 25   LMP  (LMP Unknown)   SpO2 99%  ?Vital signs in last 24 hours: ?Temp:  [98.9 ?F (37.2 ?C)] 98.9 ?F (37.2 ?C) (04/25 2000) ?Pulse Rate:  [84-99] 97 (04/26 0830) ?Resp:  [10-25] 25 (04/26 0830) ?BP: (119-164)/(71-116) 130/93 (04/26 0830) ?SpO2:  [91 %-100 %] 99 % (04/26 0830) ? ? ?Physical Exam  ?Constitutional: Appears well-developed and well-nourished.  ? ?Neuro: ?Mental Status: ?Patient is awake, alert, oriented to person, place, month, year, and situation. ?Patient is able to give a clear and coherent history. ?No signs of aphasia or neglect ?Cranial Nerves: ?II: Visual Fields are full. Pupils are equal, round, and reactive to light.   ?III,IV, VI: EOMI without ptosis or diploplia.  ?V: Facial sensation is symmetric to temperature ?VII: Facial movement is symmetric.  ?VIII: hearing is intact to voice ?X: Uvula elevates symmetrically ?XI: Shoulder shrug is symmetric. ?XII: tongue is midline without atrophy or fasciculations.  ?Motor: ?Marland Kitchen  She has significant limitations to confrontation due to pain in her hip and her arms.  To confrontation, she has good distal strength in her right lower extremity, unable to make a fist with her right hand, which she states is due to pain in her hand.  She is  able to hold her right arm without drift.  She has left arm chronic paresis from previous stroke. ?Sensory: ?Sensation is symmetric to light touch and temperature in the arms and legs. ?Cerebellar: ?No definite ataxia ? ? ?I have reviewed labs in epic and the results pertinent to this consultation are: ?Creatinine 0.58 ? ?I have reviewed the images obtained: MRI brain-negative ? ?Impression: 71 year old female with transient perseverative speech as well as recurrent episodes of unresponsiveness in the setting of a previous known seizure.  The description of the spells of unresponsiveness with stiffening, and complete unresponsiveness with eyes opening, are quite concerning for  partial seizures.  I would favor starting antiepileptic therapy at this time.  The episode that she presents for today, also with her being completely amnestic to the episode which strongly suggest an ictal nature as opposed to transient ischemic attack.  With negative MRI, I would favor treating this as seizure. ? ?Recommendations: ?1) Keppra 500 mg twice daily ?2) continue antiplatelet therapy with aspirin ?3) continue secondary risk factor modification including keeping LDL less than 70, so she will need an increase statin dose based on her previous stroke ?4) neurology will be available as needed. ? ? ?Roland Rack, MD ?[facility] ?[phone] ? ?If 7pm- 7am, please page neurology on call as listed in [place]. ? ?

## 2021-06-01 NOTE — ED Notes (Signed)
Patient calm and cooperative, wondering when she will get a floor bed.  ?

## 2021-06-01 NOTE — ED Notes (Signed)
Pt DC'd from ED.  DC instructions reviewed with pt.  Understanding verbalized.  PT DC.  ?

## 2021-06-01 NOTE — Progress Notes (Signed)
EEG complete - results pending 

## 2021-06-24 ENCOUNTER — Other Ambulatory Visit: Payer: Self-pay | Admitting: Internal Medicine

## 2021-06-25 LAB — LIPID PANEL
Cholesterol: 165 mg/dL (ref <200)
HDL: 44 mg/dL — ABNORMAL LOW (ref >=50)
LDL Cholesterol (Calc): 107 mg/dL (calc) — ABNORMAL HIGH
Non-HDL Cholesterol (Calc): 121 mg/dL (calc) (ref <130)
Total CHOL/HDL Ratio: 3.8 (calc) (ref <5.0)
Triglycerides: 55 mg/dL (ref <150)

## 2021-06-25 LAB — CBC
HCT: 33.8 % — ABNORMAL LOW (ref 35.0–45.0)
Hemoglobin: 10.8 g/dL — ABNORMAL LOW (ref 11.7–15.5)
MCH: 27 pg (ref 27.0–33.0)
MCHC: 32 g/dL (ref 32.0–36.0)
MCV: 84.5 fL (ref 80.0–100.0)
MPV: 10.7 fL (ref 7.5–12.5)
Platelets: 280 10*3/uL (ref 140–400)
RBC: 4 10*6/uL (ref 3.80–5.10)
RDW: 15.1 % — ABNORMAL HIGH (ref 11.0–15.0)
WBC: 3.7 10*3/uL — ABNORMAL LOW (ref 3.8–10.8)

## 2021-06-25 LAB — COMPLETE METABOLIC PANEL WITH GFR
AG Ratio: 1 (calc) (ref 1.0–2.5)
ALT: 18 U/L (ref 6–29)
AST: 12 U/L (ref 10–35)
Albumin: 3.6 g/dL (ref 3.6–5.1)
Alkaline phosphatase (APISO): 75 U/L (ref 37–153)
BUN/Creatinine Ratio: 13 (calc) (ref 6–22)
BUN: 7 mg/dL (ref 7–25)
CO2: 25 mmol/L (ref 20–32)
Calcium: 9.1 mg/dL (ref 8.6–10.4)
Chloride: 105 mmol/L (ref 98–110)
Creat: 0.55 mg/dL — ABNORMAL LOW (ref 0.60–1.00)
Globulin: 3.6 g/dL (calc) (ref 1.9–3.7)
Glucose, Bld: 111 mg/dL — ABNORMAL HIGH (ref 65–99)
Potassium: 3.8 mmol/L (ref 3.5–5.3)
Sodium: 142 mmol/L (ref 135–146)
Total Bilirubin: 0.4 mg/dL (ref 0.2–1.2)
Total Protein: 7.2 g/dL (ref 6.1–8.1)
eGFR: 98 mL/min/{1.73_m2} (ref >=60)

## 2021-06-25 LAB — TSH: TSH: 1.01 mIU/L (ref 0.40–4.50)

## 2021-08-02 ENCOUNTER — Other Ambulatory Visit: Payer: Self-pay | Admitting: Surgical

## 2021-12-15 ENCOUNTER — Other Ambulatory Visit: Payer: Self-pay | Admitting: Surgical

## 2022-01-03 DIAGNOSIS — M179 Osteoarthritis of knee, unspecified: Secondary | ICD-10-CM | POA: Diagnosis not present

## 2022-01-03 DIAGNOSIS — M069 Rheumatoid arthritis, unspecified: Secondary | ICD-10-CM | POA: Diagnosis not present

## 2022-01-03 DIAGNOSIS — J4520 Mild intermittent asthma, uncomplicated: Secondary | ICD-10-CM | POA: Diagnosis not present

## 2022-01-03 DIAGNOSIS — I10 Essential (primary) hypertension: Secondary | ICD-10-CM | POA: Diagnosis not present

## 2022-01-12 DIAGNOSIS — H2513 Age-related nuclear cataract, bilateral: Secondary | ICD-10-CM | POA: Diagnosis not present

## 2022-01-12 DIAGNOSIS — H52223 Regular astigmatism, bilateral: Secondary | ICD-10-CM | POA: Diagnosis not present

## 2022-01-12 DIAGNOSIS — H524 Presbyopia: Secondary | ICD-10-CM | POA: Diagnosis not present

## 2022-01-12 DIAGNOSIS — H5203 Hypermetropia, bilateral: Secondary | ICD-10-CM | POA: Diagnosis not present

## 2022-03-14 ENCOUNTER — Other Ambulatory Visit: Payer: Self-pay | Admitting: Family Medicine

## 2022-03-14 DIAGNOSIS — Z1231 Encounter for screening mammogram for malignant neoplasm of breast: Secondary | ICD-10-CM

## 2022-03-15 ENCOUNTER — Other Ambulatory Visit: Payer: Self-pay | Admitting: Family Medicine

## 2022-03-15 ENCOUNTER — Ambulatory Visit
Admission: RE | Admit: 2022-03-15 | Discharge: 2022-03-15 | Disposition: A | Discharge status: Home / Self Care | Payer: Medicare HMO | Source: Ambulatory Visit | Attending: Family Medicine | Admitting: Family Medicine

## 2022-03-15 DIAGNOSIS — M069 Rheumatoid arthritis, unspecified: Secondary | ICD-10-CM

## 2022-03-15 DIAGNOSIS — M79641 Pain in right hand: Secondary | ICD-10-CM

## 2022-03-30 ENCOUNTER — Ambulatory Visit
Admission: RE | Admit: 2022-03-30 | Discharge: 2022-03-30 | Disposition: A | Discharge status: Home / Self Care | Payer: Medicare HMO | Source: Ambulatory Visit | Attending: Family Medicine | Admitting: Family Medicine

## 2022-03-30 DIAGNOSIS — Z1231 Encounter for screening mammogram for malignant neoplasm of breast: Secondary | ICD-10-CM

## 2022-05-16 ENCOUNTER — Other Ambulatory Visit: Payer: Self-pay | Admitting: Family Medicine

## 2022-05-16 DIAGNOSIS — Z780 Asymptomatic menopausal state: Secondary | ICD-10-CM

## 2022-07-13 ENCOUNTER — Other Ambulatory Visit: Payer: Self-pay | Admitting: Family Medicine

## 2022-07-13 ENCOUNTER — Ambulatory Visit
Admission: RE | Admit: 2022-07-13 | Discharge: 2022-07-13 | Disposition: A | Discharge status: Home / Self Care | Payer: Medicare HMO | Source: Ambulatory Visit | Attending: Family Medicine | Admitting: Family Medicine

## 2022-07-13 DIAGNOSIS — M069 Rheumatoid arthritis, unspecified: Secondary | ICD-10-CM

## 2022-11-21 ENCOUNTER — Other Ambulatory Visit: Payer: Medicare HMO

## 2022-12-06 ENCOUNTER — Other Ambulatory Visit: Payer: Self-pay | Admitting: Internal Medicine

## 2022-12-06 DIAGNOSIS — Z780 Asymptomatic menopausal state: Secondary | ICD-10-CM

## 2023-04-12 ENCOUNTER — Ambulatory Visit: Payer: Medicare HMO | Admitting: Orthopedic Surgery

## 2023-04-26 ENCOUNTER — Ambulatory Visit
Admission: RE | Admit: 2023-04-26 | Discharge: 2023-04-26 | Disposition: A | Discharge status: Home / Self Care | Payer: Medicare (Managed Care) | Source: Ambulatory Visit | Attending: Family Medicine | Admitting: Family Medicine

## 2023-04-26 ENCOUNTER — Other Ambulatory Visit: Payer: Self-pay | Admitting: Family Medicine

## 2023-04-26 DIAGNOSIS — R059 Cough, unspecified: Secondary | ICD-10-CM

## 2023-04-30 ENCOUNTER — Ambulatory Visit: Admitting: Orthopedic Surgery

## 2023-05-09 ENCOUNTER — Other Ambulatory Visit (INDEPENDENT_AMBULATORY_CARE_PROVIDER_SITE_OTHER): Payer: Self-pay

## 2023-05-09 ENCOUNTER — Other Ambulatory Visit: Payer: Self-pay

## 2023-05-09 ENCOUNTER — Ambulatory Visit: Payer: Medicare (Managed Care) | Admitting: Orthopedic Surgery

## 2023-05-09 ENCOUNTER — Other Ambulatory Visit (INDEPENDENT_AMBULATORY_CARE_PROVIDER_SITE_OTHER): Payer: Medicare (Managed Care)

## 2023-05-09 DIAGNOSIS — M25561 Pain in right knee: Secondary | ICD-10-CM | POA: Diagnosis not present

## 2023-05-09 DIAGNOSIS — M25562 Pain in left knee: Secondary | ICD-10-CM | POA: Diagnosis not present

## 2023-05-09 DIAGNOSIS — M25551 Pain in right hip: Secondary | ICD-10-CM

## 2023-05-09 DIAGNOSIS — M1611 Unilateral primary osteoarthritis, right hip: Secondary | ICD-10-CM | POA: Diagnosis not present

## 2023-05-12 ENCOUNTER — Encounter: Payer: Self-pay | Admitting: Orthopedic Surgery

## 2023-05-12 MED ORDER — BUPIVACAINE HCL 0.25 % IJ SOLN
4.0000 mL | INTRAMUSCULAR | Status: AC | PRN
Start: 2023-05-09 — End: 2023-05-09
  Administered 2023-05-09: 4 mL via INTRA_ARTICULAR

## 2023-05-12 MED ORDER — METHYLPREDNISOLONE ACETATE 80 MG/ML IJ SUSP
80.0000 mg | INTRAMUSCULAR | Status: AC | PRN
Start: 2023-05-09 — End: 2023-05-09
  Administered 2023-05-09: 80 mg via INTRA_ARTICULAR

## 2023-05-12 MED ORDER — LIDOCAINE HCL 1 % IJ SOLN
5.0000 mL | INTRAMUSCULAR | Status: AC | PRN
Start: 2023-05-09 — End: 2023-05-09
  Administered 2023-05-09: 5 mL

## 2023-05-12 NOTE — Progress Notes (Signed)
 Office Visit Note   Patient: [name]           Date of Birth: 1950-03-06           MRN: [id number] Visit Date: 05/09/2023 Requested by: Fleet Contras, MD [address] [place],  [place] [address] PCP: Fleet Contras, MD  Subjective: Chief Complaint  Patient presents with   Left Knee - Pain   Right Knee - Pain    HPI: [name] is a 73 y.o. female who presents to the office reporting bilateral knee pain and right hip pain.  She has had 2 falls recently but she was able to walk the next day.  Last fall was many years the day.  She describes pain in the right groin and right knee.  Hurts for her to try to put her right leg over her left leg when she turns over in bed.  Has a known history of bilateral knee replacements.  Describes groin pain..                ROS: All systems reviewed are negative as they relate to the chief complaint within the history of present illness.  Patient denies fevers or chills.  Assessment & Plan: Visit Diagnoses:  1. Pain in both knees, unspecified chronicity   2. Pain in right hip     Plan: Impression is end-stage arthritis of the right hip.  Patient is not really too keen on hip replacement.  Plan at this time is ultrasound-guided right hip injection.  Will see how she does with that.  If she needs knee replacement I would likely have Dr. Magnus Ivan or Dr. Roda Shutters evaluate her because of her preoperative bony deformity.  Follow-Up Instructions: No follow-ups on file.   Orders:  Orders Placed This Encounter  Procedures   XR KNEE 3 VIEW RIGHT   XR HIP UNILAT W OR W/O PELVIS 2-3 VIEWS RIGHT   US Guided Needle Placement - No Linked Charges   No orders of the defined types were placed in this encounter.     Procedures: Large Joint Inj: R hip joint on 05/09/2023 3:26 PM Indications: pain and diagnostic evaluation Details: 22 G 3.5 in needle, ultrasound-guided lateral approach  Arthrogram: No  Medications: 5 mL lidocaine 1 %; 80 mg  methylPREDNISolone acetate 80 MG/ML; 4 mL bupivacaine 0.25 % Outcome: tolerated well, no immediate complications Procedure, treatment alternatives, risks and benefits explained, specific risks discussed. Consent was given by the patient. Immediately prior to procedure a time out was called to verify the correct patient, procedure, equipment, support staff and site/side marked as required. Patient was prepped and draped in the usual sterile fashion.       Clinical Data: No additional findings.  Objective: Vital Signs: LMP  (LMP Unknown)   Physical Exam:  Constitutional: Patient appears well-developed HEENT:  Head: Normocephalic Eyes:EOM are normal Neck: Normal range of motion Cardiovascular: Normal rate Pulmonary/chest: Effort normal Neurologic: Patient is alert Skin: Skin is warm Psychiatric: Patient has normal mood and affect  Ortho Exam: Ortho exam demonstrates good range of motion in both knees.  No effusion or warmth.  Extensor mechanism intact.  Has limited and painful range of motion of the right hip particular with internal rotation.  Left hip range of motion nontender nonpainful.  No trochanteric tenderness is present.  No nerve root tension signs.  Specialty Comments:  No specialty comments available.  Imaging: No results found.   PMFS History: Patient Active Problem List  Diagnosis Date Noted   Arthritis of right knee    DJD (degenerative joint disease) of knee 01/05/2021   S/P total knee replacement, right 01/04/2021   Arthritis of left knee 07/17/2019   Asthma 06/12/2017   Hyperlipidemia 06/12/2017   Seizures ([facility]) 06/12/2017   Seizure ([facility]) 06/12/2017   GERD (gastroesophageal reflux disease) 10/31/2016   Chronic rhinitis 10/31/2016   Chronic cholecystitis 04/12/2015   Preoperative clearance 03/17/2015   Spastic hemiplegia affecting nondominant side ([facility]) 10/27/2013   Adhesive capsulitis of right shoulder 10/27/2013   Chronic diastolic heart failure  ([facility]) 16/11/9602   Dyslipidemia 08/26/2013   Borderline diabetes 08/26/2013   Cerebral infarction ([facility]) 08/26/2013   History of CVA (cerebrovascular accident) 08/23/2013   Pre-syncope 08/17/2012   Hypokalemia 08/17/2012   Arthropathy 03/28/2010   DE QUERVAIN'S TENOSYNOVITIS 03/28/2010   Normocytic anemia 07/26/2009   Moderate persistent asthma 03/09/2009   DENTAL CARIES 03/09/2009   CANDIDIASIS OF VULVA AND VAGINA 06/04/2008   SKIN TAG 06/04/2008   ARTHRITIS, KNEES, BILATERAL 05/22/2008   HYPERTENSION, BENIGN ESSENTIAL 05/04/2008   KNEE PAIN, BILATERAL 05/04/2008   Cough, persistent 05/04/2008   MENISCUS TEAR, RIGHT 12/07/2005   Past Medical History:  Diagnosis Date   Arthritis    Asthma    Difficulty sleeping    Gallstones    History of kidney stones    Hyperlipidemia    Hypertension    Pneumonia    Preoperative clearance    Spasmodic cough 06/2017   "IT COMES IN DIFFERENT SEASONS"   Stroke ([facility]) 2015   WEAKNESS L SIDE OF BODY     Family History  Problem Relation Age of Onset   Diabetes Mother    Colon cancer Father 45   Colon polyps Neg Hx    Esophageal cancer Neg Hx    Rectal cancer Neg Hx    Stomach cancer Neg Hx    Breast cancer Neg Hx     Past Surgical History:  Procedure Laterality Date   CHOLECYSTECTOMY N/A 04/12/2015   Procedure: LAPAROSCOPIC CHOLECYSTECTOMY WITH INTRAOPERATIVE CHOLANGIOGRAM;  Surgeon: Gaynelle Adu, MD;  Location: [facility] ORS;  Service: General;  Laterality: N/A;   gall stones     KNEE ARTHROSCOPY  2010   TOTAL KNEE ARTHROPLASTY Left 07/17/2019   Procedure: LEFT TOTAL KNEE ARTHROPLASTY;  Surgeon: Cammy Copa, MD;  Location: [facility] OR;  Service: Orthopedics;  Laterality: Left;   TOTAL KNEE ARTHROPLASTY Right 01/04/2021   Procedure: RIGHT TOTAL KNEE ARTHROPLASTY;  Surgeon: Cammy Copa, MD;  Location[facility];  Service: Orthopedics;  Laterality: Right;   TUBAL LIGATION     Social History   Occupational History   Not on file   Tobacco Use   Smoking status: Former    Current packs/day: 0.00    Types: Cigarettes    Quit date: 04/08/1994    Years since quitting: 29.1   Smokeless tobacco: Never  Vaping Use   Vaping status: Never Used  Substance and Sexual Activity   Alcohol use: No   Drug use: No   Sexual activity: Not on file

## 2023-07-17 ENCOUNTER — Other Ambulatory Visit: Payer: Medicare HMO

## 2023-07-28 ENCOUNTER — Other Ambulatory Visit: Payer: Self-pay

## 2023-07-28 ENCOUNTER — Emergency Department (HOSPITAL_COMMUNITY)
Admission: EM | Admit: 2023-07-28 | Discharge: 2023-07-28 | Disposition: A | Discharge status: Home / Self Care | Attending: Emergency Medicine | Admitting: Emergency Medicine

## 2023-07-28 ENCOUNTER — Encounter (HOSPITAL_COMMUNITY): Payer: Self-pay

## 2023-07-28 DIAGNOSIS — R42 Dizziness and giddiness: Secondary | ICD-10-CM | POA: Insufficient documentation

## 2023-07-28 DIAGNOSIS — Z7982 Long term (current) use of aspirin: Secondary | ICD-10-CM | POA: Insufficient documentation

## 2023-07-28 DIAGNOSIS — Z79899 Other long term (current) drug therapy: Secondary | ICD-10-CM | POA: Diagnosis not present

## 2023-07-28 DIAGNOSIS — R55 Syncope and collapse: Secondary | ICD-10-CM | POA: Insufficient documentation

## 2023-07-28 DIAGNOSIS — I10 Essential (primary) hypertension: Secondary | ICD-10-CM | POA: Insufficient documentation

## 2023-07-28 LAB — CBC
HCT: 32.2 % — ABNORMAL LOW (ref 36.0–46.0)
Hemoglobin: 9.8 g/dL — ABNORMAL LOW (ref 12.0–15.0)
MCH: 27.3 pg (ref 26.0–34.0)
MCHC: 30.4 g/dL (ref 30.0–36.0)
MCV: 89.7 fL (ref 80.0–100.0)
Platelets: 226 10*3/uL (ref 150–400)
RBC: 3.59 MIL/uL — ABNORMAL LOW (ref 3.87–5.11)
RDW: 14.6 % (ref 11.5–15.5)
WBC: 4.5 10*3/uL (ref 4.0–10.5)
nRBC: 0 % (ref 0.0–0.2)

## 2023-07-28 LAB — URINALYSIS, ROUTINE W REFLEX MICROSCOPIC
Bilirubin Urine: NEGATIVE
Glucose, UA: NEGATIVE mg/dL
Hgb urine dipstick: NEGATIVE
Ketones, ur: NEGATIVE mg/dL
Nitrite: NEGATIVE
Protein, ur: NEGATIVE mg/dL
Specific Gravity, Urine: 1.003 — ABNORMAL LOW (ref 1.005–1.030)
pH: 6 (ref 5.0–8.0)

## 2023-07-28 LAB — CBG MONITORING, ED: Glucose-Capillary: 88 mg/dL (ref 70–99)

## 2023-07-28 LAB — COMPREHENSIVE METABOLIC PANEL WITH GFR
ALT: 21 U/L (ref 0–44)
AST: 14 U/L — ABNORMAL LOW (ref 15–41)
Albumin: 3.2 g/dL — ABNORMAL LOW (ref 3.5–5.0)
Alkaline Phosphatase: 67 U/L (ref 38–126)
Anion gap: 8 (ref 5–15)
BUN: 11 mg/dL (ref 8–23)
CO2: 25 mmol/L (ref 22–32)
Calcium: 8.6 mg/dL — ABNORMAL LOW (ref 8.9–10.3)
Chloride: 106 mmol/L (ref 98–111)
Creatinine, Ser: 0.65 mg/dL (ref 0.44–1.00)
GFR, Estimated: >60 mL/min (ref >60)
Glucose, Bld: 92 mg/dL (ref 70–99)
Potassium: 3.4 mmol/L — ABNORMAL LOW (ref 3.5–5.1)
Sodium: 139 mmol/L (ref 135–145)
Total Bilirubin: 0.5 mg/dL (ref 0.0–1.2)
Total Protein: 7.2 g/dL (ref 6.5–8.1)

## 2023-07-28 LAB — TROPONIN I (HIGH SENSITIVITY): Troponin I (High Sensitivity): 5 ng/L (ref <18)

## 2023-07-28 MED ORDER — SODIUM CHLORIDE 0.9 % IV BOLUS
500.0000 mL | Freq: Once | INTRAVENOUS | Status: AC
  Administered 2023-07-28: 500 mL via INTRAVENOUS

## 2023-07-28 NOTE — ED Triage Notes (Signed)
 Pt arrives via EMS from home. Pt was at a seated position and a syncopal episode. Pt reports the only thing she has eaten today was a cracker. Pt arrives AxOx4. Pt denies any complaints at this time. EMS administered 500mL of ns prior to arrival due to orthostatic hypotension upon their arrival.

## 2023-07-28 NOTE — ED Provider Notes (Signed)
 [facility] AT [facility] Provider Note   CSN: [id number] Arrival date & time: 07/28/23  1850     Patient presents with: Loss of Consciousness   [name] is a 73 y.o. female.  She is brought in by EMS after a lightheaded near syncopal event while at an outside cookout.  She said she was in a tent not under the sun and did not feel especially hot.  She was sitting for a long time and then began feeling lightheaded.  She had not eaten or drank much today.  Someone alerted her daughter who called 911 and she was brought here.  She feels back to baseline.  She said she gets lightheaded sometimes and when she is at home she would just go to the bedroom and lay down for a bit.  She ate and drank well yesterday.  No chest pain shortness of breath numbness weakness headache.  No recent medication changes.  {Add pertinent medical, surgical, social history, OB history to YEP:67052} The history is provided by the patient.  Dizziness Quality:  Lightheadedness Severity:  Moderate Onset quality:  Gradual Progression:  Resolved Chronicity:  Recurrent Relieved by:  None tried Associated symptoms: no chest pain, no headaches, no nausea, no palpitations, no shortness of breath, no syncope, no vision changes and no vomiting        Prior to Admission medications   Medication Sig Start Date End Date Taking? Authorizing Provider  albuterol  (ACCUNEB ) 1.25 MG/3ML nebulizer solution Take 1 ampule by nebulization in the morning and at bedtime.    [provider]  amLODipine  (NORVASC ) 5 MG tablet Take 5 mg by mouth daily. 12/14/20   [provider]  aspirin  EC 81 MG tablet Take 81 mg by mouth daily.    [provider]  atorvastatin  (LIPITOR) 40 MG tablet Take 1 tablet (40 mg total) by mouth daily at 6 PM. Patient not taking: Reported on 05/31/2021 09/18/13   Angiulli, Toribio PARAS, PA-C  celecoxib  (CELEBREX ) 100 MG capsule TAKE 1 CAPSULE(100 MG) BY MOUTH  TWICE DAILY 12/15/21   Magnant, Carlin CROME, PA-C  Fluticasone -Umeclidin-Vilant (TRELEGY ELLIPTA) 200-62.5-25 MCG/INH AEPB Inhale 1 puff into the lungs 3 (three) times daily as needed (cough, shortness of breath).    [provider]  folic acid (FOLVITE) 1 MG tablet Take 1 mg by mouth daily.    [provider]  levETIRAcetam  (KEPPRA ) 500 MG tablet Take 1 tablet (500 mg total) by mouth 2 (two) times daily. 06/01/21 08/30/21  Briana Elgin LABOR, MD  methocarbamol  (ROBAXIN ) 500 MG tablet Take 1 tablet (500 mg total) by mouth every 6 (six) hours as needed for muscle spasms. 01/06/21   Addie Cordella Hamilton, MD  methotrexate (RHEUMATREX) 2.5 MG tablet Take 10 mg by mouth once a week. 05/30/21   [provider]  oxyCODONE  (OXY IR/ROXICODONE ) 5 MG immediate release tablet Take 1-2 tablets (5-10 mg total) by mouth every 4 (four) hours as needed for moderate pain (pain score 4-6). 01/06/21   Magnant, Charles L, PA-C  potassium chloride  (KLOR-CON ) 10 MEQ tablet Take 10 mEq by mouth every evening.    [provider]  PRESCRIPTION MEDICATION Take 1 tablet by mouth daily. New Blood Pressure Med    [provider]  Respiratory Therapy Supplies (FLUTTER) DEVI Use as directed 10/31/16   Bobbitt, Elgin Pepper, MD  Vitamin D, Ergocalciferol, (DRISDOL) 1.25 MG (50000 UNIT) CAPS capsule Take 50,000 Units by mouth every Sunday. 04/24/19  [provider]    Allergies: Patient has no known allergies.    Review of Systems  Respiratory:  Negative for shortness of breath.   Cardiovascular:  Negative for chest pain, palpitations and syncope.  Gastrointestinal:  Negative for nausea and vomiting.  Neurological:  Positive for dizziness. Negative for headaches.    Updated Vital Signs BP (!) 141/97   Pulse 80   Temp 98.3 F (36.8 C) (Oral)   Resp 16   LMP  (LMP Unknown)   SpO2 99%   Physical Exam Vitals and nursing note reviewed.  Constitutional:      General: She is not in  acute distress.    Appearance: Normal appearance. She is well-developed.  HENT:     Head: Normocephalic and atraumatic.   Eyes:     Conjunctiva/sclera: Conjunctivae normal.    Cardiovascular:     Rate and Rhythm: Normal rate and regular rhythm.     Heart sounds: No murmur heard. Pulmonary:     Effort: Pulmonary effort is normal. No respiratory distress.     Breath sounds: Normal breath sounds. No stridor. No wheezing.  Abdominal:     Palpations: Abdomen is soft.     Tenderness: There is no abdominal tenderness. There is no guarding or rebound.   Musculoskeletal:        General: No tenderness or deformity. Normal range of motion.     Cervical back: Neck supple.   Skin:    General: Skin is warm and dry.   Neurological:     General: No focal deficit present.     Mental Status: She is alert.     GCS: GCS eye subscore is 4. GCS verbal subscore is 5. GCS motor subscore is 6.     Sensory: No sensory deficit.     Motor: No weakness.     (all labs ordered are listed, but only abnormal results are displayed) Labs Reviewed  COMPREHENSIVE METABOLIC PANEL WITH GFR  CBC  URINALYSIS, ROUTINE W REFLEX MICROSCOPIC  CBG MONITORING, ED  TROPONIN I (HIGH SENSITIVITY)    EKG: EKG Interpretation Date/Time:  Saturday July 28 2023 19:08:51 EDT Ventricular Rate:  75 PR Interval:  241 QRS Duration:  82 QT Interval:  391 QTC Calculation: 437 R Axis:   46  Text Interpretation: Sinus rhythm Prolonged PR interval Abnormal R-wave progression, early transition Consider left ventricular hypertrophy Borderline T abnormalities, inferior leads No significant change since prior 2/22 Confirmed by Towana Sharper [phone]) on 07/28/2023 7:13:03 PM  Radiology: No results found.  {Document cardiac monitor, telemetry assessment procedure when appropriate:32947} Procedures   Medications Ordered in the ED  sodium chloride  0.9 % bolus 500 mL (has no administration in time range)      {Click here  for ABCD2, HEART and other calculators REFRESH Note before signing:1}                              Medical Decision Making Amount and/or Complexity of Data Reviewed Labs: ordered.   This patient complains of ***; this involves an extensive number of treatment Options and is a complaint that carries with it a high risk of complications and morbidity. The differential includes ***  I ordered, reviewed and interpreted labs, which included *** I ordered medication *** and reviewed PMP when indicated. I ordered imaging studies which included *** and I independently    visualized and interpreted imaging which showed *** Additional history obtained from ***  Previous records obtained and reviewed *** I consulted *** and discussed lab and imaging findings and discussed disposition.  Cardiac monitoring reviewed, *** Social determinants considered, *** Critical Interventions: ***  After the interventions stated above, I reevaluated the patient and found *** Admission and further testing considered, ***   {Document critical care time when appropriate  Document review of labs and clinical decision tools ie CHADS2VASC2, etc  Document your independent review of radiology images and any outside records  Document your discussion with family members, caretakers and with consultants  Document social determinants of health affecting pt's care  Document your decision making why or why not admission, treatments were needed:32947:::1}   Final diagnoses:  None    ED Discharge Orders     None

## 2023-07-28 NOTE — Discharge Instructions (Signed)
 You were in the emergency department after experiencing an episode of dizziness and feeling faint.  You had blood work EKG and urinalysis that did not show any obvious explanation for your symptoms.  Your blood pressure was elevated here and this will need to be followed up with your primary care doctor.  Please continue your regular medications.  Return to the emergency department if any worsening or concerning symptoms.

## 2023-08-30 ENCOUNTER — Other Ambulatory Visit: Payer: Self-pay | Admitting: Family Medicine

## 2023-08-30 DIAGNOSIS — Z8673 Personal history of transient ischemic attack (TIA), and cerebral infarction without residual deficits: Secondary | ICD-10-CM

## 2023-09-03 ENCOUNTER — Ambulatory Visit
Admission: RE | Admit: 2023-09-03 | Discharge: 2023-09-03 | Disposition: A | Discharge status: Home / Self Care | Source: Ambulatory Visit | Attending: Family Medicine | Admitting: Family Medicine

## 2023-09-03 DIAGNOSIS — Z8673 Personal history of transient ischemic attack (TIA), and cerebral infarction without residual deficits: Secondary | ICD-10-CM
